# Patient Record
Sex: Female | Born: 1940 | Race: White | Hispanic: No | Smoking: Former smoker
Health system: Southern US, Community
[De-identification: ages and names within clinical notes are randomized; demographics above are authoritative.]

## PROBLEM LIST (undated history)

## (undated) DIAGNOSIS — E781 Pure hyperglyceridemia: Secondary | ICD-10-CM

## (undated) DIAGNOSIS — E119 Type 2 diabetes mellitus without complications: Secondary | ICD-10-CM

## (undated) DIAGNOSIS — I10 Essential (primary) hypertension: Secondary | ICD-10-CM

## (undated) DIAGNOSIS — I2510 Atherosclerotic heart disease of native coronary artery without angina pectoris: Secondary | ICD-10-CM

## (undated) DIAGNOSIS — J449 Chronic obstructive pulmonary disease, unspecified: Secondary | ICD-10-CM

## (undated) DIAGNOSIS — I219 Acute myocardial infarction, unspecified: Secondary | ICD-10-CM

## (undated) DIAGNOSIS — E785 Hyperlipidemia, unspecified: Secondary | ICD-10-CM

## (undated) DIAGNOSIS — I639 Cerebral infarction, unspecified: Secondary | ICD-10-CM

## (undated) HISTORY — PX: CHOLECYSTECTOMY: SHX55

## (undated) HISTORY — DX: Chronic obstructive pulmonary disease, unspecified: J44.9

## (undated) HISTORY — DX: Hyperlipidemia, unspecified: E78.5

## (undated) HISTORY — DX: Cerebral infarction, unspecified: I63.9

## (undated) HISTORY — PX: PARTIAL HYSTERECTOMY: SHX80

## (undated) HISTORY — PX: OTHER SURGICAL HISTORY: SHX169

## (undated) HISTORY — DX: Type 2 diabetes mellitus without complications: E11.9

## (undated) HISTORY — DX: Acute myocardial infarction, unspecified: I21.9

## (undated) HISTORY — DX: Pure hyperglyceridemia: E78.1

## (undated) HISTORY — PX: APPENDECTOMY: SHX54

## (undated) HISTORY — DX: Atherosclerotic heart disease of native coronary artery without angina pectoris: I25.10

## (undated) HISTORY — DX: Essential (primary) hypertension: I10

---

## 1969-08-30 HISTORY — PX: APPENDECTOMY: SHX54

## 1972-08-30 HISTORY — PX: TUBAL LIGATION: SHX77

## 1974-08-30 HISTORY — PX: PARTIAL HYSTERECTOMY: SHX80

## 1996-08-30 DIAGNOSIS — M109 Gout, unspecified: Secondary | ICD-10-CM | POA: Insufficient documentation

## 1996-08-30 HISTORY — DX: Gout, unspecified: M10.9

## 2005-08-30 HISTORY — PX: OTHER SURGICAL HISTORY: SHX169

## 2008-08-30 DIAGNOSIS — M1990 Unspecified osteoarthritis, unspecified site: Secondary | ICD-10-CM | POA: Insufficient documentation

## 2008-08-30 HISTORY — DX: Unspecified osteoarthritis, unspecified site: M19.90

## 2011-09-07 DIAGNOSIS — I2510 Atherosclerotic heart disease of native coronary artery without angina pectoris: Secondary | ICD-10-CM | POA: Diagnosis not present

## 2011-10-08 DIAGNOSIS — H2510 Age-related nuclear cataract, unspecified eye: Secondary | ICD-10-CM | POA: Diagnosis not present

## 2011-11-09 DIAGNOSIS — M109 Gout, unspecified: Secondary | ICD-10-CM | POA: Diagnosis not present

## 2011-11-09 DIAGNOSIS — IMO0001 Reserved for inherently not codable concepts without codable children: Secondary | ICD-10-CM | POA: Diagnosis not present

## 2011-11-09 DIAGNOSIS — I119 Hypertensive heart disease without heart failure: Secondary | ICD-10-CM | POA: Diagnosis not present

## 2011-11-09 DIAGNOSIS — I2510 Atherosclerotic heart disease of native coronary artery without angina pectoris: Secondary | ICD-10-CM | POA: Diagnosis not present

## 2011-11-09 DIAGNOSIS — E785 Hyperlipidemia, unspecified: Secondary | ICD-10-CM | POA: Diagnosis not present

## 2011-11-09 DIAGNOSIS — E119 Type 2 diabetes mellitus without complications: Secondary | ICD-10-CM | POA: Diagnosis not present

## 2011-11-09 DIAGNOSIS — I6529 Occlusion and stenosis of unspecified carotid artery: Secondary | ICD-10-CM | POA: Diagnosis not present

## 2011-11-09 DIAGNOSIS — I509 Heart failure, unspecified: Secondary | ICD-10-CM | POA: Diagnosis not present

## 2011-11-23 DIAGNOSIS — I119 Hypertensive heart disease without heart failure: Secondary | ICD-10-CM | POA: Diagnosis not present

## 2011-11-23 DIAGNOSIS — I509 Heart failure, unspecified: Secondary | ICD-10-CM | POA: Diagnosis not present

## 2011-11-23 DIAGNOSIS — I6529 Occlusion and stenosis of unspecified carotid artery: Secondary | ICD-10-CM | POA: Diagnosis not present

## 2011-11-23 DIAGNOSIS — I2510 Atherosclerotic heart disease of native coronary artery without angina pectoris: Secondary | ICD-10-CM | POA: Diagnosis not present

## 2011-11-23 DIAGNOSIS — J309 Allergic rhinitis, unspecified: Secondary | ICD-10-CM | POA: Diagnosis not present

## 2011-11-23 DIAGNOSIS — N39498 Other specified urinary incontinence: Secondary | ICD-10-CM | POA: Diagnosis not present

## 2011-12-15 DIAGNOSIS — K7689 Other specified diseases of liver: Secondary | ICD-10-CM | POA: Diagnosis not present

## 2011-12-15 DIAGNOSIS — I700 Atherosclerosis of aorta: Secondary | ICD-10-CM | POA: Diagnosis not present

## 2011-12-15 DIAGNOSIS — R160 Hepatomegaly, not elsewhere classified: Secondary | ICD-10-CM | POA: Diagnosis not present

## 2011-12-15 DIAGNOSIS — Z9089 Acquired absence of other organs: Secondary | ICD-10-CM | POA: Diagnosis not present

## 2012-01-25 DIAGNOSIS — E785 Hyperlipidemia, unspecified: Secondary | ICD-10-CM | POA: Diagnosis not present

## 2012-01-25 DIAGNOSIS — E119 Type 2 diabetes mellitus without complications: Secondary | ICD-10-CM | POA: Diagnosis not present

## 2012-01-25 DIAGNOSIS — E7800 Pure hypercholesterolemia, unspecified: Secondary | ICD-10-CM | POA: Diagnosis not present

## 2012-02-29 DIAGNOSIS — E7800 Pure hypercholesterolemia, unspecified: Secondary | ICD-10-CM | POA: Diagnosis not present

## 2012-02-29 DIAGNOSIS — E785 Hyperlipidemia, unspecified: Secondary | ICD-10-CM | POA: Diagnosis not present

## 2012-02-29 DIAGNOSIS — I119 Hypertensive heart disease without heart failure: Secondary | ICD-10-CM | POA: Diagnosis not present

## 2012-02-29 DIAGNOSIS — E119 Type 2 diabetes mellitus without complications: Secondary | ICD-10-CM | POA: Diagnosis not present

## 2012-02-29 DIAGNOSIS — I2510 Atherosclerotic heart disease of native coronary artery without angina pectoris: Secondary | ICD-10-CM | POA: Diagnosis not present

## 2012-02-29 DIAGNOSIS — R7309 Other abnormal glucose: Secondary | ICD-10-CM | POA: Diagnosis not present

## 2012-02-29 DIAGNOSIS — I509 Heart failure, unspecified: Secondary | ICD-10-CM | POA: Diagnosis not present

## 2012-02-29 DIAGNOSIS — E559 Vitamin D deficiency, unspecified: Secondary | ICD-10-CM | POA: Diagnosis not present

## 2012-02-29 DIAGNOSIS — I6529 Occlusion and stenosis of unspecified carotid artery: Secondary | ICD-10-CM | POA: Diagnosis not present

## 2012-03-14 DIAGNOSIS — I6529 Occlusion and stenosis of unspecified carotid artery: Secondary | ICD-10-CM | POA: Diagnosis not present

## 2012-03-14 DIAGNOSIS — I509 Heart failure, unspecified: Secondary | ICD-10-CM | POA: Diagnosis not present

## 2012-03-14 DIAGNOSIS — J309 Allergic rhinitis, unspecified: Secondary | ICD-10-CM | POA: Diagnosis not present

## 2012-03-14 DIAGNOSIS — N39498 Other specified urinary incontinence: Secondary | ICD-10-CM | POA: Diagnosis not present

## 2012-03-14 DIAGNOSIS — I2510 Atherosclerotic heart disease of native coronary artery without angina pectoris: Secondary | ICD-10-CM | POA: Diagnosis not present

## 2012-03-14 DIAGNOSIS — I119 Hypertensive heart disease without heart failure: Secondary | ICD-10-CM | POA: Diagnosis not present

## 2012-04-04 DIAGNOSIS — E781 Pure hyperglyceridemia: Secondary | ICD-10-CM | POA: Diagnosis not present

## 2012-05-11 DIAGNOSIS — M109 Gout, unspecified: Secondary | ICD-10-CM | POA: Diagnosis not present

## 2012-05-11 DIAGNOSIS — E559 Vitamin D deficiency, unspecified: Secondary | ICD-10-CM | POA: Diagnosis not present

## 2012-05-11 DIAGNOSIS — R7309 Other abnormal glucose: Secondary | ICD-10-CM | POA: Diagnosis not present

## 2012-07-10 DIAGNOSIS — E781 Pure hyperglyceridemia: Secondary | ICD-10-CM | POA: Diagnosis not present

## 2012-09-12 DIAGNOSIS — E785 Hyperlipidemia, unspecified: Secondary | ICD-10-CM | POA: Diagnosis not present

## 2012-09-12 DIAGNOSIS — E119 Type 2 diabetes mellitus without complications: Secondary | ICD-10-CM | POA: Diagnosis not present

## 2012-09-12 DIAGNOSIS — I6529 Occlusion and stenosis of unspecified carotid artery: Secondary | ICD-10-CM | POA: Diagnosis not present

## 2012-09-12 DIAGNOSIS — E559 Vitamin D deficiency, unspecified: Secondary | ICD-10-CM | POA: Diagnosis not present

## 2012-09-12 DIAGNOSIS — I509 Heart failure, unspecified: Secondary | ICD-10-CM | POA: Diagnosis not present

## 2012-09-12 DIAGNOSIS — K5730 Diverticulosis of large intestine without perforation or abscess without bleeding: Secondary | ICD-10-CM | POA: Diagnosis not present

## 2012-09-12 DIAGNOSIS — I2510 Atherosclerotic heart disease of native coronary artery without angina pectoris: Secondary | ICD-10-CM | POA: Diagnosis not present

## 2012-09-12 DIAGNOSIS — E7800 Pure hypercholesterolemia, unspecified: Secondary | ICD-10-CM | POA: Diagnosis not present

## 2012-10-20 DIAGNOSIS — H2510 Age-related nuclear cataract, unspecified eye: Secondary | ICD-10-CM | POA: Diagnosis not present

## 2012-11-07 DIAGNOSIS — I119 Hypertensive heart disease without heart failure: Secondary | ICD-10-CM | POA: Diagnosis not present

## 2012-11-07 DIAGNOSIS — I509 Heart failure, unspecified: Secondary | ICD-10-CM | POA: Diagnosis not present

## 2012-11-07 DIAGNOSIS — J309 Allergic rhinitis, unspecified: Secondary | ICD-10-CM | POA: Diagnosis not present

## 2012-11-07 DIAGNOSIS — N39498 Other specified urinary incontinence: Secondary | ICD-10-CM | POA: Diagnosis not present

## 2012-11-07 DIAGNOSIS — I2510 Atherosclerotic heart disease of native coronary artery without angina pectoris: Secondary | ICD-10-CM | POA: Diagnosis not present

## 2012-11-07 DIAGNOSIS — I6529 Occlusion and stenosis of unspecified carotid artery: Secondary | ICD-10-CM | POA: Diagnosis not present

## 2012-11-15 DIAGNOSIS — E781 Pure hyperglyceridemia: Secondary | ICD-10-CM | POA: Diagnosis not present

## 2013-02-13 DIAGNOSIS — E119 Type 2 diabetes mellitus without complications: Secondary | ICD-10-CM | POA: Diagnosis not present

## 2013-02-13 DIAGNOSIS — E559 Vitamin D deficiency, unspecified: Secondary | ICD-10-CM | POA: Diagnosis not present

## 2013-02-13 DIAGNOSIS — I119 Hypertensive heart disease without heart failure: Secondary | ICD-10-CM | POA: Diagnosis not present

## 2013-02-13 DIAGNOSIS — I509 Heart failure, unspecified: Secondary | ICD-10-CM | POA: Diagnosis not present

## 2013-02-13 DIAGNOSIS — M109 Gout, unspecified: Secondary | ICD-10-CM | POA: Diagnosis not present

## 2013-02-13 DIAGNOSIS — E7800 Pure hypercholesterolemia, unspecified: Secondary | ICD-10-CM | POA: Diagnosis not present

## 2013-02-27 DIAGNOSIS — I119 Hypertensive heart disease without heart failure: Secondary | ICD-10-CM | POA: Diagnosis not present

## 2013-02-27 DIAGNOSIS — I509 Heart failure, unspecified: Secondary | ICD-10-CM | POA: Diagnosis not present

## 2013-02-27 DIAGNOSIS — J309 Allergic rhinitis, unspecified: Secondary | ICD-10-CM | POA: Diagnosis not present

## 2013-02-27 DIAGNOSIS — I6529 Occlusion and stenosis of unspecified carotid artery: Secondary | ICD-10-CM | POA: Diagnosis not present

## 2013-02-27 DIAGNOSIS — M1990 Unspecified osteoarthritis, unspecified site: Secondary | ICD-10-CM | POA: Diagnosis not present

## 2013-02-27 DIAGNOSIS — I2510 Atherosclerotic heart disease of native coronary artery without angina pectoris: Secondary | ICD-10-CM | POA: Diagnosis not present

## 2013-03-16 DIAGNOSIS — E781 Pure hyperglyceridemia: Secondary | ICD-10-CM | POA: Diagnosis not present

## 2013-06-05 DIAGNOSIS — R7309 Other abnormal glucose: Secondary | ICD-10-CM | POA: Diagnosis not present

## 2013-06-05 DIAGNOSIS — E559 Vitamin D deficiency, unspecified: Secondary | ICD-10-CM | POA: Diagnosis not present

## 2013-06-05 DIAGNOSIS — E119 Type 2 diabetes mellitus without complications: Secondary | ICD-10-CM | POA: Diagnosis not present

## 2013-06-05 DIAGNOSIS — M109 Gout, unspecified: Secondary | ICD-10-CM | POA: Diagnosis not present

## 2013-06-05 DIAGNOSIS — I509 Heart failure, unspecified: Secondary | ICD-10-CM | POA: Diagnosis not present

## 2013-06-05 DIAGNOSIS — M899 Disorder of bone, unspecified: Secondary | ICD-10-CM | POA: Diagnosis not present

## 2013-06-05 DIAGNOSIS — I6529 Occlusion and stenosis of unspecified carotid artery: Secondary | ICD-10-CM | POA: Diagnosis not present

## 2013-06-05 DIAGNOSIS — E785 Hyperlipidemia, unspecified: Secondary | ICD-10-CM | POA: Diagnosis not present

## 2013-06-05 DIAGNOSIS — I2510 Atherosclerotic heart disease of native coronary artery without angina pectoris: Secondary | ICD-10-CM | POA: Diagnosis not present

## 2013-06-19 DIAGNOSIS — J309 Allergic rhinitis, unspecified: Secondary | ICD-10-CM | POA: Diagnosis not present

## 2013-06-19 DIAGNOSIS — I119 Hypertensive heart disease without heart failure: Secondary | ICD-10-CM | POA: Diagnosis not present

## 2013-06-19 DIAGNOSIS — Z23 Encounter for immunization: Secondary | ICD-10-CM | POA: Diagnosis not present

## 2013-06-19 DIAGNOSIS — I509 Heart failure, unspecified: Secondary | ICD-10-CM | POA: Diagnosis not present

## 2013-06-19 DIAGNOSIS — I2510 Atherosclerotic heart disease of native coronary artery without angina pectoris: Secondary | ICD-10-CM | POA: Diagnosis not present

## 2013-06-19 DIAGNOSIS — E785 Hyperlipidemia, unspecified: Secondary | ICD-10-CM | POA: Diagnosis not present

## 2013-06-19 DIAGNOSIS — I6529 Occlusion and stenosis of unspecified carotid artery: Secondary | ICD-10-CM | POA: Diagnosis not present

## 2013-06-19 DIAGNOSIS — R32 Unspecified urinary incontinence: Secondary | ICD-10-CM | POA: Diagnosis not present

## 2013-11-27 DIAGNOSIS — H5200 Hypermetropia, unspecified eye: Secondary | ICD-10-CM | POA: Diagnosis not present

## 2013-11-27 DIAGNOSIS — H524 Presbyopia: Secondary | ICD-10-CM | POA: Diagnosis not present

## 2013-11-27 DIAGNOSIS — E119 Type 2 diabetes mellitus without complications: Secondary | ICD-10-CM | POA: Diagnosis not present

## 2013-11-27 DIAGNOSIS — H52229 Regular astigmatism, unspecified eye: Secondary | ICD-10-CM | POA: Diagnosis not present

## 2013-11-28 DIAGNOSIS — I10 Essential (primary) hypertension: Secondary | ICD-10-CM | POA: Diagnosis not present

## 2013-11-28 DIAGNOSIS — F17200 Nicotine dependence, unspecified, uncomplicated: Secondary | ICD-10-CM | POA: Diagnosis not present

## 2013-11-28 DIAGNOSIS — E785 Hyperlipidemia, unspecified: Secondary | ICD-10-CM | POA: Diagnosis not present

## 2013-11-28 DIAGNOSIS — E119 Type 2 diabetes mellitus without complications: Secondary | ICD-10-CM | POA: Diagnosis not present

## 2013-11-28 DIAGNOSIS — I2510 Atherosclerotic heart disease of native coronary artery without angina pectoris: Secondary | ICD-10-CM | POA: Diagnosis not present

## 2014-02-27 DIAGNOSIS — F17200 Nicotine dependence, unspecified, uncomplicated: Secondary | ICD-10-CM | POA: Diagnosis not present

## 2014-02-27 DIAGNOSIS — E119 Type 2 diabetes mellitus without complications: Secondary | ICD-10-CM | POA: Diagnosis not present

## 2014-02-27 DIAGNOSIS — I10 Essential (primary) hypertension: Secondary | ICD-10-CM | POA: Diagnosis not present

## 2014-02-27 DIAGNOSIS — E785 Hyperlipidemia, unspecified: Secondary | ICD-10-CM | POA: Diagnosis not present

## 2014-06-04 DIAGNOSIS — I10 Essential (primary) hypertension: Secondary | ICD-10-CM | POA: Diagnosis not present

## 2014-06-04 DIAGNOSIS — L449 Papulosquamous disorder, unspecified: Secondary | ICD-10-CM | POA: Diagnosis not present

## 2014-06-04 DIAGNOSIS — R21 Rash and other nonspecific skin eruption: Secondary | ICD-10-CM | POA: Diagnosis not present

## 2014-06-04 DIAGNOSIS — L02419 Cutaneous abscess of limb, unspecified: Secondary | ICD-10-CM | POA: Diagnosis not present

## 2014-06-04 DIAGNOSIS — E119 Type 2 diabetes mellitus without complications: Secondary | ICD-10-CM | POA: Diagnosis not present

## 2014-06-04 DIAGNOSIS — Z23 Encounter for immunization: Secondary | ICD-10-CM | POA: Diagnosis not present

## 2014-06-18 DIAGNOSIS — I10 Essential (primary) hypertension: Secondary | ICD-10-CM | POA: Diagnosis not present

## 2014-06-18 DIAGNOSIS — L280 Lichen simplex chronicus: Secondary | ICD-10-CM | POA: Diagnosis not present

## 2014-07-15 DIAGNOSIS — I10 Essential (primary) hypertension: Secondary | ICD-10-CM | POA: Diagnosis not present

## 2014-09-04 DIAGNOSIS — E119 Type 2 diabetes mellitus without complications: Secondary | ICD-10-CM | POA: Diagnosis not present

## 2014-09-04 DIAGNOSIS — Z720 Tobacco use: Secondary | ICD-10-CM | POA: Diagnosis not present

## 2014-09-04 DIAGNOSIS — I10 Essential (primary) hypertension: Secondary | ICD-10-CM | POA: Diagnosis not present

## 2014-09-10 DIAGNOSIS — I10 Essential (primary) hypertension: Secondary | ICD-10-CM | POA: Diagnosis not present

## 2014-09-10 DIAGNOSIS — E785 Hyperlipidemia, unspecified: Secondary | ICD-10-CM | POA: Diagnosis not present

## 2014-12-04 DIAGNOSIS — E119 Type 2 diabetes mellitus without complications: Secondary | ICD-10-CM | POA: Diagnosis not present

## 2014-12-04 DIAGNOSIS — I10 Essential (primary) hypertension: Secondary | ICD-10-CM | POA: Diagnosis not present

## 2014-12-04 DIAGNOSIS — J309 Allergic rhinitis, unspecified: Secondary | ICD-10-CM | POA: Diagnosis not present

## 2014-12-04 DIAGNOSIS — R21 Rash and other nonspecific skin eruption: Secondary | ICD-10-CM | POA: Diagnosis not present

## 2015-03-12 DIAGNOSIS — E780 Pure hypercholesterolemia: Secondary | ICD-10-CM | POA: Diagnosis not present

## 2015-03-12 DIAGNOSIS — I10 Essential (primary) hypertension: Secondary | ICD-10-CM | POA: Diagnosis not present

## 2015-03-12 DIAGNOSIS — J449 Chronic obstructive pulmonary disease, unspecified: Secondary | ICD-10-CM | POA: Diagnosis not present

## 2015-03-12 DIAGNOSIS — E785 Hyperlipidemia, unspecified: Secondary | ICD-10-CM | POA: Diagnosis not present

## 2015-03-12 DIAGNOSIS — E119 Type 2 diabetes mellitus without complications: Secondary | ICD-10-CM | POA: Diagnosis not present

## 2015-03-17 DIAGNOSIS — E119 Type 2 diabetes mellitus without complications: Secondary | ICD-10-CM | POA: Diagnosis not present

## 2015-06-11 DIAGNOSIS — Z23 Encounter for immunization: Secondary | ICD-10-CM | POA: Diagnosis not present

## 2015-06-11 DIAGNOSIS — J309 Allergic rhinitis, unspecified: Secondary | ICD-10-CM | POA: Diagnosis not present

## 2015-06-11 DIAGNOSIS — E119 Type 2 diabetes mellitus without complications: Secondary | ICD-10-CM | POA: Diagnosis not present

## 2015-06-11 DIAGNOSIS — E785 Hyperlipidemia, unspecified: Secondary | ICD-10-CM | POA: Diagnosis not present

## 2015-06-11 DIAGNOSIS — I10 Essential (primary) hypertension: Secondary | ICD-10-CM | POA: Diagnosis not present

## 2015-06-11 DIAGNOSIS — Z720 Tobacco use: Secondary | ICD-10-CM | POA: Diagnosis not present

## 2015-06-17 DIAGNOSIS — J309 Allergic rhinitis, unspecified: Secondary | ICD-10-CM | POA: Diagnosis not present

## 2015-07-15 DIAGNOSIS — E785 Hyperlipidemia, unspecified: Secondary | ICD-10-CM | POA: Diagnosis not present

## 2015-07-15 DIAGNOSIS — I10 Essential (primary) hypertension: Secondary | ICD-10-CM | POA: Diagnosis not present

## 2015-07-15 DIAGNOSIS — Z87891 Personal history of nicotine dependence: Secondary | ICD-10-CM | POA: Diagnosis not present

## 2015-07-15 DIAGNOSIS — Z720 Tobacco use: Secondary | ICD-10-CM | POA: Diagnosis not present

## 2015-07-15 DIAGNOSIS — E119 Type 2 diabetes mellitus without complications: Secondary | ICD-10-CM | POA: Diagnosis not present

## 2015-07-15 DIAGNOSIS — J309 Allergic rhinitis, unspecified: Secondary | ICD-10-CM | POA: Diagnosis not present

## 2015-09-23 DIAGNOSIS — E119 Type 2 diabetes mellitus without complications: Secondary | ICD-10-CM | POA: Diagnosis not present

## 2015-09-23 DIAGNOSIS — E785 Hyperlipidemia, unspecified: Secondary | ICD-10-CM | POA: Diagnosis not present

## 2015-09-23 DIAGNOSIS — R160 Hepatomegaly, not elsewhere classified: Secondary | ICD-10-CM | POA: Diagnosis not present

## 2015-09-23 DIAGNOSIS — I10 Essential (primary) hypertension: Secondary | ICD-10-CM | POA: Diagnosis not present

## 2015-09-30 DIAGNOSIS — I700 Atherosclerosis of aorta: Secondary | ICD-10-CM | POA: Diagnosis not present

## 2015-09-30 DIAGNOSIS — K5730 Diverticulosis of large intestine without perforation or abscess without bleeding: Secondary | ICD-10-CM | POA: Diagnosis not present

## 2015-09-30 DIAGNOSIS — R935 Abnormal findings on diagnostic imaging of other abdominal regions, including retroperitoneum: Secondary | ICD-10-CM | POA: Diagnosis not present

## 2015-09-30 DIAGNOSIS — R160 Hepatomegaly, not elsewhere classified: Secondary | ICD-10-CM | POA: Diagnosis not present

## 2015-12-22 DIAGNOSIS — I2510 Atherosclerotic heart disease of native coronary artery without angina pectoris: Secondary | ICD-10-CM | POA: Diagnosis not present

## 2015-12-22 DIAGNOSIS — E119 Type 2 diabetes mellitus without complications: Secondary | ICD-10-CM | POA: Diagnosis not present

## 2015-12-22 DIAGNOSIS — E785 Hyperlipidemia, unspecified: Secondary | ICD-10-CM | POA: Diagnosis not present

## 2015-12-22 DIAGNOSIS — I10 Essential (primary) hypertension: Secondary | ICD-10-CM | POA: Diagnosis not present

## 2015-12-29 DIAGNOSIS — Z9114 Patient's other noncompliance with medication regimen: Secondary | ICD-10-CM | POA: Diagnosis not present

## 2016-03-17 DIAGNOSIS — E119 Type 2 diabetes mellitus without complications: Secondary | ICD-10-CM | POA: Diagnosis not present

## 2016-03-17 DIAGNOSIS — H524 Presbyopia: Secondary | ICD-10-CM | POA: Diagnosis not present

## 2016-03-17 DIAGNOSIS — H25813 Combined forms of age-related cataract, bilateral: Secondary | ICD-10-CM | POA: Diagnosis not present

## 2016-03-22 DIAGNOSIS — E119 Type 2 diabetes mellitus without complications: Secondary | ICD-10-CM | POA: Diagnosis not present

## 2016-03-22 DIAGNOSIS — M542 Cervicalgia: Secondary | ICD-10-CM | POA: Diagnosis not present

## 2016-03-22 DIAGNOSIS — M25512 Pain in left shoulder: Secondary | ICD-10-CM | POA: Diagnosis not present

## 2016-03-22 DIAGNOSIS — I10 Essential (primary) hypertension: Secondary | ICD-10-CM | POA: Diagnosis not present

## 2016-03-22 DIAGNOSIS — E785 Hyperlipidemia, unspecified: Secondary | ICD-10-CM | POA: Diagnosis not present

## 2016-03-23 DIAGNOSIS — M542 Cervicalgia: Secondary | ICD-10-CM | POA: Diagnosis not present

## 2016-03-23 DIAGNOSIS — M47812 Spondylosis without myelopathy or radiculopathy, cervical region: Secondary | ICD-10-CM | POA: Diagnosis not present

## 2016-04-23 ENCOUNTER — Other Ambulatory Visit: Payer: Self-pay

## 2016-06-22 DIAGNOSIS — Z794 Long term (current) use of insulin: Secondary | ICD-10-CM | POA: Diagnosis not present

## 2016-06-22 DIAGNOSIS — Z1389 Encounter for screening for other disorder: Secondary | ICD-10-CM | POA: Diagnosis not present

## 2016-06-22 DIAGNOSIS — I10 Essential (primary) hypertension: Secondary | ICD-10-CM | POA: Diagnosis not present

## 2016-06-22 DIAGNOSIS — E785 Hyperlipidemia, unspecified: Secondary | ICD-10-CM | POA: Diagnosis not present

## 2016-06-22 DIAGNOSIS — Z6830 Body mass index (BMI) 30.0-30.9, adult: Secondary | ICD-10-CM | POA: Diagnosis not present

## 2016-06-22 DIAGNOSIS — E790 Hyperuricemia without signs of inflammatory arthritis and tophaceous disease: Secondary | ICD-10-CM | POA: Diagnosis not present

## 2016-06-22 DIAGNOSIS — E119 Type 2 diabetes mellitus without complications: Secondary | ICD-10-CM | POA: Diagnosis not present

## 2016-06-22 DIAGNOSIS — Z23 Encounter for immunization: Secondary | ICD-10-CM | POA: Diagnosis not present

## 2016-06-22 DIAGNOSIS — M542 Cervicalgia: Secondary | ICD-10-CM | POA: Diagnosis not present

## 2016-09-22 DIAGNOSIS — E790 Hyperuricemia without signs of inflammatory arthritis and tophaceous disease: Secondary | ICD-10-CM | POA: Diagnosis not present

## 2016-09-22 DIAGNOSIS — I10 Essential (primary) hypertension: Secondary | ICD-10-CM | POA: Diagnosis not present

## 2016-09-22 DIAGNOSIS — E119 Type 2 diabetes mellitus without complications: Secondary | ICD-10-CM | POA: Diagnosis not present

## 2016-09-22 DIAGNOSIS — M542 Cervicalgia: Secondary | ICD-10-CM | POA: Diagnosis not present

## 2016-12-22 DIAGNOSIS — R600 Localized edema: Secondary | ICD-10-CM | POA: Diagnosis not present

## 2016-12-22 DIAGNOSIS — E790 Hyperuricemia without signs of inflammatory arthritis and tophaceous disease: Secondary | ICD-10-CM | POA: Diagnosis not present

## 2016-12-22 DIAGNOSIS — E785 Hyperlipidemia, unspecified: Secondary | ICD-10-CM | POA: Diagnosis not present

## 2016-12-22 DIAGNOSIS — E119 Type 2 diabetes mellitus without complications: Secondary | ICD-10-CM | POA: Diagnosis not present

## 2016-12-22 DIAGNOSIS — I10 Essential (primary) hypertension: Secondary | ICD-10-CM | POA: Diagnosis not present

## 2016-12-22 DIAGNOSIS — Z794 Long term (current) use of insulin: Secondary | ICD-10-CM | POA: Diagnosis not present

## 2016-12-29 DIAGNOSIS — R748 Abnormal levels of other serum enzymes: Secondary | ICD-10-CM | POA: Diagnosis not present

## 2017-03-22 DIAGNOSIS — H25813 Combined forms of age-related cataract, bilateral: Secondary | ICD-10-CM | POA: Diagnosis not present

## 2017-03-22 DIAGNOSIS — E119 Type 2 diabetes mellitus without complications: Secondary | ICD-10-CM | POA: Diagnosis not present

## 2017-03-28 DIAGNOSIS — I10 Essential (primary) hypertension: Secondary | ICD-10-CM | POA: Diagnosis not present

## 2017-03-28 DIAGNOSIS — E785 Hyperlipidemia, unspecified: Secondary | ICD-10-CM | POA: Diagnosis not present

## 2017-03-28 DIAGNOSIS — E119 Type 2 diabetes mellitus without complications: Secondary | ICD-10-CM | POA: Diagnosis not present

## 2017-03-28 DIAGNOSIS — M109 Gout, unspecified: Secondary | ICD-10-CM | POA: Diagnosis not present

## 2017-06-07 DIAGNOSIS — Z23 Encounter for immunization: Secondary | ICD-10-CM | POA: Diagnosis not present

## 2017-06-29 DIAGNOSIS — I10 Essential (primary) hypertension: Secondary | ICD-10-CM | POA: Diagnosis not present

## 2017-06-29 DIAGNOSIS — E119 Type 2 diabetes mellitus without complications: Secondary | ICD-10-CM | POA: Diagnosis not present

## 2017-06-29 DIAGNOSIS — J449 Chronic obstructive pulmonary disease, unspecified: Secondary | ICD-10-CM | POA: Diagnosis not present

## 2017-06-29 DIAGNOSIS — E785 Hyperlipidemia, unspecified: Secondary | ICD-10-CM | POA: Diagnosis not present

## 2017-06-29 DIAGNOSIS — Z794 Long term (current) use of insulin: Secondary | ICD-10-CM | POA: Diagnosis not present

## 2017-07-12 DIAGNOSIS — E781 Pure hyperglyceridemia: Secondary | ICD-10-CM | POA: Diagnosis not present

## 2017-09-30 DIAGNOSIS — J449 Chronic obstructive pulmonary disease, unspecified: Secondary | ICD-10-CM | POA: Diagnosis not present

## 2017-09-30 DIAGNOSIS — E785 Hyperlipidemia, unspecified: Secondary | ICD-10-CM | POA: Diagnosis not present

## 2017-09-30 DIAGNOSIS — Z720 Tobacco use: Secondary | ICD-10-CM | POA: Diagnosis not present

## 2017-09-30 DIAGNOSIS — E119 Type 2 diabetes mellitus without complications: Secondary | ICD-10-CM | POA: Diagnosis not present

## 2017-09-30 DIAGNOSIS — I10 Essential (primary) hypertension: Secondary | ICD-10-CM | POA: Diagnosis not present

## 2017-10-14 DIAGNOSIS — E119 Type 2 diabetes mellitus without complications: Secondary | ICD-10-CM | POA: Diagnosis not present

## 2017-12-28 DIAGNOSIS — Z720 Tobacco use: Secondary | ICD-10-CM | POA: Diagnosis not present

## 2017-12-28 DIAGNOSIS — Z6829 Body mass index (BMI) 29.0-29.9, adult: Secondary | ICD-10-CM | POA: Diagnosis not present

## 2017-12-28 DIAGNOSIS — M545 Low back pain: Secondary | ICD-10-CM | POA: Diagnosis not present

## 2017-12-28 DIAGNOSIS — I10 Essential (primary) hypertension: Secondary | ICD-10-CM | POA: Diagnosis not present

## 2017-12-28 DIAGNOSIS — J449 Chronic obstructive pulmonary disease, unspecified: Secondary | ICD-10-CM | POA: Diagnosis not present

## 2017-12-28 DIAGNOSIS — E119 Type 2 diabetes mellitus without complications: Secondary | ICD-10-CM | POA: Diagnosis not present

## 2017-12-28 DIAGNOSIS — Z1389 Encounter for screening for other disorder: Secondary | ICD-10-CM | POA: Diagnosis not present

## 2017-12-29 DIAGNOSIS — M5126 Other intervertebral disc displacement, lumbar region: Secondary | ICD-10-CM | POA: Diagnosis not present

## 2017-12-29 DIAGNOSIS — M48061 Spinal stenosis, lumbar region without neurogenic claudication: Secondary | ICD-10-CM | POA: Diagnosis not present

## 2017-12-29 DIAGNOSIS — M545 Low back pain: Secondary | ICD-10-CM | POA: Diagnosis not present

## 2017-12-29 DIAGNOSIS — R102 Pelvic and perineal pain: Secondary | ICD-10-CM | POA: Diagnosis not present

## 2018-01-16 DIAGNOSIS — I10 Essential (primary) hypertension: Secondary | ICD-10-CM | POA: Diagnosis not present

## 2018-01-16 DIAGNOSIS — S336XXD Sprain of sacroiliac joint, subsequent encounter: Secondary | ICD-10-CM | POA: Diagnosis not present

## 2018-02-01 DIAGNOSIS — E119 Type 2 diabetes mellitus without complications: Secondary | ICD-10-CM | POA: Insufficient documentation

## 2018-02-01 DIAGNOSIS — Z794 Long term (current) use of insulin: Secondary | ICD-10-CM | POA: Diagnosis not present

## 2018-02-01 DIAGNOSIS — M2140 Flat foot [pes planus] (acquired), unspecified foot: Secondary | ICD-10-CM

## 2018-02-01 DIAGNOSIS — E118 Type 2 diabetes mellitus with unspecified complications: Secondary | ICD-10-CM

## 2018-02-01 DIAGNOSIS — M2141 Flat foot [pes planus] (acquired), right foot: Secondary | ICD-10-CM | POA: Diagnosis not present

## 2018-02-01 DIAGNOSIS — M2142 Flat foot [pes planus] (acquired), left foot: Secondary | ICD-10-CM | POA: Diagnosis not present

## 2018-02-01 DIAGNOSIS — E1169 Type 2 diabetes mellitus with other specified complication: Secondary | ICD-10-CM

## 2018-02-01 DIAGNOSIS — M19071 Primary osteoarthritis, right ankle and foot: Secondary | ICD-10-CM

## 2018-02-01 DIAGNOSIS — M19072 Primary osteoarthritis, left ankle and foot: Secondary | ICD-10-CM | POA: Diagnosis not present

## 2018-02-01 HISTORY — DX: Type 2 diabetes mellitus with other specified complication: E11.69

## 2018-02-01 HISTORY — DX: Primary osteoarthritis, right ankle and foot: M19.071

## 2018-02-01 HISTORY — DX: Flat foot (pes planus) (acquired), unspecified foot: M21.40

## 2018-02-01 HISTORY — DX: Type 2 diabetes mellitus with unspecified complications: E11.8

## 2018-03-29 ENCOUNTER — Other Ambulatory Visit: Payer: Self-pay

## 2018-03-31 DIAGNOSIS — E785 Hyperlipidemia, unspecified: Secondary | ICD-10-CM | POA: Diagnosis not present

## 2018-03-31 DIAGNOSIS — I10 Essential (primary) hypertension: Secondary | ICD-10-CM | POA: Diagnosis not present

## 2018-03-31 DIAGNOSIS — E119 Type 2 diabetes mellitus without complications: Secondary | ICD-10-CM | POA: Diagnosis not present

## 2018-04-14 DIAGNOSIS — H25813 Combined forms of age-related cataract, bilateral: Secondary | ICD-10-CM | POA: Diagnosis not present

## 2018-04-14 DIAGNOSIS — H353111 Nonexudative age-related macular degeneration, right eye, early dry stage: Secondary | ICD-10-CM | POA: Diagnosis not present

## 2018-05-09 DIAGNOSIS — E785 Hyperlipidemia, unspecified: Secondary | ICD-10-CM | POA: Diagnosis not present

## 2018-05-09 DIAGNOSIS — Z23 Encounter for immunization: Secondary | ICD-10-CM | POA: Diagnosis not present

## 2018-05-09 DIAGNOSIS — Z794 Long term (current) use of insulin: Secondary | ICD-10-CM | POA: Diagnosis not present

## 2018-05-09 DIAGNOSIS — E119 Type 2 diabetes mellitus without complications: Secondary | ICD-10-CM | POA: Diagnosis not present

## 2018-05-09 DIAGNOSIS — I10 Essential (primary) hypertension: Secondary | ICD-10-CM | POA: Diagnosis not present

## 2018-05-16 DIAGNOSIS — R899 Unspecified abnormal finding in specimens from other organs, systems and tissues: Secondary | ICD-10-CM | POA: Diagnosis not present

## 2018-05-30 DIAGNOSIS — R899 Unspecified abnormal finding in specimens from other organs, systems and tissues: Secondary | ICD-10-CM | POA: Diagnosis not present

## 2018-07-03 DIAGNOSIS — E782 Mixed hyperlipidemia: Secondary | ICD-10-CM | POA: Diagnosis not present

## 2018-07-03 DIAGNOSIS — E1169 Type 2 diabetes mellitus with other specified complication: Secondary | ICD-10-CM | POA: Diagnosis not present

## 2018-07-03 DIAGNOSIS — I10 Essential (primary) hypertension: Secondary | ICD-10-CM | POA: Diagnosis not present

## 2018-10-04 DIAGNOSIS — E782 Mixed hyperlipidemia: Secondary | ICD-10-CM | POA: Diagnosis not present

## 2018-10-04 DIAGNOSIS — E1169 Type 2 diabetes mellitus with other specified complication: Secondary | ICD-10-CM | POA: Diagnosis not present

## 2018-10-04 DIAGNOSIS — I10 Essential (primary) hypertension: Secondary | ICD-10-CM | POA: Diagnosis not present

## 2018-10-04 DIAGNOSIS — E790 Hyperuricemia without signs of inflammatory arthritis and tophaceous disease: Secondary | ICD-10-CM | POA: Diagnosis not present

## 2018-10-04 DIAGNOSIS — M542 Cervicalgia: Secondary | ICD-10-CM | POA: Diagnosis not present

## 2019-01-09 DIAGNOSIS — E790 Hyperuricemia without signs of inflammatory arthritis and tophaceous disease: Secondary | ICD-10-CM | POA: Diagnosis not present

## 2019-01-09 DIAGNOSIS — M542 Cervicalgia: Secondary | ICD-10-CM | POA: Diagnosis not present

## 2019-01-09 DIAGNOSIS — E1169 Type 2 diabetes mellitus with other specified complication: Secondary | ICD-10-CM | POA: Diagnosis not present

## 2019-01-09 DIAGNOSIS — I10 Essential (primary) hypertension: Secondary | ICD-10-CM | POA: Diagnosis not present

## 2019-01-09 DIAGNOSIS — E782 Mixed hyperlipidemia: Secondary | ICD-10-CM | POA: Diagnosis not present

## 2019-03-13 DIAGNOSIS — R600 Localized edema: Secondary | ICD-10-CM | POA: Diagnosis not present

## 2019-04-13 DIAGNOSIS — E790 Hyperuricemia without signs of inflammatory arthritis and tophaceous disease: Secondary | ICD-10-CM | POA: Diagnosis not present

## 2019-04-13 DIAGNOSIS — Z1331 Encounter for screening for depression: Secondary | ICD-10-CM | POA: Diagnosis not present

## 2019-04-13 DIAGNOSIS — E1169 Type 2 diabetes mellitus with other specified complication: Secondary | ICD-10-CM | POA: Diagnosis not present

## 2019-04-13 DIAGNOSIS — E782 Mixed hyperlipidemia: Secondary | ICD-10-CM | POA: Diagnosis not present

## 2019-04-13 DIAGNOSIS — I10 Essential (primary) hypertension: Secondary | ICD-10-CM | POA: Diagnosis not present

## 2019-06-22 DIAGNOSIS — Z23 Encounter for immunization: Secondary | ICD-10-CM | POA: Diagnosis not present

## 2019-07-17 DIAGNOSIS — N1832 Chronic kidney disease, stage 3b: Secondary | ICD-10-CM | POA: Diagnosis not present

## 2019-07-17 DIAGNOSIS — E1169 Type 2 diabetes mellitus with other specified complication: Secondary | ICD-10-CM | POA: Diagnosis not present

## 2019-07-17 DIAGNOSIS — E790 Hyperuricemia without signs of inflammatory arthritis and tophaceous disease: Secondary | ICD-10-CM | POA: Diagnosis not present

## 2019-07-17 DIAGNOSIS — E782 Mixed hyperlipidemia: Secondary | ICD-10-CM | POA: Diagnosis not present

## 2019-07-17 DIAGNOSIS — I10 Essential (primary) hypertension: Secondary | ICD-10-CM | POA: Diagnosis not present

## 2019-07-31 DIAGNOSIS — N183 Chronic kidney disease, stage 3 unspecified: Secondary | ICD-10-CM | POA: Diagnosis not present

## 2019-07-31 DIAGNOSIS — N1832 Chronic kidney disease, stage 3b: Secondary | ICD-10-CM | POA: Diagnosis not present

## 2019-09-05 DIAGNOSIS — E1169 Type 2 diabetes mellitus with other specified complication: Secondary | ICD-10-CM | POA: Diagnosis not present

## 2019-09-05 DIAGNOSIS — I10 Essential (primary) hypertension: Secondary | ICD-10-CM | POA: Diagnosis not present

## 2019-09-05 DIAGNOSIS — N1832 Chronic kidney disease, stage 3b: Secondary | ICD-10-CM | POA: Diagnosis not present

## 2019-09-05 DIAGNOSIS — J42 Unspecified chronic bronchitis: Secondary | ICD-10-CM | POA: Diagnosis not present

## 2019-09-05 DIAGNOSIS — E782 Mixed hyperlipidemia: Secondary | ICD-10-CM | POA: Diagnosis not present

## 2019-09-05 DIAGNOSIS — Z6826 Body mass index (BMI) 26.0-26.9, adult: Secondary | ICD-10-CM | POA: Diagnosis not present

## 2019-09-05 DIAGNOSIS — I2510 Atherosclerotic heart disease of native coronary artery without angina pectoris: Secondary | ICD-10-CM | POA: Diagnosis not present

## 2019-09-05 DIAGNOSIS — Z1389 Encounter for screening for other disorder: Secondary | ICD-10-CM | POA: Diagnosis not present

## 2019-09-05 DIAGNOSIS — M461 Sacroiliitis, not elsewhere classified: Secondary | ICD-10-CM | POA: Diagnosis not present

## 2019-12-05 DIAGNOSIS — E790 Hyperuricemia without signs of inflammatory arthritis and tophaceous disease: Secondary | ICD-10-CM | POA: Diagnosis not present

## 2019-12-05 DIAGNOSIS — G8929 Other chronic pain: Secondary | ICD-10-CM | POA: Diagnosis not present

## 2019-12-05 DIAGNOSIS — I10 Essential (primary) hypertension: Secondary | ICD-10-CM | POA: Diagnosis not present

## 2019-12-05 DIAGNOSIS — E782 Mixed hyperlipidemia: Secondary | ICD-10-CM | POA: Diagnosis not present

## 2019-12-05 DIAGNOSIS — E1169 Type 2 diabetes mellitus with other specified complication: Secondary | ICD-10-CM | POA: Diagnosis not present

## 2019-12-05 DIAGNOSIS — N1832 Chronic kidney disease, stage 3b: Secondary | ICD-10-CM | POA: Diagnosis not present

## 2019-12-05 DIAGNOSIS — M25562 Pain in left knee: Secondary | ICD-10-CM | POA: Diagnosis not present

## 2020-06-09 DIAGNOSIS — E1169 Type 2 diabetes mellitus with other specified complication: Secondary | ICD-10-CM | POA: Diagnosis not present

## 2020-06-09 DIAGNOSIS — I10 Essential (primary) hypertension: Secondary | ICD-10-CM | POA: Diagnosis not present

## 2020-06-09 DIAGNOSIS — E782 Mixed hyperlipidemia: Secondary | ICD-10-CM | POA: Diagnosis not present

## 2020-06-09 DIAGNOSIS — J449 Chronic obstructive pulmonary disease, unspecified: Secondary | ICD-10-CM | POA: Diagnosis not present

## 2020-06-09 DIAGNOSIS — Z0000 Encounter for general adult medical examination without abnormal findings: Secondary | ICD-10-CM | POA: Diagnosis not present

## 2020-06-09 DIAGNOSIS — Z6823 Body mass index (BMI) 23.0-23.9, adult: Secondary | ICD-10-CM | POA: Diagnosis not present

## 2020-06-09 DIAGNOSIS — M25562 Pain in left knee: Secondary | ICD-10-CM | POA: Diagnosis not present

## 2020-06-09 DIAGNOSIS — G8929 Other chronic pain: Secondary | ICD-10-CM | POA: Diagnosis not present

## 2020-06-09 DIAGNOSIS — Z23 Encounter for immunization: Secondary | ICD-10-CM | POA: Diagnosis not present

## 2020-07-21 DIAGNOSIS — M25562 Pain in left knee: Secondary | ICD-10-CM | POA: Diagnosis not present

## 2020-07-21 DIAGNOSIS — G8929 Other chronic pain: Secondary | ICD-10-CM | POA: Diagnosis not present

## 2020-09-10 DIAGNOSIS — M25562 Pain in left knee: Secondary | ICD-10-CM | POA: Diagnosis not present

## 2020-09-10 DIAGNOSIS — G8929 Other chronic pain: Secondary | ICD-10-CM | POA: Diagnosis not present

## 2020-09-10 DIAGNOSIS — E1169 Type 2 diabetes mellitus with other specified complication: Secondary | ICD-10-CM | POA: Diagnosis not present

## 2020-09-10 DIAGNOSIS — M25561 Pain in right knee: Secondary | ICD-10-CM | POA: Diagnosis not present

## 2020-09-10 DIAGNOSIS — I10 Essential (primary) hypertension: Secondary | ICD-10-CM | POA: Diagnosis not present

## 2020-09-10 DIAGNOSIS — Z1331 Encounter for screening for depression: Secondary | ICD-10-CM | POA: Diagnosis not present

## 2020-09-10 DIAGNOSIS — E782 Mixed hyperlipidemia: Secondary | ICD-10-CM | POA: Diagnosis not present

## 2020-09-24 DIAGNOSIS — R945 Abnormal results of liver function studies: Secondary | ICD-10-CM | POA: Diagnosis not present

## 2020-12-19 DIAGNOSIS — I10 Essential (primary) hypertension: Secondary | ICD-10-CM | POA: Diagnosis not present

## 2020-12-19 DIAGNOSIS — E782 Mixed hyperlipidemia: Secondary | ICD-10-CM | POA: Diagnosis not present

## 2020-12-19 DIAGNOSIS — M25561 Pain in right knee: Secondary | ICD-10-CM | POA: Diagnosis not present

## 2020-12-19 DIAGNOSIS — M25562 Pain in left knee: Secondary | ICD-10-CM | POA: Diagnosis not present

## 2020-12-19 DIAGNOSIS — G8929 Other chronic pain: Secondary | ICD-10-CM | POA: Diagnosis not present

## 2020-12-19 DIAGNOSIS — E1169 Type 2 diabetes mellitus with other specified complication: Secondary | ICD-10-CM | POA: Diagnosis not present

## 2020-12-19 DIAGNOSIS — N179 Acute kidney failure, unspecified: Secondary | ICD-10-CM | POA: Diagnosis not present

## 2021-02-27 DIAGNOSIS — M1712 Unilateral primary osteoarthritis, left knee: Secondary | ICD-10-CM | POA: Diagnosis not present

## 2021-03-30 DIAGNOSIS — E1169 Type 2 diabetes mellitus with other specified complication: Secondary | ICD-10-CM | POA: Diagnosis not present

## 2021-03-30 DIAGNOSIS — I10 Essential (primary) hypertension: Secondary | ICD-10-CM | POA: Diagnosis not present

## 2021-03-30 DIAGNOSIS — E782 Mixed hyperlipidemia: Secondary | ICD-10-CM | POA: Diagnosis not present

## 2021-03-30 DIAGNOSIS — N1832 Chronic kidney disease, stage 3b: Secondary | ICD-10-CM | POA: Diagnosis not present

## 2021-06-05 DIAGNOSIS — M1712 Unilateral primary osteoarthritis, left knee: Secondary | ICD-10-CM | POA: Diagnosis not present

## 2021-07-01 DIAGNOSIS — E119 Type 2 diabetes mellitus without complications: Secondary | ICD-10-CM | POA: Diagnosis not present

## 2021-07-01 DIAGNOSIS — E1165 Type 2 diabetes mellitus with hyperglycemia: Secondary | ICD-10-CM | POA: Diagnosis not present

## 2021-07-01 DIAGNOSIS — E782 Mixed hyperlipidemia: Secondary | ICD-10-CM | POA: Diagnosis not present

## 2021-07-01 DIAGNOSIS — E1169 Type 2 diabetes mellitus with other specified complication: Secondary | ICD-10-CM | POA: Diagnosis not present

## 2021-07-01 DIAGNOSIS — I10 Essential (primary) hypertension: Secondary | ICD-10-CM | POA: Diagnosis not present

## 2021-07-01 DIAGNOSIS — Z23 Encounter for immunization: Secondary | ICD-10-CM | POA: Diagnosis not present

## 2021-09-17 DIAGNOSIS — H25813 Combined forms of age-related cataract, bilateral: Secondary | ICD-10-CM | POA: Diagnosis not present

## 2021-09-17 DIAGNOSIS — E119 Type 2 diabetes mellitus without complications: Secondary | ICD-10-CM | POA: Diagnosis not present

## 2021-09-18 DIAGNOSIS — M1712 Unilateral primary osteoarthritis, left knee: Secondary | ICD-10-CM | POA: Diagnosis not present

## 2021-10-07 DIAGNOSIS — E1165 Type 2 diabetes mellitus with hyperglycemia: Secondary | ICD-10-CM | POA: Diagnosis not present

## 2021-10-07 DIAGNOSIS — J449 Chronic obstructive pulmonary disease, unspecified: Secondary | ICD-10-CM | POA: Diagnosis not present

## 2021-10-07 DIAGNOSIS — E782 Mixed hyperlipidemia: Secondary | ICD-10-CM | POA: Diagnosis not present

## 2021-11-19 DIAGNOSIS — M1712 Unilateral primary osteoarthritis, left knee: Secondary | ICD-10-CM | POA: Diagnosis not present

## 2021-11-30 DIAGNOSIS — J439 Emphysema, unspecified: Secondary | ICD-10-CM | POA: Diagnosis not present

## 2021-11-30 DIAGNOSIS — Z79899 Other long term (current) drug therapy: Secondary | ICD-10-CM | POA: Diagnosis not present

## 2021-11-30 DIAGNOSIS — Z01818 Encounter for other preprocedural examination: Secondary | ICD-10-CM | POA: Diagnosis not present

## 2021-11-30 DIAGNOSIS — R0600 Dyspnea, unspecified: Secondary | ICD-10-CM | POA: Diagnosis not present

## 2021-11-30 DIAGNOSIS — M79609 Pain in unspecified limb: Secondary | ICD-10-CM | POA: Diagnosis not present

## 2021-11-30 DIAGNOSIS — E559 Vitamin D deficiency, unspecified: Secondary | ICD-10-CM | POA: Diagnosis not present

## 2021-12-01 DIAGNOSIS — R600 Localized edema: Secondary | ICD-10-CM | POA: Diagnosis not present

## 2021-12-01 DIAGNOSIS — I252 Old myocardial infarction: Secondary | ICD-10-CM | POA: Diagnosis not present

## 2021-12-01 DIAGNOSIS — I440 Atrioventricular block, first degree: Secondary | ICD-10-CM | POA: Diagnosis not present

## 2021-12-01 DIAGNOSIS — M25562 Pain in left knee: Secondary | ICD-10-CM | POA: Diagnosis not present

## 2021-12-01 DIAGNOSIS — Z955 Presence of coronary angioplasty implant and graft: Secondary | ICD-10-CM | POA: Diagnosis not present

## 2021-12-04 ENCOUNTER — Telehealth: Payer: Self-pay

## 2021-12-04 NOTE — Telephone Encounter (Signed)
Received a message from Dr. Bing Matter to schedule an appointment for this patient next week. An appointmentr weas scheduled for 12/10/21 at 1:00 pm. Patient is aware. ?

## 2021-12-10 ENCOUNTER — Encounter: Payer: Self-pay | Admitting: Cardiology

## 2021-12-10 ENCOUNTER — Ambulatory Visit (INDEPENDENT_AMBULATORY_CARE_PROVIDER_SITE_OTHER): Payer: Medicare Other | Admitting: Cardiology

## 2021-12-10 VITALS — BP 126/64 | HR 60 | Ht 66.0 in | Wt 178.6 lb

## 2021-12-10 DIAGNOSIS — Z01810 Encounter for preprocedural cardiovascular examination: Secondary | ICD-10-CM | POA: Diagnosis not present

## 2021-12-10 DIAGNOSIS — R0609 Other forms of dyspnea: Secondary | ICD-10-CM

## 2021-12-10 DIAGNOSIS — F17200 Nicotine dependence, unspecified, uncomplicated: Secondary | ICD-10-CM

## 2021-12-10 DIAGNOSIS — I509 Heart failure, unspecified: Secondary | ICD-10-CM | POA: Diagnosis not present

## 2021-12-10 DIAGNOSIS — E785 Hyperlipidemia, unspecified: Secondary | ICD-10-CM

## 2021-12-10 DIAGNOSIS — I2510 Atherosclerotic heart disease of native coronary artery without angina pectoris: Secondary | ICD-10-CM

## 2021-12-10 DIAGNOSIS — E118 Type 2 diabetes mellitus with unspecified complications: Secondary | ICD-10-CM | POA: Diagnosis not present

## 2021-12-10 DIAGNOSIS — IMO0001 Reserved for inherently not codable concepts without codable children: Secondary | ICD-10-CM

## 2021-12-10 HISTORY — DX: Atherosclerotic heart disease of native coronary artery without angina pectoris: I25.10

## 2021-12-10 HISTORY — DX: Nicotine dependence, unspecified, uncomplicated: F17.200

## 2021-12-10 HISTORY — DX: Hyperlipidemia, unspecified: E78.5

## 2021-12-10 HISTORY — DX: Encounter for preprocedural cardiovascular examination: Z01.810

## 2021-12-10 HISTORY — DX: Heart failure, unspecified: I50.9

## 2021-12-10 NOTE — Patient Instructions (Signed)
Medication Instructions:  ?Your physician has recommended you make the following change in your medication: Take an extra dose of Lasix today.  Based on Lab results from today you may change the dose after we talk. ?*If you need a refill on your cardiac medications before your next appointment, please call your pharmacy* ? ? ?Lab Work: ?Your physician recommends that you have a BMP, ProBNP today  ? ?If you have labs (blood work) drawn today and your tests are completely normal, you will receive your results only by: ?MyChart Message (if you have MyChart) OR ?A paper copy in the mail ?If you have any lab test that is abnormal or we need to change your treatment, we will call you to review the results. ? ? ?Testing/Procedures: ?Your physician has requested that you have an echocardiogram.  ?Echocardiography is a painless test that uses sound waves to create images of your heart. It provides your doctor with information about the size and shape of your heart and how well your heart?s chambers and valves are working. This procedure takes approximately one hour. There are no restrictions for this procedure.  ? ? ?Follow-Up: ?At [facility], you and your health needs are our priority.  As part of our continuing mission to provide you with exceptional heart care, we have created designated Provider Care Teams.  These Care Teams include your primary Cardiologist (physician) and Advanced Practice Providers (APPs -  Physician Assistants and Nurse Practitioners) who all work together to provide you with the care you need, when you need it. ? ?We recommend signing up for the patient portal called "MyChart".  Sign up information is provided on this After Visit Summary.  MyChart is used to connect with patients for Virtual Visits (Telemedicine).  Patients are able to view lab/test results, encounter notes, upcoming appointments, etc.  Non-urgent messages can be sent to your provider as well.   ?To learn more about what you can  do with MyChart, go to [email or web address].   ? ?Your next appointment:   ?3 week(s) ? ?The format for your next appointment:   ?In Person ? ?Provider:   ?Gypsy Balsam, MD  ? ? ?Other Instructions ?None ? ?Important Information About Sugar ? ? ? ? ?  ?

## 2021-12-10 NOTE — Progress Notes (Signed)
? ?Cardiology Consultation:   ? ?Date:  12/10/2021  ? ?ID:  Angela Lucero, DOB 11/30/1940, MRN [id number] ? ?PCP:  Eloisa NorthernAmin, Saad, MD  ?Cardiologist:  Gypsy Balsamobert Corrissa Martello, MD  ? ?Referring MD: Harlen LabsMawoneke, Jacqueline, NP  ? ?Chief Complaint  ?Patient presents with  ? Clearance TBD  ?  TKR L knee Dr. Loralie Champagneurrani   ? ? ?History of Present Illness:   ? ?Angela Lucero is a 81 y.o. female who is being seen today for the evaluation of evaluation before knee surgery at the request of Harlen LabsMawoneke, Jacqueline, NP.  Very complex past medical history.  She does have history of coronary artery disease with PTCA and stenting in 1999 as well as 2005 that was done in face of acute myocardial infarction.  [place]  She does not know details about those events she does not sure if this is 1 artery in multiple arteries.  She also tell me he got history of congestive heart failure however she does not know ejection fraction.  Also history of diabetes mellitus, dyslipidemia, COPD, she smokes.  She is scheduled to have left knee replacement surgery she was asked to be evaluated before that surgery.  Overall cardiac wise she is doing fair.  She is very short of breath.  Had effort will bring shortness of breath.  She also got significant swelling of lower extremities.  She used to take 40 mg of Lasix twice daily however recently that medication has been reduced secondary to kidney dysfunction.  Swelling is unbearable.  She wakes up at least once during the night to go and urinate however she does not want to wake up in the middle of the night because of dyspnea on exertion, she does not have paroxysmal nocturnal dyspnea.  Denies have any chest pain tightness squeezing pressure mid chest but ability to exercise is very limited secondary to knee pain as well as shortness of breath.  She actually have her neighbor who is bringing her groceries. ?Still continues to smoke smokes at least 1 pack/day.  Asking about potentially quitting she told me  absolutely not she never got to do that. ?Does not exercise on the regular basis, she is not on any special diet ? ?Past Medical History:  ?Diagnosis Date  ? COPD (chronic obstructive pulmonary disease) (HCC)   ? Diabetes (HCC)   ? Heart attack (HCC)   ? [id number]  ? Hypertension   ? ? ?Past Surgical History:  ?Procedure Laterality Date  ? APPENDECTOMY    ? CHOLECYSTECTOMY    ? Navel hernia repair    ? PARTIAL HYSTERECTOMY    ? Stent Heart    ? Ulna nerve transpost L arm    ? ? ?Current Medications: ?Current Meds  ?Medication Sig  ? acetaminophen (TYLENOL) 500 MG tablet Take 500 mg by mouth every 6 (six) hours as needed for mild pain.  ? albuterol (VENTOLIN HFA) 108 (90 Base) MCG/ACT inhaler Inhale 2 puffs into the lungs every 6 (six) hours as needed for wheezing or shortness of breath.  ? aspirin EC 81 MG tablet Take 81 mg by mouth daily. Swallow whole.  ? atorvastatin (LIPITOR) 80 MG tablet Take 80 mg by mouth daily.  ? Cetirizine-Pseudoephedrine (ALLERGY RELIEF D PO) Take 1 tablet by mouth daily.  ? Cholecalciferol (VITAMIN D3) 250 MCG (10000 UT) TABS Take 1 tablet by mouth in the morning and at bedtime.  ? empagliflozin (JARDIANCE) 25 MG TABS tablet Take 25 mg by mouth daily.  ?  fenofibrate 160 MG tablet Take 160 mg by mouth daily.  ? furosemide (LASIX) 40 MG tablet Take 40 mg by mouth daily.  ? gabapentin (NEURONTIN) 300 MG capsule Take 300 mg by mouth 2 (two) times daily.  ? glimepiride (AMARYL) 2 MG tablet Take 2 mg by mouth daily with breakfast.  ? hydrochlorothiazide (HYDRODIURIL) 25 MG tablet Take 25 mg by mouth daily.  ? meloxicam (MOBIC) 7.5 MG tablet Take 7.5 mg by mouth daily.  ? Multiple Vitamin (MULTIVITAMIN ADULT PO) Take 1 tablet by mouth daily.  ? nebivolol (BYSTOLIC) 10 MG tablet Take 20 mg by mouth daily.  ? Omega 3 1000 MG CAPS Take 2 capsules by mouth in the morning and at bedtime.  ? tiotropium (SPIRIVA) 18 MCG inhalation capsule Place 18 mcg into inhaler and inhale daily.  ? traMADol  (ULTRAM) 50 MG tablet Take 50 mg by mouth every 6 (six) hours as needed for moderate pain.  ?  ? ?Allergies:   Patient has no known allergies.  ? ?Social History  ? ?Socioeconomic History  ? Marital status: Widowed  ?  Spouse name: Not on file  ? Number of children: Not on file  ? Years of education: Not on file  ? Highest education level: Not on file  ?Occupational History  ? Not on file  ?Tobacco Use  ? Smoking status: Never  ? Smokeless tobacco: Never  ?Substance and Sexual Activity  ? Alcohol use: Not Currently  ? Drug use: Never  ? Sexual activity: Not Currently  ?Other Topics Concern  ? Not on file  ?Social History Narrative  ? Not on file  ? ?Social Determinants of Health  ? ?Financial Resource Strain: Not on file  ?Food Insecurity: Not on file  ?Transportation Needs: Not on file  ?Physical Activity: Not on file  ?Stress: Not on file  ?Social Connections: Not on file  ?  ? ?Family History: ?The patient's family history includes Heart disease in her father and mother. ?ROS:   ?Please see the history of present illness.    ?All 14 point review of systems negative except as described per history of present illness. ? ?EKGs/Labs/Other Studies Reviewed:   ? ?The following studies were reviewed today: ? ? ?EKG:  EKG is  ordered today.  The ekg ordered today demonstrates normal sinus rhythm first-degree AV block right bundle branch block left anterior hemiblock cannot rule out septal MI. ? ?Recent Labs: ?No results found for requested labs within last 8760 hours.  ?Recent Lipid Panel ?No results found for: CHOL, TRIG, HDL, CHOLHDL, VLDL, LDLCALC, LDLDIRECT ? ?Physical Exam:   ? ?VS:  BP 126/64 (BP Location: Left Arm, Patient Position: Sitting)   Pulse 60   Ht 5\' 6"  (1.676 m)   Wt 178 lb 9.6 oz (81 kg)   SpO2 96%   BMI 28.83 kg/m?    ? ?Wt Readings from Last 3 Encounters:  ?12/10/21 178 lb 9.6 oz (81 kg)  ?  ? ?GEN:  Well nourished, well developed in no acute distress ?HEENT: Normal ?NECK: No JVD; No carotid  bruits ?LYMPHATICS: No lymphadenopathy ?CARDIAC: RRR, no murmurs, no rubs, no gallops, tones are very distant ?RESPIRATORY: Poor entry bilaterally with multiple rhonchi ?ABDOMEN: Soft, non-tender, non-distended ?MUSCULOSKELETAL: 2+ pitting edema; No deformity  ?SKIN: Warm and dry ?NEUROLOGIC:  Alert and oriented x 3 ?PSYCHIATRIC:  Normal affect  ? ?ASSESSMENT:   ? ?1. Type 2 diabetes mellitus with complication, without long-term current use of insulin (HCC)   ?  2. Coronary artery disease involving native coronary artery of native heart without angina pectoris   ?3. Congestive heart failure, unspecified HF chronicity, unspecified heart failure type (HCC)   ?4. Smoking   ?5. Dyslipidemia   ?6. Preop cardiovascular exam   ? ?PLAN:   ? ?In order of problems listed above: ? ?Coronary artery disease by history with myocardial infarction 1997 2005, EKG showed possibility of anteroseptal wall MI.  I will ask her to have an echocardiogram done to assess left ventricle ejection fraction.  She is already on antiplatelet therapy of her of aspirin which I will continue. ?Dyslipidemia: She is on a statin with Lipitor 80 mg daily however I did review K PN which show me data from general of last year with LDL of 78 HDL 29.  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  Echocardiogram will be done to clarify that.  She clearly looks very swollen.  I will ask her to take extra Lasix today 40 mg so she will be taking 20 twice daily today we will get Chem-7 and based on that we will decide tomorrow what medications to put her on to help improve the situation. ?Diabetes mellitus that being followed by internal medicine team.  I do see hemoglobin A1c from November with she is 8.  She will need to be better  controlled ?Smoking obviously a significant problem she told me straight that she is not interested in quitting obviously if she does not want to do it I do not think there is anything I can do to help with quitting smoking hopefully

## 2021-12-11 LAB — BASIC METABOLIC PANEL
BUN/Creatinine Ratio: 19 (ref 12–28)
BUN: 31 mg/dL — ABNORMAL HIGH (ref 8–27)
CO2: 22 mmol/L (ref 20–29)
Calcium: 9.6 mg/dL (ref 8.7–10.3)
Chloride: 98 mmol/L (ref 96–106)
Creatinine, Ser: 1.64 mg/dL — ABNORMAL HIGH (ref 0.57–1.00)
Glucose: 157 mg/dL — ABNORMAL HIGH (ref 70–99)
Potassium: 4.7 mmol/L (ref 3.5–5.2)
Sodium: 137 mmol/L (ref 134–144)
eGFR: 32 mL/min/{1.73_m2} — ABNORMAL LOW (ref >59)

## 2021-12-11 LAB — PRO B NATRIURETIC PEPTIDE: NT-Pro BNP: 5218 pg/mL — ABNORMAL HIGH (ref 0–738)

## 2021-12-13 DIAGNOSIS — N189 Chronic kidney disease, unspecified: Secondary | ICD-10-CM | POA: Diagnosis not present

## 2021-12-13 DIAGNOSIS — R0689 Other abnormalities of breathing: Secondary | ICD-10-CM | POA: Diagnosis not present

## 2021-12-13 DIAGNOSIS — R079 Chest pain, unspecified: Secondary | ICD-10-CM | POA: Diagnosis not present

## 2021-12-13 DIAGNOSIS — R0789 Other chest pain: Secondary | ICD-10-CM | POA: Diagnosis not present

## 2021-12-13 DIAGNOSIS — M542 Cervicalgia: Secondary | ICD-10-CM | POA: Diagnosis not present

## 2021-12-13 DIAGNOSIS — R0600 Dyspnea, unspecified: Secondary | ICD-10-CM | POA: Diagnosis not present

## 2021-12-13 DIAGNOSIS — I214 Non-ST elevation (NSTEMI) myocardial infarction: Secondary | ICD-10-CM | POA: Diagnosis not present

## 2021-12-13 DIAGNOSIS — N390 Urinary tract infection, site not specified: Secondary | ICD-10-CM | POA: Diagnosis not present

## 2021-12-13 DIAGNOSIS — I340 Nonrheumatic mitral (valve) insufficiency: Secondary | ICD-10-CM | POA: Diagnosis not present

## 2021-12-13 DIAGNOSIS — I130 Hypertensive heart and chronic kidney disease with heart failure and stage 1 through stage 4 chronic kidney disease, or unspecified chronic kidney disease: Secondary | ICD-10-CM | POA: Diagnosis not present

## 2021-12-13 DIAGNOSIS — I129 Hypertensive chronic kidney disease with stage 1 through stage 4 chronic kidney disease, or unspecified chronic kidney disease: Secondary | ICD-10-CM | POA: Diagnosis not present

## 2021-12-13 DIAGNOSIS — I249 Acute ischemic heart disease, unspecified: Secondary | ICD-10-CM | POA: Diagnosis not present

## 2021-12-13 DIAGNOSIS — I700 Atherosclerosis of aorta: Secondary | ICD-10-CM | POA: Diagnosis not present

## 2021-12-13 DIAGNOSIS — I517 Cardiomegaly: Secondary | ICD-10-CM | POA: Diagnosis not present

## 2021-12-13 DIAGNOSIS — I440 Atrioventricular block, first degree: Secondary | ICD-10-CM | POA: Diagnosis not present

## 2021-12-14 ENCOUNTER — Inpatient Hospital Stay (HOSPITAL_COMMUNITY)
Admission: EM | Admit: 2021-12-14 | Discharge: 2021-12-18 | DRG: 281 | Disposition: A | Discharge status: Home / Self Care | Payer: Medicare Other | Source: Other Acute Inpatient Hospital | Attending: Internal Medicine | Admitting: Internal Medicine

## 2021-12-14 ENCOUNTER — Encounter (HOSPITAL_COMMUNITY)
Admission: EM | Disposition: A | Discharge status: Home / Self Care | Payer: Self-pay | Source: Other Acute Inpatient Hospital | Attending: Internal Medicine

## 2021-12-14 DIAGNOSIS — M1990 Unspecified osteoarthritis, unspecified site: Secondary | ICD-10-CM | POA: Diagnosis present

## 2021-12-14 DIAGNOSIS — E1122 Type 2 diabetes mellitus with diabetic chronic kidney disease: Secondary | ICD-10-CM | POA: Diagnosis present

## 2021-12-14 DIAGNOSIS — N289 Disorder of kidney and ureter, unspecified: Secondary | ICD-10-CM | POA: Diagnosis not present

## 2021-12-14 DIAGNOSIS — I214 Non-ST elevation (NSTEMI) myocardial infarction: Secondary | ICD-10-CM | POA: Diagnosis not present

## 2021-12-14 DIAGNOSIS — I5022 Chronic systolic (congestive) heart failure: Secondary | ICD-10-CM | POA: Diagnosis present

## 2021-12-14 DIAGNOSIS — E1169 Type 2 diabetes mellitus with other specified complication: Secondary | ICD-10-CM | POA: Diagnosis present

## 2021-12-14 DIAGNOSIS — I700 Atherosclerosis of aorta: Secondary | ICD-10-CM | POA: Diagnosis not present

## 2021-12-14 DIAGNOSIS — Z7982 Long term (current) use of aspirin: Secondary | ICD-10-CM

## 2021-12-14 DIAGNOSIS — I252 Old myocardial infarction: Secondary | ICD-10-CM | POA: Diagnosis not present

## 2021-12-14 DIAGNOSIS — F419 Anxiety disorder, unspecified: Secondary | ICD-10-CM | POA: Diagnosis not present

## 2021-12-14 DIAGNOSIS — E118 Type 2 diabetes mellitus with unspecified complications: Secondary | ICD-10-CM | POA: Diagnosis not present

## 2021-12-14 DIAGNOSIS — Z86711 Personal history of pulmonary embolism: Secondary | ICD-10-CM | POA: Diagnosis not present

## 2021-12-14 DIAGNOSIS — I5021 Acute systolic (congestive) heart failure: Secondary | ICD-10-CM | POA: Diagnosis not present

## 2021-12-14 DIAGNOSIS — I5189 Other ill-defined heart diseases: Secondary | ICD-10-CM | POA: Diagnosis not present

## 2021-12-14 DIAGNOSIS — I452 Bifascicular block: Secondary | ICD-10-CM | POA: Diagnosis present

## 2021-12-14 DIAGNOSIS — Z8249 Family history of ischemic heart disease and other diseases of the circulatory system: Secondary | ICD-10-CM

## 2021-12-14 DIAGNOSIS — Z20822 Contact with and (suspected) exposure to covid-19: Secondary | ICD-10-CM | POA: Diagnosis not present

## 2021-12-14 DIAGNOSIS — I110 Hypertensive heart disease with heart failure: Secondary | ICD-10-CM | POA: Diagnosis present

## 2021-12-14 DIAGNOSIS — R008 Other abnormalities of heart beat: Secondary | ICD-10-CM | POA: Diagnosis not present

## 2021-12-14 DIAGNOSIS — R079 Chest pain, unspecified: Secondary | ICD-10-CM | POA: Diagnosis not present

## 2021-12-14 DIAGNOSIS — Z955 Presence of coronary angioplasty implant and graft: Secondary | ICD-10-CM

## 2021-12-14 DIAGNOSIS — I440 Atrioventricular block, first degree: Secondary | ICD-10-CM | POA: Diagnosis not present

## 2021-12-14 DIAGNOSIS — I249 Acute ischemic heart disease, unspecified: Secondary | ICD-10-CM | POA: Diagnosis not present

## 2021-12-14 DIAGNOSIS — R609 Edema, unspecified: Secondary | ICD-10-CM

## 2021-12-14 DIAGNOSIS — IMO0001 Reserved for inherently not codable concepts without codable children: Secondary | ICD-10-CM | POA: Diagnosis present

## 2021-12-14 DIAGNOSIS — J449 Chronic obstructive pulmonary disease, unspecified: Secondary | ICD-10-CM | POA: Diagnosis present

## 2021-12-14 DIAGNOSIS — I129 Hypertensive chronic kidney disease with stage 1 through stage 4 chronic kidney disease, or unspecified chronic kidney disease: Secondary | ICD-10-CM | POA: Diagnosis not present

## 2021-12-14 DIAGNOSIS — N189 Chronic kidney disease, unspecified: Secondary | ICD-10-CM | POA: Diagnosis not present

## 2021-12-14 DIAGNOSIS — F17200 Nicotine dependence, unspecified, uncomplicated: Secondary | ICD-10-CM | POA: Diagnosis present

## 2021-12-14 DIAGNOSIS — Z79899 Other long term (current) drug therapy: Secondary | ICD-10-CM | POA: Diagnosis not present

## 2021-12-14 DIAGNOSIS — R600 Localized edema: Secondary | ICD-10-CM | POA: Diagnosis not present

## 2021-12-14 DIAGNOSIS — E785 Hyperlipidemia, unspecified: Secondary | ICD-10-CM | POA: Diagnosis not present

## 2021-12-14 DIAGNOSIS — F17210 Nicotine dependence, cigarettes, uncomplicated: Secondary | ICD-10-CM | POA: Diagnosis not present

## 2021-12-14 DIAGNOSIS — N390 Urinary tract infection, site not specified: Secondary | ICD-10-CM | POA: Diagnosis not present

## 2021-12-14 DIAGNOSIS — I130 Hypertensive heart and chronic kidney disease with heart failure and stage 1 through stage 4 chronic kidney disease, or unspecified chronic kidney disease: Secondary | ICD-10-CM | POA: Diagnosis not present

## 2021-12-14 DIAGNOSIS — E119 Type 2 diabetes mellitus without complications: Secondary | ICD-10-CM | POA: Diagnosis not present

## 2021-12-14 DIAGNOSIS — I2510 Atherosclerotic heart disease of native coronary artery without angina pectoris: Secondary | ICD-10-CM | POA: Diagnosis present

## 2021-12-14 DIAGNOSIS — I509 Heart failure, unspecified: Secondary | ICD-10-CM | POA: Diagnosis present

## 2021-12-14 DIAGNOSIS — Z7984 Long term (current) use of oral hypoglycemic drugs: Secondary | ICD-10-CM

## 2021-12-14 DIAGNOSIS — I517 Cardiomegaly: Secondary | ICD-10-CM | POA: Diagnosis not present

## 2021-12-14 HISTORY — DX: Non-ST elevation (NSTEMI) myocardial infarction: I21.4

## 2021-12-14 HISTORY — DX: Acute systolic (congestive) heart failure: I50.21

## 2021-12-14 HISTORY — PX: LEFT HEART CATH AND CORONARY ANGIOGRAPHY: CATH118249

## 2021-12-14 SURGERY — LEFT HEART CATH AND CORONARY ANGIOGRAPHY
Anesthesia: LOCAL

## 2021-12-14 MED ORDER — HEPARIN (PORCINE) IN NACL 1000-0.9 UT/500ML-% IV SOLN
INTRAVENOUS | Status: DC | PRN
  Administered 2021-12-14 (×2): 500 mL

## 2021-12-14 MED ORDER — SODIUM CHLORIDE 0.9% FLUSH
3.0000 mL | Freq: Two times a day (BID) | INTRAVENOUS | Status: DC
  Administered 2021-12-17 (×2): 3 mL via INTRAVENOUS

## 2021-12-14 MED ORDER — SODIUM CHLORIDE 0.9% FLUSH: 3.0000 mL | INTRAVENOUS | Status: DC | PRN

## 2021-12-14 MED ORDER — SODIUM CHLORIDE 0.9 % IV SOLN: 250.0000 mL | INTRAVENOUS | Status: DC | PRN

## 2021-12-14 MED ORDER — LIDOCAINE HCL (PF) 1 % IJ SOLN
INTRAMUSCULAR | Status: AC
  Filled 2021-12-14: qty 30

## 2021-12-14 MED ORDER — ATORVASTATIN CALCIUM 80 MG PO TABS
80.0000 mg | ORAL_TABLET | Freq: Every day | ORAL | Status: DC
  Administered 2021-12-15 – 2021-12-18 (×4): 80 mg via ORAL
  Filled 2021-12-14 (×4): qty 1

## 2021-12-14 MED ORDER — HEPARIN (PORCINE) 25000 UT/250ML-% IV SOLN
1600.0000 [IU]/h | INTRAVENOUS | Status: DC
  Administered 2021-12-14: 950 [IU]/h via INTRAVENOUS
  Administered 2021-12-15: 1300 [IU]/h via INTRAVENOUS
  Administered 2021-12-16: 1600 [IU]/h via INTRAVENOUS
  Filled 2021-12-14 (×2): qty 250

## 2021-12-14 MED ORDER — TRAMADOL HCL 50 MG PO TABS
50.0000 mg | ORAL_TABLET | Freq: Four times a day (QID) | ORAL | Status: DC | PRN
  Administered 2021-12-16 – 2021-12-18 (×5): 50 mg via ORAL
  Filled 2021-12-14 (×5): qty 1

## 2021-12-14 MED ORDER — FENTANYL CITRATE (PF) 100 MCG/2ML IJ SOLN
INTRAMUSCULAR | Status: AC
  Filled 2021-12-14: qty 2

## 2021-12-14 MED ORDER — ALBUTEROL SULFATE (2.5 MG/3ML) 0.083% IN NEBU: 3.0000 mL | INHALATION_SOLUTION | Freq: Four times a day (QID) | RESPIRATORY_TRACT | Status: DC | PRN

## 2021-12-14 MED ORDER — FENTANYL CITRATE (PF) 100 MCG/2ML IJ SOLN
INTRAMUSCULAR | Status: DC | PRN
  Administered 2021-12-14: 12.5 ug via INTRAVENOUS

## 2021-12-14 MED ORDER — HEPARIN SODIUM (PORCINE) 1000 UNIT/ML IJ SOLN
INTRAMUSCULAR | Status: AC
  Filled 2021-12-14: qty 10

## 2021-12-14 MED ORDER — IOHEXOL 350 MG/ML SOLN
INTRAVENOUS | Status: DC | PRN
  Administered 2021-12-14: 30 mL

## 2021-12-14 MED ORDER — VERAPAMIL HCL 2.5 MG/ML IV SOLN
INTRAVENOUS | Status: AC
  Filled 2021-12-14: qty 2

## 2021-12-14 MED ORDER — UMECLIDINIUM BROMIDE 62.5 MCG/ACT IN AEPB
1.0000 | INHALATION_SPRAY | Freq: Every day | RESPIRATORY_TRACT | Status: DC
  Administered 2021-12-17 – 2021-12-18 (×2): 1 via RESPIRATORY_TRACT
  Filled 2021-12-14: qty 7

## 2021-12-14 MED ORDER — HEPARIN (PORCINE) IN NACL 1000-0.9 UT/500ML-% IV SOLN
INTRAVENOUS | Status: AC
  Filled 2021-12-14: qty 500

## 2021-12-14 MED ORDER — METHOCARBAMOL 500 MG PO TABS
750.0000 mg | ORAL_TABLET | Freq: Three times a day (TID) | ORAL | Status: DC | PRN
  Filled 2021-12-14: qty 2

## 2021-12-14 MED ORDER — OMEGA-3-ACID ETHYL ESTERS 1 G PO CAPS
2.0000 | ORAL_CAPSULE | Freq: Two times a day (BID) | ORAL | Status: DC
  Administered 2021-12-15 – 2021-12-18 (×7): 2 g via ORAL
  Filled 2021-12-14 (×7): qty 2

## 2021-12-14 MED ORDER — GABAPENTIN 300 MG PO CAPS
300.0000 mg | ORAL_CAPSULE | Freq: Two times a day (BID) | ORAL | Status: DC
Start: 2021-12-14 — End: 2021-12-18
  Administered 2021-12-14 – 2021-12-18 (×8): 300 mg via ORAL
  Filled 2021-12-14 (×8): qty 1

## 2021-12-14 MED ORDER — SODIUM CHLORIDE 0.9% FLUSH
3.0000 mL | Freq: Two times a day (BID) | INTRAVENOUS | Status: DC
  Administered 2021-12-15 – 2021-12-17 (×5): 3 mL via INTRAVENOUS

## 2021-12-14 MED ORDER — LIDOCAINE HCL (PF) 1 % IJ SOLN
INTRAMUSCULAR | Status: DC | PRN
  Administered 2021-12-14: 5 mL

## 2021-12-14 MED ORDER — HYDRALAZINE HCL 20 MG/ML IJ SOLN: 10.0000 mg | INTRAMUSCULAR | Status: AC | PRN

## 2021-12-14 MED ORDER — MIDAZOLAM HCL 2 MG/2ML IJ SOLN
INTRAMUSCULAR | Status: DC | PRN
  Administered 2021-12-14: .5 mg via INTRAVENOUS

## 2021-12-14 MED ORDER — FUROSEMIDE 10 MG/ML IJ SOLN
40.0000 mg | Freq: Once | INTRAMUSCULAR | Status: AC
Start: 2021-12-14 — End: 2021-12-14
  Administered 2021-12-14: 40 mg via INTRAVENOUS
  Filled 2021-12-14: qty 4

## 2021-12-14 MED ORDER — ACETAMINOPHEN 325 MG PO TABS
650.0000 mg | ORAL_TABLET | ORAL | Status: DC | PRN
  Administered 2021-12-15 – 2021-12-17 (×6): 650 mg via ORAL
  Filled 2021-12-14 (×6): qty 2

## 2021-12-14 MED ORDER — HEPARIN SODIUM (PORCINE) 1000 UNIT/ML IJ SOLN
INTRAMUSCULAR | Status: DC | PRN
  Administered 2021-12-14: 4000 [IU] via INTRAVENOUS

## 2021-12-14 MED ORDER — FENOFIBRATE 160 MG PO TABS
160.0000 mg | ORAL_TABLET | Freq: Every day | ORAL | Status: DC
  Administered 2021-12-15 – 2021-12-18 (×4): 160 mg via ORAL
  Filled 2021-12-14 (×4): qty 1

## 2021-12-14 MED ORDER — SODIUM CHLORIDE 0.9 % IV SOLN: INTRAVENOUS | Status: DC

## 2021-12-14 MED ORDER — HEPARIN (PORCINE) IN NACL 2-0.9 UNITS/ML
INTRAMUSCULAR | Status: DC | PRN
  Administered 2021-12-14: 10 mL via INTRA_ARTERIAL

## 2021-12-14 MED ORDER — ONDANSETRON HCL 4 MG/2ML IJ SOLN: 4.0000 mg | Freq: Four times a day (QID) | INTRAMUSCULAR | Status: DC | PRN

## 2021-12-14 MED ORDER — MIDAZOLAM HCL 2 MG/2ML IJ SOLN
INTRAMUSCULAR | Status: AC
  Filled 2021-12-14: qty 2

## 2021-12-14 MED ORDER — ASPIRIN EC 81 MG PO TBEC
81.0000 mg | DELAYED_RELEASE_TABLET | Freq: Every day | ORAL | Status: DC
  Administered 2021-12-15 – 2021-12-18 (×4): 81 mg via ORAL
  Filled 2021-12-14 (×4): qty 1

## 2021-12-14 MED ORDER — EMPAGLIFLOZIN 25 MG PO TABS
25.0000 mg | ORAL_TABLET | Freq: Every day | ORAL | Status: DC
  Administered 2021-12-15 – 2021-12-18 (×4): 25 mg via ORAL
  Filled 2021-12-14 (×4): qty 1

## 2021-12-14 SURGICAL SUPPLY — 10 items
CATH 5FR JL3.5 JR4 ANG PIG MP (CATHETERS) ×1 IMPLANT
DEVICE RAD COMP TR BAND LRG (VASCULAR PRODUCTS) ×1 IMPLANT
GLIDESHEATH SLEND SS 6F .021 (SHEATH) ×1 IMPLANT
GUIDEWIRE INQWIRE 1.5J.035X260 (WIRE) IMPLANT
INQWIRE 1.5J .035X260CM (WIRE) ×2
KIT HEART LEFT (KITS) ×2 IMPLANT
PACK CARDIAC CATHETERIZATION (CUSTOM PROCEDURE TRAY) ×2 IMPLANT
SYR MEDRAD MARK 7 150ML (SYRINGE) ×2 IMPLANT
TRANSDUCER W/STOPCOCK (MISCELLANEOUS) ×2 IMPLANT
TUBING CIL FLEX 10 FLL-RA (TUBING) ×2 IMPLANT

## 2021-12-14 NOTE — Interval H&P Note (Signed)
History and Physical Interval Note: ? ?12/14/2021 ?2:51 PM ? ?Angela Lucero  has presented today for surgery, with the diagnosis of NSTEMI.  The various methods of treatment have been discussed with the patient and family. After consideration of risks, benefits and other options for treatment, the patient has consented to  Procedure(s): ?LEFT HEART CATH AND CORONARY ANGIOGRAPHY (N/A) as a surgical intervention.  The patient's history has been reviewed, patient examined, no change in status, stable for surgery.  I have reviewed the patient's chart and labs.  Questions were answered to the patient's satisfaction.   ? ?Cath Lab Visit (complete for each Cath Lab visit) ? ?Clinical Evaluation Leading to the Procedure:  ? ?ACS: Yes.   ? ?Non-ACS:  N/A ? ?[name] ? ? ?

## 2021-12-14 NOTE — H&P (Addendum)
Cardiology Admission History and Physical:   Patient ID: Angela Lucero MRN: [id number]; DOB: [birth year]   Admission date: 12/14/2021  PCP:  [name], MD   [facility] Providers Cardiologist:  Bing Matter     Chief Complaint:  Chest pain and leg swelling  Patient Profile:   Angela Lucero is a 81 y.o. female with history of coronary artery disease s/p PTCA in 1997 and PCI in 2005, hypertension, hyperlipidemia, and type 2 diabetes mellitus who is being seen 12/14/2021 for the evaluation of chest pain.  History of Present Illness:   Angela Lucero had sudden onset of chest pain at rest yesterday morning.  She describes it as a stabbing, burning, pressure-like sensation that was quite severe and lasted until this morning.  She has also been having progressive shortness of breath and leg swelling for the last 3 days.  [facility] to [facility].  She was noted to have mildly elevated and rising troponin and moderately reduced LVEF by echo (40-45%).  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  Currently, Angela Lucero is without chest pain or dyspnea, though she still feels like her legs are swollen.   Past Medical History:  Diagnosis Date   COPD (chronic obstructive pulmonary disease) (HCC)    Diabetes (HCC)    Heart attack (HCC)    [id number]   Hypertension     Past Surgical History:  Procedure Laterality Date   APPENDECTOMY     CHOLECYSTECTOMY     Navel hernia repair     PARTIAL HYSTERECTOMY     Stent Heart     Ulna nerve transpost L arm       Medications Prior to Admission: Prior to Admission medications   Medication Sig Start Date Sachi Boulay Date Taking? Authorizing Provider  acetaminophen (TYLENOL) 500 MG tablet Take 500 mg by mouth every 6 (six) hours as needed for mild pain.    [provider]  albuterol (VENTOLIN HFA) 108 (90 Base) MCG/ACT inhaler Inhale 2 puffs into the lungs every 6 (six) hours as needed for wheezing or shortness of  breath.    [provider]  aspirin EC 81 MG tablet Take 81 mg by mouth daily. Swallow whole.    [provider]  atorvastatin (LIPITOR) 80 MG tablet Take 80 mg by mouth daily.    [provider]  Cetirizine-Pseudoephedrine (ALLERGY RELIEF D PO) Take 1 tablet by mouth daily.    [provider]  Cholecalciferol (VITAMIN D3) 250 MCG (10000 UT) TABS Take 1 tablet by mouth in the morning and at bedtime.    [provider]  empagliflozin (JARDIANCE) 25 MG TABS tablet Take 25 mg by mouth daily.    [provider]  fenofibrate 160 MG tablet Take 160 mg by mouth daily.    [provider]  furosemide (LASIX) 40 MG tablet Take 40 mg by mouth daily.    [provider]  gabapentin (NEURONTIN) 300 MG capsule Take 300 mg by mouth 2 (two) times daily.    [provider]  glimepiride (AMARYL) 2 MG tablet Take 2 mg by mouth daily with breakfast.    [provider]  hydrochlorothiazide (HYDRODIURIL) 25 MG tablet Take 25 mg by mouth daily.    [provider]  levofloxacin (LEVAQUIN) 250 MG tablet Take 500 mg by mouth daily. 12/03/21   [provider]  meloxicam (MOBIC) 7.5 MG tablet Take 7.5 mg by mouth daily.    [provider]  methocarbamol (ROBAXIN) 750 MG tablet Take 750 mg by mouth 3 (three) times daily as needed for spasms. 11/30/21   [provider]  Multiple Vitamin (MULTIVITAMIN ADULT PO) Take 1 tablet by mouth daily.    [provider]  nebivolol (BYSTOLIC) 10 MG tablet Take 20 mg by mouth daily.    [provider]  Omega 3 1000 MG CAPS Take 2 capsules by mouth in the morning and at bedtime.    [provider]  tiotropium (SPIRIVA) 18 MCG inhalation capsule Place 18 mcg into inhaler and inhale daily.    [provider]  traMADol (ULTRAM) 50 MG tablet Take 50 mg by mouth every 6 (six) hours as needed for moderate pain.    [provider]      Allergies:   No Known Allergies  Social History:   Social History   Tobacco Use   Smoking status: Never   Smokeless tobacco: Never  Substance Use Topics   Alcohol use: Not Currently   Drug use: Never     Family History:   The patient's family history includes Heart disease in her father and mother.    ROS:  Please see the history of present illness.  All other ROS reviewed and negative.     Physical Exam/Data:  There were no vitals filed for this visit. No intake or output data in the 24 hours ending 12/14/21 1434    12/10/2021    1:38 PM  Last 3 Weights  Weight (lbs) 178 lb 9.6 oz  Weight (kg) 81.012 kg     There is no height or weight on file to calculate BMI.  General:  Well nourished, well developed, in no acute distress HEENT: normal Neck: no JVD Vascular: No carotid bruits; Distal pulses 2+ bilaterally   Cardiac:  normal S1, S2; RRR; 1/6 systolic murmur noted.  No rubs or gallops. Lungs: Coarse breath sounds bilaterally with scattered wheezes. Abd: soft, nontender, no hepatomegaly  Ext: 1+ nonpitting edema in both calves. Musculoskeletal:  No deformities, BUE and BLE strength normal and equal Skin: warm and dry  Neuro:  CNs 2-12 intact, no focal abnormalities noted Psych:  Normal affect    EKG:  The ECG that was done yesterday at [facility] reportedly showed normal sinus rhythm with first-degree AV block, left axis deviation, right bundle branch block, LVH, and anteroseptal infarct (tracing not available for personal review).  EKG performed with Dr. Bing Matter 4 days ago was personally reviewed and shows similar findings.  Relevant CV Studies: TTE (12/13/2021, [facility]): Technically difficult study.  LVEF 40-45% with mild dilation of the left ventricle.  Grade 1 diastolic dysfunction noted.  RV is normal in size and function.  There is mild left atrial enlargement.  Moderate mitral regurgitation noted.  1.6 x 1.7 cm echodensity noted in the right  atrium that appears to be attached to the lateral wall of the right atrium with a stalk-like structure.  Laboratory Data:  Outside labs: CBC (12/14/2021): WBC 12.1, Hgb 14.2, HCT 44.2, PLT 247.  D-dimer (12/13/2021): 710  PT/INR (12/13/2021): 10.3/1.0.  BMP (12/14/2021): Sodium 133, potassium 4.5, chloride 98, CO2 26, BUN 40, creatinine 1.4, glucose 162, calcium 9.5  Troponin I (12/13/2021): 0.02->0.17->0.97  Lipid panel (12/14/2021): Total cholesterol 134, LDL 37, HDL 31, triglycerides 330  Radiology/Studies:  No results found.   Assessment and Plan:   NSTEMI: Patient presents with acute onset of chest pain yesterday and was noted to have mild troponin elevation in the  setting of moderately reduced LVEF of uncertain chronicity.  She is currently chest pain-free but still appears to be somewhat volume overloaded.  Given her history of CAD, worsening ischemic heart disease is a concern.  However, in the setting of right atrial mass, progressive leg swelling, and elevated D-dimer, pulmonary embolism is also a consideration.  I have personally spoken with Dr. Tomie China, who reviewed the patient's echocardiogram in [place].  He reports that the morphology of the right atrial mass is not consistent with a thrombus in transit.  We have agreed to proceed with cardiac catheterization and possible PCI.  If an obvious cause for the patient's chest pain and elevated troponin is not found on catheterization, CTA chest versus VQ scan should be considered.  Continue aggressive secondary prevention of coronary disease.  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  Unfortunately, images are not available for review here.  I think it would be reasonable to repeat a limited echocardiogram to better characterize this here in [place].  Further evaluation with TEE and/or cardiac MRI may need to be considered.  Renal insufficiency: Chronicity of renal insufficiency uncertain, though creatinine at  [facility] is actually a little better compared with last week in Dr. Vanetta Shawl office.  We will try to minimize contrast exposure.  I do not think that hydration is prudent at this time, as the patient appears to be in acute heart failure.  Acute systolic heart failure: Echo yesterday at [facility] showed an EF of 40-45%.  Patient reports 2 to 3 days of progressive edema and shortness of breath.  We will assess her left ventricular filling pressure and likely continue with gentle diuresis as renal function allows.  Home medications include empagliflozin and nebivolol.  We will try to transition her to a more evidence-based heart failure regimen during this admission.  I would recommend avoidance of NSAIDs in the setting of heart failure and renal insufficiency.  Hyperlipidemia: Continue atorvastatin, fenofibrate, and omega-3.  Type 2 diabetes mellitus: Hold glimepiride this admission.  Continue empagliflozin.  Sliding scale insulin while here.  Shared Decision Making/Informed Consent The risks [stroke (1 in 1000), death (1 in 1000), kidney failure [usually temporary] (1 in 500), bleeding (1 in 200), allergic reaction [possibly serious] (1 in 200)], benefits (diagnostic support and management of coronary artery disease) and alternatives of a cardiac catheterization were discussed in detail with Ms. Zeitz and she is willing to proceed.  Severity of Illness: The appropriate patient status for this patient is INPATIENT. Inpatient status is judged to be reasonable and necessary in order to provide the required intensity of service to ensure the patient's safety. The patient's presenting symptoms, physical exam findings, and initial radiographic and laboratory data in the context of their chronic comorbidities is felt to place them at high risk for further clinical deterioration. Furthermore, it is not anticipated that the patient will be medically stable for discharge from the hospital  within 2 midnights of admission.   * I certify that at the point of admission it is my clinical judgment that the patient will require inpatient hospital care spanning beyond 2 midnights from the point of admission due to high intensity of service, high risk for further deterioration and high frequency of surveillance required.*   For questions or updates, please contact [facility] Please consult [email or web address] for contact info under     Signed, Yvonne Kendall, MD  12/14/2021 2:34 PM

## 2021-12-14 NOTE — Progress Notes (Signed)
Received patient from [facility] via [facility] alert and oriented X4 skin warm and dry, resp even and unlabored.  Pt denies any CP at this time. Dr End in to talk to patient about procedure, consent signed by patient..  Pt connected to bedside monitor and call bell in reach.  IVF of NS started in the left forearm with Heparin piggyback. No problem at site.  ?

## 2021-12-14 NOTE — Progress Notes (Signed)
ANTICOAGULATION CONSULT NOTE - Follow Up Consult ? ?Pharmacy Consult for IV heparin ?Indication: chest pain/ACS ? ?No Known Allergies ? ?Patient Measurements: ?Height: 5' 5.98" (167.6 cm) ?Weight: 81 kg (178 lb 9.2 oz) ?IBW/kg (Calculated) : 59.26 ?Heparin Dosing Weight: 76.2 kg ? ?Vital Signs: ?BP: 152/67 (04/17 1655) ?Pulse Rate: 73 (04/17 1655) ? ?Labs: ?No results for input(s): HGB, HCT, PLT, APTT, LABPROT, INR, HEPARINUNFRC, HEPRLOWMOCWT, CREATININE, CKTOTAL, CKMB, TROPONINIHS in the last 72 hours. ? ?Estimated Creatinine Clearance: 29.9 mL/min (A) (by C-G formula based on SCr of 1.64 mg/dL (H)). ? ? ?Medications:  ?Infusions:  ? ?Assessment: ?81 yo F presenting with chest pain and NSTEMI s/p LHC. Also of note has an incidental finding of a right atrial mass. Pharmacy consulted for heparin drip.  ? ?Radial band removed at 1730 ? ?Goal of Therapy:  ?Heparin level 0.3-0.7 units/ml ?Monitor platelets by anticoagulation protocol: Yes ?  ?Plan:  ?Heparin drip at 950 units/hr ?Heparin level with am labs. ?Daily heparin level and CBC ordered ?Monitor for signs/symptoms of bleed ? ?Thank you for allowing pharmacy to be a part of this patient?s care. ? ?Tomie China, PharmD ?Clinical Pharmacist ? ?Please check AMION for all [facility] Pharm[facility] numbers ?After 10:00 PM, call Main Pharmacy [phone] ? ? ? ?

## 2021-12-14 NOTE — Progress Notes (Signed)
TR BAND REMOVAL ? ?LOCATION:    Right radial ? ?DEFLATED PER PROTOCOL:    Yes.   ? ?TIME BAND OFF / DRESSING APPLIED:    1745pm A clean dry dressing applied  ? ?SITE UPON ARRIVAL:    Level 0 ? ?SITE AFTER BAND REMOVAL:    Level 0 ? ?CIRCULATION SENSATION AND MOVEMENT:    Within Normal Limits   Yes.   ? ?COMMENTS:   Care instruction given to patient. ?

## 2021-12-15 ENCOUNTER — Encounter (HOSPITAL_COMMUNITY): Payer: Self-pay | Admitting: Internal Medicine

## 2021-12-15 ENCOUNTER — Inpatient Hospital Stay (HOSPITAL_COMMUNITY): Payer: Medicare Other

## 2021-12-15 DIAGNOSIS — I5189 Other ill-defined heart diseases: Secondary | ICD-10-CM

## 2021-12-15 DIAGNOSIS — R600 Localized edema: Secondary | ICD-10-CM | POA: Diagnosis not present

## 2021-12-15 DIAGNOSIS — R008 Other abnormalities of heart beat: Secondary | ICD-10-CM | POA: Diagnosis not present

## 2021-12-15 DIAGNOSIS — I214 Non-ST elevation (NSTEMI) myocardial infarction: Secondary | ICD-10-CM | POA: Diagnosis not present

## 2021-12-15 DIAGNOSIS — I5021 Acute systolic (congestive) heart failure: Secondary | ICD-10-CM

## 2021-12-15 LAB — ECHOCARDIOGRAM LIMITED
Height: 65.984 in
S' Lateral: 4.8 cm
Weight: 2857.16 oz

## 2021-12-15 LAB — CBC
HCT: 40 % (ref 36.0–46.0)
Hemoglobin: 13.1 g/dL (ref 12.0–15.0)
MCH: 31 pg (ref 26.0–34.0)
MCHC: 32.8 g/dL (ref 30.0–36.0)
MCV: 94.6 fL (ref 80.0–100.0)
Platelets: 213 10*3/uL (ref 150–400)
RBC: 4.23 MIL/uL (ref 3.87–5.11)
RDW: 14.5 % (ref 11.5–15.5)
WBC: 9.9 10*3/uL (ref 4.0–10.5)
nRBC: 0 % (ref 0.0–0.2)

## 2021-12-15 LAB — BASIC METABOLIC PANEL
Anion gap: 11 (ref 5–15)
BUN: 31 mg/dL — ABNORMAL HIGH (ref 8–23)
CO2: 26 mmol/L (ref 22–32)
Calcium: 8.9 mg/dL (ref 8.9–10.3)
Chloride: 97 mmol/L — ABNORMAL LOW (ref 98–111)
Creatinine, Ser: 1.57 mg/dL — ABNORMAL HIGH (ref 0.44–1.00)
GFR, Estimated: 33 mL/min — ABNORMAL LOW (ref >60)
Glucose, Bld: 139 mg/dL — ABNORMAL HIGH (ref 70–99)
Potassium: 3.9 mmol/L (ref 3.5–5.1)
Sodium: 134 mmol/L — ABNORMAL LOW (ref 135–145)

## 2021-12-15 LAB — HEPARIN LEVEL (UNFRACTIONATED)
Heparin Unfractionated: <0.1 IU/mL — ABNORMAL LOW (ref 0.30–0.70)
Heparin Unfractionated: <0.1 IU/mL — ABNORMAL LOW (ref 0.30–0.70)

## 2021-12-15 LAB — GLUCOSE, CAPILLARY
Glucose-Capillary: 208 mg/dL — ABNORMAL HIGH (ref 70–99)
Glucose-Capillary: 109 mg/dL — ABNORMAL HIGH (ref 70–99)
Glucose-Capillary: 131 mg/dL — ABNORMAL HIGH (ref 70–99)

## 2021-12-15 MED ORDER — POTASSIUM CHLORIDE CRYS ER 20 MEQ PO TBCR
40.0000 meq | EXTENDED_RELEASE_TABLET | Freq: Once | ORAL | Status: AC
  Administered 2021-12-15: 40 meq via ORAL
  Filled 2021-12-15: qty 2

## 2021-12-15 MED ORDER — PERFLUTREN LIPID MICROSPHERE
1.0000 mL | INTRAVENOUS | Status: AC | PRN
  Administered 2021-12-15: 2 mL via INTRAVENOUS
  Filled 2021-12-15: qty 10

## 2021-12-15 MED ORDER — FUROSEMIDE 10 MG/ML IJ SOLN
40.0000 mg | Freq: Once | INTRAMUSCULAR | Status: AC
  Administered 2021-12-15: 40 mg via INTRAVENOUS
  Filled 2021-12-15: qty 4

## 2021-12-15 NOTE — Progress Notes (Signed)
? ?Progress Note ? ?Patient Name: Jeaneen Cala ?Date of Encounter: 12/15/2021 ? ?[facility] Cardiologist: None Dr. Bing Matter ? ?Subjective  ? ?Feels much better.  Diuresed well.  LE swelling decreased.  ? ?Inpatient Medications  ?  ?Scheduled Meds: ? aspirin EC  81 mg Oral Daily  ? atorvastatin  80 mg Oral Daily  ? empagliflozin  25 mg Oral Daily  ? fenofibrate  160 mg Oral Daily  ? gabapentin  300 mg Oral BID  ? omega-3 acid ethyl esters  2 capsule Oral BID WC  ? sodium chloride flush  3 mL Intravenous Q12H  ? sodium chloride flush  3 mL Intravenous Q12H  ? umeclidinium bromide  1 puff Inhalation Daily  ? ?Continuous Infusions: ? sodium chloride    ? sodium chloride 50 mL/hr at 12/14/21 2008  ? sodium chloride    ? heparin 1,300 Units/hr (12/15/21 0500)  ? ?PRN Meds: ?sodium chloride, sodium chloride, acetaminophen, albuterol, methocarbamol, ondansetron (ZOFRAN) IV, perflutren lipid microspheres (DEFINITY) IV suspension, sodium chloride flush, sodium chloride flush, traMADol  ? ?Vital Signs  ?  ?Vitals:  ? 12/15/21 0018 12/15/21 0349 12/15/21 0831 12/15/21 1052  ?BP: (!) 143/72 (!) 141/68 136/60 132/62  ?Pulse: 70 70 66 73  ?Resp: 18 20 15  (!) 21  ?Temp: 98 ?F (36.7 ?C) 97.9 ?F (36.6 ?C) 97.6 ?F (36.4 ?C) 98.3 ?F (36.8 ?C)  ?TempSrc: Oral Oral Oral Oral  ?SpO2: 98% 95%    ?Weight:      ?Height:      ? ? ?Intake/Output Summary (Last 24 hours) at 12/15/2021 1120 ?Last data filed at 12/15/2021 1027 ?Gross per 24 hour  ?Intake --  ?Output 1100 ml  ?Net -1100 ml  ? ? ?  12/14/2021  ?  4:55 PM 12/10/2021  ?  1:38 PM  ?Last 3 Weights  ?Weight (lbs) 178 lb 9.2 oz 178 lb 9.6 oz  ?Weight (kg) 81 kg 81.012 kg  ?   ? ?Telemetry  ?  ?nsr - Personally Reviewed ? ?ECG  ?  ? ? ?Physical Exam  ? ?GEN: No acute distress.   ?Neck: No JVD ?Cardiac: RRR, no murmurs, rubs, or gallops.  ?Respiratory: Clear to auscultation bilaterally. ?GI: Soft, nontender, non-distended  ?MS: Bilateral leg edema, 2+; No deformity. ?Neuro:  Nonfocal  ?Psych:  Normal affect  ? ?Labs  ?  ?High Sensitivity Troponin:  No results for input(s): TROPONINIHS in the last 720 hours.   ?Chemistry ?Recent Labs  ?Lab 12/10/21 ?1421 12/15/21 ?12/17/21  ?NA 137 134*  ?K 4.7 3.9  ?CL 98 97*  ?CO2 22 26  ?GLUCOSE 157* 139*  ?BUN 31* 31*  ?CREATININE 1.64* 1.57*  ?CALCIUM 9.6 8.9  ?GFRNONAA  --  10*  ?ANIONGAP  --  11  ?  ?Lipids No results for input(s): CHOL, TRIG, HDL, LABVLDL, LDLCALC, CHOLHDL in the last 168 hours.  ?Hematology ?Recent Labs  ?Lab 12/15/21 ?12/17/21  ?WBC 9.9  ?RBC 4.23  ?HGB 13.1  ?HCT 40.0  ?MCV 94.6  ?MCH 31.0  ?MCHC 32.8  ?RDW 14.5  ?PLT 213  ? ?Thyroid No results for input(s): TSH, FREET4 in the last 168 hours.  ?BNP ?Recent Labs  ?Lab 12/10/21 ?1421  ?PROBNP 5,218*  ?  ?DDimer No results for input(s): DDIMER in the last 168 hours.  ? ?Radiology  ?  ?CARDIAC CATHETERIZATION ? ?Result Date: 12/14/2021 ?Conclusions: Severe single-vessel coronary artery disease with chronic total occlusion of mid LAD with right-to-left and left-to-left collaterals.  There is  moderate LCx and RCA disease. Patent distal RCA stent with mild in-stent restenosis (~10%). Mildly elevated left ventricular filling pressure (LVEDP ~20-25 mmHg). Recommendations: Medical therapy of CTO of mid LAD.  I do not believe this alone explains the patient's severe chest pain and mild troponin elevation over the last 1-2 days.  Consider CTA chest to exclude PE tomorrow (if renal function allows) versus V/Q scan in the setting of recent leg edema, elevated D-dimer, and right atrial mass. Restart heparin 2 hours after TR band removal pending exclusion of PE. Repeat echocardiogram to reassess reported right atrial mass. Gentle diuresis. Optimize goal-directed medical therapy for acute systolic heart failure, as tolerated. Yvonne Kendall, MD [facility]  ? ?Cardiac Studies  ? ?Decreased LVEF by echo today ? ?Patient Profile  ?   ?81 y.o. female with CAD, volume overload, question of right atrial mass; low  EF ? ?Assessment & Plan  ?  ?Chronic systolic heart failure: Continue diuresis. Medical therapy for LV dysfunction. On Jardiance.  Will try to start Entresto if renal function allows.  Breathing has improved significantly with diuresis.  Leg edema also much better.  Continue diuresis as her creatinine allows. ? ?CAD: Continue medical therapy.  Occluded LAD stent.  Medical therapy for now.  No plans for intervention until potential atrial mass is sorted out. ? ?Right atrial mass: Will see if MRI would be helpful.  Echo images reviewed personally.  I do not think this looks like mobile thrombus.  We will have one of the noninvasive physicians review as well to see if potentially cardiac MRI would be helpful. ? ? ?For questions or updates, please contact [facility] ?Please consult [email or web address] for contact info under  ? ?  ?   ?Signed, ?Lance Muss, MD  ?12/15/2021, 11:20 AM    ?

## 2021-12-15 NOTE — Progress Notes (Incomplete)
?  Echocardiogram ?2D Echocardiogram has been performed. ? ?Roseanna Rainbow R ?12/15/2021, 10:08 AM ?

## 2021-12-15 NOTE — Progress Notes (Signed)
ANTICOAGULATION CONSULT NOTE - Follow Up Consult ? ?Pharmacy Consult for heparin ?Indication:  medical management of CTO of mid LAD and r/o VTE ? ?Labs: ?Recent Labs  ?  12/15/21 ?3235  ?HGB 13.1  ?HCT 40.0  ?PLT 213  ?HEPARINUNFRC <0.10*  ?CREATININE 1.57*  ? ? ?Assessment: ?81yo female subtherapeutic on heparin with initial dosing post-cath; no infusion issues or signs of bleeding per RN. ? ?Goal of Therapy:  ?Heparin level 0.3-0.7 units/ml ?  ?Plan:  ?Will increase heparin infusion by 4 units/kg/hr to 1300 units/hr and check level in 8 hours.   ? ?Vernard Gambles, PharmD, BCPS  ?12/15/2021,4:50 AM ? ? ?

## 2021-12-15 NOTE — Progress Notes (Signed)
ANTICOAGULATION CONSULT NOTE - Follow Up Consult ? ?Pharmacy Consult for IV heparin ?Indication: chest pain/ACS ? ?No Known Allergies ? ?Patient Measurements: ?Height: 5' 5.98" (167.6 cm) ?Weight: 81 kg (178 lb 9.2 oz) ?IBW/kg (Calculated) : 59.26 ?Heparin Dosing Weight: 76.2 kg ? ?Vital Signs: ?Temp: 98.3 ?F (36.8 ?C) (04/18 1052) ?Temp Source: Oral (04/18 1052) ?BP: 132/62 (04/18 1052) ?Pulse Rate: 73 (04/18 1052) ? ?Labs: ?Recent Labs  ?  12/15/21 ?[id number] 12/15/21 ?1403  ?HGB 13.1  --   ?HCT 40.0  --   ?PLT 213  --   ?HEPARINUNFRC <0.10* <0.10*  ?CREATININE 1.57*  --   ? ? ?Estimated Creatinine Clearance: 31.2 mL/min (A) (by C-G formula based on SCr of 1.57 mg/dL (H)). ? ? ?Medications:  ?Infusions:  ? sodium chloride    ? sodium chloride 50 mL/hr at 12/15/21 1443  ? sodium chloride    ? heparin 1,300 Units/hr (12/15/21 1437)  ? ? ?Assessment: ?81 yo F presenting with chest pain and NSTEMI s/p LHC. Also of note has an incidental finding of a right atrial mass. Pharmacy consulted for heparin drip while ruling out thrombus. ? ?Heparin level remains undetectable, no infusion issues per RN. ? ?Goal of Therapy:  ?Heparin level 0.3-0.7 units/ml ?Monitor platelets by anticoagulation protocol: Yes ?  ?Plan:  ?Increase heparin to 1500 units/h ?Recheck heparin level in 8h ? ?Arrie Senate, PharmD, BCPS, BCCP ?Clinical Pharmacist ?[phone] ?Please check AMION for all [place] numbers ?12/15/2021 ? ? ? ?

## 2021-12-15 NOTE — Plan of Care (Signed)

## 2021-12-16 ENCOUNTER — Other Ambulatory Visit (HOSPITAL_COMMUNITY): Payer: Self-pay

## 2021-12-16 ENCOUNTER — Inpatient Hospital Stay (HOSPITAL_COMMUNITY): Payer: Medicare Other

## 2021-12-16 DIAGNOSIS — I214 Non-ST elevation (NSTEMI) myocardial infarction: Secondary | ICD-10-CM | POA: Diagnosis not present

## 2021-12-16 DIAGNOSIS — I5021 Acute systolic (congestive) heart failure: Secondary | ICD-10-CM

## 2021-12-16 DIAGNOSIS — R600 Localized edema: Secondary | ICD-10-CM | POA: Diagnosis not present

## 2021-12-16 DIAGNOSIS — I5189 Other ill-defined heart diseases: Secondary | ICD-10-CM | POA: Diagnosis not present

## 2021-12-16 LAB — GLUCOSE, CAPILLARY: Glucose-Capillary: 184 mg/dL — ABNORMAL HIGH (ref 70–99)

## 2021-12-16 LAB — CBC
HCT: 36.9 % (ref 36.0–46.0)
Hemoglobin: 12.2 g/dL (ref 12.0–15.0)
MCH: 31.4 pg (ref 26.0–34.0)
MCHC: 33.1 g/dL (ref 30.0–36.0)
MCV: 94.9 fL (ref 80.0–100.0)
Platelets: 181 10*3/uL (ref 150–400)
RBC: 3.89 MIL/uL (ref 3.87–5.11)
RDW: 14.7 % (ref 11.5–15.5)
WBC: 7.8 10*3/uL (ref 4.0–10.5)
nRBC: 0 % (ref 0.0–0.2)

## 2021-12-16 LAB — BASIC METABOLIC PANEL
Anion gap: 6 (ref 5–15)
BUN: 36 mg/dL — ABNORMAL HIGH (ref 8–23)
CO2: 24 mmol/L (ref 22–32)
Calcium: 8.5 mg/dL — ABNORMAL LOW (ref 8.9–10.3)
Chloride: 105 mmol/L (ref 98–111)
Creatinine, Ser: 1.33 mg/dL — ABNORMAL HIGH (ref 0.44–1.00)
GFR, Estimated: 41 mL/min — ABNORMAL LOW (ref >60)
Glucose, Bld: 125 mg/dL — ABNORMAL HIGH (ref 70–99)
Potassium: 4.1 mmol/L (ref 3.5–5.1)
Sodium: 135 mmol/L (ref 135–145)

## 2021-12-16 LAB — HEPARIN LEVEL (UNFRACTIONATED)
Heparin Unfractionated: 0.27 IU/mL — ABNORMAL LOW (ref 0.30–0.70)
Heparin Unfractionated: 0.36 IU/mL (ref 0.30–0.70)

## 2021-12-16 MED ORDER — FUROSEMIDE 10 MG/ML IJ SOLN
40.0000 mg | Freq: Once | INTRAMUSCULAR | Status: DC
Start: 2021-12-16 — End: 2021-12-17
  Filled 2021-12-16: qty 4

## 2021-12-16 MED ORDER — GADOBUTROL 1 MMOL/ML IV SOLN
10.0000 mL | Freq: Once | INTRAVENOUS | Status: AC | PRN
  Administered 2021-12-16: 10 mL via INTRAVENOUS

## 2021-12-16 MED ORDER — SACUBITRIL-VALSARTAN 24-26 MG PO TABS
1.0000 | ORAL_TABLET | Freq: Two times a day (BID) | ORAL | Status: DC
  Administered 2021-12-16 – 2021-12-18 (×4): 1 via ORAL
  Filled 2021-12-16 (×4): qty 1

## 2021-12-16 MED ORDER — ISOSORBIDE MONONITRATE ER 30 MG PO TB24
30.0000 mg | ORAL_TABLET | Freq: Every day | ORAL | Status: DC
  Administered 2021-12-16 – 2021-12-18 (×3): 30 mg via ORAL
  Filled 2021-12-16 (×3): qty 1

## 2021-12-16 NOTE — TOC Benefit Eligibility Note (Signed)
Patient Advocate Encounter ? ?Insurance verification completed.   ? ?The patient is currently admitted and upon discharge could be taking [place] or Entresto. ? ?The current 30 day co-pay for [place] is: $150.13.  Sherryll Burger is $84.70. ? ?[place] Pink, CPhT ?Pharmacy Patient Advocate Specialist ?[facility] Patient Advocate Team ?Direct Number: [phone]  Fax: [phone]  ?

## 2021-12-16 NOTE — Progress Notes (Signed)
~  0715 patient called out for sudden onset of SOB and chest pain rating 6/10 described a "crushing" pressure to middle of chest, O2 sats noted to be 88-89 on room air, placed on [place] and improvement to low 90s. Chest pain episode lasting approximately 10 minutes before residing. EKG obtained during episode and placed on paper chart. MD paged-awaiting return phone call.  ? ?~0735 patient is chest pain free, lying in bed states breathing feels better BP 128/65, HR 81 O2 93% on #3 [place].  ? ?Will attempt to page MD again.  ?

## 2021-12-16 NOTE — Progress Notes (Signed)
ANTICOAGULATION CONSULT NOTE - Follow Up Consult ? ?Pharmacy Consult for heparin ?Indication:  medical management of CTO of mid LAD ? ?Labs: ?Recent Labs  ?  12/15/21 ?2979 12/15/21 ?1403 12/16/21 ?8921  ?HGB 13.1  --  12.2  ?HCT 40.0  --  36.9  ?PLT 213  --  181  ?HEPARINUNFRC <0.10* <0.10* 0.27*  ?CREATININE 1.57*  --  1.33*  ? ? ? ?Assessment: ?81yo female remains slightly subtherapeutic on heparin after rate changes; no infusion issues or signs of bleeding per RN. ? ?Goal of Therapy:  ?Heparin level 0.3-0.7 units/ml ?  ?Plan:  ?Will increase heparin infusion by 1-2 units/kg/hr to 1600 units/hr and check level in 8 hours.   ? ?Vernard Gambles, PharmD, BCPS  ?12/16/2021,2:25 AM ? ? ?

## 2021-12-16 NOTE — Progress Notes (Signed)
? ?Progress Note ? ?Patient Name: Angela Lucero ?Date of Encounter: 12/16/2021 ? ?[facility] Cardiologist: Dr. Agustin Cree ? ?Subjective  ? ?Had some chest discomfort this morning.  It resolved spontaneously.  Still feels that she is diuresing well.  She did report that she has chronic ankle swelling.  She feels that her ankles currently look to be at baseline.  The swelling above her ankles is new. ? ?Inpatient Medications  ?  ?Scheduled Meds: ? aspirin EC  81 mg Oral Daily  ? atorvastatin  80 mg Oral Daily  ? empagliflozin  25 mg Oral Daily  ? fenofibrate  160 mg Oral Daily  ? gabapentin  300 mg Oral BID  ? isosorbide mononitrate  30 mg Oral Daily  ? omega-3 acid ethyl esters  2 capsule Oral BID WC  ? sodium chloride flush  3 mL Intravenous Q12H  ? sodium chloride flush  3 mL Intravenous Q12H  ? umeclidinium bromide  1 puff Inhalation Daily  ? ?Continuous Infusions: ? sodium chloride    ? sodium chloride 50 mL/hr at 12/15/21 1443  ? sodium chloride    ? heparin 1,600 Units/hr (12/16/21 [id number])  ? ?PRN Meds: ?sodium chloride, sodium chloride, acetaminophen, albuterol, methocarbamol, ondansetron (ZOFRAN) IV, sodium chloride flush, sodium chloride flush, traMADol  ? ?Vital Signs  ?  ?Vitals:  ? 12/16/21 0300 12/16/21 0416 12/16/21 0600 12/16/21 0737  ?BP:  134/69  128/65  ?Pulse: 61 68 71   ?Resp:  20 18   ?Temp:  97.7 ?F (36.5 ?C)  (!) 97.3 ?F (36.3 ?C)  ?TempSrc:  Oral  Axillary  ?SpO2: 97% 93% 92%   ?Weight:      ?Height:      ? ? ?Intake/Output Summary (Last 24 hours) at 12/16/2021 0911 ?Last data filed at 12/16/2021 0418 ?Gross per 24 hour  ?Intake 2386.18 ml  ?Output 500 ml  ?Net 1886.18 ml  ? ? ?  12/14/2021  ?  4:55 PM 12/10/2021  ?  1:38 PM  ?Last 3 Weights  ?Weight (lbs) 178 lb 9.2 oz 178 lb 9.6 oz  ?Weight (kg) 81 kg 81.012 kg  ?   ? ?Telemetry  ?  ?Normal sinus rhythm- Personally Reviewed ? ?ECG  ?  ? ? ?Physical Exam  ? ?GEN: No acute distress.   ?Neck: No JVD ?Cardiac: RRR, no murmurs, rubs, or gallops.   ?Respiratory: Clear to auscultation bilaterally. ?GI: Soft, nontender, non-distended  ?MS: Bilateral ankle and lower extremity edema; No deformity. ?Neuro:  Nonfocal  ?Psych: Normal affect  ? ?Labs  ?  ?High Sensitivity Troponin:  No results for input(s): TROPONINIHS in the last 720 hours.   ?Chemistry ?Recent Labs  ?Lab 12/10/21 ?1421 12/15/21 ?[id number] 12/16/21 ?[id number]  ?NA 137 134* 135  ?K 4.7 3.9 4.1  ?CL 98 97* 105  ?CO2 22 26 24   ?GLUCOSE 157* 139* 125*  ?BUN 31* 31* 36*  ?CREATININE 1.64* 1.57* 1.33*  ?CALCIUM 9.6 8.9 8.5*  ?GFRNONAA  --  33* 41*  ?ANIONGAP  --  11 6  ?  ?Lipids No results for input(s): CHOL, TRIG, HDL, LABVLDL, LDLCALC, CHOLHDL in the last 168 hours.  ?Hematology ?Recent Labs  ?Lab 12/15/21 ?[id number] 12/16/21 ?[id number]  ?WBC 9.9 7.8  ?RBC 4.23 3.89  ?HGB 13.1 12.2  ?HCT 40.0 36.9  ?MCV 94.6 94.9  ?MCH 31.0 31.4  ?MCHC 32.8 33.1  ?RDW 14.5 14.7  ?PLT 213 181  ? ?Thyroid No results for input(s): TSH, FREET4 in the last 168 hours.  ?  BNP ?Recent Labs  ?Lab 12/10/21 ?1421  ?PROBNP 5,218*  ?  ?DDimer No results for input(s): DDIMER in the last 168 hours.  ? ?Radiology  ?  ?CARDIAC CATHETERIZATION ? ?Result Date: 12/14/2021 ?Conclusions: Severe single-vessel coronary artery disease with chronic total occlusion of mid LAD with right-to-left and left-to-left collaterals.  There is moderate LCx and RCA disease. Patent distal RCA stent with mild in-stent restenosis (~10%). Mildly elevated left ventricular filling pressure (LVEDP ~20-25 mmHg). Recommendations: Medical therapy of CTO of mid LAD.  I do not believe this alone explains the patient's severe chest pain and mild troponin elevation over the last 1-2 days.  Consider CTA chest to exclude PE tomorrow (if renal function allows) versus V/Q scan in the setting of recent leg edema, elevated D-dimer, and right atrial mass. Restart heparin 2 hours after TR band removal pending exclusion of PE. Repeat echocardiogram to reassess reported right atrial mass. Gentle  diuresis. Optimize goal-directed medical therapy for acute systolic heart failure, as tolerated. Nelva Bush, MD [facility] ? ?ECHOCARDIOGRAM LIMITED ? ?Result Date: 12/15/2021 ?   ECHOCARDIOGRAM LIMITED REPORT   Patient Name:   Angela Lucero  Date of Exam: 12/15/2021 Medical Rec #:  [id number]  Height:       66.0 in Accession #:    [id number] Weight:       178.6 lb Date of Birth:  Sep 16, 1940  BSA:          1.906 m? Patient Age:    81 years   BP:           136/66 mmHg Patient Gender: F          HR:           79 bpm. Exam Location:  Inpatient Procedure: Limited Echo, Cardiac Doppler, Color Doppler and Intracardiac            Opacification Agent Indications:    Right atrial mass. ; R00.8 Other abnormalities of heart  History:        Patient has no prior history of Echocardiogram examinations.                 CHF, Previous Myocardial Infarction; Risk Factors:Diabetes,                 Current Smoker and Dyslipidemia.  Sonographer:    Roseanna Rainbow RDCS Referring Phys: [place]  Sonographer Comments: Technically difficult study due to poor echo windows. IMPRESSIONS  1. There is a fibrous structure that moves with tricuspid valve annulus. This could represent a mass just the fibrous connection between the RA/RV. Consider TEE for better characterization. Does not appear to be thrombus. Cannot review images from yesterday.  2. Left ventricular ejection fraction, by estimation, is 30 to 35%. The left ventricle has moderately decreased function. The left ventricle demonstrates regional wall motion abnormalities (see scoring diagram/findings for description). The left ventricular internal cavity size was mildly dilated.  3. Right ventricular systolic function is normal. The right ventricular size is normal.  4. The mitral valve is grossly normal. Mild to moderate mitral valve regurgitation. No evidence of mitral stenosis.  5. The inferior vena cava is normal in size with greater than 50% respiratory variability,  suggesting right atrial pressure of 3 mmHg. Conclusion(s)/Recommendation(s): Findings consistent with ischemic cardiomyopathy. No left ventricular mural or apical thrombus/thrombi. FINDINGS  Left Ventricle: Left ventricular ejection fraction, by estimation, is 30 to 35%. The left ventricle has moderately decreased function. The left ventricle demonstrates regional  wall motion abnormalities. Definity contrast agent was given IV to delineate the left ventricular endocardial borders. The left ventricular internal cavity size was mildly dilated. There is no left ventricular hypertrophy.  LV Wall Scoring: The mid and distal anterior septum, inferior septum, entire inferior wall, apical anterior segment, and apex are hypokinetic. Right Ventricle: The right ventricular size is normal. No increase in right ventricular wall thickness. Right ventricular systolic function is normal. Left Atrium: Left atrial size was normal in size. Right Atrium: There is a fibrous structure that moves with tricuspid valve annulus. This could represent a mass just the fibrous connection between the RA/RV. Consider TEE for better characterization. Does not appear to be thrombus. Cannot review images from yesterday. Right atrial size was normal in size. Mitral Valve: The mitral valve is grossly normal. Mild to moderate mitral valve regurgitation. No evidence of mitral valve stenosis. Tricuspid Valve: The tricuspid valve is grossly normal. Tricuspid valve regurgitation is trivial. No evidence of tricuspid stenosis. Aorta: The aortic root and ascending aorta are structurally normal, with no evidence of dilitation. Venous: The inferior vena cava is normal in size with greater than 50% respiratory variability, suggesting right atrial pressure of 3 mmHg. IAS/Shunts: The atrial septum is grossly normal. LEFT VENTRICLE PLAX 2D LVIDd:         5.70 cm LVIDs:         4.80 cm LV PW:         0.90 cm LV IVS:        0.90 cm   AORTA Ao Root diam: 3.40 cm Ao Asc  diam:  3.30 cm Eleonore Chiquito MD Electronically signed by Eleonore Chiquito MD Signature Date/Time: 12/15/2021/12:55:18 PM    Final    ? ?Cardiac Studies  ? ?Echo reviewed showing right atrial mass.  ? ?ECG this morning

## 2021-12-16 NOTE — Progress Notes (Signed)
? ? ?  Cardiac MRI: 12/16/21 ? ?IMPRESSION: ?1.  No right atrial mass seen ?  ?2. Subendocardial late gadolinium enhancement consistent with prior ?infarct in LAD territory (basal anteroseptum, mid ?anterior/anteroseptal, apical anterior/septal, and apex). LGE is ?greater than 50% transmural suggesting nonviability in this ?territory ?  ?3.  Severe LV dilatation with severe systolic dysfunction (EF 24%) ?  ?4.  Normal RV size and systolic function (EF 52%) ?  ?5.  Moderate mitral regurgitation (regurgitant fraction 26%) ?  ?6.  Opacity at left lung base, recommend dedicated lung imaging ?  ?  ?Electronically Signed ?  By: Epifanio Lesches M.D. ?  On: 12/16/2021 15:53 ?  ? ?-stop IV heparin ?-start Entresto 24-26 BID ? ?Signed, ?Laverda Page, NP-C ?12/16/2021, 4:05 PM ?Pager: [phone] ? ?

## 2021-12-16 NOTE — Progress Notes (Signed)
ANTICOAGULATION CONSULT NOTE - Follow Up Consult ? ?Pharmacy Consult for IV heparin ?Indication: chest pain/ACS ? ?No Known Allergies ? ?Patient Measurements: ?Height: 5' 5.98" (167.6 cm) ?Weight: 81 kg (178 lb 9.2 oz) ?IBW/kg (Calculated) : 59.26 ?Heparin Dosing Weight: 76.2 kg ? ?Vital Signs: ?Temp: 97.3 ?F (36.3 ?C) (04/19 0177) ?Temp Source: Axillary (04/19 0737) ?BP: 128/65 (04/19 0737) ?Pulse Rate: 71 (04/19 0600) ? ?Labs: ?Recent Labs  ?  12/15/21 ?9390 12/15/21 ?1403 12/16/21 ?3009 12/16/21 ?0505  ?HGB 13.1  --  12.2  --   ?HCT 40.0  --  36.9  --   ?PLT 213  --  181  --   ?HEPARINUNFRC <0.10* <0.10* 0.27* 0.36  ?CREATININE 1.57*  --  1.33*  --   ? ? ? ?Estimated Creatinine Clearance: 36.8 mL/min (A) (by C-G formula based on SCr of 1.33 mg/dL (H)). ? ? ?Medications:  ?Infusions:  ? sodium chloride    ? sodium chloride 50 mL/hr at 12/15/21 1443  ? sodium chloride    ? heparin 1,600 Units/hr (12/16/21 2330)  ? ? ?Assessment: ?81 yo F presenting with chest pain and NSTEMI s/p LHC. Also of note has an incidental finding of a right atrial mass. Pharmacy consulted for heparin drip while ruling out thrombus. ? ?Heparin level therapeutic at 0.36 after rate adjustment. cMRI ordered to assess RA mass and r/o thrombus. ? ?Goal of Therapy:  ?Heparin level 0.3-0.7 units/ml ?Monitor platelets by anticoagulation protocol: Yes ?  ?Plan:  ?Continue heparin 1600 units/h ?Daily heparin level, CBC ? ? ?[place] High[place], PharmD, BCPS, BCCP ?Clinical Pharmacist ?[phone] ?Please check AMION for all [facility] numbers ?12/16/2021 ? ? ? ?

## 2021-12-17 ENCOUNTER — Other Ambulatory Visit: Payer: Medicare Other

## 2021-12-17 LAB — BASIC METABOLIC PANEL
Anion gap: 8 (ref 5–15)
BUN: 31 mg/dL — ABNORMAL HIGH (ref 8–23)
CO2: 25 mmol/L (ref 22–32)
Calcium: 9.3 mg/dL (ref 8.9–10.3)
Chloride: 104 mmol/L (ref 98–111)
Creatinine, Ser: 1.32 mg/dL — ABNORMAL HIGH (ref 0.44–1.00)
GFR, Estimated: 41 mL/min — ABNORMAL LOW (ref >60)
Glucose, Bld: 119 mg/dL — ABNORMAL HIGH (ref 70–99)
Potassium: 4 mmol/L (ref 3.5–5.1)
Sodium: 137 mmol/L (ref 135–145)

## 2021-12-17 LAB — HEMOGLOBIN A1C
Hgb A1c MFr Bld: 6.8 % — ABNORMAL HIGH (ref 4.8–5.6)
Mean Plasma Glucose: 148.46 mg/dL

## 2021-12-17 LAB — GLUCOSE, CAPILLARY
Glucose-Capillary: 204 mg/dL — ABNORMAL HIGH (ref 70–99)
Glucose-Capillary: 109 mg/dL — ABNORMAL HIGH (ref 70–99)
Glucose-Capillary: 186 mg/dL — ABNORMAL HIGH (ref 70–99)

## 2021-12-17 LAB — CBC
HCT: 41.3 % (ref 36.0–46.0)
Hemoglobin: 13.1 g/dL (ref 12.0–15.0)
MCH: 30.5 pg (ref 26.0–34.0)
MCHC: 31.7 g/dL (ref 30.0–36.0)
MCV: 96 fL (ref 80.0–100.0)
Platelets: 237 10*3/uL (ref 150–400)
RBC: 4.3 MIL/uL (ref 3.87–5.11)
RDW: 14.6 % (ref 11.5–15.5)
WBC: 8.1 10*3/uL (ref 4.0–10.5)
nRBC: 0 % (ref 0.0–0.2)

## 2021-12-17 MED ORDER — FUROSEMIDE 10 MG/ML IJ SOLN
40.0000 mg | Freq: Once | INTRAMUSCULAR | Status: AC
  Administered 2021-12-17: 40 mg via INTRAVENOUS
  Filled 2021-12-17: qty 4

## 2021-12-17 MED ORDER — ENOXAPARIN SODIUM 40 MG/0.4ML IJ SOSY
40.0000 mg | PREFILLED_SYRINGE | INTRAMUSCULAR | Status: DC
  Administered 2021-12-17: 40 mg via SUBCUTANEOUS
  Filled 2021-12-17 (×2): qty 0.4

## 2021-12-17 MED ORDER — INSULIN ASPART 100 UNIT/ML IJ SOLN
0.0000 [IU] | Freq: Three times a day (TID) | INTRAMUSCULAR | Status: DC
  Administered 2021-12-17: 5 [IU] via SUBCUTANEOUS
  Administered 2021-12-18: 3 [IU] via SUBCUTANEOUS

## 2021-12-17 MED ORDER — METOPROLOL TARTRATE 12.5 MG HALF TABLET
12.5000 mg | ORAL_TABLET | Freq: Two times a day (BID) | ORAL | Status: DC
  Administered 2021-12-17 (×2): 12.5 mg via ORAL
  Filled 2021-12-17 (×2): qty 1

## 2021-12-17 NOTE — TOC Progression Note (Signed)
Transition of Care (TOC) - Progression Note  ? ? ?Patient Details  ?Name: Angela Lucero ?MRN: [id number] ?Date of Birth: November 11, 1940 ? ?Transition of Care (TOC) CM/SW Contact  ?Beckie Busing, RN ?Phone Number:[phone] ? ?12/17/2021, 3:15 PM ? ?Clinical Narrative:    ? ?Transition of Care (TOC) Screening Note ? ? ?Patient Details  ?Name: Angela Lucero ?Date of Birth: 1941-03-04 ? ? ?Transition of Care (TOC) CM/SW Contact:    ?Beckie Busing, RN ?Phone Number: ?12/17/2021, 3:16 PM ? ? ? ?Transition of Care Department [facility]) has reviewed patient and no TOC needs have been identified at this time. We will continue to monitor patient advancement through interdisciplinary progression rounds. If new patient transition needs arise, please place a TOC consult. ? ? ? ? ?  ?  ? ?Expected Discharge Plan and Services ?  ?  ?  ?  ?  ?                ?  ?  ?  ?  ?  ?  ?  ?  ?  ?  ? ? ?Social Determinants of Health (SDOH) Interventions ?  ? ?Readmission Risk Interventions ?   ? View : No data to display.  ?  ?  ?  ? ? ?

## 2021-12-17 NOTE — Progress Notes (Signed)
Mobility Specialist Progress Note  ? ? 12/17/21 1359  ?Mobility  ?Activity Ambulated independently in hallway  ?Level of Assistance Modified independent, requires aide device or extra time  ?Assistive Device Front wheel walker  ?Distance Ambulated (ft) 370 ft  ?Activity Response Tolerated well  ?$Mobility charge 1 Mobility  ? ?Pre-Mobility: 74 HR, 133/62 BP ? ?Pt received sitting EOB and agreeable.C/o back and knee pain. Returned to sitting EOB with call bell in reach.   ? ?[place] ?Mobility Specialist  ?Primary: 5N M.S. Phone: [phone] ?Secondary: 6N M.S. Phone: [phone] ?  ?

## 2021-12-17 NOTE — Care Management Important Message (Signed)
Important Message ? ?Patient Details  ?Name: Angela Lucero ?MRN: [id number] ?Date of Birth: 09-18-1940 ? ? ?Medicare Important Message Given:  Yes ? ? ? ? ?[name] ?12/17/2021, 1:52 PM ?

## 2021-12-17 NOTE — Plan of Care (Signed)
?  Problem: Activity: ?Goal: Ability to return to baseline activity level will improve ?Outcome: Progressing ?  ?Problem: Education: ?Goal: Knowledge of General Education information will improve ?Description: Including pain rating scale, medication(s)/side effects and non-pharmacologic comfort measures ?Outcome: Progressing ?  ?Problem: Health Behavior/Discharge Planning: ?Goal: Ability to manage health-related needs will improve ?Outcome: Progressing ?  ?Problem: Clinical Measurements: ?Goal: Ability to maintain clinical measurements within normal limits will improve ?Outcome: Progressing ?Goal: Will remain free from infection ?Outcome: Progressing ?Goal: Diagnostic test results will improve ?Outcome: Progressing ?Goal: Respiratory complications will improve ?Outcome: Progressing ?Goal: Cardiovascular complication will be avoided ?Outcome: Progressing ?  ?Problem: Activity: ?Goal: Risk for activity intolerance will decrease ?Outcome: Progressing ?  ?Problem: Nutrition: ?Goal: Adequate nutrition will be maintained ?Outcome: Progressing ?  ?Problem: Coping: ?Goal: Level of anxiety will decrease ?Outcome: Progressing ?  ?Problem: Elimination: ?Goal: Will not experience complications related to bowel motility ?Outcome: Progressing ?Goal: Will not experience complications related to urinary retention ?Outcome: Progressing ?  ?Problem: Pain Managment: ?Goal: General experience of comfort will improve ?Outcome: Progressing ?  ?Problem: Safety: ?Goal: Ability to remain free from injury will improve ?Outcome: Progressing ?  ?Problem: Skin Integrity: ?Goal: Risk for impaired skin integrity will decrease ?Outcome: Progressing ?  ?

## 2021-12-17 NOTE — Progress Notes (Addendum)
? ?Progress Note ? ?Patient Name: Angela Lucero ?Date of Encounter: 12/17/2021 ? ?[facility] Cardiologist: Dr. Bing Matter ? ?Subjective  ? ?Patient is feeling much better this AM. Denies any chest pain, palpitations, SOB. Is on room air. Has not walked in the halls, but has walked to the bathroom herself and did not have any trouble breathing. Continues to have swelling in her ankles that is worse than her baseline.  ? ?Inpatient Medications  ?  ?Scheduled Meds: ? aspirin EC  81 mg Oral Daily  ? atorvastatin  80 mg Oral Daily  ? empagliflozin  25 mg Oral Daily  ? enoxaparin (LOVENOX) injection  40 mg Subcutaneous Q24H  ? fenofibrate  160 mg Oral Daily  ? gabapentin  300 mg Oral BID  ? isosorbide mononitrate  30 mg Oral Daily  ? omega-3 acid ethyl esters  2 capsule Oral BID WC  ? sacubitril-valsartan  1 tablet Oral BID  ? sodium chloride flush  3 mL Intravenous Q12H  ? sodium chloride flush  3 mL Intravenous Q12H  ? umeclidinium bromide  1 puff Inhalation Daily  ? ?Continuous Infusions: ? sodium chloride    ? sodium chloride 50 mL/hr at 12/15/21 1443  ? sodium chloride    ? ?PRN Meds: ?sodium chloride, sodium chloride, acetaminophen, albuterol, methocarbamol, ondansetron (ZOFRAN) IV, sodium chloride flush, sodium chloride flush, traMADol  ? ?Vital Signs  ?  ?Vitals:  ? 12/16/21 2300 12/16/21 2321 12/17/21 0300 12/17/21 0509  ?BP:  (!) 127/58  118/77  ?Pulse:  66  64  ?Resp: 18 19 18 17   ?Temp:  97.6 ?F (36.4 ?C)  97.6 ?F (36.4 ?C)  ?TempSrc:  Oral  Oral  ?SpO2:  93%  95%  ?Weight:      ?Height:      ? ? ?Intake/Output Summary (Last 24 hours) at 12/17/2021 0938 ?Last data filed at 12/17/2021 0607 ?Gross per 24 hour  ?Intake 480 ml  ?Output 800 ml  ?Net -320 ml  ? ? ?  12/14/2021  ?  4:55 PM 12/10/2021  ?  1:38 PM  ?Last 3 Weights  ?Weight (lbs) 178 lb 9.2 oz 178 lb 9.6 oz  ?Weight (kg) 81 kg 81.012 kg  ?   ? ?Telemetry  ?  ?Sinus rhythm - Personally Reviewed ? ?ECG  ?  ?Sinus rhythm, 1st degree AV block, left axis  deviation, RBBB, LAFB - Personally Reviewed ? ?Physical Exam  ? ?GEN: No acute distress. Sitting comfortably on the side of the bed eating breakfast ?Neck: No JVD ?Cardiac: RRR, no murmurs, rubs, or gallops.  ?Respiratory: Faint expiratory wheezes, coarse breath sounds that cleared with cough   ?GI: Soft, nontender, non-distended  ?MS: 2+ bilateral lower extremity edema to mid shins ?Neuro:  Nonfocal  ?Psych: Normal affect  ? ?Labs  ?  ?High Sensitivity Troponin:  No results for input(s): TROPONINIHS in the last 720 hours.   ?Chemistry ?Recent Labs  ?Lab 12/15/21 ?12/17/21 12/16/21 ?12/18/21 12/17/21 ?0140  ?NA 134* 135 137  ?K 3.9 4.1 4.0  ?CL 97* 105 104  ?CO2 26 24 25   ?GLUCOSE 139* 125* 119*  ?BUN 31* 36* 31*  ?CREATININE 1.57* 1.33* 1.32*  ?CALCIUM 8.9 8.5* 9.3  ?GFRNONAA 33* 41* 41*  ?ANIONGAP 11 6 8   ?  ?Lipids No results for input(s): CHOL, TRIG, HDL, LABVLDL, LDLCALC, CHOLHDL in the last 168 hours.  ?Hematology ?Recent Labs  ?Lab 12/15/21 ? 12/16/21 ?12/17/21 12/17/21 ?0140  ?WBC 9.9 7.8 8.1  ?RBC 4.23 3.89  4.30  ?HGB 13.1 12.2 13.1  ?HCT 40.0 36.9 41.3  ?MCV 94.6 94.9 96.0  ?MCH 31.0 31.4 30.5  ?MCHC 32.8 33.1 31.7  ?RDW 14.5 14.7 14.6  ?PLT 213 181 237  ? ?Thyroid No results for input(s): TSH, FREET4 in the last 168 hours.  ?BNP ?Recent Labs  ?Lab 12/10/21 ?1421  ?PROBNP 5,218*  ?  ?DDimer No results for input(s): DDIMER in the last 168 hours.  ? ?Radiology  ?  ?MR CARDIAC MORPHOLOGY W WO CONTRAST ? ?Result Date: 12/16/2021 ?CLINICAL DATA:  48F with CAD, T2DM p/w chest pain. Echo showed EF 30-35%,?RA mass. Cath showed CTO mid LAD, patent RCA stent. EXAM: CARDIAC MRI TECHNIQUE: The patient was scanned on a 1.5 Tesla Siemens magnet. A dedicated cardiac coil was used. Functional imaging was done using Fiesta sequences. 2,3, and 4 chamber views were done to assess for RWMA's. Modified Simpson's rule using a short axis stack was used to calculate an ejection fraction on a dedicated work Research officer, trade unionstation using Circle software.  The patient received 10 cc of Gadavist. After 10 minutes inversion recovery sequences were used to assess for infiltration and scar tissue. CONTRAST:  10 cc  of Gadavist FINDINGS: Left ventricle: -Severe dilatation -Severe systolic dysfunction -Subendocardial LGE consistent with prior infarct in basal anteroseptum, mid anterior/anteroseptum, apical anterior/septum, and apex. LGE >50% transmural suggesting nonviability in this territory LV EF:  24% (Normal 56-78%) Absolute volumes: LV EDV: 335mL (Normal 52-141 mL) LV ESV: 254mL (Normal 13-51 mL) LV SV: 80mL (Normal 33-97 mL) CO: 6.3L/min (Normal 2.7-6.0 L/min) Indexed volumes: LV EDV: 14272mL/sq-m (Normal 41-81 mL/sq-m) LV ESV: 12131mL/sq-m (Normal 12-21 mL/sq-m) LV SV: 5141mL/sq-m (Normal 26-56 mL/sq-m) CI: 3.3L/min/sq-m (Normal 1.8-3.8 L/min/sq-m) Right ventricle: Normal size and systolic function RV EF: 52% (Normal 47-80%) Absolute volumes: RV EDV: 153mL (Normal 58-154 mL) RV ESV: 74mL (Normal 12-68 mL) RV SV: 79mL (Normal 35-98 mL) CO: 6.2L/min (Normal 2.7-6 L/min) Indexed volumes: RV EDV: 4979mL/sq-m (Normal 48-87 mL/sq-m) RV ESV: 3538mL/sq-m (Normal 11-28 mL/sq-m) RV SV: 5441mL/sq-m (Normal 27-57 mL/sq-m) CI: 3.2L/min/sq-m (Normal 1.8-3.8 L/min/sq-m) Left atrium: Moderate enlargement Right atrium: Mild enlargement.  No mass seen Mitral valve: Moderate regurgitation (regurgitant fraction 26%) Aortic valve: Tricuspid.  No regurgitation Tricuspid valve: Mild regurgitation Pulmonic valve: No regurgitation Aorta: Normal proximal ascending aorta Coronary arteries: Normal origins Pericardium: Normal Extracardiac structures: Opacity left base, recommend dedicated lung imaging IMPRESSION: 1.  No right atrial mass seen 2. Subendocardial late gadolinium enhancement consistent with prior infarct in LAD territory (basal anteroseptum, mid anterior/anteroseptal, apical anterior/septal, and apex). LGE is greater than 50% transmural suggesting nonviability in this territory 3.  Severe LV  dilatation with severe systolic dysfunction (EF 24%) 4.  Normal RV size and systolic function (EF 52%) 5.  Moderate mitral regurgitation (regurgitant fraction 26%) 6.  Opacity at left lung base, recommend dedicated lung imaging Electronically Signed   By: Epifanio Lescheshristopher  Schumann M.D.   On: 12/16/2021 15:53  ? ?ECHOCARDIOGRAM LIMITED ? ?Result Date: 12/15/2021 ?   ECHOCARDIOGRAM LIMITED REPORT   Patient Name:   Angela Lucero  Date of Exam: 12/15/2021 Medical Rec #:  [id number]  Height:       66.0 in Accession #:    [id number] Weight:       178.6 lb Date of Birth:  11/11/1940  BSA:          1.906 m? Patient Age:    80 years   BP:           136/66 mmHg Patient Gender:  F          HR:           79 bpm. Exam Location:  Inpatient Procedure: Limited Echo, Cardiac Doppler, Color Doppler and Intracardiac            Opacification Agent Indications:    Right atrial mass. ; R00.8 Other abnormalities of heart  History:        Patient has no prior history of Echocardiogram examinations.                 CHF, Previous Myocardial Infarction; Risk Factors:Diabetes,                 Current Smoker and Dyslipidemia.  Sonographer:    Sheralyn Boatman RDCS Referring Phys: [id number] Corky Crafts  Sonographer Comments: Technically difficult study due to poor echo windows. IMPRESSIONS  1. There is a fibrous structure that moves with tricuspid valve annulus. This could represent a mass just the fibrous connection between the RA/RV. Consider TEE for better characterization. Does not appear to be thrombus. Cannot review images from yesterday.  2. Left ventricular ejection fraction, by estimation, is 30 to 35%. The left ventricle has moderately decreased function. The left ventricle demonstrates regional wall motion abnormalities (see scoring diagram/findings for description). The left ventricular internal cavity size was mildly dilated.  3. Right ventricular systolic function is normal. The right ventricular size is normal.  4. The mitral valve is grossly  normal. Mild to moderate mitral valve regurgitation. No evidence of mitral stenosis.  5. The inferior vena cava is normal in size with greater than 50% respiratory variability, suggesting right atrial pressure of 3

## 2021-12-18 ENCOUNTER — Other Ambulatory Visit (HOSPITAL_COMMUNITY): Payer: Self-pay

## 2021-12-18 ENCOUNTER — Telehealth: Payer: Self-pay

## 2021-12-18 ENCOUNTER — Other Ambulatory Visit: Payer: Self-pay | Admitting: Cardiology

## 2021-12-18 DIAGNOSIS — F17200 Nicotine dependence, unspecified, uncomplicated: Secondary | ICD-10-CM

## 2021-12-18 DIAGNOSIS — I509 Heart failure, unspecified: Secondary | ICD-10-CM

## 2021-12-18 DIAGNOSIS — R609 Edema, unspecified: Secondary | ICD-10-CM

## 2021-12-18 DIAGNOSIS — E118 Type 2 diabetes mellitus with unspecified complications: Secondary | ICD-10-CM

## 2021-12-18 DIAGNOSIS — E785 Hyperlipidemia, unspecified: Secondary | ICD-10-CM

## 2021-12-18 DIAGNOSIS — I5022 Chronic systolic (congestive) heart failure: Secondary | ICD-10-CM

## 2021-12-18 HISTORY — DX: Edema, unspecified: R60.9

## 2021-12-18 LAB — CBC
HCT: 37.8 % (ref 36.0–46.0)
Hemoglobin: 12.3 g/dL (ref 12.0–15.0)
MCH: 31 pg (ref 26.0–34.0)
MCHC: 32.5 g/dL (ref 30.0–36.0)
MCV: 95.2 fL (ref 80.0–100.0)
Platelets: 188 10*3/uL (ref 150–400)
RBC: 3.97 MIL/uL (ref 3.87–5.11)
RDW: 14.4 % (ref 11.5–15.5)
WBC: 7.1 10*3/uL (ref 4.0–10.5)
nRBC: 0 % (ref 0.0–0.2)

## 2021-12-18 LAB — BASIC METABOLIC PANEL
Anion gap: 7 (ref 5–15)
BUN: 30 mg/dL — ABNORMAL HIGH (ref 8–23)
CO2: 23 mmol/L (ref 22–32)
Calcium: 8.6 mg/dL — ABNORMAL LOW (ref 8.9–10.3)
Chloride: 104 mmol/L (ref 98–111)
Creatinine, Ser: 1.32 mg/dL — ABNORMAL HIGH (ref 0.44–1.00)
GFR, Estimated: 41 mL/min — ABNORMAL LOW (ref >60)
Glucose, Bld: 153 mg/dL — ABNORMAL HIGH (ref 70–99)
Potassium: 4 mmol/L (ref 3.5–5.1)
Sodium: 134 mmol/L — ABNORMAL LOW (ref 135–145)

## 2021-12-18 LAB — MAGNESIUM: Magnesium: 2 mg/dL (ref 1.7–2.4)

## 2021-12-18 LAB — GLUCOSE, CAPILLARY: Glucose-Capillary: 162 mg/dL — ABNORMAL HIGH (ref 70–99)

## 2021-12-18 MED ORDER — SACUBITRIL-VALSARTAN 24-26 MG PO TABS
1.0000 | ORAL_TABLET | Freq: Two times a day (BID) | ORAL | 6 refills | Status: DC
Start: 2021-12-18 — End: 2022-01-05
  Filled 2021-12-18: qty 60, 30d supply, fill #0

## 2021-12-18 MED ORDER — METOPROLOL SUCCINATE ER 25 MG PO TB24
25.0000 mg | ORAL_TABLET | Freq: Every day | ORAL | 3 refills | Status: DC
  Filled 2021-12-18: qty 90, 90d supply, fill #0

## 2021-12-18 MED ORDER — FUROSEMIDE 10 MG/ML IJ SOLN
40.0000 mg | Freq: Once | INTRAMUSCULAR | Status: AC
  Administered 2021-12-18: 40 mg via INTRAVENOUS
  Filled 2021-12-18: qty 4

## 2021-12-18 MED ORDER — ISOSORBIDE MONONITRATE ER 30 MG PO TB24
30.0000 mg | ORAL_TABLET | Freq: Every day | ORAL | 3 refills | Status: DC
  Filled 2021-12-18: qty 90, 90d supply, fill #0

## 2021-12-18 MED ORDER — METOPROLOL SUCCINATE ER 25 MG PO TB24
25.0000 mg | ORAL_TABLET | Freq: Every day | ORAL | Status: DC
  Administered 2021-12-18: 25 mg via ORAL
  Filled 2021-12-18: qty 1

## 2021-12-18 NOTE — Telephone Encounter (Signed)
-----   Message from Margie Billet, PA-C sent at 12/18/2021  7:10 AM EDT ----- ?Regarding: lab work ?I ordered a BMP on this patient to be drawn next week. Can you please contact her to explain the process and provide the [facility] address? ? ?Thanks,  ?KJ ? ?

## 2021-12-18 NOTE — Telephone Encounter (Signed)
Called pt to come in for BMP as per Jonita Albee, PA-C note. Lab req sent to lab ?

## 2021-12-18 NOTE — Progress Notes (Signed)
Patient discharge instructions reviewed and written copy given to patient. Son Angela Lucero to get car, discharge medications received from [facility] pharmacy. Patient via wheelchair to son's waiting car in stable condition ?

## 2021-12-18 NOTE — Plan of Care (Signed)
?  Problem: Education: Goal: Understanding of CV disease, CV risk reduction, and recovery process will improve Outcome: Completed/Met Goal: Individualized Educational Video(s) Outcome: Completed/Met   Problem: Activity: Goal: Ability to return to baseline activity level will improve Outcome: Completed/Met   Problem: Cardiovascular: Goal: Ability to achieve and maintain adequate cardiovascular perfusion will improve Outcome: Completed/Met Goal: Vascular access site(s) Level 0-1 will be maintained Outcome: Completed/Met   Problem: Health Behavior/Discharge Planning: Goal: Ability to safely manage health-related needs after discharge will improve Outcome: Completed/Met   Problem: Education: Goal: Knowledge of General Education information will improve Description: Including pain rating scale, medication(s)/side effects and non-pharmacologic comfort measures Outcome: Completed/Met   Problem: Health Behavior/Discharge Planning: Goal: Ability to manage health-related needs will improve Outcome: Completed/Met   Problem: Clinical Measurements: Goal: Ability to maintain clinical measurements within normal limits will improve Outcome: Completed/Met Goal: Will remain free from infection Outcome: Completed/Met Goal: Diagnostic test results will improve Outcome: Completed/Met Goal: Respiratory complications will improve Outcome: Completed/Met Goal: Cardiovascular complication will be avoided Outcome: Completed/Met   Problem: Activity: Goal: Risk for activity intolerance will decrease Outcome: Completed/Met   Problem: Nutrition: Goal: Adequate nutrition will be maintained Outcome: Completed/Met   Problem: Coping: Goal: Level of anxiety will decrease Outcome: Completed/Met   Problem: Elimination: Goal: Will not experience complications related to bowel motility Outcome: Completed/Met Goal: Will not experience complications related to urinary retention Outcome: Completed/Met   Problem: Pain Managment: Goal: General experience of comfort will improve Outcome: Completed/Met   Problem: Safety: Goal: Ability to remain free from injury will improve Outcome: Completed/Met   Problem: Skin Integrity: Goal: Risk for impaired skin integrity will decrease Outcome: Completed/Met   

## 2021-12-18 NOTE — TOC Transition Note (Signed)
Transition of Care (TOC) - CM/SW Discharge Note ? ? ?Patient Details  ?Name: Angela Lucero ?MRN: [id number] ?Date of Birth: September 07, 1940 ? ?Transition of Care (TOC) CM/SW Contact:  ?Beckie Busing, RN ?Phone [phone] ? ?12/18/2021, 10:17 AM ? ? ?Clinical Narrative:    ?Patient with discharge orders. No TOC needs noted ? ? ?Final next level of care: Home/Self Care ?Barriers to Discharge: No Barriers Identified ? ? ?Patient Goals and CMS Choice ?  ?  ?Choice offered to / list presented to : NA ? ?Discharge Placement ?  ?           ?  ?  ?  ?  ? ?Discharge Plan and Services ?  ?  ?           ?DME Arranged: N/A ?DME Agency: NA ?  ?  ?  ?HH Arranged: NA ?HH Agency: NA ?  ?  ?  ? ?Social Determinants of Health (SDOH) Interventions ?  ? ? ?Readmission Risk Interventions ?   ? View : No data to display.  ?  ?  ?  ? ? ? ? ? ?

## 2021-12-18 NOTE — Discharge Summary (Addendum)
?Discharge Summary  ?  ?Patient ID: Angela Lucero ?MRN: [id number]; DOB: [birth year] ? ?Admit date: 12/14/2021 ?Discharge date: 12/18/2021 ? ?PCP:  Georgeanna Lea, MD ?  ?[facility] Providers ?Cardiologist:  Gypsy Balsam, MD   ? ? ?Discharge Diagnoses  ?  ?Principal Problem: ?  Non-ST elevation (NSTEMI) myocardial infarction [facility]) ?Active Problems: ?  Type 2 diabetes mellitus with complication, without long-term current use of insulin (HCC) ?  Smoking ?  Dyslipidemia ?  Acute systolic heart failure (HCC) ?  Chronic edema ? ? ? ?Diagnostic Studies/Procedures  ?  ?Left Heart Cath 12/14/21 ?Conclusions: ?Severe single-vessel coronary artery disease with chronic total occlusion of mid LAD with right-to-left and left-to-left collaterals.  There is moderate LCx and RCA disease. ?Patent distal RCA stent with mild in-stent restenosis (~10%). ?Mildly elevated left ventricular filling pressure (LVEDP ~20-25 mmHg). ?  ?Recommendations: ?Medical therapy of CTO of mid LAD.  I do not believe this alone explains the patient's severe chest pain and mild troponin elevation over the last 1-2 days.  Consider CTA chest to exclude PE tomorrow (if renal function allows) versus V/Q scan in the setting of recent leg edema, elevated D-dimer, and right atrial mass. ?Restart heparin 2 hours after TR band removal pending exclusion of PE. ?Repeat echocardiogram to reassess reported right atrial mass. ?Gentle diuresis. ?Optimize goal-directed medical therapy for acute systolic heart failure, as tolerated. ?Diagnostic ?Dominance: Right ? ?  ?Echocardiogram 12/15/21 ? 1. There is a fibrous structure that moves with tricuspid valve annulus.  ?This could represent a mass just the fibrous connection between the RA/RV.  ?Consider TEE for better characterization. Does not appear to be thrombus.  ?Cannot review images from  ?yesterday.  ? 2. Left ventricular ejection fraction, by estimation, is 30 to 35%. The  ?left ventricle has moderately  decreased function. The left ventricle  ?demonstrates regional wall motion abnormalities (see scoring  ?diagram/findings for description). The left  ?ventricular internal cavity size was mildly dilated.  ? 3. Right ventricular systolic function is normal. The right ventricular  ?size is normal.  ? 4. The mitral valve is grossly normal. Mild to moderate mitral valve  ?regurgitation. No evidence of mitral stenosis.  ? 5. The inferior vena cava is normal in size with greater than 50%  ?respiratory variability, suggesting right atrial pressure of 3 mmHg.  ? 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 No left ventricular mural or apical thrombus/thrombi.  ?  ?Cardiac MRI 12/16/21  ?IMPRESSION: ?1.  No right atrial mass seen ?  ?2. Subendocardial late gadolinium enhancement consistent with prior ?infarct in LAD territory (basal anteroseptum, mid ?anterior/anteroseptal, apical anterior/septal, and apex). LGE is ?greater than 50% transmural suggesting nonviability in this ?territory ?  ?3.  Severe LV dilatation with severe systolic dysfunction (EF 24%) ?  ?4.  Normal RV size and systolic function (EF 52%) ?  ?5.  Moderate mitral regurgitation (regurgitant fraction 26%) ?  ?6.  Opacity at left lung base, recommend dedicated lung imaging ?_____________ ?  ?History of Present Illness   ?  ?Angela Lucero is a 81 y.o. female with history of coronary artery disease s/p PTCA in 1997 and PCI in 2005, hypertension, hyperlipidemia, and type 2 diabetes mellitus who was seen on 12/14/2021 for the evaluation of chest pain.  ? ?Angela Lucero presented to the ED on 4/16 complaining of an episode of sudden onset of chest pain at rest the day prior. She described the pain as a stabbing, burning, pressure-like sensation that was  quite severe and lasted until the following morning.  She had also been having progressive shortness of breath and leg swelling for the last 3 days.  [facility].  She was noted to have mildly elevated and rising troponin and moderately reduced LVEF by echo (40-45%).  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 ? ?At the time of initial cardiac evaluation, Ms. Fosdick is without chest pain or dyspnea, though she still feels like her legs are swollen. Patient was transported to [facility] and underwent LHC on 12/14/21 ? ?Hospital Course  ?   ?Consultants: None   ? ?CAD  ?HLD ?- Cardiac catheterization this admission showed severe single-vessel CAD with chronic total occlusion of mid LAD with right-to-left and left-to-left collaterals. Also showed moderate Lcx and RCA disease  ?- Eventually, may consider trying to open CTO of her LAD. However, Cardiac MRI this admission showed a prior infarct in LAD territory (basal anteroseptum, mid ?anterior/anteroseptal, apical anterior/septal, and apex). LGE greater tahn 50% transmural suggesting nonviability in this territory  ?- Continue daily ASA ?- Continue lipitor 80 mg daily, fenofibrate 160 mg daily   ?- Started Imdur 30 mg daily this admission, can titrate as needed for chest discomfort  ?- Started metoprolol 25 mg daily this admission  ?  ?Chronic systolic heart failure  ?Chronic BLE edema  ?- Echo this admission showed LVEF 30-35% ?- Creatinine mproved with diuresis  ?- Patient given IV lasix 40 mg IV daily while admitted. Output 3.325 L urine the day prior to admission with improvement in lower extremity edema, patient reports edema is back to baseline  ?- Patient started on entresto 24-26 on 4/19. BP and renal function stable  ?- Continue Jardiance  ?- Continue metoprolol as above  ?- Continue home dose of PO lasix, 40 mg daily. Consider increasing to BID at follow up appointment if renal function allows   ?- Patient reports that she has been taking digoxin 0.125 since 1997, unsure if she has been monitored. Has not been on digoxin this admission and is doing well. Will stop digoxin for now at  discharge, can reassess needs at follow up appointment  ?- Ordered outpatient BMP for the week following discharge to continue to monitor renal function ?- Encouraged patient to take her blood pressure at home and keep a log until her follow up appointment   ?  ?Chronic Renal Insufficiency  ?- Creatinine elevated to 1.64 on admission, now down to 1.32 with diuresis  ?- Creatinine stable after addition of entresto  ?- Appears patient was taking HCTZ prior to admission, will discontinue at discharge due to addition of entresto  ?  ?Type 2 Diabetes Mellitus  ?- Continue home regiment  ? ?Did the patient have an acute coronary syndrome (MI, NSTEMI, STEMI, etc) this admission?:  Yes                              ? ?AHA/ACC Clinical Performance & Quality Measures: ?Aspirin prescribed? - Yes ?ADP Receptor Inhibitor (Plavix/Clopidogrel, Brilinta/Ticagrelor or Effient/Prasugrel) prescribed (includes medically managed patients)? - No - No stents placed ?Beta Blocker prescribed? - Yes ?High Intensity Statin (Lipitor 40-80mg  or Crestor 20-40mg ) prescribed? - Yes ?EF assessed during THIS hospitalization? - Yes ?For EF <40%, was ACEI/ARB prescribed? - Yes ?For EF <40%, Aldosterone Antagonist (Spironolactone or Eplerenone) prescribed? - No - Reason:  Reduced renal function  ?Cardiac Rehab  Phase II ordered (including medically managed patients)? - Yes  ? ?   ?Patient has a follow up appointment on 5/5  ? ?Physical Exam ?Constitutional:   ?   General: She is not in acute distress. ?   Appearance: She is not toxic-appearing.  ?HENT:  ?   Head: Normocephalic and atraumatic.  ?   Nose: Nose normal.  ?Eyes:  ?   Extraocular Movements: Extraocular movements intact.  ?   Conjunctiva/sclera: Conjunctivae normal.  ?Cardiovascular:  ?   Rate and Rhythm: Normal rate and regular rhythm.  ?Pulmonary:  ?   Effort: Pulmonary effort is normal.  ?   Breath sounds: Normal breath sounds.  ?Abdominal:  ?   General: Abdomen is flat.  ?   Palpations:  Abdomen is soft.  ?Musculoskeletal:     ?   General: Swelling (2+ pitting in BLE) present. No tenderness.  ?   Cervical back: Neck supple.  ?Skin: ?   General: Skin is warm and dry.  ?Neurological:  ?   General:

## 2022-01-01 ENCOUNTER — Ambulatory Visit (INDEPENDENT_AMBULATORY_CARE_PROVIDER_SITE_OTHER): Payer: Medicare Other | Admitting: Cardiology

## 2022-01-01 VITALS — BP 130/64 | HR 95 | Ht 66.0 in | Wt 176.0 lb

## 2022-01-01 DIAGNOSIS — R609 Edema, unspecified: Secondary | ICD-10-CM | POA: Diagnosis not present

## 2022-01-01 DIAGNOSIS — I2510 Atherosclerotic heart disease of native coronary artery without angina pectoris: Secondary | ICD-10-CM

## 2022-01-01 DIAGNOSIS — I5042 Chronic combined systolic (congestive) and diastolic (congestive) heart failure: Secondary | ICD-10-CM

## 2022-01-01 DIAGNOSIS — I429 Cardiomyopathy, unspecified: Secondary | ICD-10-CM

## 2022-01-01 DIAGNOSIS — E785 Hyperlipidemia, unspecified: Secondary | ICD-10-CM | POA: Diagnosis not present

## 2022-01-01 DIAGNOSIS — E118 Type 2 diabetes mellitus with unspecified complications: Secondary | ICD-10-CM

## 2022-01-01 DIAGNOSIS — I255 Ischemic cardiomyopathy: Secondary | ICD-10-CM | POA: Diagnosis not present

## 2022-01-01 HISTORY — DX: Cardiomyopathy, unspecified: I42.9

## 2022-01-01 NOTE — Patient Instructions (Signed)
Medication Instructions:  ?Your physician recommends that you continue on your current medications as directed. Please refer to the Current Medication list given to you today.  ?*If you need a refill on your cardiac medications before your next appointment, please call your pharmacy* ? ? ?Lab Work: ?BMP Today ?If you have labs (blood work) drawn today and your tests are completely normal, you will receive your results only by: ?MyChart Message (if you have MyChart) OR ?A paper copy in the mail ?If you have any lab test that is abnormal or we need to change your treatment, we will call you to review the results. ? ? ?Testing/Procedures: ?None Ordered ? ? ?Follow-Up: ?At [facility], you and your health needs are our priority.  As part of our continuing mission to provide you with exceptional heart care, we have created designated Provider Care Teams.  These Care Teams include your primary Cardiologist (physician) and Advanced Practice Providers (APPs -  Physician Assistants and Nurse Practitioners) who all work together to provide you with the care you need, when you need it. ? ?We recommend signing up for the patient portal called "MyChart".  Sign up information is provided on this After Visit Summary.  MyChart is used to connect with patients for Virtual Visits (Telemedicine).  Patients are able to view lab/test results, encounter notes, upcoming appointments, etc.  Non-urgent messages can be sent to your provider as well.   ?To learn more about what you can do with MyChart, go to [email or web address].   ? ?Your next appointment:   ?4 week(s) ? ?The format for your next appointment:   ?In Person ? ?Provider:   ?Gypsy Balsam, MD  ? ? ?Other Instructions ?NA  ?

## 2022-01-01 NOTE — Progress Notes (Signed)
?Cardiology Office Note:   ? ?Date:  01/01/2022  ? ?ID:  Jarrett Chicoine, DOB 06/14/1941, MRN [id number] ? ?PCP:  [name], MD  ?Cardiologist:  Gypsy Balsam, MD   ? ?Referring MD: [name], MD  ? ?Chief Complaint  ?Patient presents with  ? Follow-up  ? ? ?History of Present Illness:   ? ?Angela Lucero is a 81 y.o. female with past medical history significant for coronary artery disease, she had PTCA and stenting done in 1999 as well as 2005, chronic smoker, COPD, congestive heart failure with ischemic cardiomyopathy ejection fraction 30 to 35%, diabetes, dyslipidemia.  I did see her.  Multiple month ago when she came to me to be evaluated before elective surgery for her knee she did have multiple risk factors for coronary artery disease lack of typical symptomatology and the idea was to do stress test however 2 days after her visit in my office she end up going to [facility] because of chest pain she ruled in for myocardial infarction with blood test troponin, eventually end up being transferred to [facility] for cardiac catheterization, that cardiac catheterization revealed completely occluded mid LAD.  After that she had MRI done and MRI showed scar in the LAD territory.  Initial impression was we may try to attempt to open LAD however after the MRI done that the idea was abandoned.  She was diuresed put on appropriate medication she is being discharged home. ?Overall she is doing better she said shortness of breath is better.  Swelling of lower extremities still there she still got about 2+ pitting edema both legs.  Denies have any chest pain tightness squeezing pressure burning chest.  She is upset about the fact that most likely she will not be able to have her surgery. ? ?Past Medical History:  ?Diagnosis Date  ? Arthritis 2010  ? Lower back and neck  ? CAD (coronary artery disease)   ? COPD (chronic obstructive pulmonary disease) (HCC)   ? CVA (cerebral vascular accident) [facility])   ? 1997, 2005  stents  ? Diabetes (HCC)   ? Diabetes mellitus, type II (HCC)   ? Gout 1998  ? Heart attack (HCC)   ? 7564,3329  ? Hyperlipidemia   ? Hypertension   ? Hypertriglyceridemia   ? ? ?Past Surgical History:  ?Procedure Laterality Date  ? APPENDECTOMY  1971  ? CHOLECYSTECTOMY    ? LEFT HEART CATH AND CORONARY ANGIOGRAPHY N/A 12/14/2021  ? Procedure: LEFT HEART CATH AND CORONARY ANGIOGRAPHY;  Surgeon: Yvonne Kendall, MD;  [facility] IN[facility];  Service: Cardiovascular;  Laterality: N/A;  ? Navel hernia repair  2007  ? PARTIAL HYSTERECTOMY  1976  ? Stent Heart    ? TUBAL LIGATION  1974  ? Ulna nerve transpost L arm    ? ? ?Current Medications: ?Current Meds  ?Medication Sig  ? acetaminophen (TYLENOL) 500 MG tablet Take 500 mg by mouth every 6 (six) hours as needed for mild pain.  ? albuterol (VENTOLIN HFA) 108 (90 Base) MCG/ACT inhaler Inhale 2 puffs into the lungs every 6 (six) hours as needed for wheezing or shortness of breath.  ? amLODipine (NORVASC) 10 MG tablet Take 10 mg by mouth daily.  ? aspirin EC 81 MG tablet Take 81 mg by mouth daily. Swallow whole.  ? atorvastatin (LIPITOR) 80 MG tablet Take 80 mg by mouth daily.  ? Cetirizine-Pseudoephedrine (ALLERGY RELIEF D PO) Take 1 tablet by mouth daily.  ? Cholecalciferol (VITAMIN D3)  250 MCG (10000 UT) TABS Take 10,000 Units by mouth in the morning and at bedtime.  ? empagliflozin (JARDIANCE) 25 MG TABS tablet Take 25 mg by mouth daily.  ? fenofibrate 160 MG tablet Take 160 mg by mouth daily.  ? furosemide (LASIX) 40 MG tablet Take 40 mg by mouth daily.  ? gabapentin (NEURONTIN) 300 MG capsule Take 300 mg by mouth 2 (two) times daily.  ? glimepiride (AMARYL) 2 MG tablet Take 2 mg by mouth daily with breakfast.  ? isosorbide mononitrate (IMDUR) 30 MG 24 hr tablet Take 1 tablet (30 mg total) by mouth daily.  ? meloxicam (MOBIC) 7.5 MG tablet Take 7.5 mg by mouth daily.  ? methocarbamol (ROBAXIN) 750 MG tablet Take 750 mg by mouth 3 (three) times daily as needed  for spasms.  ? metoprolol succinate (TOPROL-XL) 25 MG 24 hr tablet Take 1 tablet (25 mg total) by mouth daily.  ? Multiple Vitamin (MULTIVITAMIN ADULT PO) Take 1 tablet by mouth daily.  ? Multiple Vitamins-Minerals (PRESERVISION AREDS 2) CAPS Take 1 tablet by mouth daily.  ? Omega 3 1000 MG CAPS Take 2 capsules by mouth in the morning and at bedtime.  ? sacubitril-valsartan (ENTRESTO) 24-26 MG Take 1 tablet by mouth 2 (two) times daily.  ? tiotropium (SPIRIVA) 18 MCG inhalation capsule Place 18 mcg into inhaler and inhale daily.  ? traMADol (ULTRAM) 50 MG tablet Take 50 mg by mouth every 6 (six) hours as needed for moderate pain.  ?  ? ?Allergies:   Patient has no known allergies.  ? ?Social History  ? ?Socioeconomic History  ? Marital status: Widowed  ?  Spouse name: Not on file  ? Number of children: Not on file  ? Years of education: Not on file  ? Highest education level: Not on file  ?Occupational History  ? Not on file  ?Tobacco Use  ? Smoking status: Never  ? Smokeless tobacco: Never  ?Substance and Sexual Activity  ? Alcohol use: Not Currently  ? Drug use: Never  ? Sexual activity: Not Currently  ?Other Topics Concern  ? Not on file  ?Social History Narrative  ? Not on file  ? ?Social Determinants of Health  ? ?Financial Resource Strain: Not on file  ?Food Insecurity: Not on file  ?Transportation Needs: Not on file  ?Physical Activity: Not on file  ?Stress: Not on file  ?Social Connections: Not on file  ?  ? ?Family History: ?The patient's family history includes Cancer in her brother; Heart disease in her father and mother; Hypertension in her father and mother. ?ROS:   ?Please see the history of present illness.    ?All 14 point review of systems negative except as described per history of present illness ? ?EKGs/Labs/Other Studies Reviewed:   ? ? ? ?Recent Labs: ?12/10/2021: NT-Pro BNP 5,218 ?12/18/2021: BUN 30; Creatinine, Ser 1.32; Hemoglobin 12.3; Magnesium 2.0; Platelets 188; Potassium 4.0; Sodium 134   ?Recent Lipid Panel ?No results found for: CHOL, TRIG, HDL, CHOLHDL, VLDL, LDLCALC, LDLDIRECT ? ?Physical Exam:   ? ?VS:  BP 130/64 (BP Location: Left Arm, Patient Position: Sitting)   Pulse 95   Ht 5\' 6"  (1.676 m)   Wt 176 lb (79.8 kg)   SpO2 99%   BMI 28.41 kg/m?    ? ?Wt Readings from Last 3 Encounters:  ?01/01/22 176 lb (79.8 kg)  ?12/14/21 178 lb 9.2 oz (81 kg)  ?12/10/21 178 lb 9.6 oz (81 kg)  ?  ? ?GEN:  Well nourished, well developed in no acute distress ?HEENT: Normal ?NECK: No JVD; No carotid bruits ?LYMPHATICS: No lymphadenopathy ?CARDIAC: RRR, no murmurs, no rubs, no gallops ?RESPIRATORY:  Clear to auscultation without rales, wheezing or rhonchi  ?ABDOMEN: Soft, non-tender, non-distended ?MUSCULOSKELETAL:  No edema; No deformity  ?SKIN: Warm and dry ?LOWER EXTREMITIES: no swelling ?NEUROLOGIC:  Alert and oriented x 3 ?PSYCHIATRIC:  Normal affect  ? ?ASSESSMENT:   ? ?1. Coronary artery disease involving native coronary artery of native heart without angina pectoris   ?2. Ischemic cardiomyopathy   ?3. Chronic combined systolic and diastolic congestive heart failure (HCC)   ?4. Type 2 diabetes mellitus with complication, without long-term current use of insulin (HCC)   ?5. Dyslipidemia   ?6. Chronic edema   ? ?PLAN:   ? ?In order of problems listed above: ? ?Coronary disease status post PTCA and stenting done many years ago, last cardiac catheterization done in December 14, 2021 showed completely occluded mid LAD.  However elevation of troponin was only very mild therefore we doubt very much that this occlusion of the artery is responsible for her symptomatology last time.  She is being managed medically.  She is on antiplatelet therapy which I will continue, she is also on statin which I will continue. ?Cardiomyopathy ejection fraction 30 to 35%.  She is still in the process of being diuresed.  She is only on 40 mg furosemide, she is also on Imdur 30 Entresto 24/26 twice daily as well as Toprol-XL to  only 25.  I will ask her to have Chem-7 done today based on Chem-7 will decide if he can increase dose of diuretics in the future hopefully be able to increase dose of Entresto. ?COPD, sadly she still continues to

## 2022-01-02 LAB — BASIC METABOLIC PANEL
BUN/Creatinine Ratio: 25 (ref 12–28)
BUN: 36 mg/dL — ABNORMAL HIGH (ref 8–27)
CO2: 23 mmol/L (ref 20–29)
Calcium: 9.9 mg/dL (ref 8.7–10.3)
Chloride: 100 mmol/L (ref 96–106)
Creatinine, Ser: 1.43 mg/dL — ABNORMAL HIGH (ref 0.57–1.00)
Glucose: 182 mg/dL — ABNORMAL HIGH (ref 70–99)
Potassium: 5.2 mmol/L (ref 3.5–5.2)
Sodium: 137 mmol/L (ref 134–144)
eGFR: 37 mL/min/{1.73_m2} — ABNORMAL LOW (ref >59)

## 2022-01-05 ENCOUNTER — Other Ambulatory Visit: Payer: Self-pay

## 2022-01-05 ENCOUNTER — Telehealth: Payer: Self-pay | Admitting: Cardiology

## 2022-01-05 ENCOUNTER — Ambulatory Visit: Payer: Medicare Other | Admitting: Cardiology

## 2022-01-05 MED ORDER — SACUBITRIL-VALSARTAN 24-26 MG PO TABS: 1.0000 | ORAL_TABLET | Freq: Two times a day (BID) | ORAL | 3 refills | Status: DC

## 2022-01-05 NOTE — Telephone Encounter (Signed)
?*  STAT* If patient is at the pharmacy, call can be transferred to refill team. ? ? ?1. Which medications need to be refilled? (please list name of each medication and dose if known) sacubitril-valsartan (ENTRESTO) 24-26 MG ? ?2. Which pharmacy/location (including street and city if local pharmacy) is medication to be sent to? [facility] - [place], [place] - [address] ? ?3. Do they need a 30 day or 90 day supply? 90 day  ?

## 2022-01-05 NOTE — Telephone Encounter (Signed)
Entresto 24-26 #180 3 ref sent to [facility] pharmacy. ?

## 2022-01-06 ENCOUNTER — Telehealth: Payer: Self-pay

## 2022-01-06 DIAGNOSIS — R609 Edema, unspecified: Secondary | ICD-10-CM

## 2022-01-06 MED ORDER — FUROSEMIDE 40 MG PO TABS: 60.0000 mg | ORAL_TABLET | Freq: Every day | ORAL | 0 refills | Status: DC

## 2022-01-06 NOTE — Telephone Encounter (Signed)
Patient notified of results and recommendations and agreed with plan. Rx sent.  

## 2022-01-06 NOTE — Telephone Encounter (Signed)
-----   Message from Georgeanna Lea, MD sent at 01/05/2022  4:48 PM EDT ----- ?Kidney function slightly worse but still fine please increase furosemide to 1-1/2 tablets of 40 mg which will make 60 mg daily, Chem-7 need to be done in about 2-week ?

## 2022-01-08 ENCOUNTER — Telehealth: Payer: Self-pay | Admitting: Cardiology

## 2022-01-08 DIAGNOSIS — I5042 Chronic combined systolic (congestive) and diastolic (congestive) heart failure: Secondary | ICD-10-CM | POA: Diagnosis not present

## 2022-01-08 DIAGNOSIS — Z1331 Encounter for screening for depression: Secondary | ICD-10-CM | POA: Diagnosis not present

## 2022-01-08 DIAGNOSIS — Z720 Tobacco use: Secondary | ICD-10-CM | POA: Diagnosis not present

## 2022-01-08 DIAGNOSIS — I2510 Atherosclerotic heart disease of native coronary artery without angina pectoris: Secondary | ICD-10-CM | POA: Diagnosis not present

## 2022-01-08 NOTE — Telephone Encounter (Signed)
? ?  Pre-operative Risk Assessment  ?  ?Patient Name: Angela Lucero  ?DOB: 11/25/1940 ?MRN: [id number]  ? ?  ? ?Request for Surgical Clearance   ? ?Procedure:   Left total Knee  ? ?Date of Surgery:  Clearance TBD                              ?   ?Surgeon:  Dr. Donivan Scull ?Surgeon's Group or Practice Name:   [facility] ?Phone number:  [phone] ?Fax number:  [phone] ?  ?Type of Clearance Requested:   ?- Medical  ?  ?Type of Anesthesia:  General  ?  ?Additional requests/questions:     ? ?Signed, ?Milbert Coulter   ?01/08/2022, 10:45 AM  ? ?

## 2022-01-08 NOTE — Telephone Encounter (Signed)
Pre op clearance needed has been added to appt notes. I will update the requesting the office the pt will need to see Dr. Bing Matter 01/29/22.  ?

## 2022-01-08 NOTE — Telephone Encounter (Signed)
Preoperative team, patient was fluid volume overloaded and in the process of diuresis during cardiology appointment last week.  She has follow-up appointment next month with Dr. Bing Matter.  I will defer preoperative cardiac evaluation and recommendations to him at that time.  Please add preoperative cardiac evaluation to her appointment note.  Thank you. ? ?Thomasene Ripple. Kennadi Albany NP-C ? ?  ?01/08/2022, 1:17 PM ?[facility] ?[address] ?Office [phone] Fax [phone] ? ?

## 2022-01-29 ENCOUNTER — Ambulatory Visit: Payer: Medicare Other | Admitting: Cardiology

## 2022-02-09 ENCOUNTER — Telehealth: Payer: Self-pay

## 2022-02-09 DIAGNOSIS — I509 Heart failure, unspecified: Secondary | ICD-10-CM

## 2022-02-09 DIAGNOSIS — R609 Edema, unspecified: Secondary | ICD-10-CM | POA: Diagnosis not present

## 2022-02-09 DIAGNOSIS — R0609 Other forms of dyspnea: Secondary | ICD-10-CM | POA: Diagnosis not present

## 2022-02-09 LAB — BASIC METABOLIC PANEL
BUN/Creatinine Ratio: 28 (ref 12–28)
BUN: 39 mg/dL — ABNORMAL HIGH (ref 8–27)
CO2: 23 mmol/L (ref 20–29)
Calcium: 9.6 mg/dL (ref 8.7–10.3)
Chloride: 99 mmol/L (ref 96–106)
Creatinine, Ser: 1.39 mg/dL — ABNORMAL HIGH (ref 0.57–1.00)
Glucose: 88 mg/dL (ref 70–99)
Potassium: 4.9 mmol/L (ref 3.5–5.2)
Sodium: 138 mmol/L (ref 134–144)
eGFR: 39 mL/min/{1.73_m2} — ABNORMAL LOW (ref >59)

## 2022-02-09 LAB — PRO B NATRIURETIC PEPTIDE: NT-Pro BNP: 3370 pg/mL — ABNORMAL HIGH (ref 0–738)

## 2022-02-09 NOTE — Telephone Encounter (Signed)
Pt advised of labs and discussed with Dr. Tomie China. Pt will take an extra 20 mg Lasix and continue to watch her sodium in her diet. Pt verbalized understanding and had no additional questions.

## 2022-02-09 NOTE — Telephone Encounter (Signed)
Pt came in for blood work. Her ankles were extremely swollen x 3 days, weight gain 3lbs, some shortness of breath- she reports no worse than usual. Her blood pressures are running 130/60 range. Onnie Boer, RN also assessed pt. Blood work drawn. BMP, BNP results will be sent to Dr. Kelly Splinter. Pt has previously been on Lasix 80mg , reduced by PCP to 40mg  then increased back to 60mg  by RJK.

## 2022-02-24 ENCOUNTER — Encounter: Payer: Self-pay | Admitting: Cardiology

## 2022-02-24 ENCOUNTER — Ambulatory Visit (INDEPENDENT_AMBULATORY_CARE_PROVIDER_SITE_OTHER): Payer: Medicare Other | Admitting: Cardiology

## 2022-02-24 VITALS — BP 136/68 | HR 104 | Ht 66.0 in | Wt 182.2 lb

## 2022-02-24 DIAGNOSIS — I5042 Chronic combined systolic (congestive) and diastolic (congestive) heart failure: Secondary | ICD-10-CM | POA: Diagnosis not present

## 2022-02-24 DIAGNOSIS — F17200 Nicotine dependence, unspecified, uncomplicated: Secondary | ICD-10-CM | POA: Diagnosis not present

## 2022-02-24 DIAGNOSIS — I255 Ischemic cardiomyopathy: Secondary | ICD-10-CM

## 2022-02-24 DIAGNOSIS — E118 Type 2 diabetes mellitus with unspecified complications: Secondary | ICD-10-CM | POA: Diagnosis not present

## 2022-02-24 DIAGNOSIS — E785 Hyperlipidemia, unspecified: Secondary | ICD-10-CM | POA: Diagnosis not present

## 2022-02-24 DIAGNOSIS — I2510 Atherosclerotic heart disease of native coronary artery without angina pectoris: Secondary | ICD-10-CM | POA: Diagnosis not present

## 2022-02-24 MED ORDER — AMLODIPINE BESYLATE 5 MG PO TABS: 5.0000 mg | ORAL_TABLET | Freq: Every day | ORAL | 3 refills | Status: DC

## 2022-02-24 MED ORDER — ISOSORBIDE MONONITRATE ER 60 MG PO TB24: 60.0000 mg | ORAL_TABLET | Freq: Every day | ORAL | 3 refills | Status: DC

## 2022-02-24 NOTE — Progress Notes (Unsigned)
Cardiology Office Note:    Date:  02/24/2022   ID:  Angela Lucero, DOB 1940/09/04, MRN [id number]  PCP:  [name], MD  Cardiologist:  Gypsy Balsam, MD    Referring MD: [name], MD   Chief Complaint  Patient presents with   Follow-up   Medication Refill    Imdur and Metoprolol    History of Present Illness:    Angela Lucero is a 81 y.o. female   with past medical history significant for coronary artery disease, she had PTCA and stenting done in 1999 as well as 2005, chronic smoker, COPD, congestive heart failure with ischemic cardiomyopathy ejection fraction 30 to 35%, diabetes, dyslipidemia.  I did see her.  Multiple month ago when she came to me to be evaluated before elective surgery for her knee she did have multiple risk factors for coronary artery disease lack of typical symptomatology and the idea was to do stress test however 2 days after her visit in my office she end up going to [facility] because of chest pain she ruled in for myocardial infarction with blood test troponin, eventually end up being transferred to [facility] for cardiac catheterization, that cardiac catheterization revealed completely occluded mid LAD.  After that she had MRI done and MRI showed scar in the LAD territory.  Initial impression was we may try to attempt to open LAD however after the MRI done that the idea was abandoned.  She was diuresed put on appropriate medication she is being discharged home. Comes today to my office for follow-up overall she doing fine swelling of lower extremities still there is still bothering her some.  Denies of any chest pain tightness squeezing pressure burning chest no shortness of breath at least she still continues to smoke  Past Medical History:  Diagnosis Date   Arthritis 2010   Lower back and neck   CAD (coronary artery disease)    COPD (chronic obstructive pulmonary disease) (HCC)    CVA (cerebral vascular accident) (HCC)    1997, 2005 stents    Diabetes (HCC)    Diabetes mellitus, type II (HCC)    Gout 1998   Heart attack (HCC)    3474,2595   Hyperlipidemia    Hypertension    Hypertriglyceridemia     Past Surgical History:  Procedure Laterality Date   APPENDECTOMY  1971   CHOLECYSTECTOMY     LEFT HEART CATH AND CORONARY ANGIOGRAPHY N/A 12/14/2021   Procedure: LEFT HEART CATH AND CORONARY ANGIOGRAPHY;  Surgeon: Yvonne Kendall, MD;  Location: [facility] INVASIVE CV LAB;  Service: Cardiovascular;  Laterality: N/A;   Navel hernia repair  2007   PARTIAL HYSTERECTOMY  1976   Stent Heart     TUBAL LIGATION  1974   Ulna nerve transpost L arm      Current Medications: Current Meds  Medication Sig   acetaminophen (TYLENOL) 500 MG tablet Take 500 mg by mouth every 6 (six) hours as needed for mild pain.   albuterol (VENTOLIN HFA) 108 (90 Base) MCG/ACT inhaler Inhale 2 puffs into the lungs every 6 (six) hours as needed for wheezing or shortness of breath.   amLODipine (NORVASC) 10 MG tablet Take 10 mg by mouth daily.   aspirin EC 81 MG tablet Take 81 mg by mouth daily. Swallow whole.   atorvastatin (LIPITOR) 80 MG tablet Take 80 mg by mouth daily.   Cetirizine-Pseudoephedrine (ALLERGY RELIEF D PO) Take 1 tablet by mouth daily.   Cholecalciferol (VITAMIN D3) 250 MCG (  10000 UT) TABS Take 10,000 Units by mouth in the morning and at bedtime.   empagliflozin (JARDIANCE) 25 MG TABS tablet Take 25 mg by mouth daily.   fenofibrate 160 MG tablet Take 160 mg by mouth daily.   furosemide (LASIX) 40 MG tablet Take 1.5 tablets (60 mg total) by mouth daily.   gabapentin (NEURONTIN) 300 MG capsule Take 300 mg by mouth 2 (two) times daily.   glimepiride (AMARYL) 2 MG tablet Take 2 mg by mouth daily with breakfast.   isosorbide mononitrate (IMDUR) 30 MG 24 hr tablet Take 1 tablet (30 mg total) by mouth daily.   meloxicam (MOBIC) 7.5 MG tablet Take 7.5 mg by mouth daily.   methocarbamol (ROBAXIN) 750 MG tablet Take 750 mg by mouth 3 (three) times daily  as needed for spasms.   metoprolol succinate (TOPROL-XL) 25 MG 24 hr tablet Take 1 tablet (25 mg total) by mouth daily.   Multiple Vitamin (MULTIVITAMIN ADULT PO) Take 1 tablet by mouth daily.   Multiple Vitamins-Minerals (PRESERVISION AREDS 2) CAPS Take 1 tablet by mouth daily.   Omega 3 1000 MG CAPS Take 2 capsules by mouth in the morning and at bedtime.   sacubitril-valsartan (ENTRESTO) 24-26 MG Take 1 tablet by mouth 2 (two) times daily.   tiotropium (SPIRIVA) 18 MCG inhalation capsule Place 18 mcg into inhaler and inhale daily.   traMADol (ULTRAM) 50 MG tablet Take 50 mg by mouth every 6 (six) hours as needed for moderate pain.     Allergies:   Patient has no known allergies.   Social History   Socioeconomic History   Marital status: Widowed    Spouse name: Not on file   Number of children: Not on file   Years of education: Not on file   Highest education level: Not on file  Occupational History   Not on file  Tobacco Use   Smoking status: Never   Smokeless tobacco: Never  Substance and Sexual Activity   Alcohol use: Not Currently   Drug use: Never   Sexual activity: Not Currently  Other Topics Concern   Not on file  Social History Narrative   Not on file   Social Determinants of Health   Financial Resource Strain: Not on file  Food Insecurity: Not on file  Transportation Needs: Not on file  Physical Activity: Not on file  Stress: Not on file  Social Connections: Not on file     Family History: The patient's family history includes Cancer in her brother; Heart disease in her father and mother; Hypertension in her father and mother. ROS:   Please see the history of present illness.    All 14 point review of systems negative except as described per history of present illness  EKGs/Labs/Other Studies Reviewed:      Recent Labs: 12/18/2021: Hemoglobin 12.3; Magnesium 2.0; Platelets 188 02/09/2022: BUN 39; Creatinine, Ser 1.39; NT-Pro BNP 3,370; Potassium 4.9;  Sodium 138  Recent Lipid Panel No results found for: "CHOL", "TRIG", "HDL", "CHOLHDL", "VLDL", "LDLCALC", "LDLDIRECT"  Physical Exam:    VS:  BP 136/68 (BP Location: Left Arm, Patient Position: Sitting)   Pulse (!) 104   Ht 5\' 6"  (1.676 m)   Wt 182 lb 3.2 oz (82.6 kg)   SpO2 95%   BMI 29.41 kg/m     Wt Readings from Last 3 Encounters:  02/24/22 182 lb 3.2 oz (82.6 kg)  01/01/22 176 lb (79.8 kg)  12/14/21 178 lb 9.2 oz (81 kg)  GEN:  Well nourished, well developed in no acute distress HEENT: Normal NECK: No JVD; No carotid bruits LYMPHATICS: No lymphadenopathy CARDIAC: RRR, no murmurs, no rubs, no gallops RESPIRATORY:  Clear to auscultation without rales, wheezing or rhonchi  ABDOMEN: Soft, non-tender, non-distended MUSCULOSKELETAL:  No edema; No deformity  SKIN: Warm and dry LOWER EXTREMITIES: 2+ swelling NEUROLOGIC:  Alert and oriented x 3 PSYCHIATRIC:  Normal affect   ASSESSMENT:    1. Coronary artery disease involving native coronary artery of native heart without angina pectoris   2. Ischemic cardiomyopathy   3. Chronic combined systolic and diastolic congestive heart failure ([place])   4. Type 2 diabetes mellitus with complication, without long-term current use of insulin (HCC)   5. Smoking   6. Dyslipidemia    PLAN:    In order of problems listed above:  Coronary artery disease stable from that point review.  Continue present medication which include antiplatelets therapy as well as Lipitor, Cardiomyopathy which is ischemic, having difficulty putting her on appropriate medication because of kidney dysfunction even though she is able to tolerate small dose of Entresto.  I will do kidney function checked today.  She does have some swelling of lower extremities I hope we will be able to increase dose of Lasix look like she tells me she is taking 80 mg daily I will check her Chem-7 today and decide about potentially augmenting the therapy in the meantime we will  continue with Entresto, metoprolol XL 25 daily, she is on Jardiance already no room for Aldactone.  Will cut down her amlodipine today by half to only 5 mg because of significant swelling I will double the dose of isosorbide mononitrate Chronic combined systolic diastolic congestive heart rate, swelling of lower extremities still present plan as described above Smoking obviously huge problem we did discuss this I strongly recommend to quit. Dyslipidemia I do have her K PN which show me LDL of 78 HDL 29.  She is on Lipitor 80 and fenofibrate I will ask her to have direct LDL done today   Medication Adjustments/Labs and Tests Ordered: Current medicines are reviewed at length with the patient today.  Concerns regarding medicines are outlined above.  No orders of the defined types were placed in this encounter.  Medication changes: No orders of the defined types were placed in this encounter.   Signed, Park Liter, MD, [facility] 02/24/2022 10:59 AM    [place]

## 2022-02-24 NOTE — Patient Instructions (Signed)
Medication Instructions:  Your physician has recommended you make the following change in your medication:   START: Amlodipine 5 mg daily START: Imdur 60 mg daily  *If you need a refill on your cardiac medications before your next appointment, please call your pharmacy*   Lab Work: Your physician recommends that you return for lab work in:   Labs today: BMP, Direct LDL  If you have labs (blood work) drawn today and your tests are completely normal, you will receive your results only by: MyChart Message (if you have MyChart) OR A paper copy in the mail If you have any lab test that is abnormal or we need to change your treatment, we will call you to review the results.   Testing/Procedures: None   Follow-Up: At [facility], you and your health needs are our priority.  As part of our continuing mission to provide you with exceptional heart care, we have created designated Provider Care Teams.  These Care Teams include your primary Cardiologist (physician) and Advanced Practice Providers (APPs -  Physician Assistants and Nurse Practitioners) who all work together to provide you with the care you need, when you need it.  We recommend signing up for the patient portal called "MyChart".  Sign up information is provided on this After Visit Summary.  MyChart is used to connect with patients for Virtual Visits (Telemedicine).  Patients are able to view lab/test results, encounter notes, upcoming appointments, etc.  Non-urgent messages can be sent to your provider as well.   To learn more about what you can do with MyChart, go to [email or web address].    Your next appointment:   2 month(s)  The format for your next appointment:   In Person  Provider:   Gypsy Balsam, MD    Other Instructions None  Important Information About Sugar

## 2022-02-25 ENCOUNTER — Telehealth: Payer: Self-pay

## 2022-02-25 DIAGNOSIS — R944 Abnormal results of kidney function studies: Secondary | ICD-10-CM

## 2022-02-25 LAB — BASIC METABOLIC PANEL
BUN/Creatinine Ratio: 29 — ABNORMAL HIGH (ref 12–28)
BUN: 47 mg/dL — ABNORMAL HIGH (ref 8–27)
CO2: 23 mmol/L (ref 20–29)
Calcium: 9.8 mg/dL (ref 8.7–10.3)
Chloride: 97 mmol/L (ref 96–106)
Creatinine, Ser: 1.6 mg/dL — ABNORMAL HIGH (ref 0.57–1.00)
Glucose: 112 mg/dL — ABNORMAL HIGH (ref 70–99)
Potassium: 4.7 mmol/L (ref 3.5–5.2)
Sodium: 137 mmol/L (ref 134–144)
eGFR: 33 mL/min/{1.73_m2} — ABNORMAL LOW (ref >59)

## 2022-02-25 LAB — LDL CHOLESTEROL, DIRECT: LDL Direct: 67 mg/dL (ref 0–99)

## 2022-02-25 NOTE — Telephone Encounter (Signed)
Patient notified of results and recommendations and agreed with plan. Patient  will come by the week of 07/10th. Orders on file

## 2022-02-25 NOTE — Telephone Encounter (Signed)
-----   Message from Georgeanna Lea, MD sent at 02/25/2022  3:13 PM EDT ----- Colestid acceptable, creatinine slightly higher however still within acceptable range.  She is to have another Chem-7 done in about 2 weeks

## 2022-03-31 ENCOUNTER — Other Ambulatory Visit: Payer: Self-pay | Admitting: Cardiology

## 2022-04-28 DIAGNOSIS — E782 Mixed hyperlipidemia: Secondary | ICD-10-CM | POA: Diagnosis not present

## 2022-04-28 DIAGNOSIS — E1169 Type 2 diabetes mellitus with other specified complication: Secondary | ICD-10-CM | POA: Diagnosis not present

## 2022-04-28 DIAGNOSIS — E1122 Type 2 diabetes mellitus with diabetic chronic kidney disease: Secondary | ICD-10-CM | POA: Diagnosis not present

## 2022-04-28 DIAGNOSIS — I5042 Chronic combined systolic (congestive) and diastolic (congestive) heart failure: Secondary | ICD-10-CM | POA: Diagnosis not present

## 2022-04-28 DIAGNOSIS — N1832 Chronic kidney disease, stage 3b: Secondary | ICD-10-CM | POA: Diagnosis not present

## 2022-04-30 ENCOUNTER — Ambulatory Visit: Payer: Medicare Other | Admitting: Cardiology

## 2022-06-07 IMAGING — MR MR CARD MORPHOLOGY WO/W CM
45 of 48 series · 45 of 48 positions shown · IV contrast (Contrast agent)
Comparison: none

CLINICAL DATA: 80F with CAD, T2DM p/w chest pain. Echo showed EF
30-35%,?RA mass. Cath showed CTO mid LAD, patent RCA stent.

EXAM:
CARDIAC MRI
TECHNIQUE: The patient was scanned on a 1.5 Tesla Siemens magnet. A dedicated
cardiac coil was used. Functional imaging was done using [employer]
sequences. [DATE], and 4 chamber views were done to assess for RWMA's.
Modified Lex rule using a short axis stack was used to
calculate an ejection fraction on a dedicated work station using
[employer] software. The patient received 10 cc of Gadavist. After 10
minutes inversion recovery sequences were used to assess for
infiltration and scar tissue.
CONTRAST:  10 cc  of Gadavist

[Series 4: [id number] · axial · 8.0mm · 1.41mm/px · 1 of 16 slices shown]
[im 1/16]
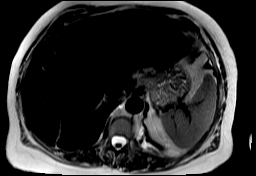

[Series 6: cine_trufi_cs_rt_short axis · oblique · 8.0mm · 1.83mm/px · 1 of 50 slices shown (1 of 22)]
[im 1/50]
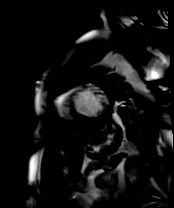

[Series 6: cine_trufi_cs_rt_short axis · oblique · 8.0mm · 1.83mm/px · 1 of 50 slices shown (2 of 22)]
[im 1/50]
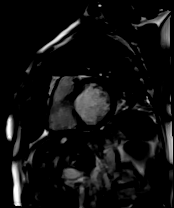

[Series 6: cine_trufi_cs_rt_short axis · oblique · 8.0mm · 1.83mm/px · 1 of 50 slices shown (3 of 22)]
[im 1/50]
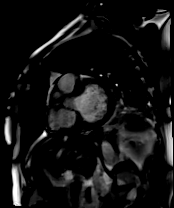

[Series 6: cine_trufi_cs_rt_short axis · oblique · 8.0mm · 1.83mm/px · 1 of 50 slices shown (4 of 22)]
[im 1/50]
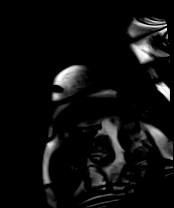

[Series 6: cine_trufi_cs_rt_short axis · oblique · 8.0mm · 1.83mm/px · 1 of 50 slices shown (5 of 22)]
[im 1/50]
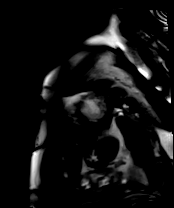

[Series 6: cine_trufi_cs_rt_short axis · oblique · 8.0mm · 1.83mm/px · 1 of 50 slices shown (6 of 22)]
[im 1/50]
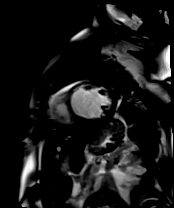

[Series 6: cine_trufi_cs_rt_short axis · oblique · 8.0mm · 1.83mm/px · 1 of 50 slices shown (7 of 22)]
[im 1/50]
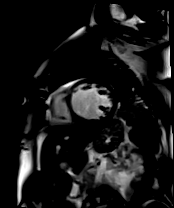

[Series 6: cine_trufi_cs_rt_short axis · oblique · 8.0mm · 1.83mm/px · 1 of 50 slices shown (8 of 22)]
[im 1/50]
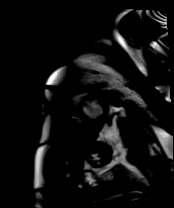

[Series 6: cine_trufi_cs_rt_short axis · oblique · 8.0mm · 1.83mm/px · 1 of 50 slices shown (9 of 22)]
[im 1/50]
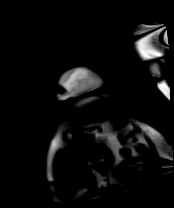

[Series 6: cine_trufi_cs_rt_short axis · oblique · 8.0mm · 1.83mm/px · 1 of 50 slices shown (10 of 22)]
[im 1/50]
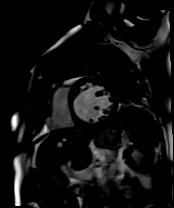

[Series 6: cine_trufi_cs_rt_short axis · oblique · 8.0mm · 1.83mm/px · 1 of 50 slices shown (11 of 22)]
[im 1/50]
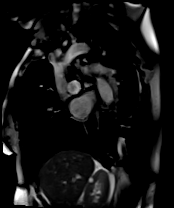

[Series 6: cine_trufi_cs_rt_short axis · oblique · 8.0mm · 1.83mm/px · 1 of 50 slices shown (12 of 22)]
[im 1/50]
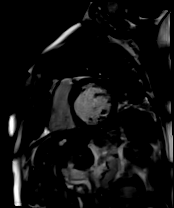

[Series 6: cine_trufi_cs_rt_short axis · oblique · 8.0mm · 1.83mm/px · 1 of 50 slices shown (13 of 22)]
[im 1/50]
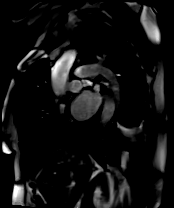

[Series 6: cine_trufi_cs_rt_short axis · oblique · 8.0mm · 1.83mm/px · 1 of 50 slices shown (14 of 22)]
[im 1/50]
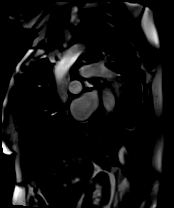

[Series 6: cine_trufi_cs_rt_short axis · oblique · 8.0mm · 1.83mm/px · 1 of 50 slices shown (15 of 22)]
[im 1/50]
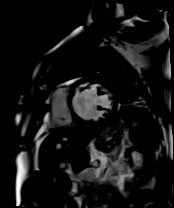

[Series 6: cine_trufi_cs_rt_short axis · oblique · 8.0mm · 1.83mm/px · 1 of 50 slices shown (16 of 22)]
[im 1/50]
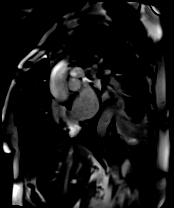

[Series 6: cine_trufi_cs_rt_short axis · oblique · 8.0mm · 1.83mm/px · 1 of 50 slices shown (17 of 22)]
[im 1/50]
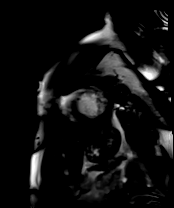

[Series 6: cine_trufi_cs_rt_short axis · oblique · 8.0mm · 1.83mm/px · 1 of 50 slices shown (18 of 22)]
[im 1/50]
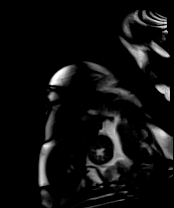

[Series 6: cine_trufi_cs_rt_short axis · oblique · 8.0mm · 1.83mm/px · 1 of 50 slices shown (19 of 22)]
[im 1/50]
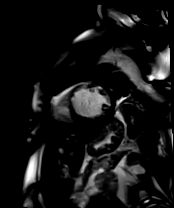

[Series 6: cine_trufi_cs_rt_short axis · oblique · 8.0mm · 1.83mm/px · 1 of 50 slices shown (20 of 22)]
[im 1/50]
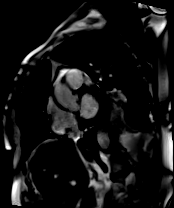

[Series 6: cine_trufi_cs_rt_short axis · oblique · 8.0mm · 1.83mm/px · 1 of 50 slices shown (21 of 22)]
[im 1/50]
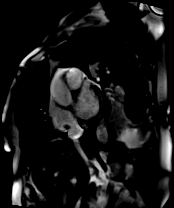

[Series 6: cine_trufi_cs_rt_short axis · oblique · 8.0mm · 1.83mm/px · 1 of 50 slices shown (22 of 22)]
[im 1/50]
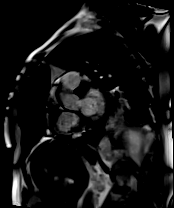

[Series 7: cine_trufi_cs_rt_long axis · axial · 8.0mm · 1.83mm/px · 1 of 14 slices shown (1 of 15)]
[im 1/14]
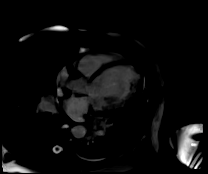

[Series 7: cine_trufi_cs_rt_long axis · axial · 8.0mm · 1.83mm/px · 1 of 14 slices shown (2 of 15)]
[im 1/14]
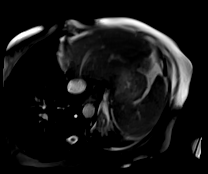

[Series 7: cine_trufi_cs_rt_long axis · axial · 8.0mm · 1.83mm/px · 1 of 14 slices shown (3 of 15)]
[im 1/14]
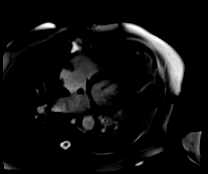

[Series 7: cine_trufi_cs_rt_long axis · axial · 8.0mm · 1.83mm/px · 1 of 14 slices shown (4 of 15)]
[im 1/14]
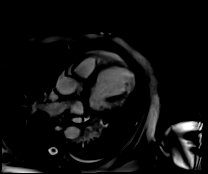

[Series 7: cine_trufi_cs_rt_long axis · axial · 8.0mm · 1.83mm/px · 1 of 14 slices shown (5 of 15)]
[im 1/14]
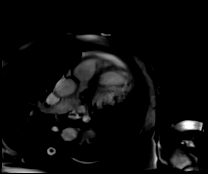

[Series 7: cine_trufi_cs_rt_long axis · axial · 8.0mm · 1.83mm/px · 1 of 14 slices shown (6 of 15)]
[im 1/14]
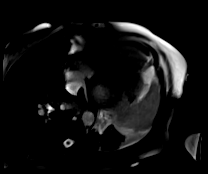

[Series 7: cine_trufi_cs_rt_long axis · axial · 8.0mm · 1.83mm/px · 1 of 14 slices shown (7 of 15)]
[im 1/14]
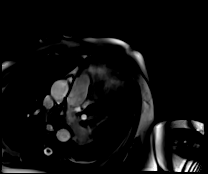

[Series 7: cine_trufi_cs_rt_long axis · axial · 8.0mm · 1.83mm/px · 1 of 14 slices shown (8 of 15)]
[im 1/14]
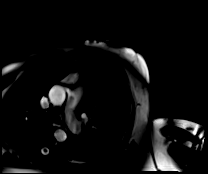

[Series 7: cine_trufi_cs_rt_long axis · axial · 8.0mm · 1.83mm/px · 1 of 14 slices shown (9 of 15)]
[im 1/14]
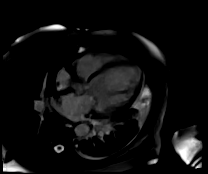

[Series 7: cine_trufi_cs_rt_long axis · axial · 8.0mm · 1.83mm/px · 1 of 14 slices shown (10 of 15)]
[im 1/14]
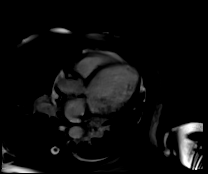

[Series 7: cine_trufi_cs_rt_long axis · axial · 8.0mm · 1.83mm/px · 1 of 14 slices shown (11 of 15)]
[im 1/14]
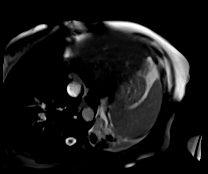

[Series 7: cine_trufi_cs_rt_long axis · axial · 8.0mm · 1.83mm/px · 1 of 14 slices shown (12 of 15)]
[im 1/14]
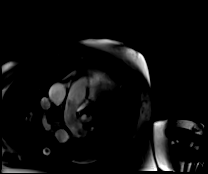

[Series 7: cine_trufi_cs_rt_long axis · axial · 8.0mm · 1.83mm/px · 1 of 14 slices shown (13 of 15)]
[im 1/14]
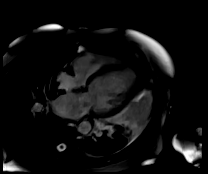

[Series 7: cine_trufi_cs_rt_long axis · axial · 8.0mm · 1.83mm/px · 1 of 14 slices shown (14 of 15)]
[im 1/14]
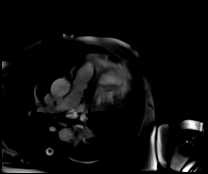

[Series 7: cine_trufi_cs_rt_long axis · axial · 8.0mm · 1.83mm/px · 1 of 14 slices shown (15 of 15)]
[im 1/14]
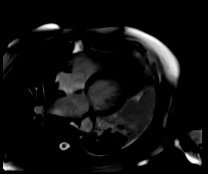

[Series 10: bSSFP · oblique · 8.0mm · 1.61mm/px · 1 of 25 slices shown (1 of 7)]
[im 1/25]
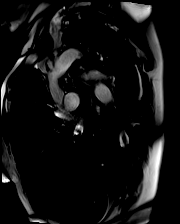

[Series 11: bSSFP · oblique · 8.0mm · 1.61mm/px · 1 of 25 slices shown (2 of 7)]
[im 1/25]
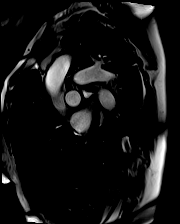

[Series 12: bSSFP · oblique · 8.0mm · 1.61mm/px · 1 of 25 slices shown (3 of 7)]
[im 1/25]
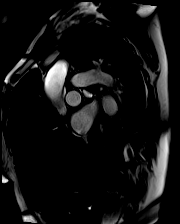

[Series 13: bSSFP · oblique · 8.0mm · 1.61mm/px · 1 of 25 slices shown (4 of 7)]
[im 1/25]
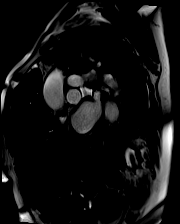

[Series 14: bSSFP · oblique · 8.0mm · 1.61mm/px · 1 of 25 slices shown (5 of 7)]
[im 1/25]
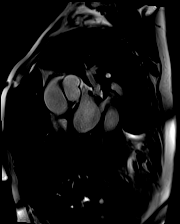

[Series 15: bSSFP · oblique · 8.0mm · 1.61mm/px · 1 of 25 slices shown (6 of 7)]
[im 1/25]
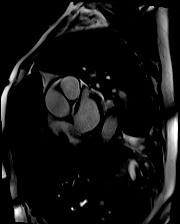

[Series 16: bSSFP · oblique · 8.0mm · 1.61mm/px · 1 of 25 slices shown (7 of 7)]
[im 1/25]
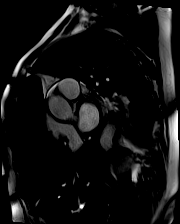

[45 of 48 positions shown; findings below may reference images not displayed]

FINDINGS: Left ventricle:

-Severe dilatation

-Severe systolic dysfunction

-Subendocardial LGE consistent with prior infarct in basal
anteroseptum, mid anterior/anteroseptum, apical anterior/septum, and
apex. LGE >50% transmural suggesting nonviability in this territory

LV EF:  24% (Normal 56-78%)

Absolute volumes:

LV EDV: 335mL (Normal 52-141 mL)

LV ESV: 254mL (Normal 13-51 mL)

LV SV: 80mL (Normal 33-97 mL)

CO: 6.3L/min (Normal 2.7-6.0 L/min)

Indexed volumes:

LV EDV: 172mL/sq-m (Normal 41-81 mL/sq-m)

LV ESV: 131mL/sq-m (Normal 12-21 mL/sq-m)

LV SV: 41mL/sq-m (Normal 26-56 mL/sq-m)

CI: 3.3L/min/sq-m (Normal 1.8-3.8 L/min/sq-m)

Right ventricle: Normal size and systolic function

RV EF: 52% (Normal 47-80%)

Absolute volumes:

RV EDV: 153mL (Normal 58-154 mL)

RV ESV: 74mL (Normal 12-68 mL)

RV SV: 79mL (Normal 35-98 mL)

CO: 6.2L/min (Normal 2.7-6 L/min)

Indexed volumes:

RV EDV: 79mL/sq-m (Normal 48-87 mL/sq-m)

RV ESV: 38mL/sq-m (Normal 11-28 mL/sq-m)

RV SV: 41mL/sq-m (Normal 27-57 mL/sq-m)

CI: 3.2L/min/sq-m (Normal 1.8-3.8 L/min/sq-m)

Left atrium: Moderate enlargement

Right atrium: Mild enlargement.  No mass seen

Mitral valve: Moderate regurgitation (regurgitant fraction 26%)

Aortic valve: Tricuspid.  No regurgitation

Tricuspid valve: Mild regurgitation

Pulmonic valve: No regurgitation

Aorta: Normal proximal ascending aorta

Coronary arteries: Normal origins

Pericardium: Normal

Extracardiac structures: Opacity left base, recommend dedicated lung
imaging
IMPRESSION: 1.  No right atrial mass seen

2. Subendocardial late gadolinium enhancement consistent with prior
infarct in LAD territory (basal anteroseptum, mid
anterior/anteroseptal, apical anterior/septal, and apex). LGE is
greater than 50% transmural suggesting nonviability in this
territory

3.  Severe LV dilatation with severe systolic dysfunction (EF 24%)

4.  Normal RV size and systolic function (EF 52%)

5.  Moderate mitral regurgitation (regurgitant fraction 26%)

6.  Opacity at left lung base, recommend dedicated lung imaging

## 2022-07-13 ENCOUNTER — Ambulatory Visit: Payer: Medicare Other | Admitting: Cardiology

## 2022-07-30 ENCOUNTER — Ambulatory Visit: Payer: Medicare Other | Admitting: Internal Medicine

## 2022-08-11 ENCOUNTER — Other Ambulatory Visit: Payer: Self-pay | Admitting: Internal Medicine

## 2022-08-11 ENCOUNTER — Ambulatory Visit: Payer: Medicare Other | Admitting: Internal Medicine

## 2022-08-11 MED ORDER — TRAMADOL HCL 50 MG PO TABS
50.0000 mg | ORAL_TABLET | Freq: Four times a day (QID) | ORAL | 1 refills | Status: DC | PRN
Start: 2022-08-11 — End: 2022-11-23

## 2022-09-01 ENCOUNTER — Ambulatory Visit: Payer: Medicare Other | Admitting: Internal Medicine

## 2022-09-20 ENCOUNTER — Ambulatory Visit: Payer: Medicare Other | Admitting: Internal Medicine

## 2022-09-29 ENCOUNTER — Ambulatory Visit: Payer: Medicare Other | Admitting: Internal Medicine

## 2022-10-01 ENCOUNTER — Ambulatory Visit: Payer: Medicare Other | Admitting: Cardiology

## 2022-10-04 ENCOUNTER — Other Ambulatory Visit: Payer: Self-pay | Admitting: Internal Medicine

## 2022-10-11 ENCOUNTER — Ambulatory Visit: Payer: Medicare Other | Admitting: Internal Medicine

## 2022-10-20 ENCOUNTER — Ambulatory Visit: Payer: Medicare Other | Admitting: Internal Medicine

## 2022-11-01 ENCOUNTER — Ambulatory Visit: Payer: Medicare Other | Admitting: Internal Medicine

## 2022-11-12 ENCOUNTER — Ambulatory Visit: Payer: Medicare Other | Admitting: Internal Medicine

## 2022-11-16 ENCOUNTER — Other Ambulatory Visit: Payer: Self-pay | Admitting: Internal Medicine

## 2022-11-19 ENCOUNTER — Ambulatory Visit: Payer: Medicare Other | Admitting: Internal Medicine

## 2022-11-24 ENCOUNTER — Ambulatory Visit: Payer: Medicare Other | Admitting: Internal Medicine

## 2022-11-26 ENCOUNTER — Ambulatory Visit: Payer: Medicare Other | Admitting: Cardiology

## 2022-11-29 ENCOUNTER — Ambulatory Visit: Payer: Medicare Other | Admitting: Internal Medicine

## 2022-12-06 ENCOUNTER — Ambulatory Visit: Payer: Medicare Other | Admitting: Internal Medicine

## 2022-12-10 ENCOUNTER — Ambulatory Visit: Payer: Medicare Other | Admitting: Internal Medicine

## 2022-12-13 ENCOUNTER — Ambulatory Visit: Payer: Medicare Other | Admitting: Internal Medicine

## 2022-12-15 ENCOUNTER — Ambulatory Visit: Payer: Medicare Other | Admitting: Internal Medicine

## 2022-12-22 ENCOUNTER — Ambulatory Visit: Payer: Medicare Other | Admitting: Internal Medicine

## 2022-12-29 ENCOUNTER — Ambulatory Visit: Payer: Medicare Other | Admitting: Internal Medicine

## 2022-12-31 ENCOUNTER — Other Ambulatory Visit: Payer: Self-pay | Admitting: Cardiology

## 2022-12-31 ENCOUNTER — Other Ambulatory Visit: Payer: Self-pay | Admitting: Internal Medicine

## 2023-01-03 ENCOUNTER — Ambulatory Visit: Payer: Medicare Other | Admitting: Internal Medicine

## 2023-01-07 ENCOUNTER — Ambulatory Visit: Payer: Medicare Other | Admitting: Internal Medicine

## 2023-01-10 ENCOUNTER — Ambulatory Visit: Payer: Medicare Other | Admitting: Internal Medicine

## 2023-01-12 ENCOUNTER — Telehealth: Payer: Self-pay | Admitting: Cardiology

## 2023-01-12 NOTE — Telephone Encounter (Signed)
New Message:     Patient wants t o know if she can have a phone visit omorrow instead of an in person visit please?

## 2023-01-13 ENCOUNTER — Ambulatory Visit: Payer: Medicare Other | Attending: Cardiology | Admitting: Cardiology

## 2023-01-27 ENCOUNTER — Other Ambulatory Visit: Payer: Self-pay | Admitting: Cardiology

## 2023-01-31 ENCOUNTER — Other Ambulatory Visit: Payer: Self-pay | Admitting: Internal Medicine

## 2023-02-01 ENCOUNTER — Other Ambulatory Visit: Payer: Self-pay | Admitting: Internal Medicine

## 2023-02-04 ENCOUNTER — Other Ambulatory Visit: Payer: Self-pay

## 2023-02-04 ENCOUNTER — Encounter (HOSPITAL_COMMUNITY): Payer: Self-pay

## 2023-02-04 ENCOUNTER — Inpatient Hospital Stay (HOSPITAL_COMMUNITY)
Admission: AD | Admit: 2023-02-04 | Discharge: 2023-02-10 | DRG: 291 | Disposition: A | Discharge status: Rehabilitation Facility | Payer: Medicare Other | Attending: Internal Medicine | Admitting: Internal Medicine

## 2023-02-04 ENCOUNTER — Emergency Department (HOSPITAL_COMMUNITY): Payer: Medicare Other

## 2023-02-04 DIAGNOSIS — Z7982 Long term (current) use of aspirin: Secondary | ICD-10-CM

## 2023-02-04 DIAGNOSIS — E118 Type 2 diabetes mellitus with unspecified complications: Secondary | ICD-10-CM | POA: Diagnosis present

## 2023-02-04 DIAGNOSIS — M1990 Unspecified osteoarthritis, unspecified site: Secondary | ICD-10-CM | POA: Diagnosis present

## 2023-02-04 DIAGNOSIS — I4510 Unspecified right bundle-branch block: Secondary | ICD-10-CM | POA: Diagnosis present

## 2023-02-04 DIAGNOSIS — I255 Ischemic cardiomyopathy: Secondary | ICD-10-CM | POA: Diagnosis present

## 2023-02-04 DIAGNOSIS — E11649 Type 2 diabetes mellitus with hypoglycemia without coma: Secondary | ICD-10-CM | POA: Diagnosis present

## 2023-02-04 DIAGNOSIS — Z6831 Body mass index (BMI) 31.0-31.9, adult: Secondary | ICD-10-CM

## 2023-02-04 DIAGNOSIS — E119 Type 2 diabetes mellitus without complications: Secondary | ICD-10-CM | POA: Diagnosis not present

## 2023-02-04 DIAGNOSIS — I25119 Atherosclerotic heart disease of native coronary artery with unspecified angina pectoris: Secondary | ICD-10-CM | POA: Diagnosis present

## 2023-02-04 DIAGNOSIS — I2489 Other forms of acute ischemic heart disease: Secondary | ICD-10-CM | POA: Diagnosis present

## 2023-02-04 DIAGNOSIS — I5043 Acute on chronic combined systolic (congestive) and diastolic (congestive) heart failure: Secondary | ICD-10-CM | POA: Diagnosis not present

## 2023-02-04 DIAGNOSIS — Z7984 Long term (current) use of oral hypoglycemic drugs: Secondary | ICD-10-CM

## 2023-02-04 DIAGNOSIS — N1832 Chronic kidney disease, stage 3b: Secondary | ICD-10-CM | POA: Diagnosis present

## 2023-02-04 DIAGNOSIS — Z955 Presence of coronary angioplasty implant and graft: Secondary | ICD-10-CM | POA: Diagnosis not present

## 2023-02-04 DIAGNOSIS — R059 Cough, unspecified: Secondary | ICD-10-CM | POA: Diagnosis not present

## 2023-02-04 DIAGNOSIS — F17210 Nicotine dependence, cigarettes, uncomplicated: Secondary | ICD-10-CM | POA: Diagnosis present

## 2023-02-04 DIAGNOSIS — J9621 Acute and chronic respiratory failure with hypoxia: Secondary | ICD-10-CM | POA: Diagnosis present

## 2023-02-04 DIAGNOSIS — E781 Pure hyperglyceridemia: Secondary | ICD-10-CM | POA: Diagnosis present

## 2023-02-04 DIAGNOSIS — R079 Chest pain, unspecified: Secondary | ICD-10-CM | POA: Diagnosis not present

## 2023-02-04 DIAGNOSIS — Z1152 Encounter for screening for COVID-19: Secondary | ICD-10-CM | POA: Diagnosis not present

## 2023-02-04 DIAGNOSIS — K921 Melena: Secondary | ICD-10-CM | POA: Diagnosis not present

## 2023-02-04 DIAGNOSIS — E162 Hypoglycemia, unspecified: Secondary | ICD-10-CM | POA: Diagnosis not present

## 2023-02-04 DIAGNOSIS — Z66 Do not resuscitate: Secondary | ICD-10-CM | POA: Diagnosis present

## 2023-02-04 DIAGNOSIS — D649 Anemia, unspecified: Secondary | ICD-10-CM | POA: Diagnosis not present

## 2023-02-04 DIAGNOSIS — E1169 Type 2 diabetes mellitus with other specified complication: Secondary | ICD-10-CM | POA: Diagnosis present

## 2023-02-04 DIAGNOSIS — J441 Chronic obstructive pulmonary disease with (acute) exacerbation: Secondary | ICD-10-CM | POA: Diagnosis not present

## 2023-02-04 DIAGNOSIS — E785 Hyperlipidemia, unspecified: Secondary | ICD-10-CM | POA: Diagnosis present

## 2023-02-04 DIAGNOSIS — R7989 Other specified abnormal findings of blood chemistry: Secondary | ICD-10-CM | POA: Diagnosis not present

## 2023-02-04 DIAGNOSIS — I4430 Unspecified atrioventricular block: Secondary | ICD-10-CM | POA: Diagnosis not present

## 2023-02-04 DIAGNOSIS — J449 Chronic obstructive pulmonary disease, unspecified: Secondary | ICD-10-CM | POA: Diagnosis not present

## 2023-02-04 DIAGNOSIS — K5900 Constipation, unspecified: Secondary | ICD-10-CM | POA: Diagnosis present

## 2023-02-04 DIAGNOSIS — J9601 Acute respiratory failure with hypoxia: Secondary | ICD-10-CM

## 2023-02-04 DIAGNOSIS — R5381 Other malaise: Secondary | ICD-10-CM | POA: Diagnosis not present

## 2023-02-04 DIAGNOSIS — N189 Chronic kidney disease, unspecified: Secondary | ICD-10-CM

## 2023-02-04 DIAGNOSIS — I080 Rheumatic disorders of both mitral and aortic valves: Secondary | ICD-10-CM | POA: Diagnosis present

## 2023-02-04 DIAGNOSIS — I5042 Chronic combined systolic (congestive) and diastolic (congestive) heart failure: Secondary | ICD-10-CM | POA: Diagnosis present

## 2023-02-04 DIAGNOSIS — Z515 Encounter for palliative care: Secondary | ICD-10-CM | POA: Diagnosis not present

## 2023-02-04 DIAGNOSIS — I340 Nonrheumatic mitral (valve) insufficiency: Secondary | ICD-10-CM | POA: Diagnosis present

## 2023-02-04 DIAGNOSIS — F17200 Nicotine dependence, unspecified, uncomplicated: Secondary | ICD-10-CM | POA: Diagnosis present

## 2023-02-04 DIAGNOSIS — E1122 Type 2 diabetes mellitus with diabetic chronic kidney disease: Secondary | ICD-10-CM | POA: Diagnosis present

## 2023-02-04 DIAGNOSIS — I5023 Acute on chronic systolic (congestive) heart failure: Secondary | ICD-10-CM | POA: Diagnosis not present

## 2023-02-04 DIAGNOSIS — I252 Old myocardial infarction: Secondary | ICD-10-CM

## 2023-02-04 DIAGNOSIS — R339 Retention of urine, unspecified: Secondary | ICD-10-CM | POA: Diagnosis not present

## 2023-02-04 DIAGNOSIS — I959 Hypotension, unspecified: Secondary | ICD-10-CM | POA: Diagnosis not present

## 2023-02-04 DIAGNOSIS — R0602 Shortness of breath: Secondary | ICD-10-CM | POA: Diagnosis not present

## 2023-02-04 DIAGNOSIS — Z79899 Other long term (current) drug therapy: Secondary | ICD-10-CM

## 2023-02-04 DIAGNOSIS — I5022 Chronic systolic (congestive) heart failure: Secondary | ICD-10-CM

## 2023-02-04 DIAGNOSIS — I5020 Unspecified systolic (congestive) heart failure: Secondary | ICD-10-CM | POA: Diagnosis not present

## 2023-02-04 DIAGNOSIS — F54 Psychological and behavioral factors associated with disorders or diseases classified elsewhere: Secondary | ICD-10-CM | POA: Diagnosis not present

## 2023-02-04 DIAGNOSIS — I452 Bifascicular block: Secondary | ICD-10-CM | POA: Diagnosis present

## 2023-02-04 DIAGNOSIS — I509 Heart failure, unspecified: Secondary | ICD-10-CM

## 2023-02-04 DIAGNOSIS — N179 Acute kidney failure, unspecified: Secondary | ICD-10-CM

## 2023-02-04 DIAGNOSIS — Z952 Presence of prosthetic heart valve: Secondary | ICD-10-CM | POA: Diagnosis not present

## 2023-02-04 DIAGNOSIS — Z8673 Personal history of transient ischemic attack (TIA), and cerebral infarction without residual deficits: Secondary | ICD-10-CM

## 2023-02-04 DIAGNOSIS — I5021 Acute systolic (congestive) heart failure: Secondary | ICD-10-CM | POA: Diagnosis not present

## 2023-02-04 DIAGNOSIS — Z794 Long term (current) use of insulin: Secondary | ICD-10-CM | POA: Diagnosis not present

## 2023-02-04 DIAGNOSIS — Z888 Allergy status to other drugs, medicaments and biological substances status: Secondary | ICD-10-CM

## 2023-02-04 DIAGNOSIS — E871 Hypo-osmolality and hyponatremia: Secondary | ICD-10-CM | POA: Diagnosis not present

## 2023-02-04 DIAGNOSIS — I2582 Chronic total occlusion of coronary artery: Secondary | ICD-10-CM | POA: Diagnosis present

## 2023-02-04 DIAGNOSIS — R069 Unspecified abnormalities of breathing: Secondary | ICD-10-CM | POA: Diagnosis not present

## 2023-02-04 DIAGNOSIS — Z90711 Acquired absence of uterus with remaining cervical stump: Secondary | ICD-10-CM

## 2023-02-04 DIAGNOSIS — Z8249 Family history of ischemic heart disease and other diseases of the circulatory system: Secondary | ICD-10-CM

## 2023-02-04 DIAGNOSIS — I2510 Atherosclerotic heart disease of native coronary artery without angina pectoris: Secondary | ICD-10-CM | POA: Diagnosis not present

## 2023-02-04 DIAGNOSIS — E161 Other hypoglycemia: Secondary | ICD-10-CM | POA: Diagnosis not present

## 2023-02-04 DIAGNOSIS — R0682 Tachypnea, not elsewhere classified: Secondary | ICD-10-CM | POA: Diagnosis not present

## 2023-02-04 DIAGNOSIS — R627 Adult failure to thrive: Secondary | ICD-10-CM | POA: Diagnosis present

## 2023-02-04 DIAGNOSIS — D631 Anemia in chronic kidney disease: Secondary | ICD-10-CM | POA: Diagnosis present

## 2023-02-04 DIAGNOSIS — K5901 Slow transit constipation: Secondary | ICD-10-CM | POA: Diagnosis not present

## 2023-02-04 DIAGNOSIS — I130 Hypertensive heart and chronic kidney disease with heart failure and stage 1 through stage 4 chronic kidney disease, or unspecified chronic kidney disease: Principal | ICD-10-CM | POA: Diagnosis present

## 2023-02-04 DIAGNOSIS — E875 Hyperkalemia: Secondary | ICD-10-CM | POA: Diagnosis not present

## 2023-02-04 DIAGNOSIS — Z7189 Other specified counseling: Secondary | ICD-10-CM | POA: Diagnosis not present

## 2023-02-04 DIAGNOSIS — R110 Nausea: Secondary | ICD-10-CM | POA: Diagnosis not present

## 2023-02-04 DIAGNOSIS — Z9049 Acquired absence of other specified parts of digestive tract: Secondary | ICD-10-CM

## 2023-02-04 HISTORY — DX: Hypo-osmolality and hyponatremia: E87.1

## 2023-02-04 HISTORY — DX: Other specified abnormal findings of blood chemistry: R79.89

## 2023-02-04 HISTORY — DX: Acute respiratory failure with hypoxia: J96.01

## 2023-02-04 LAB — BASIC METABOLIC PANEL
Anion gap: 16 — ABNORMAL HIGH (ref 5–15)
Anion gap: 16 — ABNORMAL HIGH (ref 5–15)
BUN: 48 mg/dL — ABNORMAL HIGH (ref 8–23)
BUN: 50 mg/dL — ABNORMAL HIGH (ref 8–23)
CO2: 21 mmol/L — ABNORMAL LOW (ref 22–32)
CO2: 22 mmol/L (ref 22–32)
Calcium: 9.1 mg/dL (ref 8.9–10.3)
Calcium: 9.4 mg/dL (ref 8.9–10.3)
Chloride: 88 mmol/L — ABNORMAL LOW (ref 98–111)
Chloride: 88 mmol/L — ABNORMAL LOW (ref 98–111)
Creatinine, Ser: 1.67 mg/dL — ABNORMAL HIGH (ref 0.44–1.00)
Creatinine, Ser: 1.84 mg/dL — ABNORMAL HIGH (ref 0.44–1.00)
GFR, Estimated: 31 mL/min — ABNORMAL LOW (ref >60)
GFR, Estimated: 27 mL/min — ABNORMAL LOW (ref >60)
Glucose, Bld: 107 mg/dL — ABNORMAL HIGH (ref 70–99)
Glucose, Bld: 158 mg/dL — ABNORMAL HIGH (ref 70–99)
Potassium: 4.9 mmol/L (ref 3.5–5.1)
Potassium: 5.1 mmol/L (ref 3.5–5.1)
Sodium: 125 mmol/L — ABNORMAL LOW (ref 135–145)
Sodium: 126 mmol/L — ABNORMAL LOW (ref 135–145)

## 2023-02-04 LAB — RESP PANEL BY RT-PCR (RSV, FLU A&B, COVID)  RVPGX2
Influenza A by PCR: NEGATIVE
Influenza B by PCR: NEGATIVE
Resp Syncytial Virus by PCR: NEGATIVE
SARS Coronavirus 2 by RT PCR: NEGATIVE

## 2023-02-04 LAB — CBC WITH DIFFERENTIAL/PLATELET
Abs Immature Granulocytes: 0.03 10*3/uL (ref 0.00–0.07)
Basophils Absolute: 0 10*3/uL (ref 0.0–0.1)
Basophils Relative: 0 %
Eosinophils Absolute: 0 10*3/uL (ref 0.0–0.5)
Eosinophils Relative: 0 %
HCT: 40 % (ref 36.0–46.0)
Hemoglobin: 13.2 g/dL (ref 12.0–15.0)
Immature Granulocytes: 0 %
Lymphocytes Relative: 9 %
Lymphs Abs: 0.8 10*3/uL (ref 0.7–4.0)
MCH: 31 pg (ref 26.0–34.0)
MCHC: 33 g/dL (ref 30.0–36.0)
MCV: 93.9 fL (ref 80.0–100.0)
Monocytes Absolute: 0.7 10*3/uL (ref 0.1–1.0)
Monocytes Relative: 8 %
Neutro Abs: 7.3 10*3/uL (ref 1.7–7.7)
Neutrophils Relative %: 83 %
Platelets: 340 10*3/uL (ref 150–400)
RBC: 4.26 MIL/uL (ref 3.87–5.11)
RDW: 14.6 % (ref 11.5–15.5)
WBC: 8.9 10*3/uL (ref 4.0–10.5)
nRBC: 0 % (ref 0.0–0.2)

## 2023-02-04 LAB — COMPREHENSIVE METABOLIC PANEL
ALT: 35 U/L (ref 0–44)
AST: 56 U/L — ABNORMAL HIGH (ref 15–41)
Albumin: 3.3 g/dL — ABNORMAL LOW (ref 3.5–5.0)
Alkaline Phosphatase: 33 U/L — ABNORMAL LOW (ref 38–126)
Anion gap: 13 (ref 5–15)
BUN: 45 mg/dL — ABNORMAL HIGH (ref 8–23)
CO2: 21 mmol/L — ABNORMAL LOW (ref 22–32)
Calcium: 9.2 mg/dL (ref 8.9–10.3)
Chloride: 88 mmol/L — ABNORMAL LOW (ref 98–111)
Creatinine, Ser: 1.89 mg/dL — ABNORMAL HIGH (ref 0.44–1.00)
GFR, Estimated: 27 mL/min — ABNORMAL LOW (ref >60)
Glucose, Bld: 43 mg/dL — CL (ref 70–99)
Potassium: 5 mmol/L (ref 3.5–5.1)
Sodium: 122 mmol/L — ABNORMAL LOW (ref 135–145)
Total Bilirubin: 0.8 mg/dL (ref 0.3–1.2)
Total Protein: 6.8 g/dL (ref 6.5–8.1)

## 2023-02-04 LAB — TROPONIN I (HIGH SENSITIVITY)
Troponin I (High Sensitivity): 147 ng/L (ref <18)
Troponin I (High Sensitivity): 136 ng/L (ref <18)

## 2023-02-04 LAB — MAGNESIUM: Magnesium: 2.3 mg/dL (ref 1.7–2.4)

## 2023-02-04 LAB — LACTIC ACID, PLASMA
Lactic Acid, Venous: 1.2 mmol/L (ref 0.5–1.9)
Lactic Acid, Venous: 1.5 mmol/L (ref 0.5–1.9)

## 2023-02-04 LAB — CULTURE, BLOOD (ROUTINE X 2)

## 2023-02-04 LAB — CBG MONITORING, ED
Glucose-Capillary: 49 mg/dL — ABNORMAL LOW (ref 70–99)
Glucose-Capillary: 148 mg/dL — ABNORMAL HIGH (ref 70–99)

## 2023-02-04 LAB — BRAIN NATRIURETIC PEPTIDE: B Natriuretic Peptide: 3951.4 pg/mL — ABNORMAL HIGH (ref 0.0–100.0)

## 2023-02-04 MED ORDER — ACETAMINOPHEN 650 MG RE SUPP: 650.0000 mg | Freq: Four times a day (QID) | RECTAL | Status: DC | PRN

## 2023-02-04 MED ORDER — SODIUM CHLORIDE 0.9 % IV SOLN
1.0000 g | Freq: Once | INTRAVENOUS | Status: DC
  Filled 2023-02-04: qty 10

## 2023-02-04 MED ORDER — ALBUTEROL SULFATE HFA 108 (90 BASE) MCG/ACT IN AERS: 2.0000 | INHALATION_SPRAY | Freq: Four times a day (QID) | RESPIRATORY_TRACT | Status: DC | PRN

## 2023-02-04 MED ORDER — SODIUM CHLORIDE 0.9 % IV SOLN
500.0000 mg | Freq: Once | INTRAVENOUS | Status: AC
  Administered 2023-02-04: 500 mg via INTRAVENOUS
  Filled 2023-02-04: qty 5

## 2023-02-04 MED ORDER — ISOSORBIDE MONONITRATE ER 60 MG PO TB24
60.0000 mg | ORAL_TABLET | Freq: Every day | ORAL | Status: DC
  Administered 2023-02-05 – 2023-02-10 (×6): 60 mg via ORAL
  Filled 2023-02-04 (×6): qty 1

## 2023-02-04 MED ORDER — FUROSEMIDE 10 MG/ML IJ SOLN
40.0000 mg | Freq: Two times a day (BID) | INTRAMUSCULAR | Status: DC
  Administered 2023-02-04 – 2023-02-05 (×2): 40 mg via INTRAVENOUS
  Filled 2023-02-04 (×2): qty 4

## 2023-02-04 MED ORDER — POLYETHYLENE GLYCOL 3350 17 G PO PACK
17.0000 g | PACK | Freq: Every day | ORAL | Status: DC | PRN
  Administered 2023-02-07 – 2023-02-09 (×2): 17 g via ORAL
  Filled 2023-02-04 (×2): qty 1

## 2023-02-04 MED ORDER — ENOXAPARIN SODIUM 30 MG/0.3ML IJ SOSY
30.0000 mg | PREFILLED_SYRINGE | INTRAMUSCULAR | Status: DC
  Administered 2023-02-04 – 2023-02-06 (×3): 30 mg via SUBCUTANEOUS
  Filled 2023-02-04 (×3): qty 0.3

## 2023-02-04 MED ORDER — SODIUM CHLORIDE 0.9 % IV SOLN
2.0000 g | Freq: Once | INTRAVENOUS | Status: AC
  Administered 2023-02-04: 2 g via INTRAVENOUS

## 2023-02-04 MED ORDER — MAGNESIUM SULFATE 2 GM/50ML IV SOLN
2.0000 g | INTRAVENOUS | Status: AC
  Administered 2023-02-04: 2 g via INTRAVENOUS
  Filled 2023-02-04: qty 50

## 2023-02-04 MED ORDER — FUROSEMIDE 10 MG/ML IJ SOLN
40.0000 mg | INTRAMUSCULAR | Status: AC
  Administered 2023-02-04: 40 mg via INTRAVENOUS
  Filled 2023-02-04: qty 4

## 2023-02-04 MED ORDER — LACTATED RINGERS IV SOLN: INTRAVENOUS | Status: DC

## 2023-02-04 MED ORDER — METOPROLOL SUCCINATE ER 25 MG PO TB24: 25.0000 mg | ORAL_TABLET | Freq: Every day | ORAL | Status: DC

## 2023-02-04 MED ORDER — ACETAMINOPHEN 325 MG PO TABS: 650.0000 mg | ORAL_TABLET | Freq: Four times a day (QID) | ORAL | Status: DC | PRN

## 2023-02-04 MED ORDER — ATORVASTATIN CALCIUM 80 MG PO TABS
80.0000 mg | ORAL_TABLET | Freq: Every day | ORAL | Status: DC
  Administered 2023-02-05 – 2023-02-10 (×6): 80 mg via ORAL
  Filled 2023-02-04 (×6): qty 1

## 2023-02-04 MED ORDER — ALBUTEROL SULFATE (2.5 MG/3ML) 0.083% IN NEBU
2.5000 mg | INHALATION_SOLUTION | Freq: Four times a day (QID) | RESPIRATORY_TRACT | Status: DC | PRN
  Administered 2023-02-05 – 2023-02-07 (×3): 2.5 mg via RESPIRATORY_TRACT
  Filled 2023-02-04 (×3): qty 3

## 2023-02-04 MED ORDER — ALBUTEROL SULFATE (2.5 MG/3ML) 0.083% IN NEBU
10.0000 mg/h | INHALATION_SOLUTION | RESPIRATORY_TRACT | Status: AC
  Administered 2023-02-04: 10 mg/h via RESPIRATORY_TRACT
  Filled 2023-02-04 (×3): qty 12

## 2023-02-04 MED ORDER — METHYLPREDNISOLONE SODIUM SUCC 125 MG IJ SOLR
125.0000 mg | Freq: Once | INTRAMUSCULAR | Status: AC
  Administered 2023-02-04: 125 mg via INTRAVENOUS
  Filled 2023-02-04: qty 2

## 2023-02-04 MED ORDER — ASPIRIN 81 MG PO TBEC
81.0000 mg | DELAYED_RELEASE_TABLET | Freq: Every day | ORAL | Status: DC
  Administered 2023-02-05 – 2023-02-10 (×6): 81 mg via ORAL
  Filled 2023-02-04 (×6): qty 1

## 2023-02-04 MED ORDER — TIOTROPIUM BROMIDE MONOHYDRATE 18 MCG IN CAPS: 18.0000 ug | ORAL_CAPSULE | Freq: Every day | RESPIRATORY_TRACT | Status: DC

## 2023-02-04 MED ORDER — ALBUTEROL SULFATE (2.5 MG/3ML) 0.083% IN NEBU
10.0000 mg/h | INHALATION_SOLUTION | RESPIRATORY_TRACT | Status: AC
  Administered 2023-02-04: 10 mg/h via RESPIRATORY_TRACT
  Filled 2023-02-04: qty 12

## 2023-02-04 MED ORDER — UMECLIDINIUM BROMIDE 62.5 MCG/ACT IN AEPB
1.0000 | INHALATION_SPRAY | Freq: Every day | RESPIRATORY_TRACT | Status: DC
  Administered 2023-02-05 – 2023-02-09 (×5): 1 via RESPIRATORY_TRACT
  Filled 2023-02-04: qty 7

## 2023-02-04 NOTE — ED Triage Notes (Signed)
Here for shortness of breath since yesterday. 83% on RA per EMS. Not on home O2. Hx of COPD and emphysema. Smokes 1 1/2 packs daily. CBG was 56. Oral glucose given by EMS. Alert and oriented x 4. 84% on RA upon arrival. 2 duoneb tx given en route.

## 2023-02-04 NOTE — Consult Note (Addendum)
Cardiology Consultation   Patient ID: Angela Lucero MRN: [id number]; DOB: 1940/11/21  Admit date: 02/04/2023 Date of Consult: 02/04/2023  PCP:  [name], MD   [facility] Providers Cardiologist:  Gypsy Balsam, MD        Patient Profile:   Angela Lucero is a 82 y.o. female with a hx of CAD (prior PTCA and PCI in 1999, 2005), HFrEF, COPD, HLD, DM type II who is being seen 02/04/2023 for the evaluation of dyspnea, acute congestive heart failure at the request of Dr. Hyacinth Meeker.  History of Present Illness:   [name] is a 82 year old female with history as detailed above who presented to the ER with progressive SOB, orthopnea and chest discomfort. Specifically, she initially noted worsening SOB with activity and when laying flat about 2 days ago. Has chronic LE edema but this is overall unchanged. Last night, she had an episode of severe squeezing chest tightness that abated but then recurred this morning prompting her to come to the ER. This morning, just before activating EMS, severity was 7/10. She denies palpitations, dizziness/lightheadedness. Patient says that prior to the past 2-3 days, she had had no symptoms, was tolerating baseline exertion (walking around her trailer with assistance of walker). She continues to smoke 1 1/2 packs of cigarettes per day. Denies heavy salt diet or other dietary indiscretion. She has been compliant with all cardiovascular medications.  In the ER, trop 147, BNP 3951. Na 122. Cr 1.89. ECG with NSR with RBBB. CXR with bibasilar airspace opacities. She was notably hypoxic on admission and was placed on HFNC with improvement. She was given lasix 40mg  IV. Currently, she is chest pain free and breathing is improved.  Past Medical History:  Diagnosis Date   Arthritis 2010   Lower back and neck   CAD (coronary artery disease)    COPD (chronic obstructive pulmonary disease) (HCC)    CVA (cerebral vascular accident) (HCC)    1997, 2005 stents   Diabetes (HCC)     Diabetes mellitus, type II (HCC)    Gout 1998   Heart attack (HCC)    4098,1191   Hyperlipidemia    Hypertension    Hypertriglyceridemia     Past Surgical History:  Procedure Laterality Date   APPENDECTOMY  1971   CHOLECYSTECTOMY     LEFT HEART CATH AND CORONARY ANGIOGRAPHY N/A 12/14/2021   Procedure: LEFT HEART CATH AND CORONARY ANGIOGRAPHY;  Surgeon: Yvonne Kendall, MD;  Location: [facility] INVASIVE [facility];  Service: Cardiovascular;  Laterality: N/A;   Navel hernia repair  2007   PARTIAL HYSTERECTOMY  1976   Stent Heart     TUBAL LIGATION  1974   Ulna nerve transpost L arm       Home Medications:  Prior to Admission medications   Medication Sig Start Date End Date Taking? Authorizing Provider  acetaminophen (TYLENOL) 500 MG tablet Take 500 mg by mouth every 6 (six) hours as needed for mild pain.   Yes [provider]  albuterol (VENTOLIN HFA) 108 (90 Base) MCG/ACT inhaler Inhale 2 puffs into the lungs every 6 (six) hours as needed for wheezing or shortness of breath.   Yes [provider]  amLODipine (NORVASC) 5 MG tablet Take 1 tablet (5 mg total) by mouth daily. 02/24/22  Yes Georgeanna Lea, MD  aspirin EC 81 MG tablet Take 81 mg by mouth daily. Swallow whole.   Yes [provider]  atorvastatin (LIPITOR) 80 MG tablet Take 80 mg  by mouth daily.   Yes [provider]  Cetirizine-Pseudoephedrine (ALLERGY RELIEF D PO) Take 1 tablet by mouth daily.   Yes [provider]  Cholecalciferol (VITAMIN D3) 250 MCG (10000 UT) TABS Take 10,000 Units by mouth in the morning and at bedtime.   Yes [provider]  empagliflozin (JARDIANCE) 25 MG TABS tablet Take 25 mg by mouth daily.   Yes [provider]  fenofibrate 160 MG tablet Take 160 mg by mouth daily.   Yes [provider]  furosemide (LASIX) 40 MG tablet TAKE 1.5 TABLETS BY MOUTH DAILY. Patient taking differently: Take 60 mg by mouth daily. 01/27/23  Yes  Georgeanna Lea, MD  gabapentin (NEURONTIN) 300 MG capsule Take 300 mg by mouth 2 (two) times daily.   Yes [provider]  glimepiride (AMARYL) 2 MG tablet TAKE 1 TABLET BY MOUTH EVERY DAY 12/31/22  Yes Crist Fat, MD  isosorbide mononitrate (IMDUR) 60 MG 24 hr tablet Take 1 tablet (60 mg total) by mouth daily. 02/24/22  Yes Georgeanna Lea, MD  meloxicam (MOBIC) 7.5 MG tablet TAKE 1 TABLET BY MOUTH EVERY DAY AS NEEDED 01/31/23  Yes [name], MD  methocarbamol (ROBAXIN) 750 MG tablet TAKE 1 TABLET BY MOUTH THREE TIMES A DAY AS NEEDED 10/04/22  Yes [name], MD  metoprolol succinate (TOPROL-XL) 25 MG 24 hr tablet Take 1 tablet (25 mg total) by mouth daily. 12/18/21  Yes Jonita Albee, PA-C  Multiple Vitamin (MULTIVITAMIN ADULT PO) Take 1 tablet by mouth daily.   Yes [provider]  Multiple Vitamins-Minerals (PRESERVISION AREDS 2) CAPS Take 1 tablet by mouth daily.   Yes [provider]  Omega 3 1000 MG CAPS Take 2 capsules by mouth in the morning and at bedtime.   Yes [provider]  sacubitril-valsartan (ENTRESTO) 24-26 MG Take 1 tablet by mouth 2 (two) times daily. 01/05/22  Yes Georgeanna Lea, MD  tiotropium (SPIRIVA) 18 MCG inhalation capsule Place 18 mcg into inhaler and inhale daily.   Yes [provider]  traMADol (ULTRAM) 50 MG tablet TAKE 1 TABLET (50 MG TOTAL) BY MOUTH EVERY 6 (SIX) HOURS AS NEEDED FOR MODERATE PAIN 11/23/22   [name], MD    Inpatient Medications: Scheduled Meds:  Continuous Infusions:  lactated ringers Stopped (02/04/23 1450)   magnesium sulfate bolus IVPB 2 g (02/04/23 1448)   PRN Meds:   Allergies:   No Known Allergies  Social History:   Social History   Socioeconomic History   Marital status: Widowed    Spouse name: Not on file   Number of children: Not on file   Years of education: Not on file   Highest education level: Not on file  Occupational History   Not on file  Tobacco Use    Smoking status: Every Day    Packs/day: 1.5    Types: Cigarettes   Smokeless tobacco: Never  Vaping Use   Vaping Use: Never used  Substance and Sexual Activity   Alcohol use: Not Currently   Drug use: Never   Sexual activity: Not Currently  Other Topics Concern   Not on file  Social History Narrative   Not on file   Social Determinants of Health   Financial Resource Strain: Not on file  Food Insecurity: Not on file  Transportation Needs: Not on file  Physical Activity: Not on file  Stress: Not on file  Social Connections: Not on file  Intimate Partner Violence: Not  on file    Family History:    Family History  Problem Relation Age of Onset   Heart disease Mother    Hypertension Mother    Heart disease Father    Hypertension Father    Cancer Brother      ROS:  Please see the history of present illness.   All other ROS reviewed and negative.     Physical Exam/Data:   Vitals:   02/04/23 1400 02/04/23 1418 02/04/23 1445 02/04/23 1449  BP: 110/89  114/81   Pulse: 95 97 94   Resp: (!) 34 (!) 22 (!) 24   Temp:    98 F (36.7 C)  TempSrc:    Oral  SpO2: (!) 83% 91% 95%   Weight:      Height:        Intake/Output Summary (Last 24 hours) at 02/04/2023 1521 Last data filed at 02/04/2023 1439 Gross per 24 hour  Intake 350 ml  Output --  Net 350 ml      02/04/2023   11:50 AM 02/24/2022   10:46 AM 01/01/2022    1:01 PM  Last 3 Weights  Weight (lbs) 182 lb 1.6 oz 182 lb 3.2 oz 176 lb  Weight (kg) 82.6 kg 82.645 kg 79.833 kg     Body mass index is 29.39 kg/m.  General:  Well developed, no acute distress HEENT: normal Neck: JVP midway to mandible Vascular: No carotid bruits; Distal pulses 2+ bilaterally Cardiac:  normal S1, S2; RRR; no murmur  Lungs: Generally diminished. Bibasilar crackles. Middle and upper lobe end-expiratory wheezing.  Abd: soft, nontender, no hepatomegaly  Ext: Bilateral lower extremity edema, 2+ to mid shin.  Musculoskeletal:  No  deformities, BUE and BLE strength normal and equal Skin: warm and dry  Neuro:  CNs 2-12 intact, no focal abnormalities noted Psych:  Normal affect   EKG:  The EKG was personally reviewed and demonstrates:  sinus rhythm with right bundle branch block Telemetry:  Telemetry was personally reviewed and demonstrates:  sinus rhythm with right bundle branch block.  Relevant CV Studies:  12/16/21 MR Cardiac  CLINICAL DATA:  18F with CAD, T2DM p/w chest pain. Echo showed EF 30-35%,?RA mass. Cath showed CTO mid LAD, patent RCA stent.   EXAM: CARDIAC MRI   TECHNIQUE: The patient was scanned on a 1.5 Tesla Siemens magnet. A dedicated cardiac coil was used. Functional imaging was done using Fiesta sequences. 2,3, and 4 chamber views were done to assess for RWMA's. Modified Simpson's rule using a short axis stack was used to calculate an ejection fraction on a dedicated work [employer]. The patient received 10 cc of Gadavist. After 10 minutes inversion recovery sequences were used to assess for infiltration and scar tissue.   CONTRAST:  10 cc  of Gadavist   FINDINGS: Left ventricle:   -Severe dilatation   -Severe systolic dysfunction   -Subendocardial LGE consistent with prior infarct in basal anteroseptum, mid anterior/anteroseptum, apical anterior/septum, and apex. LGE >50% transmural suggesting nonviability in this territory   LV EF:  24% (Normal 56-78%)   Absolute volumes:   LV EDV: (Normal 52-141 mL)   LV ESV: (Normal 13-51 mL)   LV SV: 80mL (Normal 33-97 mL)   CO: 6.3L/min (Normal 2.7-6.0 L/min)   Indexed volumes:   LV EDV: 159mL/sq-m (Normal 41-81 mL/sq-m)   LV ESV: 164mL/sq-m (Normal 12-21 mL/sq-m)   LV SV: 28mL/sq-m (Normal 26-56 mL/sq-m)   CI: 3.3L/min/sq-m (Normal 1.8-3.8 L/min/sq-m)  Right ventricle: Normal size and systolic function   RV EF: 52% (Normal 47-80%)   Absolute volumes:   RV EDV: (Normal 58-154 mL)    RV ESV: 74mL (Normal 12-68 mL)   RV SV: 79mL (Normal 35-98 mL)   CO: 6.2L/min (Normal 2.7-6 L/min)   Indexed volumes:   RV EDV: 67mL/sq-m (Normal 48-87 mL/sq-m)   RV ESV: 32mL/sq-m (Normal 11-28 mL/sq-m)   RV SV: 43mL/sq-m (Normal 27-57 mL/sq-m)   CI: 3.2L/min/sq-m (Normal 1.8-3.8 L/min/sq-m)   Left atrium: Moderate enlargement   Right atrium: Mild enlargement.  No mass seen   Mitral valve: Moderate regurgitation (regurgitant fraction 26%)   Aortic valve: Tricuspid.  No regurgitation   Tricuspid valve: Mild regurgitation   Pulmonic valve: No regurgitation   Aorta: Normal proximal ascending aorta   Coronary arteries: Normal origins   Pericardium: Normal   Extracardiac structures: Opacity left base, recommend dedicated lung imaging   IMPRESSION: 1.  No right atrial mass seen   2. Subendocardial late gadolinium enhancement consistent with prior infarct in LAD territory (basal anteroseptum, mid anterior/anteroseptal, apical anterior/septal, and apex). LGE is greater than 50% transmural suggesting nonviability in this territory   3.  Severe LV dilatation with severe systolic dysfunction (EF 24%)   4.  Normal RV size and systolic function (EF 52%)   5.  Moderate mitral regurgitation (regurgitant fraction 26%)   6.  Opacity at left lung base, recommend dedicated lung imaging  12/14/21 LHC  Diagnostic Dominance: Right   12/15/2021 TTE  IMPRESSIONS     1. There is a fibrous structure that moves with tricuspid valve annulus.  This could represent a mass just the fibrous connection between the RA/RV.  Consider TEE for better characterization. Does not appear to be thrombus.  Cannot review images from  yesterday.   2. Left ventricular ejection fraction, by estimation, is 30 to 35%. The  left ventricle has moderately decreased function. The left ventricle  demonstrates regional wall motion abnormalities (see scoring  diagram/findings for description). The  left  ventricular internal cavity size was mildly dilated.   3. Right ventricular systolic function is normal. The right ventricular  size is normal.   4. The mitral valve is grossly normal. Mild to moderate mitral valve  regurgitation. No evidence of mitral stenosis.   5. The inferior vena cava is normal in size with greater than 50%  respiratory variability, suggesting right atrial pressure of 3 mmHg.   Conclusion(s)/Recommendation(s): Findings consistent with ischemic  cardiomyopathy. No left ventricular mural or apical thrombus/thrombi.   FINDINGS   Left Ventricle: Left ventricular ejection fraction, by estimation, is 30  to 35%. The left ventricle has moderately decreased function. The left  ventricle demonstrates regional wall motion abnormalities. Definity  contrast agent was given IV to delineate  the left ventricular endocardial borders. The left ventricular internal  cavity size was mildly dilated. There is no left ventricular hypertrophy.     LV Wall Scoring:  The mid and distal anterior septum, inferior septum, entire inferior wall,  apical anterior segment, and apex are hypokinetic.   Right Ventricle: The right ventricular size is normal. No increase in  right ventricular wall thickness. Right ventricular systolic function is  normal.   Left Atrium: Left atrial size was normal in size.   Right Atrium: There is a fibrous structure that moves with tricuspid valve  annulus. This could represent a mass just the fibrous connection between  the RA/RV. Consider TEE for better characterization. Does not appear  to be  thrombus. Cannot review images  from yesterday. Right atrial size was normal in size.   Mitral Valve: The mitral valve is grossly normal. Mild to moderate mitral  valve regurgitation. No evidence of mitral valve stenosis.   Tricuspid Valve: The tricuspid valve is grossly normal. Tricuspid valve  regurgitation is trivial. No evidence of tricuspid stenosis.    Aorta: The aortic root and ascending aorta are structurally normal, with  no evidence of dilitation.   Venous: The inferior vena cava is normal in size with greater than 50%  respiratory variability, suggesting right atrial pressure of 3 mmHg.   IAS/Shunts: The atrial septum is grossly normal.    Laboratory Data:  High Sensitivity Troponin:   Recent Labs  Lab 02/04/23 1152  TROPONINIHS 147*     Chemistry Recent Labs  Lab 02/04/23 1152  NA 122*  K 5.0  CL 88*  CO2 21*  GLUCOSE 43*  BUN 45*  CREATININE 1.89*  CALCIUM 9.2  MG 2.3  GFRNONAA 27*  ANIONGAP 13    Recent Labs  Lab 02/04/23 1152  PROT 6.8  ALBUMIN 3.3*  AST 56*  ALT 35  ALKPHOS 33*  BILITOT 0.8   Lipids No results for input(s): "CHOL", "TRIG", "HDL", "LABVLDL", "LDLCALC", "CHOLHDL" in the last 168 hours.  Hematology Recent Labs  Lab 02/04/23 1152  WBC 8.9  RBC 4.26  HGB 13.2  HCT 40.0  MCV 93.9  MCH 31.0  MCHC 33.0  RDW 14.6  PLT 340   Thyroid No results for input(s): "TSH", "FREET4" in the last 168 hours.  BNP Recent Labs  Lab 02/04/23 1152  BNP 3,951.4*    DDimer No results for input(s): "DDIMER" in the last 168 hours.   Radiology/Studies:  DG Chest Port 1 View  Result Date: 02/04/2023 CLINICAL DATA:  coughm, sob, copd, chf EXAM: PORTABLE CHEST 1 VIEW COMPARISON:  CXR 12/13/21 FINDINGS: The heart size and mediastinal contours are within normal limits. Aortic atherosclerotic calcifications. No pleural effusion. No pneumothorax. Hazy bibasilar airspace opacities could represent atelectasis or infection. The visualized skeletal structures are unremarkable. IMPRESSION: Hazy bibasilar airspace opacities could represent atelectasis or infection. Electronically Signed   By: Lorenza Cambridge M.D.   On: 02/04/2023 12:15     Assessment and Plan:  Ariena Mossholder is a 82 y.o. female with a hx of CAD (prior PTCA and PCI in 1999, 2005), HFrEF, COPD, HLD, DM type II who is being seen 02/04/2023 for the  evaluation of dyspnea, acute congestive heart failure at the request of Dr. Hyacinth Meeker.   Acute hypoxic respiratory failure Acute on chronic HFrEF  Patient with 2 days of worsening exertional tolerance secondary to dyspnea and orthopnea as well as sensation of chest tightness last night and this morning. CXR with bibasilar airspace opacities. BNP 3951.4.    Patient with bibasilar crackles on physical exam. Continue IV diuresis, improved after receiving 40mg  this afternoon. Plan for 40mg  IV BID. Repeat TTE GDMT including Jardiance 25mg , Toprol XL 25mg , Entresto 24-26mg  currently holding due to AKI. Will add as able pending continued improvement in renal function.   CAD w/ prior PCI Hyperlipidemia  Last year prior to elective knee surgery, patient was seen for clearance with plan to complete a stress test. Before this could be done, patient presented to [facility] with chest pain. Troponin was positive and patient was transferred to [facility] for [facility]. This study found CTO mid LAD and MRI that showed scarring in LAD. Given this finding, no  intervention planned.   ECG this admission non-ischemic with baseline RBBB. Troponin 147->136. This suggests type II MI/demand ischemia rather than ACS.  Check TTE. Would not plan on ischemic evaluation unless patient noted to have significant change in LVEF. Continue Toprol XL 25mg , Imdur 60mg , ASA 81, Atorvastatin 80mg .  Per primary team:  COPD DM type II  Risk Assessment/Risk Scores:        New York Heart Association (NYHA) Functional Class NYHA Class III        For questions or updates, please contact [facility] Please consult [email or web address] for contact info under    Signed, Perlie Gold, PA-C  02/04/2023 3:21 PM  Patient seen and examined and agree with Perlie Gold, PA-C as detailed above.  In brief, the patient is a 82 year old female with history of CAD with remote PCI in 1999 and 2005, chronic systolic HF with last  EF 24% by CMR, COPD with continued tobacco abuse, HLD and DMII who presented to the ER with progressive dyspnea, chest pain and orthopnea found to have acute on chronic  systolic HF exacerbation and elevated trop for which Cardiology was consulted.  Patient with known history of systolic HF with last EF 11/2021 24% by CMR with prior infarct in basal anteroseptum, mid anterior/anteroseptum, apical anterior/septum and apex with transmural LGE suggesting non-viability. Last LHC in 11/2021 with CTO of mid-LAD with R>L and L>L collaterals, patent distal RCA stent with mild in stent restenosis. She was managed medically at that time.    She presents on this admission with several day history of progressive SOB and orthopnea. Yesterday evening, she had an episode of severe substernal chest tightness that initially resolved but then recurred prompting her to go to the ER. In the ER, trop 147, BNP 3951. Na 122. Cr 1.89. ECG with NSR, RBBB, LVH. She was initially hypoxic with SpO2 80%. She was placed on HFNC and given lasix 40mg  IV with improvement.   Currently, the patient is more comfortable on exam. Suspect presentation most likely due to acute on chronic heart failure with lower suspicion for ACS. Trop relatively flat 147>136. Will continue diuresis and add back GDMT as tolerated.  Unfortunately, given transmural LGE on CMR, likely no benefit from LAD CTO intervention due to lack of viability.  GEN: Sitting upright in bed, HFNC in place. Neck: JVD to angle of the mandible Cardiac: RRR, no murmurs, rubs, or gallops.  Respiratory: Bibaslilar crackles with overall diminished breath sounds and end expiratory wheezing GI: Soft, nontender, non-distended  MS: 1+ LE edema to mid shin. Warm. Neuro:  Nonfocal  Psych: Normal affect    Plan: -Suspect primary driver of symptoms is acute heart failure with lower suspicion of ACS with trop relatively flat -Continue lasix 40mg  IV BID and monitor response -Holding  SGLT2i, spiro and ARNI for now given AKI on CKD; will tomorrow as able -No BB until volume status improves -Continue imdur 60mg  daily -Continue ASA 81mg  daily, lipitor 80mg  daily -Management of COPD per primary team  Laurance Flatten, MD

## 2023-02-04 NOTE — H&P (Cosign Needed Addendum)
Date: 02/04/2023               Patient Name:  Angela Lucero MRN: [id number]  DOB: Mar 04, 1941 Age / Sex: 82 y.o., female   PCP: [name], MD         Medical Service: Internal Medicine Teaching Service         Attending Physician: Dr. Mercie Eon, MD      First Contact: Dr. Modena Slater, DO Pager [phone]    Second Contact: Dr. Elza Rafter, DO Pager [phone]         After Hours (After 5p/  First Contact Pager: [phone]  weekends / holidays): Second Contact Pager: [phone]   SUBJECTIVE   Chief Complaint: shortness of breath  History of Present Illness: Angela Lucero is a 82 yo female with COPD not on home O2, tobacco use disorder, hypertension, type 2 diabetes, and HFrEF (EF 30-35% in April 2023) who presents with a few days of shortness of breath.  Patient states that over the past few days she has had difficulty breathing.  She states that yesterday she was resting, and developed some chest pain that lasted a few seconds.  She states during that episode she felt a sharp type pain that was crushing in nature.  She became diaphoretic and the pain radiated to both of her arms.  She states the pain worsened with exertion.  She states it has been intermittent since.  She reported that she did not want to call EMS because she was too stubborn yesterday.  Patient was found to be hypoxic in the 80s.  She notes that she has trouble breathing with laying flat, which has been worsened over the past few days.  She does endorse a chronic cough which has not worsened.  She does report having some lower extremity edema, but not worse than usual.  She reports that over the past few days she has not had a good appetite.  She has some diffuse abdominal pain but denies any nausea, vomiting, diarrhea.  She reports that she has been constipated, and she had a minor bowel movement this morning.  She reports she started taking Senokot over the past 2 days.  The only thing she has eaten today was a few crackers.  She denies  any fevers or chills.  She denies any sick contacts.  ED course: Patient initially presented to the emergency department with concerns of shortness of breath.  Patient was found to be hypoxic at the mid 80s.  Patient was on nonrebreather, and transitioned to high flow nasal cannula.  Patient is afebrile with respiratory rate elevated in the high 20s.  Blood pressure was 126/72.  Initial labs significant for BNP 3951.4, troponin 147, sodium 122, chloride 88, glucose 43, creatinine 1.89, lactate 1.2.  Patient had blood cultures drawn.  Chest x-ray showing hazy bibasilar airspace opacities.  With concern for possible heart failure or infection, as well as increasing oxygen requirement, patient was admitted for acute hypoxic respiratory failure.  Patient was given dose of Lasix and IMTS contacted for further evaluation management.  Meds:  Plavix? Albuterol Amlodipine 5 mg Aspirin 81 mg Atorvastatin 80 mg Jardiance 25 mg Fenofibrate 160 mg Lasix 60 mg daily Gabapentin 300 mg BID Glimiperide 2 mg daily Imdur 30 mg daily Meloxicam 7.5 mg daily Robaxin 750 mg TID PRN Metoprolol succinate 25 mg daily Multivitamin  Omega 3 BID preserVision one tablet daily Entresto 24-26 mg BID Tiotropium daily  Tramdol 50 mg q6h PRN Vitamin D3  Current Meds  Medication Sig   acetaminophen (TYLENOL) 500 MG tablet Take 500 mg by mouth every 6 (six) hours as needed for mild pain.   albuterol (VENTOLIN HFA) 108 (90 Base) MCG/ACT inhaler Inhale 2 puffs into the lungs every 6 (six) hours as needed for wheezing or shortness of breath.   amLODipine (NORVASC) 5 MG tablet Take 1 tablet (5 mg total) by mouth daily.   aspirin EC 81 MG tablet Take 81 mg by mouth daily. Swallow whole.   atorvastatin (LIPITOR) 80 MG tablet Take 80 mg by mouth daily.   Cetirizine-Pseudoephedrine (ALLERGY RELIEF D PO) Take 1 tablet by mouth daily.   Cholecalciferol (VITAMIN D3) 250 MCG (10000 UT) TABS Take 10,000 Units by mouth in the  morning and at bedtime.   empagliflozin (JARDIANCE) 25 MG TABS tablet Take 25 mg by mouth daily.   fenofibrate 160 MG tablet Take 160 mg by mouth daily.   furosemide (LASIX) 40 MG tablet TAKE 1.5 TABLETS BY MOUTH DAILY. (Patient taking differently: Take 60 mg by mouth daily.)   gabapentin (NEURONTIN) 300 MG capsule Take 300 mg by mouth 2 (two) times daily.   glimepiride (AMARYL) 2 MG tablet TAKE 1 TABLET BY MOUTH EVERY DAY   isosorbide mononitrate (IMDUR) 60 MG 24 hr tablet Take 1 tablet (60 mg total) by mouth daily.   meloxicam (MOBIC) 7.5 MG tablet TAKE 1 TABLET BY MOUTH EVERY DAY AS NEEDED   methocarbamol (ROBAXIN) 750 MG tablet TAKE 1 TABLET BY MOUTH THREE TIMES A DAY AS NEEDED   metoprolol succinate (TOPROL-XL) 25 MG 24 hr tablet Take 1 tablet (25 mg total) by mouth daily.   Multiple Vitamin (MULTIVITAMIN ADULT PO) Take 1 tablet by mouth daily.   Multiple Vitamins-Minerals (PRESERVISION AREDS 2) CAPS Take 1 tablet by mouth daily.   Omega 3 1000 MG CAPS Take 2 capsules by mouth in the morning and at bedtime.   sacubitril-valsartan (ENTRESTO) 24-26 MG Take 1 tablet by mouth 2 (two) times daily.   tiotropium (SPIRIVA) 18 MCG inhalation capsule Place 18 mcg into inhaler and inhale daily.    Past Medical History STEMI in 2005  Past Surgical History:  Procedure Laterality Date   APPENDECTOMY  1971   CHOLECYSTECTOMY     LEFT HEART CATH AND CORONARY ANGIOGRAPHY N/A 12/14/2021   Procedure: LEFT HEART CATH AND CORONARY ANGIOGRAPHY;  Surgeon: Yvonne Kendall, MD;  Location: [facility];  Service: Cardiovascular;  Laterality: N/A;   Navel hernia repair  2007   PARTIAL HYSTERECTOMY  1976   Stent Heart     TUBAL LIGATION  1974   Ulna nerve transpost L arm      Social:  Lives alone Support: neighbor, son Level of Function: independent at baseline (not this last week) uses a walker now; independent in ADL, iADLs but has not driven in 1 year PCP: Dr. [name] () Substances:  Tobacco use (1.5 packs/day x 60 years); no alcohol or other substance use   Family History:  Myocardial infarction in both parents  Allergies: Allergies as of 02/04/2023   (No Known Allergies)    Review of Systems: A complete ROS was negative except as per HPI.   OBJECTIVE:   Physical Exam: Blood pressure 100/68, pulse 97, temperature 98.2 F (36.8 C), temperature source Oral, resp. rate (!) 22, height 5\' 6"  (1.676 m), weight 82.6 kg, SpO2 91 %.  Constitutional: Resting in bed, with high flow oxygen flowing HENT: normocephalic atraumatic Eyes: conjunctiva non-erythematous Cardiovascular: Regular  rate and rhythm, no murmurs, rubs, gallops appreciated Pulmonary/Chest: increased work of breathing on 15 L  Abdominal: Mid epigastric hernia appreciated, no tenderness, normoactive bowel sounds MSK: 1+ lower extremity edema Skin: warm and dry  Labs: CBC    Component Value Date/Time   WBC 8.9 02/04/2023 1152   RBC 4.26 02/04/2023 1152   HGB 13.2 02/04/2023 1152   HCT 40.0 02/04/2023 1152   PLT 340 02/04/2023 1152   MCV 93.9 02/04/2023 1152   MCH 31.0 02/04/2023 1152   MCHC 33.0 02/04/2023 1152   RDW 14.6 02/04/2023 1152   LYMPHSABS 0.8 02/04/2023 1152   MONOABS 0.7 02/04/2023 1152   EOSABS 0.0 02/04/2023 1152   BASOSABS 0.0 02/04/2023 1152     CMP     Component Value Date/Time   NA 122 (L) 02/04/2023 1152   NA 137 02/24/2022 1113   K 5.0 02/04/2023 1152   CL 88 (L) 02/04/2023 1152   CO2 21 (L) 02/04/2023 1152   GLUCOSE 43 (LL) 02/04/2023 1152   BUN 45 (H) 02/04/2023 1152   BUN 47 (H) 02/24/2022 1113   CREATININE 1.89 (H) 02/04/2023 1152   CALCIUM 9.2 02/04/2023 1152   PROT 6.8 02/04/2023 1152   ALBUMIN 3.3 (L) 02/04/2023 1152   AST 56 (H) 02/04/2023 1152   ALT 35 02/04/2023 1152   ALKPHOS 33 (L) 02/04/2023 1152   BILITOT 0.8 02/04/2023 1152   GFRNONAA 27 (L) 02/04/2023 1152    Imaging: CXR: Bibasilar airspace opacities concerning for infection vs  atelectasis  EKG: personally reviewed my interpretation is sinus rhythm with a rate of 93. Left axis deviation. Prolonged PR interval.  No acute ST segment changes appreciated. When compared to previous ECG no acute changes.  ASSESSMENT & PLAN:   Assessment & Plan by Problem: Principal Problem:   Acute hypoxic respiratory failure (HCC) Active Problems:   Type 2 diabetes mellitus with complication, without long-term current use of insulin (HCC)   Congestive heart failure (CHF) (HCC)   Smoking   Dyslipidemia   Hyponatremia   Elevated troponin  Angela Lucero is a 82 y.o. female with a past medical history of HFrEF, hypertension, type 2 diabetes who presents to the emergency department with concerns of shortness of breath.  Patient admitted for further evaluation and management of acute hypoxic respiratory failure.  #Acute hypoxic respiratory failure #Acute on chronic HFrEF (Ischemic cardiomyopathy) #CAD status post stenting Patient has a past medical history of ischemic cardiomyopathy with most recent echocardiogram on December 15, 2021 showing ejection fraction of 30 to 35%.  Patient has had stenting back in 1999 in 2005.  Most recent cath on 12/14/2021 showing moderate left circumflex and RCA disease.  With chronic total occlusion of mid LAD with right to left and left to left collaterals.  Patient presents with 2-day history of worsening shortness of breath.  This all started after episode of chest pain.  Patient presented initially with oxygen levels in the mid 80s.  Patient has been compliant with all of her medications.  Initial labs showed elevated troponins.  Patient also had bilateral basilar opacities.  Patient has not had any fevers concerning for infection.  On exam, patient did have crackles bilaterally.  Troponins were elevated, but did peak likely related to demand ischemia.  Will diurese patient and wean oxygen. Unclear etiology on what caused this event.  -Cardiology following,  appreciate recs -Start Lasix 40 mg IV twice daily -TTE pending -Hold home Jardiance 25 mg daily -Hold home metoprolol succinate  25 mg daily -Hold home Entresto 24-26 mg twice daily -Restart home aspirin 81 mg daily -Strict I's and O's -Daily weights -Restart home Imdur 60 mg daily -Restart home atorvastatin 80 mg daily  #Pre-Renal AKI #Hyponatremia Prerenal AKI in setting of heart failure.  Baseline creatinine around 1.3-1.4.  Creatinine elevated today at 1.89.  Patient initial sodium 122.  This is likely hypotonic hyponatremia in the setting of volume overload.  Will continue diurese patient and see how this helps.  Will hold fluids at this moment. -Trend BMPs every 6 hours -Continue to monitor volume status  #History of COPD I do not think patient is currently in a COPD exacerbation.  She denies any increased cough, sputum, or change in sputum.  Will continue with home inhalers -Continue home inhaler Spiriva not on formulary, will use Incruse  #Type 2 diabetes mellitus Patient has a past medical history of type 2 diabetes mellitus.  Home medication includes Jardiance 25 mg daily.  Patient's most recent A1c on 12/18/2022 was 6.8.  Will plan to repeat A1c. Patient was initially hypoglycemic when she presented. Will hold sliding scale insulin at this time.  -Will hold home Jardiance 25 mg daily -A1c pending  Diet: Heart Healthy VTE: Enoxaparin IVF: None,None Code: DNR/DNI  Prior to Admission Living Arrangement: Home, living alone Anticipated Discharge Location: Home Barriers to Discharge: Clinical Improvement   Dispo: Admit patient to Observation with expected length of stay less than 2 midnights.  Signed: Modena Slater, DO Internal Medicine Resident PGY-1  02/04/2023, 5:22 PM   Dr. Modena Slater, DO Pager [phone]

## 2023-02-04 NOTE — ED Provider Notes (Addendum)
[place] EMERGENCY DEPARTMENT AT [facility] Provider Note   CSN: [id number] Arrival date & time: 02/04/23  1143     History  Chief Complaint  Patient presents with   Shortness of Breath    Angela Lucero is a 82 y.o. female.   Shortness of Breath  This patient is an 82 year old female with a known history of COPD and hypertension, she also has a history of heart disease on long-acting nitrates, she is on daily inhalers and follows with the family doctor in [place].  She states that she has no pulmonologist, she does get some seasonal allergies and feels that over the last couple of days she has had significant difficulty breathing which is gradually worsening.  She was found to be 80% on room air when the paramedics found her, they placed her on oxygen and gave her nebulizer treatments times multiple on the way to the hospital.  The patient states that this has helped a little but she still feels very short of breath and very weak.  She denies chest pain, fevers, no increase in the swelling of her legs.  The medical record reports that the patient last saw her cardiologist in June 2023.  She had a history of coronary angioplasty and stenting that was done in 1999 and then 2005.  Ejection fraction of 30 to 35%, history of diabetes and dyslipidemia as well as chronic tobacco use and COPD.  The patient is not oxygen dependent at home    Home Medications Prior to Admission medications   Medication Sig Start Date End Date Taking? Authorizing Provider  acetaminophen (TYLENOL) 500 MG tablet Take 500 mg by mouth every 6 (six) hours as needed for mild pain.    [provider]  albuterol (VENTOLIN HFA) 108 (90 Base) MCG/ACT inhaler Inhale 2 puffs into the lungs every 6 (six) hours as needed for wheezing or shortness of breath.    [provider]  amLODipine (NORVASC) 5 MG tablet Take 1 tablet (5 mg total) by mouth daily. 02/24/22   Georgeanna Lea, MD  aspirin EC 81  MG tablet Take 81 mg by mouth daily. Swallow whole.    [provider]  atorvastatin (LIPITOR) 80 MG tablet Take 80 mg by mouth daily.    [provider]  Cetirizine-Pseudoephedrine (ALLERGY RELIEF D PO) Take 1 tablet by mouth daily.    [provider]  Cholecalciferol (VITAMIN D3) 250 MCG (10000 UT) TABS Take 10,000 Units by mouth in the morning and at bedtime.    [provider]  empagliflozin (JARDIANCE) 25 MG TABS tablet Take 25 mg by mouth daily.    [provider]  fenofibrate 160 MG tablet Take 160 mg by mouth daily.    [provider]  furosemide (LASIX) 40 MG tablet TAKE 1.5 TABLETS BY MOUTH DAILY. 01/27/23   Georgeanna Lea, MD  gabapentin (NEURONTIN) 300 MG capsule Take 300 mg by mouth 2 (two) times daily.    [provider]  glimepiride (AMARYL) 2 MG tablet TAKE 1 TABLET BY MOUTH EVERY DAY 12/31/22   Crist Fat, MD  isosorbide mononitrate (IMDUR) 60 MG 24 hr tablet Take 1 tablet (60 mg total) by mouth daily. 02/24/22   Georgeanna Lea, MD  meloxicam (MOBIC) 7.5 MG tablet TAKE 1 TABLET BY MOUTH EVERY DAY AS NEEDED 01/31/23   [name], MD  methocarbamol (ROBAXIN) 750 MG tablet TAKE 1 TABLET BY MOUTH THREE TIMES A DAY AS NEEDED 10/04/22  [name], MD  metoprolol succinate (TOPROL-XL) 25 MG 24 hr tablet Take 1 tablet (25 mg total) by mouth daily. 12/18/21   Jonita Albee, PA-C  Multiple Vitamin (MULTIVITAMIN ADULT PO) Take 1 tablet by mouth daily.    [provider]  Multiple Vitamins-Minerals (PRESERVISION AREDS 2) CAPS Take 1 tablet by mouth daily.    [provider]  Omega 3 1000 MG CAPS Take 2 capsules by mouth in the morning and at bedtime.    [provider]  sacubitril-valsartan (ENTRESTO) 24-26 MG Take 1 tablet by mouth 2 (two) times daily. 01/05/22   Georgeanna Lea, MD  tiotropium (SPIRIVA) 18 MCG inhalation capsule Place 18 mcg into inhaler and inhale daily.    [provider]  traMADol (ULTRAM) 50 MG tablet TAKE 1 TABLET (50 MG TOTAL) BY MOUTH EVERY 6 (SIX) HOURS AS NEEDED FOR MODERATE PAIN 11/23/22   [name], MD      Allergies    Patient has no known allergies.    Review of Systems   Review of Systems  Respiratory:  Positive for shortness of breath.   All other systems reviewed and are negative.   Physical Exam Updated Vital Signs BP 100/68   Pulse 100   Temp 98.2 F (36.8 C) (Oral)   Resp (!) 26   Ht 1.676 m (5\' 6" )   Wt 82.6 kg   SpO2 (!) 88%   BMI 29.39 kg/m  Physical Exam Vitals and nursing note reviewed.  Constitutional:      General: She is not in acute distress.    Appearance: She is well-developed.  HENT:     Head: Normocephalic and atraumatic.     Nose: No congestion or rhinorrhea.     Mouth/Throat:     Mouth: Mucous membranes are moist.     Pharynx: No oropharyngeal exudate.  Eyes:     General: No scleral icterus.       Right eye: No discharge.        Left eye: No discharge.     Conjunctiva/sclera: Conjunctivae normal.     Pupils: Pupils are equal, round, and reactive to light.  Neck:     Thyroid: No thyromegaly.     Vascular: No JVD.  Cardiovascular:     Rate and Rhythm: Regular rhythm. Tachycardia present.     Heart sounds: Normal heart sounds. No murmur heard.    No friction rub. No gallop.  Pulmonary:     Effort: Respiratory distress present.     Breath sounds: Wheezing and rhonchi present. No rales.  Abdominal:     General: Bowel sounds are normal. There is no distension.     Palpations: Abdomen is soft. There is no mass.     Tenderness: There is no abdominal tenderness.  Musculoskeletal:        General: No tenderness. Normal range of motion.     Cervical back: Normal range of motion and neck supple.     Right lower leg: Edema present.     Left lower leg: Edema present.  Lymphadenopathy:     Cervical: No cervical adenopathy.  Skin:    General: Skin is warm and dry.     Findings: No erythema  or rash.  Neurological:     Mental Status: She is alert.     Coordination: Coordination normal.  Psychiatric:        Behavior: Behavior normal.     ED Results / Procedures / Treatments   Labs (  all labs ordered are listed, but only abnormal results are displayed) Labs Reviewed  COMPREHENSIVE METABOLIC PANEL - Abnormal; Notable for the following components:      Result Value   Sodium 122 (*)    Chloride 88 (*)    CO2 21 (*)    Glucose, Bld 43 (*)    BUN 45 (*)    Creatinine, Ser 1.89 (*)    Albumin 3.3 (*)    AST 56 (*)    Alkaline Phosphatase 33 (*)    GFR, Estimated 27 (*)    All other components within normal limits  CBG MONITORING, ED - Abnormal; Notable for the following components:   Glucose-Capillary 49 (*)    All other components within normal limits  TROPONIN I (HIGH SENSITIVITY) - Abnormal; Notable for the following components:   Troponin I (High Sensitivity) 147 (*)    All other components within normal limits  RESP PANEL BY RT-PCR (RSV, FLU A&B, COVID)  RVPGX2  CULTURE, BLOOD (ROUTINE X 2)  CULTURE, BLOOD (ROUTINE X 2)  CBC WITH DIFFERENTIAL/PLATELET  MAGNESIUM  LACTIC ACID, PLASMA  BRAIN NATRIURETIC PEPTIDE  LACTIC ACID, PLASMA  CBG MONITORING, ED  TROPONIN I (HIGH SENSITIVITY)    EKG EKG Interpretation  Date/Time:  Friday February 04 2023 11:52:39 EDT Ventricular Rate:  93 PR Interval:  213 QRS Duration: 180 QT Interval:  392 QTC Calculation: 488 R Axis:   -75 Text Interpretation: Sinus rhythm Borderline prolonged PR interval Probable left atrial enlargement Right bundle branch block LVH with secondary repolarization abnormality Probable anterior infarct, age indeterminate Since last tracing rate faster Confirmed by Eber Hong ([id number]) on 02/04/2023 12:22:50 PM  Radiology DG Chest Port 1 View  Result Date: 02/04/2023 CLINICAL DATA:  coughm, sob, copd, chf EXAM: PORTABLE CHEST 1 VIEW COMPARISON:  CXR 12/13/21 FINDINGS: The heart size and mediastinal  contours are within normal limits. Aortic atherosclerotic calcifications. No pleural effusion. No pneumothorax. Hazy bibasilar airspace opacities could represent atelectasis or infection. The visualized skeletal structures are unremarkable. IMPRESSION: Hazy bibasilar airspace opacities could represent atelectasis or infection. Electronically Signed   By: Lorenza Cambridge M.D.   On: 02/04/2023 12:15    Procedures .Critical Care  Performed by: Eber Hong, MD Authorized by: Eber Hong, MD   Critical care provider statement:    Critical care time (minutes):  45   Critical care time was exclusive of:  Separately billable procedures and treating other patients   Critical care was necessary to treat or prevent imminent or life-threatening deterioration of the following conditions:  Respiratory failure and cardiac failure   Critical care was time spent personally by me on the following activities:  Development of treatment plan with patient or surrogate, discussions with consultants, evaluation of patient's response to treatment, examination of patient, obtaining history from patient or surrogate, review of old charts, re-evaluation of patient's condition, pulse oximetry, ordering and review of radiographic studies, ordering and review of laboratory studies and ordering and performing treatments and interventions   I assumed direction of critical care for this patient from another provider in my specialty: no     Care discussed with: admitting provider   Comments:           Medications Ordered in ED Medications  albuterol (PROVENTIL) (2.5 MG/3ML) 0.083% nebulizer solution (0 mg/hr Nebulization Stopped 02/04/23 1318)  lactated ringers infusion (has no administration in time range)  magnesium sulfate IVPB 2 g 50 mL (has no administration in time range)  albuterol (PROVENTIL,VENTOLIN) solution  continuous neb (has no administration in time range)  furosemide (LASIX) injection 40 mg (has no  administration in time range)  methylPREDNISolone sodium succinate (SOLU-MEDROL) 125 mg/2 mL injection 125 mg (125 mg Intravenous Given 02/04/23 1214)  azithromycin (ZITHROMAX) 500 mg in sodium chloride 0.9 % 250 mL IVPB (500 mg Intravenous New Bag/Given 02/04/23 1248)  cefTRIAXone (ROCEPHIN) 2 g in sodium chloride 0.9 % 100 mL IVPB (0 g Intravenous Stopped 02/04/23 1318)    ED Course/ Medical Decision Making/ A&P                             Medical Decision Making Amount and/or Complexity of Data Reviewed Labs: ordered. Radiology: ordered.  Risk Prescription drug management. Decision regarding hospitalization.    This patient presents to the ED for concern of shortness of breath, this involves an extensive number of treatment options, and is a complaint that carries with it a high risk of complications and morbidity.  The differential diagnosis includes OPD, congestive heart failure, pneumonia, pneumothorax   Co morbidities that complicate the patient evaluation  Coronary disease, COPD, ongoing heavy smoking at 1 and half packs per day   Additional history obtained:  Additional history obtained from patient and the paramedics as well as the medical record External records from outside source obtained and reviewed including cardiac MRI echocardiogram and heart catheterization performed about 14 months ago Heart catheterization showed severe single-vessel coronary disease with total occlusion of the mid LAD with some collaterals present.  Moderate left circumflex and right coronary disease.  Patent stent in the RCA Echocardiogram revealed that the patient had an ejection fraction of 30 to 35%, Cardiac MRI showed no right atrial mass, prior infarct, severe left ventricular dilation with an ejection fraction of 24%, normal RV size, moderate mitral regurgitation   Lab Tests:  I Ordered, and personally interpreted labs.  The pertinent results include: Slightly worsening renal  insufficiency, creatinine is close to 1.9.  Severe hyponatremia at 122, troponin elevated at 149   Imaging Studies ordered:  I ordered imaging studies including chest x-ray I independently visualized and interpreted imaging which showed bibasilar infiltrates I agree with the radiologist interpretation   Cardiac Monitoring: / EKG:  The patient was maintained on a cardiac monitor.  I personally viewed and interpreted the cardiac monitored which showed an underlying rhythm of: Sinus tachycardia, right bundle branch block   Consultations Obtained:  I requested consultation with the IM resident / hospitalist,  and discussed lab and imaging findings as well as pertinent plan - they recommend: Admission   Problem List / ED Course / Critical / Medication management  The patient is critically ill with multiple abnormal findings including fairly significant hyponatremia, minor acute kidney injury, acute on chronic renal failure.  Troponin is elevated at just over 140, the patient is not having chest pain, she is short of breath but lungs are very tight and wheezy, getting better with albuterol treatments.  Multiple continuous nebulizer treatments given for hypoxic respiratory failure I have reviewed the patients home medicines and have made adjustments as needed   Social Determinants of Health:  Continues to smoke heavily about a pack and half per day   Test / Admission - Considered:  Admit to higher level of care         Final Clinical Impression(s) / ED Diagnoses Final diagnoses:  Acute hypoxic respiratory failure (HCC)  COPD exacerbation (HCC)  Elevated troponin  Hyponatremia  AKI (acute kidney injury) (HCC)  Hypoglycemia     Eber Hong, MD 02/04/23 1408    Eber Hong, MD 02/04/23 [phone]

## 2023-02-04 NOTE — ED Notes (Signed)
Pt O2 was removed upon transfer from EMS stretcher to ED stretcher. Pt desat to 84% on RA. Placed on 6LPM [place] with O2 staying in the 80s. Non rebreather initiated at this time. O2 now is at 93% on the monitor. Will continue to monitor.

## 2023-02-04 NOTE — Sepsis Progress Note (Signed)
eLink is following this Code Sepsis. °

## 2023-02-04 NOTE — ED Notes (Signed)
ED TO INPATIENT HANDOFF REPORT  ED Nurse Name and Phone #: Sheilah Mins [phone]  S Name/Age/Gender Angela Lucero 82 y.o. female Room/Bed: TRAAC/TRAAC  Code Status   Code Status: DNR  Home/SNF/Other Home Patient oriented to: self, place, time, and situation Is this baseline? Yes   Triage Complete: Triage complete  Chief Complaint Acute hypoxic respiratory failure (HCC) [J96.01]  Triage Note Here for shortness of breath since yesterday. 83% on RA per EMS. Not on home O2. Hx of COPD and emphysema. Smokes 1 1/2 packs daily. CBG was 56. Oral glucose given by EMS. Alert and oriented x 4. 84% on RA upon arrival. 2 duoneb tx given en route.    Allergies No Known Allergies  Level of Care/Admitting Diagnosis ED Disposition     ED Disposition  Admit   Condition  --   Comment  Hospital Area: [facility] [[id number]]  Level of Care: Telemetry Medical [104]  May place patient in observation at [facility] or [facility] if equivalent level of care is available:: No  Covid Evaluation: Asymptomatic - no recent exposure (last 10 days) testing not required  Diagnosis: Acute hypoxic respiratory failure [facility]) [[id number]]  Admitting Physician: Mercie Eon [[id number]]  Attending Physician: Mercie Eon [[id number]]          B Medical/Surgery History Past Medical History:  Diagnosis Date   Arthritis 2010   Lower back and neck   CAD (coronary artery disease)    COPD (chronic obstructive pulmonary disease) (HCC)    CVA (cerebral vascular accident) (HCC)    1997, 2005 stents   Diabetes (HCC)    Diabetes mellitus, type II (HCC)    Gout 1998   Heart attack (HCC)    [id number]   Hyperlipidemia    Hypertension    Hypertriglyceridemia    Past Surgical History:  Procedure Laterality Date   APPENDECTOMY  1971   CHOLECYSTECTOMY     LEFT HEART CATH AND CORONARY ANGIOGRAPHY N/A 12/14/2021   Procedure: LEFT HEART CATH AND CORONARY ANGIOGRAPHY;  Surgeon: Yvonne Kendall, MD;   Location: [facility] INVASIVE CV LAB;  Service: Cardiovascular;  Laterality: N/A;   Navel hernia repair  2007   PARTIAL HYSTERECTOMY  1976   Stent Heart     TUBAL LIGATION  1974   Ulna nerve transpost L arm       A IV Location/Drains/Wounds Patient Lines/Drains/Airways Status     Active Line/Drains/Airways     Name Placement date Placement time Site Days   Peripheral IV 02/04/23 18 G Left Antecubital 02/04/23  1214  Antecubital  less than 1   Peripheral IV 02/04/23 20 G Right Antecubital 02/04/23  1249  Antecubital  less than 1            Intake/Output Last 24 hours  Intake/Output Summary (Last 24 hours) at 02/04/2023 1546 Last data filed at 02/04/2023 1439 Gross per 24 hour  Intake 350 ml  Output --  Net 350 ml    Labs/Imaging Results for orders placed or performed during the hospital encounter of 02/04/23 (from the past 48 hour(s))  Brain natriuretic peptide     Status: Abnormal   Collection Time: 02/04/23 11:52 AM  Result Value Ref Range   B Natriuretic Peptide 3,951.4 (H) 0.0 - 100.0 pg/mL    Comment: Performed at [facility], [address], [place], [place] [address]  Troponin I (High Sensitivity)     Status: Abnormal   Collection Time: 02/04/23 11:52 AM  Result Value Ref  Range   Troponin I (High Sensitivity) 147 (HH) <18 ng/L    Comment: ATTEMPTED TO CALL @1315  CRITICAL RESULT CALLED TO, READ BACK BY AND VERIFIED WITH KATRINA YONG RN@1338  06.07.2024 E.AHMED (NOTE) Elevated high sensitivity troponin I (hsTnI) values and significant  changes across serial measurements may suggest ACS but many other  chronic and acute conditions are known to elevate hsTnI results.  Refer to the Links section for chest pain algorithms and additional  guidance. Performed at [facility], [address] [place], [place] [address]   CBC with Differential     Status: None   Collection Time: 02/04/23 11:52 AM  Result Value Ref Range   WBC 8.9 4.0 - 10.5 K/uL   RBC 4.26 3.87  - 5.11 MIL/uL   Hemoglobin 13.2 12.0 - 15.0 g/dL   HCT 13.0 86.5 - 78.4 %   MCV 93.9 80.0 - 100.0 fL   MCH 31.0 26.0 - 34.0 pg   MCHC 33.0 30.0 - 36.0 g/dL   RDW 69.6 29.5 - 28.4 %   Platelets 340 150 - 400 K/uL   nRBC 0.0 0.0 - 0.2 %   Neutrophils Relative % 83 %   Neutro Abs 7.3 1.7 - 7.7 K/uL   Lymphocytes Relative 9 %   Lymphs Abs 0.8 0.7 - 4.0 K/uL   Monocytes Relative 8 %   Monocytes Absolute 0.7 0.1 - 1.0 K/uL   Eosinophils Relative 0 %   Eosinophils Absolute 0.0 0.0 - 0.5 K/uL   Basophils Relative 0 %   Basophils Absolute 0.0 0.0 - 0.1 K/uL   Immature Granulocytes 0 %   Abs Immature Granulocytes 0.03 0.00 - 0.07 K/uL    Comment: Performed at [facility], [address] [place], [place] [address]  Comprehensive metabolic panel     Status: Abnormal   Collection Time: 02/04/23 11:52 AM  Result Value Ref Range   Sodium 122 (L) 135 - 145 mmol/L   Potassium 5.0 3.5 - 5.1 mmol/L   Chloride 88 (L) 98 - 111 mmol/L   CO2 21 (L) 22 - 32 mmol/L   Glucose, Bld 43 (LL) 70 - 99 mg/dL    Comment: ATTEMPTED TO CALL @1315  CRITICAL RESULT CALLED TO, READ BACK BY AND VERIFIED WITH KATRINA YONG RN@1338  06.07.2024 E.AHMED Glucose reference range applies only to samples taken after fasting for at least 8 hours.    BUN 45 (H) 8 - 23 mg/dL   Creatinine, Ser 0.10 (H) 0.44 - 1.00 mg/dL   Calcium 9.2 8.9 - 27.2 mg/dL   Total Protein 6.8 6.5 - 8.1 g/dL   Albumin 3.3 (L) 3.5 - 5.0 g/dL   AST 56 (H) 15 - 41 U/L   ALT 35 0 - 44 U/L   Alkaline Phosphatase 33 (L) 38 - 126 U/L   Total Bilirubin 0.8 0.3 - 1.2 mg/dL   GFR, Estimated 27 (L) >60 mL/min    Comment: (NOTE) Calculated using the CKD-EPI Creatinine Equation (2021)    Anion gap 13 5 - 15    Comment: Performed at [facility], [address] [place], [place] [address]  Magnesium     Status: None   Collection Time: 02/04/23 11:52 AM  Result Value Ref Range   Magnesium 2.3 1.7 - 2.4 mg/dL    Comment: Performed at [facility], [address], [place], [place] [address]  CBG monitoring, ED     Status: Abnormal   Collection Time: 02/04/23  12:00 PM  Result Value Ref Range   Glucose-Capillary 49 (L) 70 - 99 mg/dL    Comment: Glucose reference range applies only to samples taken after fasting for at least 8 hours.  Resp panel by RT-PCR (RSV, Flu A&B, Covid)     Status: None   Collection Time: 02/04/23 12:25 PM   Specimen: Nasal Swab  Result Value Ref Range   SARS Coronavirus 2 by RT PCR NEGATIVE NEGATIVE   Influenza A by PCR NEGATIVE NEGATIVE   Influenza B by PCR NEGATIVE NEGATIVE    Comment: (NOTE) The Xpert Xpress SARS-CoV-2/FLU/RSV plus assay is intended as an aid in the diagnosis of influenza from Nasopharyngeal swab specimens and should not be used as a sole basis for treatment. Nasal washings and aspirates are unacceptable for Xpert Xpress SARS-CoV-2/FLU/RSV testing.  Fact Sheet for Patients: BloggerCourse.com  Fact Sheet for Healthcare Providers: SeriousBroker.it  This test is not yet approved or cleared by the [place] FDA and has been authorized for detection and/or diagnosis of SARS-CoV-2 by FDA under an Emergency Use Authorization (EUA). This EUA will remain in effect (meaning this test can be used) for the duration of the COVID-19 declaration under Section 564(b)(1) of the Act, 21 U.S.C. section 360bbb-3(b)(1), unless the authorization is terminated or revoked.     Resp Syncytial Virus by PCR NEGATIVE NEGATIVE    Comment: (NOTE) Fact Sheet for Patients: BloggerCourse.com  Fact Sheet for Healthcare Providers: SeriousBroker.it  This test is not yet approved or cleared by the [place] FDA and has been authorized for detection and/or diagnosis of SARS-CoV-2 by FDA under an Emergency Use Authorization (EUA). This EUA will remain in effect (meaning this test can be used)  for the duration of the COVID-19 declaration under Section 564(b)(1) of the Act, 21 U.S.C. section 360bbb-3(b)(1), unless the authorization is terminated or revoked.  Performed at [facility], [address], [place], [place] [address]   Blood Culture (routine x 2)     Status: None (Preliminary result)   Collection Time: 02/04/23 12:25 PM   Specimen: BLOOD  Result Value Ref Range   Specimen Description BLOOD LEFT ANTECUBITAL    Special Requests      BOTTLES DRAWN AEROBIC AND ANAEROBIC Blood Culture adequate volume   Culture      NO GROWTH <12 HOURS Performed at [facility], [address], [place], [place] [address]    Report Status PENDING   Lactic acid, plasma     Status: None   Collection Time: 02/04/23 12:29 PM  Result Value Ref Range   Lactic Acid, Venous 1.2 0.5 - 1.9 mmol/L    Comment: Performed at [facility], [address], [place], [place] [address]  Blood Culture (routine x 2)     Status: None (Preliminary result)   Collection Time: 02/04/23 12:30 PM   Specimen: BLOOD  Result Value Ref Range   Specimen Description BLOOD RIGHT ANTECUBITAL    Special Requests      BOTTLES DRAWN AEROBIC AND ANAEROBIC Blood Culture adequate volume   Culture      NO GROWTH <12 HOURS Performed at [facility], [address] [place], [place] [address]    Report Status PENDING   CBG monitoring, ED     Status: Abnormal   Collection Time: 02/04/23  2:37 PM  Result Value Ref Range   Glucose-Capillary 148 (H) 70 - 99 mg/dL    Comment: Glucose reference range applies only to  samples taken after fasting for at least 8 hours.   DG Chest Port 1 View  Result Date: 02/04/2023 CLINICAL DATA:  coughm, sob, copd, chf EXAM: PORTABLE CHEST 1 VIEW COMPARISON:  CXR 12/13/21 FINDINGS: The heart size and mediastinal contours are within normal limits. Aortic atherosclerotic calcifications. No pleural effusion. No pneumothorax. Hazy bibasilar airspace opacities could represent  atelectasis or infection. The visualized skeletal structures are unremarkable. IMPRESSION: Hazy bibasilar airspace opacities could represent atelectasis or infection. Electronically Signed   By: Lorenza Cambridge M.D.   On: 02/04/2023 12:15    Pending Labs Unresulted Labs (From admission, onward)     Start     Ordered   02/05/23 0500  Basic metabolic panel  Tomorrow morning,   R        02/04/23 1540   02/05/23 0500  CBC  Tomorrow morning,   R        02/04/23 1540   02/04/23 1600  Basic metabolic panel  Now then every 6 hours,   R (with TIMED occurrences)      02/04/23 1541   02/04/23 1225  Lactic acid, plasma  (Septic presentation on arrival (screening labs, nursing and treatment orders for obvious sepsis))  Now then every 2 hours,   R (with STAT occurrences)      02/04/23 1224            Vitals/Pain Today's Vitals   02/04/23 1418 02/04/23 1445 02/04/23 1449 02/04/23 1530  BP:  114/81  (!) 120/98  Pulse: 97 94  94  Resp: (!) 22 (!) 24  15  Temp:   98 F (36.7 C)   TempSrc:   Oral   SpO2: 91% 95%  95%  Weight:      Height:      PainSc:        Isolation Precautions No active isolations  Medications Medications  albuterol (PROVENTIL) (2.5 MG/3ML) 0.083% nebulizer solution (0 mg/hr Nebulization Stopped 02/04/23 1318)  magnesium sulfate IVPB 2 g 50 mL (2 g Intravenous New Bag/Given 02/04/23 1448)  albuterol (PROVENTIL) (2.5 MG/3ML) 0.083% nebulizer solution (0 mg/hr Nebulization Stopped 02/04/23 1532)  enoxaparin (LOVENOX) injection 30 mg (has no administration in time range)  acetaminophen (TYLENOL) tablet 650 mg (has no administration in time range)    Or  acetaminophen (TYLENOL) suppository 650 mg (has no administration in time range)  polyethylene glycol (MIRALAX / GLYCOLAX) packet 17 g (has no administration in time range)  methylPREDNISolone sodium succinate (SOLU-MEDROL) 125 mg/2 mL injection 125 mg (125 mg Intravenous Given 02/04/23 1214)  azithromycin (ZITHROMAX) 500 mg in  sodium chloride 0.9 % 250 mL IVPB (0 mg Intravenous Stopped 02/04/23 1439)  cefTRIAXone (ROCEPHIN) 2 g in sodium chloride 0.9 % 100 mL IVPB (0 g Intravenous Stopped 02/04/23 1318)  furosemide (LASIX) injection 40 mg (40 mg Intravenous Given 02/04/23 1448)    Mobility walks     Focused Assessments Pulmonary Assessment Handoff:  Lung sounds: Bilateral Breath Sounds: Diminished, Expiratory wheezes L Breath Sounds: Diminished, Expiratory wheezes R Breath Sounds: Diminished, Expiratory wheezes O2 Device: High Flow Nasal Cannula O2 Flow Rate (L/min): 40 L/min    R Recommendations: See Admitting Provider Note  Report given to:   Additional Notes:

## 2023-02-05 ENCOUNTER — Inpatient Hospital Stay (HOSPITAL_COMMUNITY): Payer: Medicare Other

## 2023-02-05 DIAGNOSIS — I080 Rheumatic disorders of both mitral and aortic valves: Secondary | ICD-10-CM | POA: Diagnosis present

## 2023-02-05 DIAGNOSIS — E11649 Type 2 diabetes mellitus with hypoglycemia without coma: Secondary | ICD-10-CM | POA: Diagnosis present

## 2023-02-05 DIAGNOSIS — Z6831 Body mass index (BMI) 31.0-31.9, adult: Secondary | ICD-10-CM | POA: Diagnosis not present

## 2023-02-05 DIAGNOSIS — J9621 Acute and chronic respiratory failure with hypoxia: Secondary | ICD-10-CM | POA: Diagnosis present

## 2023-02-05 DIAGNOSIS — E1122 Type 2 diabetes mellitus with diabetic chronic kidney disease: Secondary | ICD-10-CM | POA: Diagnosis present

## 2023-02-05 DIAGNOSIS — J441 Chronic obstructive pulmonary disease with (acute) exacerbation: Secondary | ICD-10-CM | POA: Diagnosis present

## 2023-02-05 DIAGNOSIS — I5022 Chronic systolic (congestive) heart failure: Secondary | ICD-10-CM

## 2023-02-05 DIAGNOSIS — Z955 Presence of coronary angioplasty implant and graft: Secondary | ICD-10-CM | POA: Diagnosis not present

## 2023-02-05 DIAGNOSIS — F17210 Nicotine dependence, cigarettes, uncomplicated: Secondary | ICD-10-CM

## 2023-02-05 DIAGNOSIS — I959 Hypotension, unspecified: Secondary | ICD-10-CM | POA: Diagnosis not present

## 2023-02-05 DIAGNOSIS — I5021 Acute systolic (congestive) heart failure: Secondary | ICD-10-CM | POA: Diagnosis not present

## 2023-02-05 DIAGNOSIS — R110 Nausea: Secondary | ICD-10-CM | POA: Diagnosis not present

## 2023-02-05 DIAGNOSIS — I255 Ischemic cardiomyopathy: Secondary | ICD-10-CM | POA: Diagnosis present

## 2023-02-05 DIAGNOSIS — E875 Hyperkalemia: Secondary | ICD-10-CM | POA: Diagnosis present

## 2023-02-05 DIAGNOSIS — I25119 Atherosclerotic heart disease of native coronary artery with unspecified angina pectoris: Secondary | ICD-10-CM | POA: Diagnosis present

## 2023-02-05 DIAGNOSIS — Z794 Long term (current) use of insulin: Secondary | ICD-10-CM

## 2023-02-05 DIAGNOSIS — N179 Acute kidney failure, unspecified: Secondary | ICD-10-CM

## 2023-02-05 DIAGNOSIS — J449 Chronic obstructive pulmonary disease, unspecified: Secondary | ICD-10-CM

## 2023-02-05 DIAGNOSIS — N1832 Chronic kidney disease, stage 3b: Secondary | ICD-10-CM | POA: Diagnosis present

## 2023-02-05 DIAGNOSIS — R079 Chest pain, unspecified: Secondary | ICD-10-CM | POA: Diagnosis not present

## 2023-02-05 DIAGNOSIS — I2489 Other forms of acute ischemic heart disease: Secondary | ICD-10-CM | POA: Diagnosis present

## 2023-02-05 DIAGNOSIS — F54 Psychological and behavioral factors associated with disorders or diseases classified elsewhere: Secondary | ICD-10-CM | POA: Diagnosis not present

## 2023-02-05 DIAGNOSIS — I2582 Chronic total occlusion of coronary artery: Secondary | ICD-10-CM | POA: Diagnosis present

## 2023-02-05 DIAGNOSIS — Z7189 Other specified counseling: Secondary | ICD-10-CM | POA: Diagnosis not present

## 2023-02-05 DIAGNOSIS — Z515 Encounter for palliative care: Secondary | ICD-10-CM | POA: Diagnosis not present

## 2023-02-05 DIAGNOSIS — E119 Type 2 diabetes mellitus without complications: Secondary | ICD-10-CM | POA: Diagnosis not present

## 2023-02-05 DIAGNOSIS — K5901 Slow transit constipation: Secondary | ICD-10-CM | POA: Diagnosis not present

## 2023-02-05 DIAGNOSIS — I5023 Acute on chronic systolic (congestive) heart failure: Secondary | ICD-10-CM

## 2023-02-05 DIAGNOSIS — R7989 Other specified abnormal findings of blood chemistry: Secondary | ICD-10-CM | POA: Diagnosis not present

## 2023-02-05 DIAGNOSIS — Z66 Do not resuscitate: Secondary | ICD-10-CM | POA: Diagnosis present

## 2023-02-05 DIAGNOSIS — K921 Melena: Secondary | ICD-10-CM | POA: Diagnosis not present

## 2023-02-05 DIAGNOSIS — E781 Pure hyperglyceridemia: Secondary | ICD-10-CM | POA: Diagnosis present

## 2023-02-05 DIAGNOSIS — R5381 Other malaise: Secondary | ICD-10-CM | POA: Diagnosis present

## 2023-02-05 DIAGNOSIS — M1990 Unspecified osteoarthritis, unspecified site: Secondary | ICD-10-CM | POA: Diagnosis present

## 2023-02-05 DIAGNOSIS — R0682 Tachypnea, not elsewhere classified: Secondary | ICD-10-CM | POA: Diagnosis not present

## 2023-02-05 DIAGNOSIS — K5900 Constipation, unspecified: Secondary | ICD-10-CM | POA: Diagnosis present

## 2023-02-05 DIAGNOSIS — D649 Anemia, unspecified: Secondary | ICD-10-CM | POA: Diagnosis not present

## 2023-02-05 DIAGNOSIS — I130 Hypertensive heart and chronic kidney disease with heart failure and stage 1 through stage 4 chronic kidney disease, or unspecified chronic kidney disease: Secondary | ICD-10-CM | POA: Diagnosis present

## 2023-02-05 DIAGNOSIS — I2510 Atherosclerotic heart disease of native coronary artery without angina pectoris: Secondary | ICD-10-CM | POA: Diagnosis present

## 2023-02-05 DIAGNOSIS — D631 Anemia in chronic kidney disease: Secondary | ICD-10-CM | POA: Diagnosis not present

## 2023-02-05 DIAGNOSIS — Z7984 Long term (current) use of oral hypoglycemic drugs: Secondary | ICD-10-CM | POA: Diagnosis not present

## 2023-02-05 DIAGNOSIS — Z1152 Encounter for screening for COVID-19: Secondary | ICD-10-CM | POA: Diagnosis not present

## 2023-02-05 DIAGNOSIS — R627 Adult failure to thrive: Secondary | ICD-10-CM | POA: Diagnosis present

## 2023-02-05 DIAGNOSIS — I4510 Unspecified right bundle-branch block: Secondary | ICD-10-CM | POA: Diagnosis present

## 2023-02-05 DIAGNOSIS — R339 Retention of urine, unspecified: Secondary | ICD-10-CM | POA: Diagnosis not present

## 2023-02-05 DIAGNOSIS — I5020 Unspecified systolic (congestive) heart failure: Secondary | ICD-10-CM | POA: Diagnosis not present

## 2023-02-05 DIAGNOSIS — I340 Nonrheumatic mitral (valve) insufficiency: Secondary | ICD-10-CM | POA: Diagnosis present

## 2023-02-05 DIAGNOSIS — I452 Bifascicular block: Secondary | ICD-10-CM | POA: Diagnosis present

## 2023-02-05 DIAGNOSIS — E871 Hypo-osmolality and hyponatremia: Secondary | ICD-10-CM | POA: Diagnosis present

## 2023-02-05 DIAGNOSIS — J9601 Acute respiratory failure with hypoxia: Secondary | ICD-10-CM | POA: Diagnosis present

## 2023-02-05 DIAGNOSIS — I5042 Chronic combined systolic (congestive) and diastolic (congestive) heart failure: Secondary | ICD-10-CM | POA: Diagnosis present

## 2023-02-05 DIAGNOSIS — Z952 Presence of prosthetic heart valve: Secondary | ICD-10-CM | POA: Diagnosis not present

## 2023-02-05 HISTORY — DX: Acute and chronic respiratory failure with hypoxia: J96.21

## 2023-02-05 HISTORY — DX: Chronic obstructive pulmonary disease with (acute) exacerbation: J44.1

## 2023-02-05 HISTORY — DX: Acute on chronic systolic (congestive) heart failure: I50.23

## 2023-02-05 LAB — BASIC METABOLIC PANEL
Anion gap: 15 (ref 5–15)
Anion gap: 15 (ref 5–15)
Anion gap: 16 — ABNORMAL HIGH (ref 5–15)
BUN: 53 mg/dL — ABNORMAL HIGH (ref 8–23)
BUN: 53 mg/dL — ABNORMAL HIGH (ref 8–23)
BUN: 56 mg/dL — ABNORMAL HIGH (ref 8–23)
CO2: 21 mmol/L — ABNORMAL LOW (ref 22–32)
CO2: 25 mmol/L (ref 22–32)
CO2: 22 mmol/L (ref 22–32)
Calcium: 8.7 mg/dL — ABNORMAL LOW (ref 8.9–10.3)
Calcium: 8.8 mg/dL — ABNORMAL LOW (ref 8.9–10.3)
Calcium: 8.7 mg/dL — ABNORMAL LOW (ref 8.9–10.3)
Chloride: 88 mmol/L — ABNORMAL LOW (ref 98–111)
Chloride: 88 mmol/L — ABNORMAL LOW (ref 98–111)
Chloride: 89 mmol/L — ABNORMAL LOW (ref 98–111)
Creatinine, Ser: 1.81 mg/dL — ABNORMAL HIGH (ref 0.44–1.00)
Creatinine, Ser: 1.76 mg/dL — ABNORMAL HIGH (ref 0.44–1.00)
Creatinine, Ser: 1.68 mg/dL — ABNORMAL HIGH (ref 0.44–1.00)
GFR, Estimated: 28 mL/min — ABNORMAL LOW (ref >60)
GFR, Estimated: 29 mL/min — ABNORMAL LOW (ref >60)
GFR, Estimated: 31 mL/min — ABNORMAL LOW (ref >60)
Glucose, Bld: 82 mg/dL (ref 70–99)
Glucose, Bld: 100 mg/dL — ABNORMAL HIGH (ref 70–99)
Glucose, Bld: 150 mg/dL — ABNORMAL HIGH (ref 70–99)
Potassium: 5.7 mmol/L — ABNORMAL HIGH (ref 3.5–5.1)
Potassium: 4.5 mmol/L (ref 3.5–5.1)
Potassium: 5.8 mmol/L — ABNORMAL HIGH (ref 3.5–5.1)
Sodium: 124 mmol/L — ABNORMAL LOW (ref 135–145)
Sodium: 128 mmol/L — ABNORMAL LOW (ref 135–145)
Sodium: 127 mmol/L — ABNORMAL LOW (ref 135–145)

## 2023-02-05 LAB — CBC
HCT: 37.7 % (ref 36.0–46.0)
Hemoglobin: 12.8 g/dL (ref 12.0–15.0)
MCH: 30.3 pg (ref 26.0–34.0)
MCHC: 34 g/dL (ref 30.0–36.0)
MCV: 89.3 fL (ref 80.0–100.0)
Platelets: 312 10*3/uL (ref 150–400)
RBC: 4.22 MIL/uL (ref 3.87–5.11)
RDW: 14.6 % (ref 11.5–15.5)
WBC: 11 10*3/uL — ABNORMAL HIGH (ref 4.0–10.5)
nRBC: 0 % (ref 0.0–0.2)

## 2023-02-05 LAB — ECHOCARDIOGRAM COMPLETE
AR max vel: 2.96 cm2
AV Area VTI: 3.09 cm2
AV Area mean vel: 2.61 cm2
AV Mean grad: 2 mmHg
AV Peak grad: 3.2 mmHg
Ao pk vel: 0.89 m/s
Area-P 1/2: 4.46 cm2
Calc EF: 29.5 %
Height: 66 in
MV M vel: 4.86 m/s
MV Peak grad: 94.5 mmHg
MV VTI: 1.53 cm2
Radius: 0.9 cm
S' Lateral: 5.1 cm
Single Plane A2C EF: 27.7 %
Single Plane A4C EF: 31.2 %
Weight: 2945.35 oz

## 2023-02-05 LAB — PROTIME-INR
INR: 1 (ref 0.8–1.2)
Prothrombin Time: 13.7 seconds (ref 11.4–15.2)

## 2023-02-05 LAB — POTASSIUM: Potassium: 4.5 mmol/L (ref 3.5–5.1)

## 2023-02-05 LAB — HEMOGLOBIN A1C
Hgb A1c MFr Bld: 6 % — ABNORMAL HIGH (ref 4.8–5.6)
Mean Plasma Glucose: 125.5 mg/dL

## 2023-02-05 LAB — CULTURE, BLOOD (ROUTINE X 2)

## 2023-02-05 MED ORDER — METOPROLOL SUCCINATE ER 25 MG PO TB24
25.0000 mg | ORAL_TABLET | Freq: Every day | ORAL | Status: DC
  Administered 2023-02-05 – 2023-02-10 (×6): 25 mg via ORAL
  Filled 2023-02-05 (×6): qty 1

## 2023-02-05 MED ORDER — ORAL CARE MOUTH RINSE: 15.0000 mL | OROMUCOSAL | Status: DC | PRN

## 2023-02-05 MED ORDER — FUROSEMIDE 10 MG/ML IJ SOLN
80.0000 mg | Freq: Two times a day (BID) | INTRAMUSCULAR | Status: DC
  Administered 2023-02-05 – 2023-02-06 (×3): 80 mg via INTRAVENOUS
  Filled 2023-02-05 (×3): qty 8

## 2023-02-05 MED ORDER — IPRATROPIUM-ALBUTEROL 0.5-2.5 (3) MG/3ML IN SOLN
3.0000 mL | Freq: Three times a day (TID) | RESPIRATORY_TRACT | Status: DC
  Administered 2023-02-05 – 2023-02-10 (×15): 3 mL via RESPIRATORY_TRACT
  Filled 2023-02-05 (×16): qty 3

## 2023-02-05 MED ORDER — BENZONATATE 100 MG PO CAPS
200.0000 mg | ORAL_CAPSULE | Freq: Once | ORAL | Status: AC
  Administered 2023-02-05: 200 mg via ORAL
  Filled 2023-02-05: qty 2

## 2023-02-05 MED ORDER — IPRATROPIUM-ALBUTEROL 0.5-2.5 (3) MG/3ML IN SOLN
3.0000 mL | Freq: Once | RESPIRATORY_TRACT | Status: AC
  Administered 2023-02-05: 3 mL via RESPIRATORY_TRACT
  Filled 2023-02-05: qty 3

## 2023-02-05 MED ORDER — PERFLUTREN LIPID MICROSPHERE
1.0000 mL | INTRAVENOUS | Status: AC | PRN
  Administered 2023-02-05: 4 mL via INTRAVENOUS

## 2023-02-05 MED ORDER — ACETAMINOPHEN 500 MG PO TABS
1000.0000 mg | ORAL_TABLET | Freq: Four times a day (QID) | ORAL | Status: DC | PRN
  Administered 2023-02-05 – 2023-02-08 (×9): 1000 mg via ORAL
  Filled 2023-02-05 (×9): qty 2

## 2023-02-05 MED ORDER — HYDRALAZINE HCL 10 MG PO TABS
10.0000 mg | ORAL_TABLET | Freq: Three times a day (TID) | ORAL | Status: DC
  Administered 2023-02-05 – 2023-02-10 (×15): 10 mg via ORAL
  Filled 2023-02-05 (×15): qty 1

## 2023-02-05 MED ORDER — PREDNISONE 20 MG PO TABS
40.0000 mg | ORAL_TABLET | Freq: Every day | ORAL | Status: AC
  Administered 2023-02-05 – 2023-02-08 (×4): 40 mg via ORAL
  Filled 2023-02-05 (×4): qty 2

## 2023-02-05 MED ORDER — SODIUM ZIRCONIUM CYCLOSILICATE 10 G PO PACK
10.0000 g | PACK | Freq: Two times a day (BID) | ORAL | Status: DC
  Administered 2023-02-05: 10 g via ORAL
  Filled 2023-02-05: qty 1

## 2023-02-05 NOTE — Progress Notes (Signed)
*  PRELIMINARY RESULTS* Echocardiogram 2D Echocardiogram has been performed with Definity.  Stacey Drain 02/05/2023, 2:48 PM

## 2023-02-05 NOTE — Progress Notes (Signed)
Called to bedside as patient noted worsening chest discomfort to nursing team.   Patient states that over the past 3d she has had chest pressure with ambulation that resolves with rest. This current episode in the hospital was not provoked by ambulation or activity. She states that the pain does not radiate and she has no associated nausea and vomiting. She also states that she has had two ACS events in the past and her current symptoms are not like the symptoms she had with those events.   On exam Patient was on heat high flow with O2 sats of 100% Vitals:   02/05/23 0014 02/05/23 0159  BP:  127/77  Pulse:  91  Resp:    Temp:  97.9 F (36.6 C)  SpO2: 93%    Card: RRR, no murmurs, elevated JVP ~10cm, pedal edema Pulm: diffuse wheezing, on heated high flow    Plan:  In addition to HF exacerbation, possible component of COPD. Will give patient duoneb treatment to see if this helps with her symptoms. She had troponins obtained earlier today and they trended flat, low suspicion for ACS. Will follow up 12 lead ECG as well.  Will give tessalon perles for cough.

## 2023-02-05 NOTE — Hospital Course (Addendum)
6/8: Didn't sleep well last night Breathing is okay - feels a little better  Cough is getting better but was getting worse at home for 2-3 days with more yellow sputum This is the first time she has gotten up She slept upright  Legs are a little more swollen than  baseline - right is more swollen now but usually left is   On 10 ish L [place]

## 2023-02-05 NOTE — Progress Notes (Addendum)
HD#0 Subjective:   Summary: Angela Lucero is a 82 y.o. female with a past medical history of HFrEF, hypertension, type 2 diabetes who presents to the emergency department with concerns of shortness of breath.  Patient admitted for further evaluation and management of acute hypoxic respiratory failure.   Overnight Events: Overnight, patient did have some chest discomfort.  Patient was given DuoNebs.  Patient had EKG which did not reveal any concern for ischemia.  At bedside today, patient states she is having some chest pain here and there.  She states her shortness of breath is improved.  During my exam, I visualized the patient moving from bed to recliner.  Patient did get short of breath during this episode.  Patient states that she has had increased cough for the last few days, increased sputum, as well as change in color of sputum.  She states she has been wheezing as well.  Objective:  Vital signs in last 24 hours: Vitals:   02/05/23 0155 02/05/23 0159 02/05/23 0531 02/05/23 1115  BP:  127/77 117/70 116/70  Pulse:  91 89 88  Resp:   19 20  Temp:  97.9 F (36.6 C) 98 F (36.7 C)   TempSrc:  Oral Oral   SpO2: 96%  97% 97%  Weight:   83.5 kg   Height:       Supplemental O2: HFNC SpO2: 97 % O2 Flow Rate (L/min): (S) 30 L/min FiO2 (%): 60 %   Physical Exam:  Constitutional: Resting in bed, no acute distress HENT: normocephalic atraumatic Cardiovascular: regular rate and rhythm, no m/r/g Pulmonary/Chest: Normal work of breathing on heated high flow.  Expiratory wheezing appreciated.  Bibasilar crackles appreciated. Extremities: 1+ pitting edema bilaterally  Filed Weights   02/04/23 1150 02/05/23 0531  Weight: 82.6 kg 83.5 kg     Intake/Output Summary (Last 24 hours) at 02/05/2023 1309 Last data filed at 02/05/2023 0201 Gross per 24 hour  Intake 400 ml  Output 850 ml  Net -450 ml   Net IO Since Admission: -450 mL [02/05/23 1309]  Pertinent Labs:    Latest Ref Rng &  Units 02/05/2023    4:29 AM 02/04/2023   11:52 AM 12/18/2021   12:55 AM  CBC  WBC 4.0 - 10.5 K/uL 11.0  8.9  7.1   Hemoglobin 12.0 - 15.0 g/dL 16.1  09.6  04.5   Hematocrit 36.0 - 46.0 % 37.7  40.0  37.8   Platelets 150 - 400 K/uL 312  340  188        Latest Ref Rng & Units 02/05/2023    9:51 AM 02/05/2023    4:29 AM 02/04/2023    9:43 PM  CMP  Glucose 70 - 99 mg/dL 409  82  811   BUN 8 - 23 mg/dL 53  53  50   Creatinine 0.44 - 1.00 mg/dL 9.14  7.82  9.56   Sodium 135 - 145 mmol/L 128  124  126   Potassium 3.5 - 5.1 mmol/L 4.5  5.7  5.1   Chloride 98 - 111 mmol/L 88  88  88   CO2 22 - 32 mmol/L 25  21  22    Calcium 8.9 - 10.3 mg/dL 8.8  8.7  9.4     Imaging: No results found.  Assessment/Plan:   Principal Problem:   Acute hypoxic respiratory failure (HCC) Active Problems:   Type 2 diabetes mellitus with complication, without long-term current use of insulin (HCC)   Congestive  heart failure (CHF) (HCC)   Smoking   Dyslipidemia   Hyponatremia   Elevated troponin   Acute on chronic hypoxic respiratory failure (HCC)   COPD exacerbation (HCC)   Acute on chronic HFrEF (heart failure with reduced ejection fraction) (HCC)   Patient Summary: Angela Lucero is a 82 y.o. female with a past medical history of HFrEF, hypertension, type 2 diabetes who presents to the emergency department with concerns of shortness of breath.  Patient admitted for further evaluation and management of acute hypoxic respiratory failure.   #Acute hypoxic respiratory failure #Acute on chronic HFrEF (Ischemic cardiomyopathy) #CAD status post stenting Patient states her breathing is improving.  Patient is now on heated high flow oxygen.  She states that she feels she is improving.  Patient has had good urine output overnight.  On exam, patient does seem fluid overloaded.  Cardiology following.  Will continue with diuresing.  Cardiology is not pursuing ischemic eval at this time.  They do state that she does have known  occluded LAD, but are managing with medical management given unable to intervene on. -Cardiology following -Cardiology recommending increasing Lasix to 80 mg twice daily -Cardiology recommending adding hydralazine 10 mg 3 times daily for afterload reduction -Continue home Imdur 60 mg daily, atorvastatin 80 mg daily, aspirin 81 mg daily -TTE pending -Hold home Jardiance 25 mg daily -Resume home metoprolol succinate 25 mg daily -Hold home Entresto 24-26 mg twice daily -Strict I's and O's -Daily weights  #COPD exacerbation Patient COPD exacerbation likely secondary to above.  Unclear if heart failure exacerbation led to COPD exacerbation, or COPD exacerbation led to heart failure exacerbation.  Patient noticed increased cough, change in sputum, increased sputum.  Will continue home inhalers.  Will add prednisone. -Start prednisone 40 mg daily -Continue home inhalers with Incruse Ellipta -Scheduled DuoNebs 3 times daily   #Pre-Renal AKI #Hyponatremia #Hyperkalemia Likely AKI secondary to heart failure.  Patient's baseline creatinine around 1.3-1.4.  Creatinine is hovering around 1.81 today.  Patient sodium increased to 12 5, but decreased back to 124.  Will obtain urine studies today.  Patient did also have elevated potassium at 5.7 today.  Unclear etiology given diureses should have decreased potassium.  Could be component of adrenal insufficiency or RTA 4. Could also just be because of kidney dysfunction. Will continue to monitor.  Will give Lokelma. -P.M. BMP pending -Monitor urine output -Lokelma x 1 -Urine studies pending   #Type 2 diabetes mellitus A1c 6.0.  Will continue to hold Jardiance in the setting of AKI at this time. -Continue to monitor BMP   Diet: Heart Healthy IVF: None,None VTE: Enoxaparin Code: DNR/DNI PT/OT recs: Pending, none.  Dispo: Anticipated discharge to Home in 2 days pending medical improvement.   Modena Slater DO Internal Medicine Resident  PGY-1 [phone] Please contact the on call pager after 5 pm and on weekends at [phone].

## 2023-02-05 NOTE — Progress Notes (Signed)
Cardiology Progress Note  Patient ID: Angela Lucero MRN: [id number] DOB: 21-Apr-1941 Date of Encounter: 02/05/2023  Primary Cardiologist: Gypsy Balsam, MD  Subjective   Chief Complaint: SOB  HPI: Remains volume overloaded.  Urine output suboptimal.  Lasix increased this morning.  ROS:  All other ROS reviewed and negative. Pertinent positives noted in the HPI.     Inpatient Medications  Scheduled Meds:  aspirin EC  81 mg Oral Daily   atorvastatin  80 mg Oral Daily   enoxaparin (LOVENOX) injection  30 mg Subcutaneous Q24H   furosemide  80 mg Intravenous BID   ipratropium-albuterol  3 mL Nebulization TID   isosorbide mononitrate  60 mg Oral Daily   predniSONE  40 mg Oral Q breakfast   sodium zirconium cyclosilicate  10 g Oral BID   umeclidinium bromide  1 puff Inhalation Daily   Continuous Infusions:  PRN Meds: acetaminophen, albuterol, polyethylene glycol   Vital Signs   Vitals:   02/05/23 0144 02/05/23 0155 02/05/23 0159 02/05/23 0531  BP:   127/77 117/70  Pulse:   91 89  Resp:    19  Temp:   97.9 F (36.6 C) 98 F (36.7 C)  TempSrc:   Oral Oral  SpO2: 90% 96%  97%  Weight:    83.5 kg  Height:        Intake/Output Summary (Last 24 hours) at 02/05/2023 0955 Last data filed at 02/05/2023 0201 Gross per 24 hour  Intake 400 ml  Output 850 ml  Net -450 ml      02/05/2023    5:31 AM 02/04/2023   11:50 AM 02/24/2022   10:46 AM  Last 3 Weights  Weight (lbs) 184 lb 1.4 oz 182 lb 1.6 oz 182 lb 3.2 oz  Weight (kg) 83.5 kg 82.6 kg 82.645 kg      Telemetry  Overnight telemetry shows sinus rhythm 80s, which I personally reviewed.   ECG  The most recent ECG shows sinus rhythm heart rate 93, right bundle branch block with left anterior fascicular block, which I personally reviewed.   Physical Exam   Vitals:   02/05/23 0144 02/05/23 0155 02/05/23 0159 02/05/23 0531  BP:   127/77 117/70  Pulse:   91 89  Resp:    19  Temp:   97.9 F (36.6 C) 98 F (36.7 C)  TempSrc:    Oral Oral  SpO2: 90% 96%  97%  Weight:    83.5 kg  Height:        Intake/Output Summary (Last 24 hours) at 02/05/2023 0955 Last data filed at 02/05/2023 0201 Gross per 24 hour  Intake 400 ml  Output 850 ml  Net -450 ml       02/05/2023    5:31 AM 02/04/2023   11:50 AM 02/24/2022   10:46 AM  Last 3 Weights  Weight (lbs) 184 lb 1.4 oz 182 lb 1.6 oz 182 lb 3.2 oz  Weight (kg) 83.5 kg 82.6 kg 82.645 kg    Body mass index is 29.71 kg/m.  General: Ill-appearing Head: Atraumatic, normal size  Eyes: PEERLA, EOMI  Neck: Supple, JVD 12 to 15 cm of water Endocrine: No thryomegaly Cardiac: Normal S1, S2; RRR; no murmurs, rubs, or gallops Lungs: Rales bilaterally Abd: Soft, nontender, no hepatomegaly  Ext: 1+ pitting edema Musculoskeletal: No deformities, BUE and BLE strength normal and equal Skin: Warm and dry, no rashes   Neuro: Alert and oriented to person, place, time, and situation, CNII-XII grossly intact, no focal  deficits  Psych: Normal mood and affect   Labs  High Sensitivity Troponin:   Recent Labs  Lab 02/04/23 1152 02/04/23 1514  TROPONINIHS 147* 136*     Cardiac EnzymesNo results for input(s): "TROPONINI" in the last 168 hours. No results for input(s): "TROPIPOC" in the last 168 hours.  Chemistry Recent Labs  Lab 02/04/23 1152 02/04/23 1746 02/04/23 2143 02/05/23 0429  NA 122* 125* 126* 124*  K 5.0 4.9 5.1 5.7*  CL 88* 88* 88* 88*  CO2 21* 21* 22 21*  GLUCOSE 43* 107* 158* 82  BUN 45* 48* 50* 53*  CREATININE 1.89* 1.67* 1.84* 1.81*  CALCIUM 9.2 9.1 9.4 8.7*  PROT 6.8  --   --   --   ALBUMIN 3.3*  --   --   --   AST 56*  --   --   --   ALT 35  --   --   --   ALKPHOS 33*  --   --   --   BILITOT 0.8  --   --   --   GFRNONAA 27* 31* 27* 28*  ANIONGAP 13 16* 16* 15    Hematology Recent Labs  Lab 02/04/23 1152 02/05/23 0429  WBC 8.9 11.0*  RBC 4.26 4.22  HGB 13.2 12.8  HCT 40.0 37.7  MCV 93.9 89.3  MCH 31.0 30.3  MCHC 33.0 34.0  RDW 14.6 14.6  PLT  340 312   BNP Recent Labs  Lab 02/04/23 1152  BNP 3,951.4*    DDimer No results for input(s): "DDIMER" in the last 168 hours.   Radiology  DG Chest Port 1 View  Result Date: 02/04/2023 CLINICAL DATA:  coughm, sob, copd, chf EXAM: PORTABLE CHEST 1 VIEW COMPARISON:  CXR 12/13/21 FINDINGS: The heart size and mediastinal contours are within normal limits. Aortic atherosclerotic calcifications. No pleural effusion. No pneumothorax. Hazy bibasilar airspace opacities could represent atelectasis or infection. The visualized skeletal structures are unremarkable. IMPRESSION: Hazy bibasilar airspace opacities could represent atelectasis or infection. Electronically Signed   By: Lorenza Cambridge M.D.   On: 02/04/2023 12:15    Cardiac Studies  LHC 12/14/2021 Severe single-vessel coronary artery disease with chronic total occlusion of mid LAD with right-to-left and left-to-left collaterals.  There is moderate LCx and RCA disease. Patent distal RCA stent with mild in-stent restenosis (~10%). Mildly elevated left ventricular filling pressure (LVEDP ~20-25 mmHg)  CMR 12/16/2021 IMPRESSION: 1.  No right atrial mass seen   2. Subendocardial late gadolinium enhancement consistent with prior infarct in LAD territory (basal anteroseptum, mid anterior/anteroseptal, apical anterior/septal, and apex). LGE is greater than 50% transmural suggesting nonviability in this territory   3.  Severe LV dilatation with severe systolic dysfunction (EF 24%)   4.  Normal RV size and systolic function (EF 52%)   5.  Moderate mitral regurgitation (regurgitant fraction 26%)   6.  Opacity at left lung base, recommend dedicated lung imaging  Patient Profile  Angela Lucero is a 82 y.o. female with systolic heart failure (EF 24%), CAD, CTO of mid LAD with collaterals, diabetes, CKD IIIb admitted on 02/04/2023 for acute on chronic systolic heart failure.  Assessment & Plan   # Acute on chronic systolic heart failure, EF 24% #  Ischemic cardiomyopathy # AKI -Volume overloaded.  Increase Lasix to 80 mg IV twice daily. -Entresto/spironolactone/SGLT2 inhibitor held due to AKI.  Continue metoprolol succinate 25 mg daily.  Continue Imdur 60 mg daily.  Add hydralazine 10 mg 3 times daily  for better afterload reduction. -Repeat echo pending.  # Elevated troponin, demand ischemia # CAD CTO mid LAD (nonviable) -She reports chest discomfort.  She has a known occluded mid LAD.  It is not surprising for her to have chest discomfort and minimally elevated troponins in setting of decompensated heart failure.  Recent cath last year with no targets for revascularization.  Plan for medical management. -Continue aspirin 81 mg daily, Lipitor 80 mg daily, Imdur.  # Hypervolemic hyponatremia -Continue with diuresis.  # AKI # CKD IIIb # Hyperkalemia -Secondary to AKI.  Close monitoring.  For questions or updates, please contact [facility] Please consult [email or web address] for contact info under        Signed, Gerri Spore T. Flora Lipps, MD, [facility]  [facility]  02/05/2023 9:55 AM

## 2023-02-06 DIAGNOSIS — I5023 Acute on chronic systolic (congestive) heart failure: Secondary | ICD-10-CM

## 2023-02-06 DIAGNOSIS — E871 Hypo-osmolality and hyponatremia: Secondary | ICD-10-CM | POA: Diagnosis not present

## 2023-02-06 DIAGNOSIS — N179 Acute kidney failure, unspecified: Secondary | ICD-10-CM

## 2023-02-06 DIAGNOSIS — R7989 Other specified abnormal findings of blood chemistry: Secondary | ICD-10-CM | POA: Diagnosis not present

## 2023-02-06 DIAGNOSIS — J9601 Acute respiratory failure with hypoxia: Secondary | ICD-10-CM | POA: Diagnosis not present

## 2023-02-06 DIAGNOSIS — J441 Chronic obstructive pulmonary disease with (acute) exacerbation: Secondary | ICD-10-CM | POA: Diagnosis not present

## 2023-02-06 DIAGNOSIS — I2510 Atherosclerotic heart disease of native coronary artery without angina pectoris: Secondary | ICD-10-CM | POA: Diagnosis not present

## 2023-02-06 DIAGNOSIS — N189 Chronic kidney disease, unspecified: Secondary | ICD-10-CM

## 2023-02-06 HISTORY — DX: Acute kidney failure, unspecified: N17.9

## 2023-02-06 LAB — CORTISOL: Cortisol, Plasma: 26.4 ug/dL

## 2023-02-06 LAB — CBC
HCT: 36.7 % (ref 36.0–46.0)
Hemoglobin: 12.3 g/dL (ref 12.0–15.0)
MCH: 30.2 pg (ref 26.0–34.0)
MCHC: 33.5 g/dL (ref 30.0–36.0)
MCV: 90.2 fL (ref 80.0–100.0)
Platelets: 316 10*3/uL (ref 150–400)
RBC: 4.07 MIL/uL (ref 3.87–5.11)
RDW: 14.6 % (ref 11.5–15.5)
WBC: 10.7 10*3/uL — ABNORMAL HIGH (ref 4.0–10.5)
nRBC: 0 % (ref 0.0–0.2)

## 2023-02-06 LAB — BASIC METABOLIC PANEL
Anion gap: 12 (ref 5–15)
BUN: 58 mg/dL — ABNORMAL HIGH (ref 8–23)
CO2: 24 mmol/L (ref 22–32)
Calcium: 8.4 mg/dL — ABNORMAL LOW (ref 8.9–10.3)
Chloride: 90 mmol/L — ABNORMAL LOW (ref 98–111)
Creatinine, Ser: 1.55 mg/dL — ABNORMAL HIGH (ref 0.44–1.00)
GFR, Estimated: 34 mL/min — ABNORMAL LOW (ref >60)
Glucose, Bld: 151 mg/dL — ABNORMAL HIGH (ref 70–99)
Potassium: 4.1 mmol/L (ref 3.5–5.1)
Sodium: 126 mmol/L — ABNORMAL LOW (ref 135–145)

## 2023-02-06 LAB — CULTURE, BLOOD (ROUTINE X 2): Culture: NO GROWTH

## 2023-02-06 MED ORDER — FUROSEMIDE 10 MG/ML IJ SOLN
120.0000 mg | Freq: Two times a day (BID) | INTRAVENOUS | Status: DC
  Administered 2023-02-06 – 2023-02-08 (×5): 120 mg via INTRAVENOUS
  Filled 2023-02-06: qty 2
  Filled 2023-02-06: qty 12
  Filled 2023-02-06 (×4): qty 10
  Filled 2023-02-06: qty 12

## 2023-02-06 MED ORDER — FUROSEMIDE 10 MG/ML IJ SOLN
40.0000 mg | Freq: Once | INTRAMUSCULAR | Status: AC
  Administered 2023-02-06: 40 mg via INTRAVENOUS
  Filled 2023-02-06: qty 4

## 2023-02-06 MED ORDER — SIMETHICONE 80 MG PO CHEW
80.0000 mg | CHEWABLE_TABLET | Freq: Four times a day (QID) | ORAL | Status: DC | PRN
  Administered 2023-02-06 – 2023-02-08 (×3): 80 mg via ORAL
  Filled 2023-02-06 (×3): qty 1

## 2023-02-06 NOTE — Progress Notes (Signed)
Mobility Specialist Progress Note:   02/06/23 0935  Mobility  Activity Ambulated with assistance in room  Level of Assistance Contact guard assist, steadying assist  Assistive Device Front wheel walker  Distance Ambulated (ft) 6 ft  Activity Response Tolerated fair  Mobility Referral Yes  $Mobility charge 1 Mobility  Mobility Specialist Start Time (ACUTE ONLY) 0935  Mobility Specialist Stop Time (ACUTE ONLY) 0946  Mobility Specialist Time Calculation (min) (ACUTE ONLY) 11 min   Pt requesting to return to bed. Required only minG assist throughout transfer. Pt c/o generalized weakness + fatigue. VSS on 6LO2. Pt left in bed with all needs met, bed alarm on.   Addison Lank Mobility Specialist Please contact via SecureChat or  Rehab office at [phone]

## 2023-02-06 NOTE — Progress Notes (Signed)
Received page from nursing team that patient was anxious after speaking with her son. Went to discuss further with patient and she was resting comfortably. Did not disturb patient at this time.

## 2023-02-06 NOTE — Progress Notes (Signed)
IMTS Daily Note  Subjective: Patient feels okay today. Her breathing feels better compared to yesterday, but she's still having trouble breathing with ambulation. She worked with PT this morning, and is very tired now. No chest pain.   I saw her together with Cardiology, who reviewed TTE results.   I discussed Palliative Care with Ms Renwick and her son.  Objective:  Vital signs in last 24 hours: Vitals:   02/05/23 2339 02/06/23 0352 02/06/23 0400 02/06/23 0736  BP: 129/73 124/68  129/72  Pulse: 87 88  86  Resp: 20 18  18   Temp: (!) 97.5 F (36.4 C) 97.6 F (36.4 C)  98.1 F (36.7 C)  TempSrc: Oral Oral  Axillary  SpO2: 96% 91%  94%  Weight:   84.8 kg   Height:       GEN: elderly woman, sitting in chair, in no acute distress CV: rrr, no m/r/g PULM: normal work of breathing on 6Lnc, +bibasilar crackles, no wheezes EXT: warm, well perfused, 1+ pitting edema to shins bilaterally   Assessment/Plan:  Principal Problem:   Acute hypoxic respiratory failure (HCC) Active Problems:   Type 2 diabetes mellitus with complication, without long-term current use of insulin (HCC)   Congestive heart failure (CHF) (HCC)   Smoking   Dyslipidemia   Hyponatremia   Elevated troponin   Acute on chronic hypoxic respiratory failure (HCC)   COPD exacerbation (HCC)   Acute on chronic HFrEF (heart failure with reduced ejection fraction) (HCC)  82yo woman with HFrEF (EF now 20-25%), COPD, CKD3b who presented with shortness of breath 2/2 acute CHF & COPD exacerbation  # Acute hypoxic respiratory failure # Acute on chronic HFrEF 2/2 ischemic cardiomyopathy Unfortunately, her TTE showed that her EF has declined from 30-35% to 20-25% and she now has severe mitral regurgitation. Dr. Flora Lipps and I discussed with the patient how much of her cardiac disease is not reversible. She understands this. She is DNR/DNI and would not want "heroic" measures taken to extend her life, but would want [place] to treat  reversible causes of illness with medicine. I'm hoping some of this fluid overload is reversible and that we can get her feeling better.  - Cardiology increased Lasix to 120mg  IV BID today  - Continue Metoprolol 25mg  daily - Keep holding Entresto & SGLT2 until GFR is stable. It looks like Cr was 1.3-1.6 last year, so she may be at her baseline - Use Hydralazine 10mg  TID acutely for afterload reduction, but hopefully will not need to discharge on this medicine  - Cardiology introduced the concept of Palliative Care today, and patient was interested. Consider consulting on Monday for either inpatient or outpatient consultation.   # COPD exacerbation I think the driving force of her hypoxia is fluid overload, but she did have increased cough, sputum production, and more yellow sputum on admission with wheezing  - Continue Prednisone 40mg  daily x5d - Continue home Incruse Ellipta   # CAD - Aspirin 81mg  daily - Atorvastatin 80mg  daily   # CKD3 - I think she's at her baseline GFR. Will check BMP daily   # Hyponatremia -Na 126. I think she has hypervolemic hyponatremia from her CHF exacerbation. Will continue to treat CHF exacerbation  - Daily BMPs  # T2DM - A1C 6.0. Check glucose with morning labs, does not need finger sticks   Core Measures - VTE ppx: Lovenox 30mg  daily  - Code: DNR/DNI     Dispo: Anticipated discharge in approximately 3 day(s) pending  adequate diuresis  Mercie Eon, MD 02/06/2023, 7:43 AM

## 2023-02-06 NOTE — Evaluation (Signed)
Physical Therapy Evaluation Patient Details Name: Angela Lucero MRN: [id number] DOB: [birth year] Today's Date: 02/06/2023  History of Present Illness  81yo woman who presented 02/04/23 with acute progressive shortness of breath associated with acute sharp chest pain.+acute CHF, COPD exacerbation; required HFNC O2; PMH COPD, tobacco use, HFrEF (EF 30-35% in 11/2021), DM, CAD, CVA, HTN  Clinical Impression   Pt admitted secondary to problem above with deficits below. PTA patient was living alone in a trailer with 3 steps to enter. She has not driven in 1 year due to lack of car. Her neighbor gets her groceries for her. Pt currently requires minguard assist for bed to [facility] to chair transfers and is limited by dyspnea and generalized fatigue/weakness. Pt is highly motivated and saturations >92% on 6L throughout activity. Did not feel strong enough to ambulate. +DOE Feel patient could benefit from intensive rehab prior to return home alone.  Anticipate patient will benefit from PT to address problems listed below.Will continue to follow acutely to maximize functional mobility independence and safety.          Recommendations for follow up therapy are one component of a multi-disciplinary discharge planning process, led by the attending physician.  Recommendations may be updated based on patient status, additional functional criteria and insurance authorization.  Follow Up Recommendations       Assistance Recommended at Discharge Set up Supervision/Assistance  Patient can return home with the following  A little help with walking and/or transfers;A little help with bathing/dressing/bathroom;Assistance with cooking/housework;Assist for transportation;Help with stairs or ramp for entrance (*reports she has no one that can assist 24/7)    Equipment Recommendations Other (comment) (?rollator (may be too big for her trailer))  Recommendations for Other Services  Rehab consult;OT consult    Functional Status  Assessment Patient has had a recent decline in their functional status and demonstrates the ability to make significant improvements in function in a reasonable and predictable amount of time.     Precautions / Restrictions Precautions Precautions: Fall Precaution Comments: denies h/o falls; watch sats Restrictions Weight Bearing Restrictions: No      Mobility  Bed Mobility Overal bed mobility: Needs Assistance Bed Mobility: Supine to Sit     Supine to sit: Min guard, HOB elevated     General bed mobility comments: HOB remained elevated due to dyspnea; +use of rail; near need for assist however ultimately no physical assist given    Transfers Overall transfer level: Needs assistance Equipment used: Rolling walker (2 wheels) Transfers: Sit to/from Stand, Bed to chair/wheelchair/BSC Sit to Stand: Min guard   Step pivot transfers: Min guard       General transfer comment: bed to BSC; BSC to chair; pt with flexed posture and small, short steps; encouraged slow pace with PLB    Ambulation/Gait               General Gait Details: unable more than pivotal steps with RW between surfaces; reports she feels too weak to ambulate; +incr dyspnea with transfers  Stairs            Wheelchair Mobility    Modified Rankin (Stroke Patients Only)       Balance Overall balance assessment: Needs assistance Sitting-balance support: No upper extremity supported, Feet supported Sitting balance-Leahy Scale: Good     Standing balance support: Bilateral upper extremity supported Standing balance-Leahy Scale: Poor  Pertinent Vitals/Pain Pain Assessment Pain Assessment: 0-10 Pain Score: 9  Pain Location: back Pain Descriptors / Indicators: Aching Pain Intervention(s): Limited activity within patient's tolerance, Monitored during session, Repositioned    Home Living Family/patient expects to be discharged to:: Private  residence Living Arrangements: Alone Available Help at Discharge: Neighbor;Available PRN/intermittently Type of Home: Mobile home Home Access: Stairs to enter Entrance Stairs-Rails: Left Entrance Stairs-Number of Steps: 3   Home Layout: One level Home Equipment: Cane - single Librarian, academic (2 wheels)      Prior Function Prior Level of Function : Independent/Modified Independent             Mobility Comments: no device in home; cane in community ADLs Comments: neighbor gets her groceries; has not driven for a year (no car)     Hand Dominance        Extremity/Trunk Assessment   Upper Extremity Assessment Upper Extremity Assessment: Defer to OT evaluation;Generalized weakness    Lower Extremity Assessment Lower Extremity Assessment: Generalized weakness (grossly 4/5)    Cervical / Trunk Assessment Cervical / Trunk Assessment: Other exceptions Cervical / Trunk Exceptions: overweight  Communication   Communication: No difficulties  Cognition Arousal/Alertness: Awake/alert Behavior During Therapy: WFL for tasks assessed/performed Overall Cognitive Status: Within Functional Limits for tasks assessed                                 General Comments: cognition not specifically tested beyond a&ox4        General Comments General comments (skin integrity, edema, etc.): on 6L HFNC with sats 93-95% throughout; HR 90s; denied dizziness    Exercises     Assessment/Plan    PT Assessment Patient needs continued PT services  PT Problem List Decreased strength;Decreased activity tolerance;Decreased balance;Decreased mobility;Decreased knowledge of use of DME;Cardiopulmonary status limiting activity;Obesity;Pain       PT Treatment Interventions DME instruction;Gait training;Stair training;Functional mobility training;Therapeutic activities;Therapeutic exercise;Balance training;Patient/family education    PT Goals (Current goals can be found in the  Care Plan section)  Acute Rehab PT Goals Patient Stated Goal: regain independence and go home PT Goal Formulation: With patient Time For Goal Achievement: 02/20/23 Potential to Achieve Goals: Good    Frequency Min 1X/week     Co-evaluation               AM-PAC PT "6 Clicks" Mobility  Outcome Measure Help needed turning from your back to your side while in a flat bed without using bedrails?: A Little Help needed moving from lying on your back to sitting on the side of a flat bed without using bedrails?: A Little Help needed moving to and from a bed to a chair (including a wheelchair)?: A Little Help needed standing up from a chair using your arms (e.g., wheelchair or bedside chair)?: A Little Help needed to walk in hospital room?: Total Help needed climbing 3-5 steps with a railing? : Total 6 Click Score: 14    End of Session Equipment Utilized During Treatment: Oxygen Activity Tolerance: Patient limited by fatigue Patient left: in chair;with call bell/phone within reach;with chair alarm set;with nursing/sitter in room Nurse Communication: Mobility status PT Visit Diagnosis: Other abnormalities of gait and mobility (R26.89);Muscle weakness (generalized) (M62.81)    Time: 1610-9604 PT Time Calculation (min) (ACUTE ONLY): 33 min   Charges:   PT Evaluation $PT Eval Low Complexity: 1 Low PT Treatments $Gait Training: 8-22 mins  Jerolyn Center, PT Acute Rehabilitation Services  Office [phone]   Zena Amos 02/06/2023, 8:50 AM

## 2023-02-06 NOTE — Progress Notes (Signed)
Cardiology Progress Note  Patient ID: Angela Lucero MRN: [id number] DOB: [birth year] Date of Encounter: 02/06/2023  Primary Cardiologist: Gypsy Balsam, MD  Subjective   Chief Complaint: SOB  HPI: The -1 L.  Increase Lasix.  Kidney function stable.  Echo with worsening ejection fraction.  ROS:  All other ROS reviewed and negative. Pertinent positives noted in the HPI.     Inpatient Medications  Scheduled Meds:  aspirin EC  81 mg Oral Daily   atorvastatin  80 mg Oral Daily   enoxaparin (LOVENOX) injection  30 mg Subcutaneous Q24H   furosemide  40 mg Intravenous Once   hydrALAZINE  10 mg Oral Q8H   ipratropium-albuterol  3 mL Nebulization TID   isosorbide mononitrate  60 mg Oral Daily   metoprolol succinate  25 mg Oral Daily   predniSONE  40 mg Oral Q breakfast   umeclidinium bromide  1 puff Inhalation Daily   Continuous Infusions:  furosemide     PRN Meds: acetaminophen, albuterol, mouth rinse, polyethylene glycol   Vital Signs   Vitals:   02/06/23 0352 02/06/23 0400 02/06/23 0736 02/06/23 0851  BP: 124/68  129/72   Pulse: 88  86 94  Resp: 18  18 20   Temp: 97.6 F (36.4 C)  98.1 F (36.7 C)   TempSrc: Oral  Axillary   SpO2: 91%  94% 94%  Weight:  84.8 kg    Height:        Intake/Output Summary (Last 24 hours) at 02/06/2023 0958 Last data filed at 02/06/2023 0358 Gross per 24 hour  Intake 120 ml  Output 1200 ml  Net -1080 ml      02/06/2023    4:00 AM 02/05/2023    5:31 AM 02/04/2023   11:50 AM  Last 3 Weights  Weight (lbs) 186 lb 15.2 oz 184 lb 1.4 oz 182 lb 1.6 oz  Weight (kg) 84.8 kg 83.5 kg 82.6 kg      Telemetry  Overnight telemetry shows sinus rhythm 90s, PVCs noted, which I personally reviewed.    Physical Exam   Vitals:   02/06/23 0352 02/06/23 0400 02/06/23 0736 02/06/23 0851  BP: 124/68  129/72   Pulse: 88  86 94  Resp: 18  18 20   Temp: 97.6 F (36.4 C)  98.1 F (36.7 C)   TempSrc: Oral  Axillary   SpO2: 91%  94% 94%  Weight:  84.8 kg     Height:        Intake/Output Summary (Last 24 hours) at 02/06/2023 0958 Last data filed at 02/06/2023 0358 Gross per 24 hour  Intake 120 ml  Output 1200 ml  Net -1080 ml       02/06/2023    4:00 AM 02/05/2023    5:31 AM 02/04/2023   11:50 AM  Last 3 Weights  Weight (lbs) 186 lb 15.2 oz 184 lb 1.4 oz 182 lb 1.6 oz  Weight (kg) 84.8 kg 83.5 kg 82.6 kg    Body mass index is 30.17 kg/m.  General: Well nourished, well developed, in no acute distress Head: Atraumatic, normal size  Eyes: PEERLA, EOMI  Neck: Supple, JVD 10-12 cmH2O Endocrine: No thryomegaly Cardiac: Normal S1, S2; RRR; no murmurs, rubs, or gallops Lungs: Crackles at the lung bases Abd: Soft, nontender, no hepatomegaly  Ext: 1+ pitting edema Musculoskeletal: No deformities, BUE and BLE strength normal and equal Skin: Warm and dry, no rashes   Neuro: Alert and oriented to person, place, time, and situation, CNII-XII grossly intact,  no focal deficits  Psych: Normal mood and affect   Labs  High Sensitivity Troponin:   Recent Labs  Lab 02/04/23 1152 02/04/23 1514  TROPONINIHS 147* 136*     Cardiac EnzymesNo results for input(s): "TROPONINI" in the last 168 hours. No results for input(s): "TROPIPOC" in the last 168 hours.  Chemistry Recent Labs  Lab 02/04/23 1152 02/04/23 1746 02/05/23 0951 02/05/23 1719 02/05/23 1951 02/06/23 0113  NA 122*   < > 128* 127*  --  126*  K 5.0   < > 4.5 5.8* 4.5 4.1  CL 88*   < > 88* 89*  --  90*  CO2 21*   < > 25 22  --  24  GLUCOSE 43*   < > 100* 150*  --  151*  BUN 45*   < > 53* 56*  --  58*  CREATININE 1.89*   < > 1.76* 1.68*  --  1.55*  CALCIUM 9.2   < > 8.8* 8.7*  --  8.4*  PROT 6.8  --   --   --   --   --   ALBUMIN 3.3*  --   --   --   --   --   AST 56*  --   --   --   --   --   ALT 35  --   --   --   --   --   ALKPHOS 33*  --   --   --   --   --   BILITOT 0.8  --   --   --   --   --   GFRNONAA 27*   < > 29* 31*  --  34*  ANIONGAP 13   < > 15 16*  --  12   < > = values  in this interval not displayed.    Hematology Recent Labs  Lab 02/04/23 1152 02/05/23 0429 02/06/23 0113  WBC 8.9 11.0* 10.7*  RBC 4.26 4.22 4.07  HGB 13.2 12.8 12.3  HCT 40.0 37.7 36.7  MCV 93.9 89.3 90.2  MCH 31.0 30.3 30.2  MCHC 33.0 34.0 33.5  RDW 14.6 14.6 14.6  PLT 340 312 316   BNP Recent Labs  Lab 02/04/23 1152  BNP 3,951.4*    DDimer No results for input(s): "DDIMER" in the last 168 hours.   Radiology  ECHOCARDIOGRAM COMPLETE  Result Date: 02/05/2023    ECHOCARDIOGRAM REPORT   Patient Name:   Angela Lucero  Date of Exam: 02/05/2023 Medical Rec #:  [id number]  Height:       66.0 in Accession #:    [id number] Weight:       184.1 lb Date of Birth:  November 01, 1940  BSA:          1.931 m Patient Age:    81 years   BP:           127/77 mmHg Patient Gender: F          HR:           93 bpm. [facility] Procedure: 2D Echo, Cardiac Doppler and Color Doppler Indications:    Congestive Heart Failure I50.9  History:        Patient has prior history of Echocardiogram examinations, most                 recent 12/15/2021. CHF and Cardiomyopathy, CAD and Previous  Myocardial Infarction, COPD; Risk Factors:Hypertension, Diabetes                 and Dyslipidemia.  Sonographer:    Celesta Gentile RCS Referring Phys: [id number] JULIE MACHEN IMPRESSIONS  1. Left ventricular ejection fraction, by estimation, is 20 to 25%. The left ventricle has severely decreased function. The left ventricle demonstrates global hypokinesis. The left ventricular internal cavity size was mildly dilated. Indeterminate diastolic filling due to E-A fusion.  2. Right ventricular systolic function is normal. The right ventricular size is mildly enlarged. There is moderately elevated pulmonary artery systolic pressure. The estimated right ventricular systolic pressure is 49.1 mmHg.  3. Left atrial size was severely dilated.  4. Right atrial size was mildly dilated.  5. The mitral valve is abnormal. Severe mitral  valve regurgitation. No evidence of mitral stenosis.  6. The aortic valve is tricuspid. Aortic valve regurgitation is not visualized. Aortic valve sclerosis/calcification is present, without any evidence of aortic stenosis.  7. The inferior vena cava is dilated in size with <50% respiratory variability, suggesting right atrial pressure of 15 mmHg. Comparison(s): Changes from prior study are noted. The left ventricular function is significantly worse. Severe MR is now present. FINDINGS  Left Ventricle: Left ventricular ejection fraction, by estimation, is 20 to 25%. The left ventricle has severely decreased function. The left ventricle demonstrates global hypokinesis. Definity contrast agent was given IV to delineate the left ventricular endocardial borders. The left ventricular internal cavity size was mildly dilated. There is no left ventricular hypertrophy. Indeterminate diastolic filling due to E-A fusion. Right Ventricle: The right ventricular size is mildly enlarged. No increase in right ventricular wall thickness. Right ventricular systolic function is normal. There is moderately elevated pulmonary artery systolic pressure. The tricuspid regurgitant velocity is 2.92 m/s, and with an assumed right atrial pressure of 15 mmHg, the estimated right ventricular systolic pressure is 49.1 mmHg. Left Atrium: Left atrial size was severely dilated. Right Atrium: Right atrial size was mildly dilated. Pericardium: There is no evidence of pericardial effusion. Mitral Valve: The mitral valve is abnormal. Severe mitral valve regurgitation. No evidence of mitral valve stenosis. MV peak gradient, 8.1 mmHg. The mean mitral valve gradient is 3.0 mmHg. Tricuspid Valve: The tricuspid valve is grossly normal. Tricuspid valve regurgitation is mild . No evidence of tricuspid stenosis. Aortic Valve: The aortic valve is tricuspid. Aortic valve regurgitation is not visualized. Aortic valve sclerosis/calcification is present, without any  evidence of aortic stenosis. Aortic valve mean gradient measures 2.0 mmHg. Aortic valve peak gradient measures 3.2 mmHg. Aortic valve area, by VTI measures 3.09 cm. Pulmonic Valve: The pulmonic valve was grossly normal. Pulmonic valve regurgitation is trivial. No evidence of pulmonic stenosis. Aorta: The aortic root is normal in size and structure. Venous: The inferior vena cava is dilated in size with less than 50% respiratory variability, suggesting right atrial pressure of 15 mmHg. IAS/Shunts: The atrial septum is grossly normal.  LEFT VENTRICLE PLAX 2D LVIDd:         6.10 cm LVIDs:         5.10 cm LV PW:         0.90 cm LV IVS:        0.80 cm LVOT diam:     1.90 cm LV SV:         48 LV SV Index:   25 LVOT Area:     2.84 cm  LV Volumes (MOD) LV vol d, MOD A2C: 148.0 ml LV vol d,  MOD A4C: 218.0 ml LV vol s, MOD A2C: 107.0 ml LV vol s, MOD A4C: 150.0 ml LV SV MOD A2C:     41.0 ml LV SV MOD A4C:     218.0 ml LV SV MOD BP:      52.8 ml RIGHT VENTRICLE RV S prime:     10.80 cm/s TAPSE (M-mode): 2.4 cm LEFT ATRIUM            Index        RIGHT ATRIUM           Index LA diam:      4.80 cm  2.49 cm/m   RA Area:     24.10 cm LA Vol (A2C): 113.0 ml 58.53 ml/m  RA Volume:   84.80 ml  43.93 ml/m LA Vol (A4C): 105.0 ml 54.39 ml/m  AORTIC VALVE AV Area (Vmax):    2.96 cm AV Area (Vmean):   2.61 cm AV Area (VTI):     3.09 cm AV Vmax:           89.40 cm/s AV Vmean:          68.800 cm/s AV VTI:            0.157 m AV Peak Grad:      3.2 mmHg AV Mean Grad:      2.0 mmHg LVOT Vmax:         93.40 cm/s LVOT Vmean:        63.350 cm/s LVOT VTI:          0.171 m LVOT/AV VTI ratio: 1.09  AORTA Ao Root diam: 3.40 cm MITRAL VALVE                  TRICUSPID VALVE MV Area (PHT): 4.46 cm       TR Peak grad:   34.1 mmHg MV Area VTI:   1.53 cm       TR Vmax:        292.00 cm/s MV Peak grad:  8.1 mmHg MV Mean grad:  3.0 mmHg       SHUNTS MV Vmax:       1.42 m/s       Systemic VTI:  0.17 m MV Vmean:      74.3 cm/s      Systemic Diam:  1.90 cm MV Decel Time: 170 msec MR Peak grad:    94.5 mmHg MR Mean grad:    45.0 mmHg MR Vmax:         486.00 cm/s MR Vmean:        298.0 cm/s MR PISA:         5.09 cm MR PISA Eff ROA: 24 mm MR PISA Radius:  0.90 cm MV E velocity: 124.00 cm/s Lennie Odor MD Electronically signed by Lennie Odor MD Signature Date/Time: 02/05/2023/3:35:07 PM    Final    DG Chest Port 1 View  Result Date: 02/04/2023 CLINICAL DATA:  coughm, sob, copd, chf EXAM: PORTABLE CHEST 1 VIEW COMPARISON:  CXR 12/13/21 FINDINGS: The heart size and mediastinal contours are within normal limits. Aortic atherosclerotic calcifications. No pleural effusion. No pneumothorax. Hazy bibasilar airspace opacities could represent atelectasis or infection. The visualized skeletal structures are unremarkable. IMPRESSION: Hazy bibasilar airspace opacities could represent atelectasis or infection. Electronically Signed   By: Lorenza Cambridge M.D.   On: 02/04/2023 12:15    Cardiac Studies  TTE 02/05/2023  1. Left ventricular ejection fraction, by estimation, is 20 to 25%. The  left ventricle has severely decreased function. The left ventricle  demonstrates global hypokinesis. The left ventricular internal cavity size  was mildly dilated. Indeterminate  diastolic filling due to E-A fusion.   2. Right ventricular systolic function is normal. The right ventricular  size is mildly enlarged. There is moderately elevated pulmonary artery  systolic pressure. The estimated right ventricular systolic pressure is  49.1 mmHg.   3. Left atrial size was severely dilated.   4. Right atrial size was mildly dilated.   5. The mitral valve is abnormal. Severe mitral valve regurgitation. No  evidence of mitral stenosis.   6. The aortic valve is tricuspid. Aortic valve regurgitation is not  visualized. Aortic valve sclerosis/calcification is present, without any  evidence of aortic stenosis.   7. The inferior vena cava is dilated in size with <50% respiratory   variability, suggesting right atrial pressure of 15 mmHg.   LHC 12/14/2021 Conclusions: Severe single-vessel coronary artery disease with chronic total occlusion of mid LAD with right-to-left and left-to-left collaterals.  There is moderate LCx and RCA disease. Patent distal RCA stent with mild in-stent restenosis (~10%). Mildly elevated left ventricular filling pressure (LVEDP ~20-25 mmHg).  Patient Profile  Angela Lucero is a 82 y.o. female with systolic heart failure (EF 24%), CAD, CTO of mid LAD with collaterals, diabetes, CKD IIIb admitted on 02/04/2023 for acute on chronic systolic heart failure.   Assessment & Plan   # Acute on chronic systolic heart failure, EF 20-25% # Ischemic cardiomyopathy, nonviable LAD territory # Severe mitral valve regurgitation # Acute kidney injury -Net -1 L.  Net -1.5 L since admission.  Still remains volume overloaded.  Increase Lasix to 120 mg IV twice daily.  GFR is improving.  We will consider metolazone if no improvement with higher dose Lasix today. -Continue metoprolol succinate 25 mg daily, Imdur 60 mg daily, hydralazine 10 mg 3 times daily.   -Entresto and SGLT2 inhibitor have been held due to AKI.  Kidney function is improving.  Suspecting get this back on in the next few days. -She also has severe mitral valve regurgitation which is related to her cardiomyopathy.  This is functional mitral valve regurgitation.  May benefit from structural heart evaluation as an outpatient.  For now treatment would be just diuresis. -Also discussed with the patient that she has a severe cardiomyopathy with advanced kidney disease.  She has very limited options for advanced heart failure therapies.  I discussed that palliative care may be a good approach.  She does express a desire to be DNR/DNI.  She is not wanting aggressive medical care but is okay with continued treatments.  Would recommend palliative care consultation to discuss goals of care.  # Elevated troponin,  demand ischemia # CTO of mid LAD (nonviable) -Troponin elevation is secondary to CTO of LAD in the setting of systolic heart failure.  Reports no further chest pain.  Continue aspirin and Lipitor.  Would recommend medical therapy.  # Hypervolemic hyponatremia # AKI # CKD stage IIIb -Sodium is low due to volume overload.  Kidney function is stable.  Lasix as above.      For questions or updates, please contact [facility] Please consult [email or web address] for contact info under        Signed, Gerri Spore T. Flora Lipps, MD, [facility]  [facility]  02/06/2023 9:58 AM

## 2023-02-06 NOTE — Plan of Care (Signed)

## 2023-02-06 NOTE — Progress Notes (Signed)
Inpatient Rehab Admissions Coordinator:  ? ?Per therapy recommendations,  patient was screened for CIR candidacy by Shazia Mitchener, MS, CCC-SLP. At this time, Pt. Appears to be a a potential candidate for CIR. I will place   order for rehab consult per protocol for full assessment. Please contact me any with questions. ? ?Mako Pelfrey, MS, CCC-SLP ?Rehab Admissions Coordinator  ?[phone] (celll) ?[phone] (office) ? ?

## 2023-02-07 DIAGNOSIS — J441 Chronic obstructive pulmonary disease with (acute) exacerbation: Secondary | ICD-10-CM

## 2023-02-07 DIAGNOSIS — N1832 Chronic kidney disease, stage 3b: Secondary | ICD-10-CM

## 2023-02-07 DIAGNOSIS — I340 Nonrheumatic mitral (valve) insufficiency: Secondary | ICD-10-CM

## 2023-02-07 DIAGNOSIS — Z515 Encounter for palliative care: Secondary | ICD-10-CM

## 2023-02-07 DIAGNOSIS — J9621 Acute and chronic respiratory failure with hypoxia: Secondary | ICD-10-CM

## 2023-02-07 DIAGNOSIS — Z7984 Long term (current) use of oral hypoglycemic drugs: Secondary | ICD-10-CM

## 2023-02-07 DIAGNOSIS — Z952 Presence of prosthetic heart valve: Secondary | ICD-10-CM | POA: Diagnosis not present

## 2023-02-07 DIAGNOSIS — I2510 Atherosclerotic heart disease of native coronary artery without angina pectoris: Secondary | ICD-10-CM | POA: Diagnosis not present

## 2023-02-07 DIAGNOSIS — E1122 Type 2 diabetes mellitus with diabetic chronic kidney disease: Secondary | ICD-10-CM

## 2023-02-07 DIAGNOSIS — J9601 Acute respiratory failure with hypoxia: Secondary | ICD-10-CM | POA: Diagnosis not present

## 2023-02-07 DIAGNOSIS — Z7189 Other specified counseling: Secondary | ICD-10-CM

## 2023-02-07 DIAGNOSIS — I5023 Acute on chronic systolic (congestive) heart failure: Secondary | ICD-10-CM | POA: Diagnosis not present

## 2023-02-07 LAB — TROPONIN I (HIGH SENSITIVITY)
Troponin I (High Sensitivity): 135 ng/L (ref <18)
Troponin I (High Sensitivity): 131 ng/L (ref <18)

## 2023-02-07 LAB — BASIC METABOLIC PANEL
Anion gap: 15 (ref 5–15)
BUN: 60 mg/dL — ABNORMAL HIGH (ref 8–23)
CO2: 27 mmol/L (ref 22–32)
Calcium: 8.8 mg/dL — ABNORMAL LOW (ref 8.9–10.3)
Chloride: 88 mmol/L — ABNORMAL LOW (ref 98–111)
Creatinine, Ser: 1.45 mg/dL — ABNORMAL HIGH (ref 0.44–1.00)
GFR, Estimated: 36 mL/min — ABNORMAL LOW (ref >60)
Glucose, Bld: 141 mg/dL — ABNORMAL HIGH (ref 70–99)
Potassium: 3.8 mmol/L (ref 3.5–5.1)
Sodium: 130 mmol/L — ABNORMAL LOW (ref 135–145)

## 2023-02-07 LAB — CULTURE, BLOOD (ROUTINE X 2): Culture: NO GROWTH

## 2023-02-07 LAB — MAGNESIUM: Magnesium: 2.2 mg/dL (ref 1.7–2.4)

## 2023-02-07 MED ORDER — ENOXAPARIN SODIUM 40 MG/0.4ML IJ SOSY
40.0000 mg | PREFILLED_SYRINGE | INTRAMUSCULAR | Status: DC
  Administered 2023-02-07 – 2023-02-09 (×3): 40 mg via SUBCUTANEOUS
  Filled 2023-02-07 (×3): qty 0.4

## 2023-02-07 MED ORDER — NITROGLYCERIN 0.4 MG SL SUBL
0.4000 mg | SUBLINGUAL_TABLET | SUBLINGUAL | Status: DC | PRN
  Administered 2023-02-07: 0.4 mg via SUBLINGUAL

## 2023-02-07 MED ORDER — IPRATROPIUM-ALBUTEROL 0.5-2.5 (3) MG/3ML IN SOLN
3.0000 mL | Freq: Once | RESPIRATORY_TRACT | Status: AC
  Administered 2023-02-07: 3 mL via RESPIRATORY_TRACT
  Filled 2023-02-07: qty 3

## 2023-02-07 MED ORDER — MELATONIN 5 MG PO TABS
5.0000 mg | ORAL_TABLET | Freq: Every day | ORAL | Status: DC
  Administered 2023-02-07 – 2023-02-09 (×4): 5 mg via ORAL
  Filled 2023-02-07 (×4): qty 1

## 2023-02-07 MED ORDER — EMPAGLIFLOZIN 25 MG PO TABS
25.0000 mg | ORAL_TABLET | Freq: Every day | ORAL | Status: DC
  Administered 2023-02-07 – 2023-02-10 (×4): 25 mg via ORAL
  Filled 2023-02-07 (×4): qty 1

## 2023-02-07 MED ORDER — NITROGLYCERIN 0.4 MG SL SUBL
SUBLINGUAL_TABLET | SUBLINGUAL | Status: AC
  Administered 2023-02-07: 0.4 mg
  Filled 2023-02-07: qty 1

## 2023-02-07 MED ORDER — ONDANSETRON HCL 4 MG/2ML IJ SOLN
4.0000 mg | Freq: Four times a day (QID) | INTRAMUSCULAR | Status: DC | PRN
  Administered 2023-02-07: 4 mg via INTRAVENOUS
  Filled 2023-02-07: qty 2

## 2023-02-07 NOTE — Progress Notes (Signed)
Mobility Specialist Progress Note:    02/07/23 1034  Mobility  Activity Transferred from bed to chair  Level of Assistance Minimal assist, patient does 75% or more  Assistive Device Front wheel walker  Activity Response Tolerated well  Mobility Referral Yes  $Mobility charge 1 Mobility  Mobility Specialist Start Time (ACUTE ONLY) 0940  Mobility Specialist Stop Time (ACUTE ONLY) [id number]  Mobility Specialist Time Calculation (min) (ACUTE ONLY) 12 min   Pt received in bed, stated that she was uncomfortable and offered transfer to chair. Pt c/o SOB, otherwise no c/o. Pt mobilized on 3L/min HFNC. O2 remained WFL. Call bell at hand and chair alarm on.    Thompson Grayer Mobility Specialist  Please contact vis Secure Chat or  Rehab Office [phone]

## 2023-02-07 NOTE — Progress Notes (Signed)
Inpatient Rehab Coordinator Note:  I met with patient at bedside to discuss CIR recommendations and goals/expectations of CIR stay.  We reviewed 3 hrs/day of therapy, physician follow up, and average length of stay 2 weeks (dependent upon progress) with goals of supervision to mod I.  Pt lives alone, but could potentially arrange for care between friends/family/hired caregivers.  She agreed for me to speak to her son to answer any questions.  I will try to speak to him this afternoon and f/u later today versus tomorrow.   Estill Dooms, PT, DPT Admissions Coordinator [phone] 02/07/23  12:55 PM

## 2023-02-07 NOTE — Progress Notes (Signed)
Called to bedside at 1600: Patient was having 10/10 chest pain and nausea.  Subjective: Patient reports that 10 mins ago she developed 10/10 chest pain and described to have chest pressure that was not radiating anywhere. She also reported associated shortness of breath. She denied any lightheaded, diaphoresis, or vomiting. She noted that it could be related to anxiety.   Objective: Vitals: BP 133/84, HR 90, O2 sat  91%  General:  Patient looked in distress Cardio: Regular rate and rhythm Pulse: Coarse breath sounds thought with wheezing   Assessment: Biggest concern at this point is ACS. Obtained bedside ECG which did not show any concerns for acute ischemia and when compared to the previous ECG, there was not much change. Patient was given 2 nitroglycerin 5 minutes apart and improved her chest pain. Likely had an anginal event. Anxiety could have played a role in this as well. Would like to obtain troponin to evaluated.  -Follow up troponin  -Nitro PRN -Obtain EKG if happens again.

## 2023-02-07 NOTE — Progress Notes (Signed)
Physical Therapy Treatment Patient Details Name: Angela Lucero MRN: [id number] DOB: 23-Aug-1941 Today's Date: 02/07/2023   History of Present Illness 81yo woman who presented 02/04/23 with acute progressive shortness of breath associated with acute sharp chest pain.+acute CHF, COPD exacerbation; required HFNC O2; PMH COPD, tobacco use, HFrEF (EF 30-35% in 11/2021), DM, CAD, CVA, HTN    PT Comments    Pt making steady progress and able to amb very short distance in room. Continue to feel patient will benefit from intensive inpatient follow up therapy, >3 hours/day    Recommendations for follow up therapy are one component of a multi-disciplinary discharge planning process, led by the attending physician.  Recommendations may be updated based on patient status, additional functional criteria and insurance authorization.  Follow Up Recommendations       Assistance Recommended at Discharge Set up Supervision/Assistance  Patient can return home with the following A little help with walking and/or transfers;A little help with bathing/dressing/bathroom;Assistance with cooking/housework;Assist for transportation;Help with stairs or ramp for entrance   Equipment Recommendations  Other (comment) (To be determined)    Recommendations for Other Services       Precautions / Restrictions Precautions Precautions: Fall Precaution Comments: watch sats, bone on bone L knee Restrictions Weight Bearing Restrictions: No     Mobility  Bed Mobility Overal bed mobility: Needs Assistance Bed Mobility: Supine to Sit     Supine to sit: Min assist     General bed mobility comments: Assist to elevate trunk into sitting    Transfers Overall transfer level: Needs assistance Equipment used: Rolling walker (2 wheels) Transfers: Sit to/from Stand, Bed to chair/wheelchair/BSC Sit to Stand: Min guard   Step pivot transfers: Min guard       General transfer comment: Verbal cues for hand placement.  Assist for safety    Ambulation/Gait Ambulation/Gait assistance: Min assist Gait Distance (Feet): 5 Feet (4' x 1, 5' x 1) Assistive device: Rolling walker (2 wheels) Gait Pattern/deviations: Step-through pattern, Decreased step length - right, Decreased step length - left, Shuffle, Trunk flexed Gait velocity: decr Gait velocity interpretation: <1.31 ft/sec, indicative of household ambulator   General Gait Details: Assist for support and balance. Pt leans over and props forearms on walker to rest. Anxiety playing a role in limiting distances   Stairs             Wheelchair Mobility    Modified Rankin (Stroke Patients Only)       Balance Overall balance assessment: Needs assistance Sitting-balance support: No upper extremity supported Sitting balance-Leahy Scale: Good     Standing balance support: Bilateral upper extremity supported, During functional activity, Reliant on assistive device for balance Standing balance-Leahy Scale: Poor Standing balance comment: walker and min guard for static standing                            Cognition Arousal/Alertness: Awake/alert Behavior During Therapy: WFL for tasks assessed/performed Overall Cognitive Status: Within Functional Limits for tasks assessed                                          Exercises      General Comments General comments (skin integrity, edema, etc.): VSS on 3L O2      Pertinent Vitals/Pain Pain Assessment Pain Assessment: Faces Faces Pain Scale: No hurt    Home Living  Prior Function            PT Goals (current goals can now be found in the care plan section) Progress towards PT goals: Progressing toward goals    Frequency    Min 1X/week      PT Plan Current plan remains appropriate    Co-evaluation              AM-PAC PT "6 Clicks" Mobility   Outcome Measure  Help needed turning from your back to your side  while in a flat bed without using bedrails?: A Little Help needed moving from lying on your back to sitting on the side of a flat bed without using bedrails?: A Little Help needed moving to and from a bed to a chair (including a wheelchair)?: A Little Help needed standing up from a chair using your arms (e.g., wheelchair or bedside chair)?: A Little Help needed to walk in hospital room?: Total Help needed climbing 3-5 steps with a railing? : Total 6 Click Score: 14    End of Session Equipment Utilized During Treatment: Gait belt;Oxygen Activity Tolerance: Patient limited by fatigue Patient left: in bed;with call bell/phone within reach;with nursing/sitter in room (sitting EOB with nurse present) Nurse Communication: Mobility status PT Visit Diagnosis: Other abnormalities of gait and mobility (R26.89);Muscle weakness (generalized) (M62.81)     Time: 1410-1433 PT Time Calculation (min) (ACUTE ONLY): 23 min  Charges:  $Gait Training: 23-37 mins                     [facility] PT Acute Rehabilitation Services Office [phone]    Angelina Ok [facility] 02/07/2023, 4:52 PM

## 2023-02-07 NOTE — Evaluation (Signed)
Occupational Therapy Evaluation Patient Details Name: Angela Lucero MRN: [id number] DOB: April 03, 1941 Today's Date: 02/07/2023   History of Present Illness 82yo woman who presented 02/04/23 with acute progressive shortness of breath associated with acute sharp chest pain.+acute CHF, COPD exacerbation; required HFNC O2; PMH COPD, tobacco use, HFrEF (EF 30-35% in 11/2021), DM, CAD, CVA, HTN   Clinical Impression   Pt lives alone, walks without AD in the house and with a cane outside. Her neighbor helps with getting groceries. Pt is typically independent in ADLs and IADLs. Pt presents with decreased activity tolerance, shortness of breath with minimal exertion, generalized weakness, back pain and poor standing balance. Pt transferred x 2 with RW and min guard assist, cues for hand placement. She needs set up to min assist for ADLs. Pt has potential to return to modified independence with intensive rehab. Patient will benefit from intensive inpatient follow up therapy, >3 hours/day.      Recommendations for follow up therapy are one component of a multi-disciplinary discharge planning process, led by the attending physician.  Recommendations may be updated based on patient status, additional functional criteria and insurance authorization.   Assistance Recommended at Discharge Frequent or constant Supervision/Assistance  Patient can return home with the following A little help with walking and/or transfers;A little help with bathing/dressing/bathroom    Functional Status Assessment  Patient has had a recent decline in their functional status and demonstrates the ability to make significant improvements in function in a reasonable and predictable amount of time.  Equipment Recommendations       Recommendations for Other Services       Precautions / Restrictions Precautions Precautions: Fall Precaution Comments: watch sats, bone on bone L knee Restrictions Weight Bearing Restrictions: No       Mobility Bed Mobility Overal bed mobility: Needs Assistance Bed Mobility: Sit to Supine       Sit to supine: Min assist   General bed mobility comments: min assist for LEs back into bed    Transfers Overall transfer level: Needs assistance Equipment used: Rolling walker (2 wheels) Transfers: Sit to/from Stand, Bed to chair/wheelchair/BSC Sit to Stand: Min guard     Step pivot transfers: Min guard     General transfer comment: x 2, cues for hand placement, pt with tendency to lean forearms on RW      Balance Overall balance assessment: Needs assistance   Sitting balance-Leahy Scale: Good       Standing balance-Leahy Scale: Poor                             ADL either performed or assessed with clinical judgement   ADL Overall ADL's : Needs assistance/impaired Eating/Feeding: Independent;Sitting   Grooming: Set up;Sitting   Upper Body Bathing: Set up;Sitting   Lower Body Bathing: Minimal assistance;Sit to/from stand   Upper Body Dressing : Set up;Sitting   Lower Body Dressing: Minimal assistance;Sit to/from stand   Toilet Transfer: Min guard;Stand-pivot;Rolling walker (2 wheels)   Toileting- Clothing Manipulation and Hygiene: Minimal assistance;Sit to/from stand               Vision Ability to See in Adequate Light: 0 Adequate Patient Visual Report: No change from baseline       Perception     Praxis      Pertinent Vitals/Pain Pain Assessment Pain Assessment: Faces Faces Pain Scale: Hurts even more Pain Location: back Pain Descriptors / Indicators: Aching Pain Intervention(s): Repositioned,  Monitored during session, Limited activity within patient's tolerance     Hand Dominance Right   Extremity/Trunk Assessment Upper Extremity Assessment Upper Extremity Assessment: Overall WFL for tasks assessed   Lower Extremity Assessment Lower Extremity Assessment: Defer to PT evaluation   Cervical / Trunk Assessment Cervical /  Trunk Assessment: Other exceptions Cervical / Trunk Exceptions: overweight, chronic back pain   Communication Communication Communication: No difficulties   Cognition Arousal/Alertness: Awake/alert Behavior During Therapy: WFL for tasks assessed/performed Overall Cognitive Status: Within Functional Limits for tasks assessed                                 General Comments: pt hesitant to seek medical attention as her condition became worse and she was needing to sleep in the couch to elevate her head     General Comments       Exercises     Shoulder Instructions      Home Living Family/patient expects to be discharged to:: Private residence Living Arrangements: Alone Available Help at Discharge: Neighbor;Available PRN/intermittently;Family Type of Home: Mobile home Home Access: Stairs to enter Entrance Stairs-Number of Steps: 3 Entrance Stairs-Rails: Left Home Layout: One level     Bathroom Shower/Tub: Chief Strategy Officer: Standard     Home Equipment: Cane - single Librarian, academic (2 wheels)          Prior Functioning/Environment Prior Level of Function : Independent/Modified Independent             Mobility Comments: no device in home; cane in community ADLs Comments: neighbor gets her groceries; has not driven for a year (no car), sits to shower, fearful of falling in tub        OT Problem List: Decreased strength;Decreased activity tolerance;Impaired balance (sitting and/or standing);Cardiopulmonary status limiting activity;Pain;Obesity      OT Treatment/Interventions: Self-care/ADL training;DME and/or AE instruction;Therapeutic activities;Patient/family education;Balance training;Energy conservation    OT Goals(Current goals can be found in the care plan section) Acute Rehab OT Goals OT Goal Formulation: With patient Time For Goal Achievement: 02/21/23 Potential to Achieve Goals: Good ADL Goals Pt Will Perform  Grooming: with modified independence;standing Pt Will Perform Lower Body Bathing: with modified independence;sit to/from stand Pt Will Perform Lower Body Dressing: with modified independence;sit to/from stand Pt Will Transfer to Toilet: with modified independence;ambulating;regular height toilet Pt Will Perform Toileting - Clothing Manipulation and hygiene: with modified independence;sit to/from stand Additional ADL Goal #1: Pt will utilize energy conservation and pursed lip breathing techniques during ADLs and mobility.  OT Frequency: Min 1X/week    Co-evaluation              AM-PAC OT "6 Clicks" Daily Activity     Outcome Measure Help from another person eating meals?: None Help from another person taking care of personal grooming?: A Little Help from another person toileting, which includes using toliet, bedpan, or urinal?: A Little Help from another person bathing (including washing, rinsing, drying)?: A Little Help from another person to put on and taking off regular upper body clothing?: A Little Help from another person to put on and taking off regular lower body clothing?: A Little 6 Click Score: 19   End of Session Equipment Utilized During Treatment: Rolling walker (2 wheels);Gait belt;Oxygen (3L)  Activity Tolerance: Patient limited by pain;Patient limited by fatigue Patient left: in bed;with call bell/phone within reach;with bed alarm set;with family/visitor present  OT Visit Diagnosis:  Unsteadiness on feet (R26.81);Other abnormalities of gait and mobility (R26.89);Pain;Muscle weakness (generalized) (M62.81) (decreased activity tolerance)                Time: 1610-9604 OT Time Calculation (min): 51 min Charges:  OT General Charges $OT Visit: 1 Visit OT Evaluation $OT Eval Moderate Complexity: 1 Mod OT Treatments $Self Care/Home Management : 23-37 mins  Berna Spare, OTR/L Acute Rehabilitation Services Office: [phone]   Evern Bio 02/07/2023, 11:54  AM

## 2023-02-07 NOTE — Progress Notes (Signed)
HD#2 Subjective:   Summary: Angela Lucero is a 82 y.o. female with a past medical history of HFrEF, hypertension, type 2 diabetes who presents to the emergency department with concerns of shortness of breath.  Patient admitted for further evaluation and management of acute hypoxic respiratory failure.   Overnight Events: No overnight events  Patient evaluated bedside this morning  Patient states her shortness of breath is improving.  She does report some intermittent pressure chest pain.  She denies any other concerns this morning.  Patient states her son was at bedside yesterday, and she enjoyed that.  I introduced the concept of palliative medicine to the patient, which she is very receptive to.  Spoke to son at bedside as well, who state he is very receptive to this as well.  Objective:  Vital signs in last 24 hours: Vitals:   02/07/23 0743 02/07/23 0836 02/07/23 1207 02/07/23 1332  BP: 120/67  131/73   Pulse: 85  91   Resp: 20  20   Temp: 97.7 F (36.5 C)  97.7 F (36.5 C)   TempSrc: Oral  Oral   SpO2: 96% 97% 94% 93%  Weight:      Height:       Supplemental O2: HFNC SpO2: 93 % O2 Flow Rate (L/min): 3 L/min FiO2 (%): 60 %   Physical Exam:  Constitutional: Resting in bed, no acute distress, nasal cannula in place HENT: normocephalic atraumatic Cardiovascular: regular rate and rhythm, no m/r/g Pulmonary/Chest: Coarse breath sounds heard throughout.  Wheezing appreciated.  Bibasilar crackles.  Extremities: 1+ pitting edema bilaterally  Filed Weights   02/05/23 0531 02/06/23 0400 02/07/23 0437  Weight: 83.5 kg 84.8 kg 89.1 kg     Intake/Output Summary (Last 24 hours) at 02/07/2023 1347 Last data filed at 02/07/2023 1332 Gross per 24 hour  Intake 1068.3 ml  Output 2400 ml  Net -1331.7 ml   Net IO Since Admission: -3,181.7 mL [02/07/23 1347]  Pertinent Labs:    Latest Ref Rng & Units 02/06/2023    1:13 AM 02/05/2023    4:29 AM 02/04/2023   11:52 AM  CBC  WBC 4.0 -  10.5 K/uL 10.7  11.0  8.9   Hemoglobin 12.0 - 15.0 g/dL 16.1  09.6  04.5   Hematocrit 36.0 - 46.0 % 36.7  37.7  40.0   Platelets 150 - 400 K/uL 316  312  340        Latest Ref Rng & Units 02/07/2023    1:00 AM 02/06/2023    1:13 AM 02/05/2023    7:51 PM  CMP  Glucose 70 - 99 mg/dL 409  811    BUN 8 - 23 mg/dL 60  58    Creatinine 9.14 - 1.00 mg/dL 7.82  9.56    Sodium 213 - 145 mmol/L 130  126    Potassium 3.5 - 5.1 mmol/L 3.8  4.1  4.5   Chloride 98 - 111 mmol/L 88  90    CO2 22 - 32 mmol/L 27  24    Calcium 8.9 - 10.3 mg/dL 8.8  8.4      Imaging: No results found.  Assessment/Plan:   Principal Problem:   Acute hypoxic respiratory failure (HCC) Active Problems:   Type 2 diabetes mellitus with complication, without long-term current use of insulin (HCC)   Congestive heart failure (CHF) (HCC)   Smoking   Dyslipidemia   Hyponatremia   Elevated troponin   Acute on chronic hypoxic respiratory failure (  HCC)   COPD exacerbation (HCC)   Acute on chronic HFrEF (heart failure with reduced ejection fraction) (HCC)   AKI (acute kidney injury) (HCC)   Patient Summary: Angela Lucero is a 82 y.o. female with a past medical history of HFrEF, hypertension, type 2 diabetes who presents to the emergency department with concerns of shortness of breath.  Patient admitted for further evaluation and management of acute hypoxic respiratory failure.   #Acute hypoxic respiratory failure #Acute on chronic HFrEF (Ischemic cardiomyopathy) #CAD status post stenting #Severe mitral regurgitation Patient evaluated bedside this morning.  Patient is now down to 3 L nasal cannula.  Patient is not on any home O2, so patient will still need to continue to diurese and wean oxygen.  Patient did have talk with palliative care, and does not want any heroic measures, but would like to be treated still.  Patient is a DNR/DNI.  Echo showing global hypokinesis and severely decreased heart function.  EF 20 to 25%.  Plan  will be to continue diuresis.  Had conversation with her as well as her son about the severity of her disease.  They both understand that her heart is not doing well.  They state that they have come to terms with this.  They agree to palliative care consult. -Continue home metoprolol 25 mg daily -Continue Imdur 60 mg daily -Continue hydralazine 10 mg 3 times daily  -Cardiology following, appreciate recs -Will hold Entresto -Can resume Jardiance today -Strict I's and O's, daily weights  -Palliative care following  #COPD exacerbation Today will be day 4 of of prednisone.  Patient will need 1 more day of prednisone tomorrow. 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  Creatinine at 1.45 this morning.  Creatinine back to baseline.  Hyponatremia improving as well with sodium up to 130.  Initial sodium was 122.  Patient is down 3 L during hospitalization.  Patient continues to improve.  Did have evaluation for adrenal insufficiency with a.m. cortisol which did not show any evidence of adrenal insufficiency.   -Monitor BMP -Continue diuresing -Will hold home Entresto at this time  #Type 2 diabetes mellitus A1c 6.0.  Glucose measuring well during hospitalization. -Resume home Jardiance 25 mg daily -Continue to monitor BMP  Diet: Heart Healthy IVF: None,None VTE: Enoxaparin Code: DNR/DNI PT/OT recs: Pending, none.  Dispo: Anticipated discharge to Home in 2 days pending medical improvement.   Modena Slater DO Internal Medicine Resident PGY-1 [phone] Please contact the on call pager after 5 pm and on weekends at [phone].

## 2023-02-07 NOTE — TOC Initial Note (Signed)
Transition of Care [facility]) - Initial/Assessment Note    Patient Details  Name: Angela Lucero MRN: [id number] Date of Birth: 1941/07/29  [facility] CM/SW Contact:    Angela Lucero Phone Number: 02/07/2023, 2:15 PM  Clinical Narrative:                 From home alone, Has PCP, Angela Lucero, has insurance on file, Medicare, [place], he has a walker and a cane at home but does not use them.  He does not have any HH services in place at this time, his support system is his son Angela Lucero.  [facility] [place] [facility] [address] And also thru [place] by mail.  She is interested in what Palliative has to offer , Palliative has been consulted.     Expected Discharge Plan: IP Rehab Facility Barriers to Discharge: Continued Medical Work up   Patient Goals and CMS Choice Patient states their goals for this hospitalization and ongoing recovery are:: return home   Choice offered to / list presented to : NA      Expected Discharge Plan and Services In-house Referral: NA Discharge Planning Services: CM Consult Post Acute Care Choice: NA Living arrangements for the past 2 months: Single Family Home                 DME Arranged: N/A DME Agency: NA       HH Arranged: NA          Prior Living Arrangements/Services Living arrangements for the past 2 months: Single Family Home Lives with:: Self Patient language and need for interpreter reviewed:: Yes Do you feel safe going back to the place where you live?: Yes      Need for Family Participation in Patient Care: Yes (Comment) Care giver support system in place?: Yes (comment)   Criminal Activity/Legal Involvement Pertinent to Current Situation/Hospitalization: No - Comment as needed  Activities of Daily Living Home Assistive Devices/Equipment: Walker (specify type), Cane (specify quad or straight) (last two days using walker, pt uses cane outside on a daily basis) ADL Screening (condition at time  of admission) Patient's cognitive ability adequate to safely complete daily activities?: Yes Is the patient deaf or have difficulty hearing?: No Does the patient have difficulty seeing, even when wearing glasses/contacts?: No Does the patient have difficulty concentrating, remembering, or making decisions?: No Patient able to express need for assistance with ADLs?: Yes Does the patient have difficulty dressing or bathing?: No Independently performs ADLs?: Yes (appropriate for developmental age) Does the patient have difficulty walking or climbing stairs?: Yes Weakness of Legs: Left Weakness of Arms/Hands: None  Permission Sought/Granted                  Emotional Assessment Appearance:: Appears stated age Attitude/Demeanor/Rapport: Engaged Affect (typically observed): Appropriate Orientation: : Oriented to Self, Oriented to Place, Oriented to  Time, Oriented to Situation Alcohol / Substance Use: Not Applicable Psych Involvement: No (comment)  Admission diagnosis:  Hyponatremia [E87.1] Hypoglycemia [E16.2] Elevated troponin [R79.89] COPD exacerbation (HCC) [J44.1] AKI (acute kidney injury) (HCC) [N17.9] Acute hypoxic respiratory failure (HCC) [J96.01] Acute on chronic hypoxic respiratory failure (HCC) [J96.21] Patient Active Problem List   Diagnosis Date Noted   AKI (acute kidney injury) (HCC) 02/06/2023   Acute on chronic hypoxic respiratory failure (HCC) 02/05/2023   COPD exacerbation (HCC) 02/05/2023   Acute on chronic HFrEF (heart failure with reduced ejection fraction) (HCC) 02/05/2023  Acute hypoxic respiratory failure (HCC) 02/04/2023   Hyponatremia 02/04/2023   Elevated troponin 02/04/2023   Cardiomyopathy (HCC) 01/01/2022   Chronic edema 12/18/2021   Non-ST elevation (NSTEMI) myocardial infarction (HCC) 12/14/2021   Acute systolic heart failure (HCC) 12/14/2021   Coronary artery disease PTCA and stenting in 1997 and 2005 in [place] 12/10/2021   Congestive  heart failure (CHF) (HCC) 12/10/2021   Smoking 12/10/2021   Dyslipidemia 12/10/2021   Preop cardiovascular exam 12/10/2021   Flat foot 02/01/2018   Type 2 diabetes mellitus with complication, without long-term current use of insulin (HCC) 02/01/2018   Arthritis of both feet 02/01/2018   PCP:  [name], MD [facility]acy[facility]   [phone] [place] [place] - [address] [place] [address] Phone: [phone] Fax: [phone]  [facility] - [place], [place] - [address] [facility] [address] [place] [address] [place] [address] Phone: [phone] Fax: [phone]  [facility] [address] [place] [address] Phone: [phone] Fax: [phone]     Social Determinants of Health (SDOH) Social History: SDOH Screenings   Food Insecurity: No Food Insecurity (02/04/2023)  Housing: Low Risk  (02/04/2023)  Transportation Needs: No Transportation Needs (02/04/2023)  Utilities: Not At Risk (02/04/2023)  Tobacco Use: High Risk (02/04/2023)   SDOH Interventions:     Readmission Risk Interventions     No data to display

## 2023-02-07 NOTE — Progress Notes (Addendum)
1840 Called phlebotomy to enquire when troponin lab collection can be completed. Unable to make contact. Then called the main lab.  The main lab said that they only have the lab same phlebotomy number and are unable to provide assistance with the request.  Will continue to call until contact with phlebotomy is made or they arrive on the unit.  lab said sample was received at 1845.

## 2023-02-07 NOTE — Progress Notes (Signed)
   Heart Failure Stewardship Pharmacist Progress Note   PCP: [name], MD PCP-Cardiologist: Gypsy Balsam, MD    HPI:  82 yo F with PMH of CHF, CAD, COPD, T2DM, HLD and HTN.   Admitted in 11/2021 with NSTEMI and acute systolic HF. Cath showed severe single vessel CAD with chronic total occlusion of mid LAD with R>L and L>L collaterals. Medical therapy recommended. ECHO showed LVEF 30-35%, RV normal, mild to moderate MR. cMRI done and showed subendocardial late gadolinium enhancement consistent with prior infarct in LAD. LGE >50% suggesting nonviability in this territory. LVEF 24%, RV normal, moderate MR.  Presented to the ED on 6/7 with shortness of breath, orthopnea, and chest discomfort. Has chronic LE edema but this was overall unchanged. BNP elevated, trop 147. CXR with bibasilar opacities representing atelectasis or infection. ECHO 6/8 showed LVEF reduced to 20-25%, global hypokinesis, RV normal, moderately elevated PA pressure, severe MR, RAP 15 mmHg.   Possible CIR at discharge.   Current HF Medications: Diuretic: furosemide 120 mg IV BID Beta Blocker: metoprolol XL 25 mg daily Other: hydralazine 10 mg TID + Imdur 60 mg daily  Prior to admission HF Medications: Diuretic: furosemide 60 mg daily Beta blocker: metoprolol XL 25 mg daily ACE/ARB/ARNI: Entresto 24/26 mg BID SGLT2i: Jardiance 25 mg daily  Pertinent Lab Values: Serum creatinine 1.45, BUN 60, Potassium 3.8, Sodium 130, BNP 3951.4, Magnesium 2.2, A1c 6.0   Vital Signs: Weight: 196 lbs - bed weight (admission weight: 182 lbs) Blood pressure: 120/60s  Heart rate: 80-90s  I/O: -2.1L yesterday; net -2.8L  Medication Assistance / Insurance Benefits Check: Does the patient have prescription insurance?  Yes Type of insurance plan: [employer]  Outpatient Pharmacy:  [facility] the [facility] willing to use [facility] TOC pharmacy at discharge? Yes Is the patient willing to transition their  outpatient pharmacy to utilize a [facility] outpatient pharmacy?   Pending    Assessment: 1. Acute on chronic systolic CHF (LVEF 20-25%), due to ICM. NYHA class III symptoms. - Continue furosemide 120 mg IV BID. Strict I/Os and daily weights. Encourage standing weights for better accuracy. Keep K>4 and Mg>2. - Continue metoprolol XL 25 mg daily - Holding [place] and [place] with AKI. Creatinine improving, almost back to baseline.  - Continue hydralazine 10 mg TID and Imdur 60 mg daily   Plan: 1) Medication changes recommended at this time: - Continue IV diuresis  2) Patient assistance: - Has insurance through the [place]  3)  Education  - To be completed prior to discharge  Sharen Hones, PharmD, BCPS Heart Failure Engineer, building services Phone [phone]

## 2023-02-07 NOTE — Progress Notes (Addendum)
Patient complained of chest pain 10/10.  Dr, Allena Katz and Dr. Ned Card came to bedside. Patient received 2 ntg tablets.  Chest pain has subsided. Labs and neb tx ordered.  EkG performed.

## 2023-02-07 NOTE — Progress Notes (Signed)
Progress Note  Patient Name: Lynnsie Orner Date of Encounter: 02/07/2023  Primary Cardiologist:   Gypsy Balsam, MD   Subjective   Breathing is better but not at baseline  Inpatient Medications    Scheduled Meds:  aspirin EC  81 mg Oral Daily   atorvastatin  80 mg Oral Daily   enoxaparin (LOVENOX) injection  30 mg Subcutaneous Q24H   hydrALAZINE  10 mg Oral Q8H   ipratropium-albuterol  3 mL Nebulization TID   isosorbide mononitrate  60 mg Oral Daily   melatonin  5 mg Oral QHS   metoprolol succinate  25 mg Oral Daily   predniSONE  40 mg Oral Q breakfast   umeclidinium bromide  1 puff Inhalation Daily   Continuous Infusions:  furosemide 120 mg (02/07/23 0913)   PRN Meds: acetaminophen, albuterol, mouth rinse, polyethylene glycol, simethicone   Vital Signs    Vitals:   02/06/23 2349 02/07/23 0437 02/07/23 0743 02/07/23 0836  BP: 121/67 (!) 119/57 120/67   Pulse: 89 83 85   Resp: 19 19 20    Temp: 97.7 F (36.5 C) (!) 97.3 F (36.3 C) 97.7 F (36.5 C)   TempSrc: Oral Axillary Oral   SpO2: 96% 94% 96% 97%  Weight:  89.1 kg    Height:        Intake/Output Summary (Last 24 hours) at 02/07/2023 1159 Last data filed at 02/07/2023 0824 Gross per 24 hour  Intake 948.3 ml  Output 1800 ml  Net -851.7 ml   Filed Weights   02/05/23 0531 02/06/23 0400 02/07/23 0437  Weight: 83.5 kg 84.8 kg 89.1 kg    Telemetry    NSR, PVCs - Personally Reviewed  ECG    NA - Personally Reviewed  Physical Exam   GEN: No acute distress.   Neck: No  JVD Cardiac: RRR, 2/6 apical systolic murmur, no diastolic murmurs, rubs, or gallops.  Respiratory:     Diffuse scattered wheezing.  GI: Soft, nontender, non-distended  MS: No  edema; No deformity. Neuro:  Nonfocal  Psych: Normal affect   Labs    Chemistry Recent Labs  Lab 02/04/23 1152 02/04/23 1746 02/05/23 1719 02/05/23 1951 02/06/23 0113 02/07/23 0100  NA 122*   < > 127*  --  126* 130*  K 5.0   < > 5.8* 4.5 4.1  3.8  CL 88*   < > 89*  --  90* 88*  CO2 21*   < > 22  --  24 27  GLUCOSE 43*   < > 150*  --  151* 141*  BUN 45*   < > 56*  --  58* 60*  CREATININE 1.89*   < > 1.68*  --  1.55* 1.45*  CALCIUM 9.2   < > 8.7*  --  8.4* 8.8*  PROT 6.8  --   --   --   --   --   ALBUMIN 3.3*  --   --   --   --   --   AST 56*  --   --   --   --   --   ALT 35  --   --   --   --   --   ALKPHOS 33*  --   --   --   --   --   BILITOT 0.8  --   --   --   --   --   GFRNONAA 27*   < > 31*  --  34* 36*  ANIONGAP 13   < > 16*  --  12 15   < > = values in this interval not displayed.     Hematology Recent Labs  Lab 02/04/23 1152 02/05/23 0429 02/06/23 0113  WBC 8.9 11.0* 10.7*  RBC 4.26 4.22 4.07  HGB 13.2 12.8 12.3  HCT 40.0 37.7 36.7  MCV 93.9 89.3 90.2  MCH 31.0 30.3 30.2  MCHC 33.0 34.0 33.5  RDW 14.6 14.6 14.6  PLT 340 312 316    Cardiac EnzymesNo results for input(s): "TROPONINI" in the last 168 hours. No results for input(s): "TROPIPOC" in the last 168 hours.   BNP Recent Labs  Lab 02/04/23 1152  BNP 3,951.4*     DDimer No results for input(s): "DDIMER" in the last 168 hours.   Radiology    ECHOCARDIOGRAM COMPLETE  Result Date: 02/05/2023    ECHOCARDIOGRAM REPORT   Patient Name:   MYRIAH BUSHAW  Date of Exam: 02/05/2023 Medical Rec #:  [id number]  Height:       66.0 in Accession #:    [id number] Weight:       184.1 lb Date of Birth:  [birth year]  BSA:          1.931 m Patient Age:    81 years   BP:           127/77 mmHg Patient Gender: F          HR:           93 bpm. [facility] Procedure: 2D Echo, Cardiac Doppler and Color Doppler Indications:    Congestive Heart Failure I50.9  History:        Patient has prior history of Echocardiogram examinations, most                 recent 12/15/2021. CHF and Cardiomyopathy, CAD and Previous                 Myocardial Infarction, COPD; Risk Factors:Hypertension, Diabetes                 and Dyslipidemia.  Sonographer:    Celesta Gentile RCS Referring  Phys: [id number] JULIE MACHEN IMPRESSIONS  1. Left ventricular ejection fraction, by estimation, is 20 to 25%. The left ventricle has severely decreased function. The left ventricle demonstrates global hypokinesis. The left ventricular internal cavity size was mildly dilated. Indeterminate diastolic filling due to E-A fusion.  2. Right ventricular systolic function is normal. The right ventricular size is mildly enlarged. There is moderately elevated pulmonary artery systolic pressure. The estimated right ventricular systolic pressure is 49.1 mmHg.  3. Left atrial size was severely dilated.  4. Right atrial size was mildly dilated.  5. The mitral valve is abnormal. Severe mitral valve regurgitation. No evidence of mitral stenosis.  6. The aortic valve is tricuspid. Aortic valve regurgitation is not visualized. Aortic valve sclerosis/calcification is present, without any evidence of aortic stenosis.  7. The inferior vena cava is dilated in size with <50% respiratory variability, suggesting right atrial pressure of 15 mmHg. Comparison(s): Changes from prior study are noted. The left ventricular function is significantly worse. Severe MR is now present. FINDINGS  Left Ventricle: Left ventricular ejection fraction, by estimation, is 20 to 25%. The left ventricle has severely decreased function. The left ventricle demonstrates global hypokinesis. Definity contrast agent was given IV to delineate the left ventricular endocardial borders. The left ventricular internal cavity size was mildly dilated. There is no  left ventricular hypertrophy. Indeterminate diastolic filling due to E-A fusion. Right Ventricle: The right ventricular size is mildly enlarged. No increase in right ventricular wall thickness. Right ventricular systolic function is normal. There is moderately elevated pulmonary artery systolic pressure. The tricuspid regurgitant velocity is 2.92 m/s, and with an assumed right atrial pressure of 15 mmHg, the estimated  right ventricular systolic pressure is 49.1 mmHg. Left Atrium: Left atrial size was severely dilated. Right Atrium: Right atrial size was mildly dilated. Pericardium: There is no evidence of pericardial effusion. Mitral Valve: The mitral valve is abnormal. Severe mitral valve regurgitation. No evidence of mitral valve stenosis. MV peak gradient, 8.1 mmHg. The mean mitral valve gradient is 3.0 mmHg. Tricuspid Valve: The tricuspid valve is grossly normal. Tricuspid valve regurgitation is mild . No evidence of tricuspid stenosis. Aortic Valve: The aortic valve is tricuspid. Aortic valve regurgitation is not visualized. Aortic valve sclerosis/calcification is present, without any evidence of aortic stenosis. Aortic valve mean gradient measures 2.0 mmHg. Aortic valve peak gradient measures 3.2 mmHg. Aortic valve area, by VTI measures 3.09 cm. Pulmonic Valve: The pulmonic valve was grossly normal. Pulmonic valve regurgitation is trivial. No evidence of pulmonic stenosis. Aorta: The aortic root is normal in size and structure. Venous: The inferior vena cava is dilated in size with less than 50% respiratory variability, suggesting right atrial pressure of 15 mmHg. IAS/Shunts: The atrial septum is grossly normal.  LEFT VENTRICLE PLAX 2D LVIDd:         6.10 cm LVIDs:         5.10 cm LV PW:         0.90 cm LV IVS:        0.80 cm LVOT diam:     1.90 cm LV SV:         48 LV SV Index:   25 LVOT Area:     2.84 cm  LV Volumes (MOD) LV vol d, MOD A2C: 148.0 ml LV vol d, MOD A4C: 218.0 ml LV vol s, MOD A2C: 107.0 ml LV vol s, MOD A4C: 150.0 ml LV SV MOD A2C:     41.0 ml LV SV MOD A4C:     218.0 ml LV SV MOD BP:      52.8 ml RIGHT VENTRICLE RV S prime:     10.80 cm/s TAPSE (M-mode): 2.4 cm LEFT ATRIUM            Index        RIGHT ATRIUM           Index LA diam:      4.80 cm  2.49 cm/m   RA Area:     24.10 cm LA Vol (A2C): 113.0 ml 58.53 ml/m  RA Volume:   84.80 ml  43.93 ml/m LA Vol (A4C): 105.0 ml 54.39 ml/m  AORTIC VALVE AV  Area (Vmax):    2.96 cm AV Area (Vmean):   2.61 cm AV Area (VTI):     3.09 cm AV Vmax:           89.40 cm/s AV Vmean:          68.800 cm/s AV VTI:            0.157 m AV Peak Grad:      3.2 mmHg AV Mean Grad:      2.0 mmHg LVOT Vmax:         93.40 cm/s LVOT Vmean:        63.350 cm/s LVOT VTI:  0.171 m LVOT/AV VTI ratio: 1.09  AORTA Ao Root diam: 3.40 cm MITRAL VALVE                  TRICUSPID VALVE MV Area (PHT): 4.46 cm       TR Peak grad:   34.1 mmHg MV Area VTI:   1.53 cm       TR Vmax:        292.00 cm/s MV Peak grad:  8.1 mmHg MV Mean grad:  3.0 mmHg       SHUNTS MV Vmax:       1.42 m/s       Systemic VTI:  0.17 m MV Vmean:      74.3 cm/s      Systemic Diam: 1.90 cm MV Decel Time: 170 msec MR Peak grad:    94.5 mmHg MR Mean grad:    45.0 mmHg MR Vmax:         486.00 cm/s MR Vmean:        298.0 cm/s MR PISA:         5.09 cm MR PISA Eff ROA: 24 mm MR PISA Radius:  0.90 cm MV E velocity: 124.00 cm/s Lennie Odor MD Electronically signed by Lennie Odor MD Signature Date/Time: 02/05/2023/3:35:07 PM    Final     Cardiac Studies   Echo:  1. Left ventricular ejection fraction, by estimation, is 20 to 25%. The  left ventricle has severely decreased function. The left ventricle  demonstrates global hypokinesis. The left ventricular internal cavity size  was mildly dilated. Indeterminate  diastolic filling due to E-A fusion.   2. Right ventricular systolic function is normal. The right ventricular  size is mildly enlarged. There is moderately elevated pulmonary artery  systolic pressure. The estimated right ventricular systolic pressure is  49.1 mmHg.   3. Left atrial size was severely dilated.   4. Right atrial size was mildly dilated.   5. The mitral valve is abnormal. Severe mitral valve regurgitation. No  evidence of mitral stenosis.   6. The aortic valve is tricuspid. Aortic valve regurgitation is not  visualized. Aortic valve sclerosis/calcification is present, without any   evidence of aortic stenosis.   7. The inferior vena cava is dilated in size with <50% respiratory  variability, suggesting right atrial pressure of 15 mmHg.    Patient Profile     82 y.o. female with systolic heart failure (EF 24%), CAD, CTO of mid LAD with collaterals, diabetes, CKD IIIb admitted on 02/04/2023 for acute on chronic systolic heart failure.   Assessment & Plan     Acute on chronic systolic heart failure, EF 20-25%:   Net negative 3.1 liters.  Good UO yesterday.  I think current high dose diuretic are reasonable.   Breathing is better but not at baseline so continue current therapy again today.   Will continue to hold ARB/ARNI for now.    CAD/CTO of mid LAD (nonviable):    Medical management.  Elevated trop indicative of demand ischemia.     Hypervolemic hyponatremia:   Na low but slowly improving.  Continue to follow .      CKD stage IIIb:    Creat is slightly improved.       For questions or updates, please contact [facility] Please consult [email or web address] for contact info under Cardiology/STEMI.   Signed, Rollene Rotunda, MD  02/07/2023, 11:59 AM

## 2023-02-07 NOTE — Consult Note (Signed)
Palliative Care Consult Note                                  Date: 02/07/2023   Patient Name: Angela Lucero  DOB: 1940/10/23  MRN: [id number]  Age / Sex: 82 y.o., female  PCP: [name], MD Referring Physician: Earl Lagos, MD  Reason for Consultation: Establishing goals of care  HPI/Patient Profile: 82 y.o. female  with past medical history of HFrEF, COPD, CAD s/p stenting, CVA, type 2 diabetes, and HTN who presented to the ED on 02/04/2023 with shortness of breath.  She was admitted for further evaluation and management of acute hypoxic respiratory failure secondary to acute on chronic HFrEF.  Palliative Medicine was consulted for goals of care.  Past Medical History:  Diagnosis Date   Arthritis 2010   Lower back and neck   CAD (coronary artery disease)    COPD (chronic obstructive pulmonary disease) (HCC)    CVA (cerebral vascular accident) (HCC)    1997, 2005 stents   Diabetes (HCC)    Diabetes mellitus, type II (HCC)    Gout 1998   Heart attack (HCC)    [id number]   Hyperlipidemia    Hypertension    Hypertriglyceridemia     Subjective:   I have reviewed medical records including progress notes, labs and imaging.  I went to patient's room and before I even had an opportunity to introduce myself, patient is reporting 10/10 chest pain that is "crushing" in nature. She also reports associated shortness of breath. I notified Dr. Allena Katz, and he soon arrives to bedside.  ________________________________________________________  I spoke with her son/Angela Lucero by phone to discuss diagnosis, prognosis, GOC, EOL wishes, disposition, and options.  I introduced Palliative Medicine as specialized medical care for people living with serious illness. It focuses on providing relief from the symptoms and stress of a serious illness.   We discussed patient's current illness and what it means in the larger context of her ongoing co-morbidities.  Current clinical status was reviewed. Natural disease trajectory of chronic illness was discussed.  Created space and opportunity for son to express thoughts and feelings regarding patient's current medical situation.Values and goals of care were attempted to be elicited.  Life Review: Patient is originally from [place], but also spent time living in [place] and [place].  She is widowed x 2; her second husband died approximately 10 years ago.  Angela Lucero reports he is her only child that "associates with her".  Angela Lucero shares that patient enjoys being at home, looking at pictures of her great-grandchildren, fruits/vegetables, and baseball [facility]).   Functional Status: ***  Patient/Family Understanding of Illness: Angela Lucero shares that he and his mother both understand the severity of her disease.  We specifically discussed that she has advanced heart failure with low EF of 20 to 25%.  Goals: ***  GOC Discussion: Angela Lucero shares that he and his mother have been "talking about end-of-life" for a couple of years now.   Review of Systems  Respiratory:  Positive for shortness of breath.   Cardiovascular:  Positive for chest pain.    Objective:   Primary Diagnoses: Present on Admission:  Acute hypoxic respiratory failure (HCC)  Type 2 diabetes mellitus with complication, without long-term current use of insulin (HCC)  Dyslipidemia  Smoking  Acute on chronic hypoxic respiratory failure [facility])   Physical Exam Vitals reviewed.  Constitutional:      General:  She is in acute distress.     Appearance: She is ill-appearing.  Pulmonary:     Effort: Tachypnea and accessory muscle usage present.  Neurological:     Mental Status: She is alert.     Vital Signs:  BP 133/84   Pulse 92   Temp 97.6 F (36.4 C) (Oral)   Resp (!) 31   Ht 5\' 6"  (1.676 m)   Wt 89.1 kg   SpO2 93%   BMI 31.70 kg/m   Palliative Assessment/Data: PPS 40%     Assessment & Plan:   SUMMARY OF  RECOMMENDATIONS   Continue current interventions - treat the treatable Goal of care - medical stabilization and then rehab to improve functional status with ultimate goal to return home Patient and son are considering outpatient palliative versus hospice I plan to meet with son tomorrow at bedside  Primary Decision Maker: PATIENT with support from her son/Angela Lucero  Code Status/Advance Care Planning: DNR  Symptom Management:  ***  Prognosis:  < 6 months would not be surprising  Discharge Planning:  {Palliative dispostion:23505}   Discussed with: ***    Thank you for allowing [place] to participate in the care of Angela Lucero   Time Total: ***  Greater than 50%  of this time was spent counseling and coordinating care related to the above assessment and plan.  Signed by: Sherlean Foot, NP Palliative Medicine Team  Team Phone # [phone]  For individual providers, please see AMION

## 2023-02-08 DIAGNOSIS — I5023 Acute on chronic systolic (congestive) heart failure: Secondary | ICD-10-CM | POA: Diagnosis not present

## 2023-02-08 DIAGNOSIS — J441 Chronic obstructive pulmonary disease with (acute) exacerbation: Secondary | ICD-10-CM | POA: Diagnosis not present

## 2023-02-08 DIAGNOSIS — Z515 Encounter for palliative care: Secondary | ICD-10-CM | POA: Diagnosis not present

## 2023-02-08 DIAGNOSIS — I2510 Atherosclerotic heart disease of native coronary artery without angina pectoris: Secondary | ICD-10-CM | POA: Diagnosis not present

## 2023-02-08 DIAGNOSIS — Z952 Presence of prosthetic heart valve: Secondary | ICD-10-CM | POA: Diagnosis not present

## 2023-02-08 DIAGNOSIS — J9601 Acute respiratory failure with hypoxia: Secondary | ICD-10-CM | POA: Diagnosis not present

## 2023-02-08 LAB — BASIC METABOLIC PANEL WITH GFR
Anion gap: 12 (ref 5–15)
BUN: 62 mg/dL — ABNORMAL HIGH (ref 8–23)
CO2: 29 mmol/L (ref 22–32)
Calcium: 9 mg/dL (ref 8.9–10.3)
Chloride: 90 mmol/L — ABNORMAL LOW (ref 98–111)
Creatinine, Ser: 1.35 mg/dL — ABNORMAL HIGH (ref 0.44–1.00)
GFR, Estimated: 40 mL/min — ABNORMAL LOW
Glucose, Bld: 159 mg/dL — ABNORMAL HIGH (ref 70–99)
Potassium: 4.2 mmol/L (ref 3.5–5.1)
Sodium: 131 mmol/L — ABNORMAL LOW (ref 135–145)

## 2023-02-08 LAB — BASIC METABOLIC PANEL
Anion gap: 12 (ref 5–15)
BUN: 63 mg/dL — ABNORMAL HIGH (ref 8–23)
CO2: 30 mmol/L (ref 22–32)
Calcium: 9.2 mg/dL (ref 8.9–10.3)
Chloride: 89 mmol/L — ABNORMAL LOW (ref 98–111)
Creatinine, Ser: 1.42 mg/dL — ABNORMAL HIGH (ref 0.44–1.00)
GFR, Estimated: 37 mL/min — ABNORMAL LOW (ref >60)
Glucose, Bld: 162 mg/dL — ABNORMAL HIGH (ref 70–99)
Potassium: 4.1 mmol/L (ref 3.5–5.1)
Sodium: 131 mmol/L — ABNORMAL LOW (ref 135–145)

## 2023-02-08 LAB — CULTURE, BLOOD (ROUTINE X 2): Special Requests: ADEQUATE

## 2023-02-08 MED ORDER — METOLAZONE 2.5 MG PO TABS
2.5000 mg | ORAL_TABLET | Freq: Once | ORAL | Status: AC
  Administered 2023-02-08: 2.5 mg via ORAL
  Filled 2023-02-08: qty 1

## 2023-02-08 MED ORDER — TRAMADOL HCL 50 MG PO TABS
50.0000 mg | ORAL_TABLET | Freq: Four times a day (QID) | ORAL | Status: DC | PRN
  Administered 2023-02-08 – 2023-02-10 (×6): 50 mg via ORAL
  Filled 2023-02-08 (×6): qty 1

## 2023-02-08 MED ORDER — ENSURE ENLIVE PO LIQD
237.0000 mL | Freq: Two times a day (BID) | ORAL | Status: DC
  Administered 2023-02-08: 237 mL via ORAL

## 2023-02-08 NOTE — Progress Notes (Signed)
   Heart Failure Stewardship Pharmacist Progress Note   PCP: [name], MD PCP-Cardiologist: Gypsy Balsam, MD    HPI:  82 yo F with PMH of CHF, CAD, COPD, T2DM, HLD and HTN.   Admitted in 11/2021 with NSTEMI and acute systolic HF. Cath showed severe single vessel CAD with chronic total occlusion of mid LAD with R>L and L>L collaterals. Medical therapy recommended. ECHO showed LVEF 30-35%, RV normal, mild to moderate MR. cMRI done and showed subendocardial late gadolinium enhancement consistent with prior infarct in LAD. LGE >50% suggesting nonviability in this territory. LVEF 24%, RV normal, moderate MR.  Presented to the ED on 6/7 with shortness of breath, orthopnea, and chest discomfort. Has chronic LE edema but this was overall unchanged. BNP elevated, trop 147. CXR with bibasilar opacities representing atelectasis or infection. ECHO 6/8 showed LVEF reduced to 20-25%, global hypokinesis, RV normal, moderately elevated PA pressure, severe MR, RAP 15 mmHg.   Possible CIR at discharge.   Current HF Medications: Diuretic: furosemide 120 mg IV BID + metolazone 2.5 mg x 1 Beta Blocker: metoprolol XL 25 mg daily SGLT2i: Jardiance 25 mg daily Other: hydralazine 10 mg TID + Imdur 60 mg daily  Prior to admission HF Medications: Diuretic: furosemide 60 mg daily Beta blocker: metoprolol XL 25 mg daily ACE/ARB/ARNI: Entresto 24/26 mg BID SGLT2i: Jardiance 25 mg daily  Pertinent Lab Values: Serum creatinine 1.35, BUN 62, Potassium 4.2, Sodium 131, BNP 3951.4, Magnesium 2.2, A1c 6.0   Vital Signs: Weight: 196 lbs - bed weight (admission weight: 182 lbs) Blood pressure: 120/70s  Heart rate: 80s  I/O: -0.5L yesterday; net -4.2L  Medication Assistance / Insurance Benefits Check: Does the patient have prescription insurance?  Yes Type of insurance plan: [employer]  Outpatient Pharmacy:  Prior to admission [facility] Is the patient willing to use [facility] TOC pharmacy at  discharge? Yes Is the patient willing to transition their outpatient pharmacy to utilize a [facility] outpatient pharmacy?   Pending    Assessment: 1. Acute on chronic systolic CHF (LVEF 20-25%), due to ICM. NYHA class III symptoms. - Continue furosemide 120 mg IV BID and metolazone 2.5 mg x 1. Strict I/Os and daily weights. Encourage standing weights for better accuracy. Keep K>4 and Mg>2. - Continue metoprolol XL 25 mg daily - Have been holding Entresto and Jardiance with AKI. Creatinine improving, almost back to baseline. Agree with restarting Jardiance 25 mg daily. - Continue hydralazine 10 mg TID and Imdur 60 mg daily   Plan: 1) Medication changes recommended at this time: - Continue IV diuresis  2) Patient assistance: - Has insurance through the [place]  3)  Education  - To be completed prior to discharge  Sharen Hones, PharmD, BCPS Heart Failure Engineer, building services Phone [phone]

## 2023-02-08 NOTE — Progress Notes (Addendum)
HD#3 Subjective:   Summary: Angela Lucero is a 82 y.o. female with a past medical history of HFrEF, hypertension, type 2 diabetes who presents to the emergency department with concerns of shortness of breath.  Patient admitted for further evaluation and management of acute hypoxic respiratory failure.   Overnight Events: Patient had chest pain around 1600 yesterday. EKG did not show any evidence of acute ischemia. Troponins did not significantly elevate. Patient received 2 nitroglycerin tablets 5 minutes apart and chest pain subsided.   Patient evaluated at bedside this AM. Patient states appetite doing alright. Endorses SOB and chest pain. Still has cough but unable to produce sputum. Did not receive breathing treatment last night. Denies fever, chills. Discussed the plan with patient and answered all questions.   Objective:  Vital signs in last 24 hours: Vitals:   02/07/23 2205 02/08/23 0048 02/08/23 0350 02/08/23 0519  BP: 133/75 132/76 128/77 (!) 128/59  Pulse:  88 85   Resp:  19 19   Temp:  97.6 F (36.4 C) 97.7 F (36.5 C)   TempSrc:  Oral Oral   SpO2:   94%   Weight:      Height:       Supplemental O2: HFNC SpO2: 94 % O2 Flow Rate (L/min): 3 L/min FiO2 (%): 60 %   Physical Exam:  Constitutional: Resting in bed, no acute distress, nasal cannula in place HENT: normocephalic atraumatic Cardiovascular: regular rate and rhythm, no m/r/g Pulmonary/Chest: Expiratory wheezing appreciated throughout.  Decreased breath sounds throughout. Extremities: Trace pitting edema appreciated  Filed Weights   02/05/23 0531 02/06/23 0400 02/07/23 0437  Weight: 83.5 kg 84.8 kg 89.1 kg     Intake/Output Summary (Last 24 hours) at 02/08/2023 0551 Last data filed at 02/08/2023 0521 Gross per 24 hour  Intake 1257.7 ml  Output 2350 ml  Net -1092.3 ml    Net IO Since Admission: -4,214 mL [02/08/23 0551]  Pertinent Labs:    Latest Ref Rng & Units 02/06/2023    1:13 AM 02/05/2023    4:29  AM 02/04/2023   11:52 AM  CBC  WBC 4.0 - 10.5 K/uL 10.7  11.0  8.9   Hemoglobin 12.0 - 15.0 g/dL 16.1  09.6  04.5   Hematocrit 36.0 - 46.0 % 36.7  37.7  40.0   Platelets 150 - 400 K/uL 316  312  340        Latest Ref Rng & Units 02/08/2023   12:56 AM 02/07/2023    1:00 AM 02/06/2023    1:13 AM  CMP  Glucose 70 - 99 mg/dL 409  811  914   BUN 8 - 23 mg/dL 63  60  58   Creatinine 0.44 - 1.00 mg/dL 7.82  9.56  2.13   Sodium 135 - 145 mmol/L 131  130  126   Potassium 3.5 - 5.1 mmol/L 4.1  3.8  4.1   Chloride 98 - 111 mmol/L 89  88  90   CO2 22 - 32 mmol/L 30  27  24    Calcium 8.9 - 10.3 mg/dL 9.2  8.8  8.4     Imaging: No results found.  Assessment/Plan:   Principal Problem:   Acute hypoxic respiratory failure (HCC) Active Problems:   Type 2 diabetes mellitus with complication, without long-term current use of insulin (HCC)   Congestive heart failure (CHF) (HCC)   Smoking   Dyslipidemia   Hyponatremia   Elevated troponin   Acute on chronic hypoxic respiratory  failure (HCC)   COPD exacerbation (HCC)   Acute on chronic HFrEF (heart failure with reduced ejection fraction) (HCC)   AKI (acute kidney injury) (HCC)   Patient Summary: Angela Lucero is a 82 y.o. female with a past medical history of HFrEF, hypertension, type 2 diabetes who presents to the emergency department with concerns of shortness of breath.  Patient admitted for further evaluation and management of acute hypoxic respiratory failure.   #Acute hypoxic respiratory failure #Acute on chronic HFrEF (Ischemic cardiomyopathy) #CAD status post stenting #Severe mitral regurgitation Patient down to 3 L nasal cannula.  Patient is short of breath today.  Patient good urine output yesterday.  A total of 4.2 L during hospitalization.  I think patient is approaching euvolemia.  Will have 1 more day of diuresing, and possibly transition her to oral Lasix.  Palliative care to see patient today -Cardiology following, appreciate recs  who is giving Zaroxolyn  -Continue home metoprolol 25 mg daily -Continue Imdur 60 mg daily -Continue hydralazine 10 mg 3 times daily  -Continue Lasix 120 mg IV twice daily -Cardiology following, appreciate recs -Will hold Entresto Continue Jardiance 25 mg daily -Strict I's and O's, daily weights  -Palliative care following  #COPD exacerbation Today will be last day of prednisone.  Patient is significantly short of breath on my exam.  She has significant shortness of breath as well as expiratory wheezing.  O2 saturations were in the upper 80s.  Patient did not have her breathing treatments, and likely the cause. -Finish prednisone day 5 -Continue Incruse Ellipta -Continue scheduled DuoNebs -Continue to wean oxygen as tolerated -Recommended CIR, and once medically stable, patient will go to CIR    #Pre-Renal AKI on CKD stage IIIb, resolved #Hyponatremia, improving AKI resolved.  Creatinine at baseline.  Sodium improved to 131.  Patient is down about a total of 4.2 L during hospitalization.  Will continue with diuresing.   -Monitor BMP -Hold home Entresto   #Type 2 diabetes mellitus A1c 6.0.  Glucose measuring well during hospitalization.  Resumed home Jardiance yesterday. -Continue to monitor BMP -Continue home Jardiance 25 mg daily  Diet: Heart Healthy IVF: None,None VTE: Enoxaparin Code: DNR/DNI PT/OT recs: Pending, none.  Dispo: Anticipated discharge to Rehab in 2 days pending medical improvement.   Modena Slater DO Internal Medicine Resident PGY-1 [phone] Please contact the on call pager after 5 pm and on weekends at [phone].

## 2023-02-08 NOTE — Progress Notes (Signed)
Inpatient Rehab Admissions Coordinator:   I spoke to pt's son, Trey Paula, over the phone to review rehab recommendations and goals/expectations of CIR stay.  He is in agreement for transition to CIR when medically ready and states he's arranging help for IADLs through pt's neighbor, Chrissy, and home care nurses.  Potential plan to pursue hospice vs palliative care at discharge but pt will return home.  We reviewed medicare benefits.  We reviewed currently on IV lasix 120 mg BID, which is outside of what CIR would manage.  Will follow for transition to lower/oral dose and hopeful for CIR maybe later this week, pending clearance from medical team.    Estill Dooms, PT, DPT Admissions Coordinator [phone] 02/08/23  1:06 PM

## 2023-02-08 NOTE — Care Management Important Message (Signed)
Important Message  Patient Details  Name: Meshell Abdulaziz MRN: [id number] Date of Birth: 1940/10/07   Medicare Important Message Given:  Yes     Sherilyn Banker 02/08/2023, 4:27 PM

## 2023-02-08 NOTE — Progress Notes (Signed)
Progress Note  Patient Name: Angela Lucero Date of Encounter: 02/08/2023  Primary Cardiologist:   Gypsy Balsam, MD   Subjective   She had an episode of acute SOB this morning that responded to nebulizer.    Inpatient Medications    Scheduled Meds:  aspirin EC  81 mg Oral Daily   atorvastatin  80 mg Oral Daily   empagliflozin  25 mg Oral Daily   enoxaparin (LOVENOX) injection  40 mg Subcutaneous Q24H   hydrALAZINE  10 mg Oral Q8H   ipratropium-albuterol  3 mL Nebulization TID   isosorbide mononitrate  60 mg Oral Daily   melatonin  5 mg Oral QHS   metoprolol succinate  25 mg Oral Daily   umeclidinium bromide  1 puff Inhalation Daily   Continuous Infusions:  furosemide 120 mg (02/08/23 0841)   PRN Meds: acetaminophen, albuterol, nitroGLYCERIN, ondansetron (ZOFRAN) IV, mouth rinse, polyethylene glycol, simethicone   Vital Signs    Vitals:   02/08/23 0350 02/08/23 0519 02/08/23 0713 02/08/23 0842  BP: 128/77 (!) 128/59 136/72   Pulse: 85  98 95  Resp: 19  20 (!) 25  Temp: 97.7 F (36.5 C)  97.6 F (36.4 C)   TempSrc: Oral  Oral   SpO2: 94%  90% 92%  Weight:      Height:        Intake/Output Summary (Last 24 hours) at 02/08/2023 0847 Last data filed at 02/08/2023 0521 Gross per 24 hour  Intake 897.7 ml  Output 2350 ml  Net -1452.3 ml   Filed Weights   02/05/23 0531 02/06/23 0400 02/07/23 0437  Weight: 83.5 kg 84.8 kg 89.1 kg    Telemetry    NSR, with PVCs, no sustained arrhythmia. - Personally Reviewed  ECG    NA - Personally Reviewed  Physical Exam   GEN: No  acute distress.   Neck: No  JVD Cardiac: RRR, no murmurs, rubs, or gallops.  Respiratory:     Decreased breath sounds with diffuse wheezing GI: Soft, nontender, non-distended, normal bowel sounds  MS:  No edema; No deformity. Neuro:   Nonfocal  Psych: Oriented and appropriate   Labs    Chemistry Recent Labs  Lab 02/04/23 1152 02/04/23 1746 02/06/23 0113 02/07/23 0100  02/08/23 0056  NA 122*   < > 126* 130* 131*  K 5.0   < > 4.1 3.8 4.1  CL 88*   < > 90* 88* 89*  CO2 21*   < > 24 27 30   GLUCOSE 43*   < > 151* 141* 162*  BUN 45*   < > 58* 60* 63*  CREATININE 1.89*   < > 1.55* 1.45* 1.42*  CALCIUM 9.2   < > 8.4* 8.8* 9.2  PROT 6.8  --   --   --   --   ALBUMIN 3.3*  --   --   --   --   AST 56*  --   --   --   --   ALT 35  --   --   --   --   ALKPHOS 33*  --   --   --   --   BILITOT 0.8  --   --   --   --   GFRNONAA 27*   < > 34* 36* 37*  ANIONGAP 13   < > 12 15 12    < > = values in this interval not displayed.     Hematology Recent Labs  Lab 02/04/23 1152 02/05/23 0429 02/06/23 0113  WBC 8.9 11.0* 10.7*  RBC 4.26 4.22 4.07  HGB 13.2 12.8 12.3  HCT 40.0 37.7 36.7  MCV 93.9 89.3 90.2  MCH 31.0 30.3 30.2  MCHC 33.0 34.0 33.5  RDW 14.6 14.6 14.6  PLT 340 312 316    Cardiac EnzymesNo results for input(s): "TROPONINI" in the last 168 hours. No results for input(s): "TROPIPOC" in the last 168 hours.   BNP Recent Labs  Lab 02/04/23 1152  BNP 3,951.4*     DDimer No results for input(s): "DDIMER" in the last 168 hours.   Radiology    No results found.  Cardiac Studies   Echo:  1. Left ventricular ejection fraction, by estimation, is 20 to 25%. The  left ventricle has severely decreased function. The left ventricle  demonstrates global hypokinesis. The left ventricular internal cavity size  was mildly dilated. Indeterminate  diastolic filling due to E-A fusion.   2. Right ventricular systolic function is normal. The right ventricular  size is mildly enlarged. There is moderately elevated pulmonary artery  systolic pressure. The estimated right ventricular systolic pressure is  49.1 mmHg.   3. Left atrial size was severely dilated.   4. Right atrial size was mildly dilated.   5. The mitral valve is abnormal. Severe mitral valve regurgitation. No  evidence of mitral stenosis.   6. The aortic valve is tricuspid. Aortic valve  regurgitation is not  visualized. Aortic valve sclerosis/calcification is present, without any  evidence of aortic stenosis.   7. The inferior vena cava is dilated in size with <50% respiratory  variability, suggesting right atrial pressure of 15 mmHg.    Patient Profile     82 y.o. female with systolic heart failure (EF 24%), CAD, CTO of mid LAD with collaterals, diabetes, CKD IIIb admitted on 02/04/2023 for acute on chronic systolic heart failure.   Assessment & Plan     Acute on chronic systolic heart failure, EF 20-25%:   Net negative 4.2 liters.  Output not as good yesterday.      Will give one dose of Zaroxolyn.   However, I do suspect that ongoing hypoxemia is more primary pulmonary.    Will likely back off on diuretic tomorrow morning.    CAD/CTO of mid LAD (nonviable):    Medical management.  Elevated trop indicative of demand ischemia.    No further ischemia work up.  I don't see a value in serial troponin draws.    Hypervolemic hyponatremia:   Na low but continues to slowly improving.     CKD stage IIIb:    Creat is again slightly improved.      For questions or updates, please contact [facility] Please consult [email or web address] for contact info under Cardiology/STEMI.   Signed, Rollene Rotunda, MD  02/08/2023, 8:47 AM

## 2023-02-08 NOTE — Progress Notes (Signed)
Physical Therapy Treatment Patient Details Name: Angela Lucero MRN: [id number] DOB: 1940/11/07 Today's Date: 02/08/2023   History of Present Illness 82yo woman who presented 02/04/23 with acute progressive shortness of breath associated with acute sharp chest pain.+acute CHF, COPD exacerbation; required HFNC O2; PMH COPD, tobacco use, HFrEF (EF 30-35% in 11/2021), DM, CAD, CVA, HTN    PT Comments    Pt with slow progress due to low activity tolerance. Pt incontinent of urine and managing of that creates fatigue in patient prior to trying to amb. Amb distance remains short due to fatigue.    Recommendations for follow up therapy are one component of a multi-disciplinary discharge planning process, led by the attending physician.  Recommendations may be updated based on patient status, additional functional criteria and insurance authorization.  Follow Up Recommendations       Assistance Recommended at Discharge Set up Supervision/Assistance  Patient can return home with the following A little help with walking and/or transfers;A little help with bathing/dressing/bathroom;Assistance with cooking/housework;Assist for transportation;Help with stairs or ramp for entrance   Equipment Recommendations  Other (comment) (To be determined)    Recommendations for Other Services       Precautions / Restrictions Precautions Precautions: Fall Precaution Comments: watch sats, bone on bone L knee Restrictions Weight Bearing Restrictions: No     Mobility  Bed Mobility Overal bed mobility: Needs Assistance Bed Mobility: Supine to Sit     Supine to sit: Min assist     General bed mobility comments: Assist to elevate trunk into sitting    Transfers Overall transfer level: Needs assistance Equipment used: Rolling walker (2 wheels) Transfers: Sit to/from Stand, Bed to chair/wheelchair/BSC Sit to Stand: Min guard   Step pivot transfers: Min guard       General transfer comment: Verbal cues  for hand placement. Assist for safety    Ambulation/Gait Ambulation/Gait assistance: Min assist Gait Distance (Feet): 3 Feet Assistive device: Rolling walker (2 wheels) Gait Pattern/deviations: Step-through pattern, Decreased step length - right, Decreased step length - left, Shuffle, Trunk flexed Gait velocity: decr Gait velocity interpretation: <1.31 ft/sec, indicative of household ambulator   General Gait Details: Assist for support and balance. Pt leans over and props forearms on walker to rest. Pt fatigues quickly with activity   Stairs             Wheelchair Mobility    Modified Rankin (Stroke Patients Only)       Balance Overall balance assessment: Needs assistance Sitting-balance support: No upper extremity supported Sitting balance-Leahy Scale: Good     Standing balance support: Bilateral upper extremity supported, During functional activity, Reliant on assistive device for balance Standing balance-Leahy Scale: Poor Standing balance comment: walker and min guard for static standing                            Cognition Arousal/Alertness: Awake/alert Behavior During Therapy: WFL for tasks assessed/performed Overall Cognitive Status: Within Functional Limits for tasks assessed                                          Exercises      General Comments General comments (skin integrity, edema, etc.): VSS on 3L      Pertinent Vitals/Pain Pain Assessment Pain Assessment: Faces Faces Pain Scale: No hurt    Home Living  Prior Function            PT Goals (current goals can now be found in the care plan section) Progress towards PT goals: Not progressing toward goals - comment (Gait not progressing)    Frequency    Min 1X/week      PT Plan Current plan remains appropriate    Co-evaluation              AM-PAC PT "6 Clicks" Mobility   Outcome Measure  Help needed turning  from your back to your side while in a flat bed without using bedrails?: A Little Help needed moving from lying on your back to sitting on the side of a flat bed without using bedrails?: A Little Help needed moving to and from a bed to a chair (including a wheelchair)?: A Little Help needed standing up from a chair using your arms (e.g., wheelchair or bedside chair)?: A Little Help needed to walk in hospital room?: Total Help needed climbing 3-5 steps with a railing? : Total 6 Click Score: 14    End of Session Equipment Utilized During Treatment: Gait belt;Oxygen Activity Tolerance: Patient limited by fatigue Patient left: with call bell/phone within reach;in chair;with chair alarm set (sitting EOB with nurse present) Nurse Communication: Mobility status PT Visit Diagnosis: Other abnormalities of gait and mobility (R26.89);Muscle weakness (generalized) (M62.81)     Time: 6440-3474 PT Time Calculation (min) (ACUTE ONLY): 21 min  Charges:  $Gait Training: 8-22 mins                     [facility] PT Acute Rehabilitation Services Office [phone]    Angelina Ok [facility] 02/08/2023, 2:21 PM

## 2023-02-09 DIAGNOSIS — Z952 Presence of prosthetic heart valve: Secondary | ICD-10-CM | POA: Diagnosis not present

## 2023-02-09 DIAGNOSIS — J9601 Acute respiratory failure with hypoxia: Secondary | ICD-10-CM | POA: Diagnosis not present

## 2023-02-09 DIAGNOSIS — I2510 Atherosclerotic heart disease of native coronary artery without angina pectoris: Secondary | ICD-10-CM | POA: Diagnosis not present

## 2023-02-09 DIAGNOSIS — I5023 Acute on chronic systolic (congestive) heart failure: Secondary | ICD-10-CM | POA: Diagnosis not present

## 2023-02-09 LAB — BASIC METABOLIC PANEL
Anion gap: 16 — ABNORMAL HIGH (ref 5–15)
BUN: 69 mg/dL — ABNORMAL HIGH (ref 8–23)
CO2: 35 mmol/L — ABNORMAL HIGH (ref 22–32)
Calcium: 9.4 mg/dL (ref 8.9–10.3)
Chloride: 83 mmol/L — ABNORMAL LOW (ref 98–111)
Creatinine, Ser: 1.45 mg/dL — ABNORMAL HIGH (ref 0.44–1.00)
GFR, Estimated: 36 mL/min — ABNORMAL LOW (ref >60)
Glucose, Bld: 159 mg/dL — ABNORMAL HIGH (ref 70–99)
Potassium: 3.6 mmol/L (ref 3.5–5.1)
Sodium: 134 mmol/L — ABNORMAL LOW (ref 135–145)

## 2023-02-09 LAB — CULTURE, BLOOD (ROUTINE X 2): Special Requests: ADEQUATE

## 2023-02-09 MED ORDER — FUROSEMIDE 40 MG PO TABS
40.0000 mg | ORAL_TABLET | Freq: Two times a day (BID) | ORAL | Status: DC
  Administered 2023-02-09 – 2023-02-10 (×3): 40 mg via ORAL
  Filled 2023-02-09 (×3): qty 1

## 2023-02-09 MED ORDER — SENNOSIDES-DOCUSATE SODIUM 8.6-50 MG PO TABS
2.0000 | ORAL_TABLET | Freq: Two times a day (BID) | ORAL | Status: DC
  Administered 2023-02-09 – 2023-02-10 (×3): 2 via ORAL
  Filled 2023-02-09 (×3): qty 2

## 2023-02-09 MED ORDER — UMECLIDINIUM-VILANTEROL 62.5-25 MCG/ACT IN AEPB
1.0000 | INHALATION_SPRAY | Freq: Every day | RESPIRATORY_TRACT | Status: DC
  Administered 2023-02-10: 1 via RESPIRATORY_TRACT
  Filled 2023-02-09: qty 14

## 2023-02-09 NOTE — PMR Pre-admission (Signed)
PMR Admission Coordinator Pre-Admission Assessment  Patient: Angela Lucero is an 82 y.o., female MRN: [id number] DOB: 09-16-1940 Height: 5\' 6"  (167.6 cm) Weight: 89.1 kg  Insurance Information HMO:     PPO:      PCP:      IPA:      80/20:      OTHER:  PRIMARY: medicare A/B      Policy#: [id number]       Subscriber: pt CM Name:       Phone#:      Fax#:  Pre-Cert#: verified Health and safety inspector:  Benefits:  Phone #:      Name:  Eff. Date:  04/30/98 A/B     Deduct: $1632      Out of Pocket Max: n/a      Life Max: n/a CIR: 100%      SNF: 20 full days remaining Outpatient: 80%     Co-Pay: 20% Home Health: 100%      Co-Pay:  DME: 80%     Co-Pay: 20% Providers:  [employer]      Policy#: [id number]     Phone#: [phone]  Financial Counselor:       Phone#:   The "Data Collection Information Summary" for patients in Inpatient Rehabilitation Facilities with attached "Privacy Act Statement-Health Care Records" was provided and verbally reviewed with: Patient and Family  Emergency Contact Information Contact Information     Name Relation Home Work Mobile   Pitts,Jeffrey Son   [phone]       Current Medical History  Patient Admitting Diagnosis: cardiac debility   History of Present Illness: Pt is an 82 y/o female with PMH of COPD with ongoing tobacco use, HFrEF, DM, CAD, CVA, and HTN admitted to [facility] on 02/04/23 with progressive shortness of breath, chest pain.  In ED pt endorses orthopnea, increase in chronic cough with yellow sputum production.  She was 80% on room air.  Labs notable for sodium 122, blood glucose 43, BUN 45, Creatinine 1.89, troponin 147.  She was admitted for CHF and COPD exacerbation.  CXR showed bibasilar infiltrates, EKG showed right BBB and sinus tach.  Viral panel negative for covid and flu.  Cardiology was consulted and recommended increasing lasix for diuresis, hold entresto, continue metoprolol, add Hydralazine.  Pt underwent TTE on 6/8 which estimated  EF to be 20-25% and noted severe mitral valve regurgitation.  Hospital course notable for managing diuresis in the setting of AKI, new need for oxygen (up to 3 L), prednisone taper for COPD exac, and palliative consult for GOC discussions.  Therapy ongoing and pt was recommended for CIR due to significant deficits in cardiovascular endurance.     Patient's medical record from [facility] has been reviewed by the rehabilitation admission coordinator and physician.  Past Medical History  Past Medical History:  Diagnosis Date   Arthritis 2010   Lower back and neck   CAD (coronary artery disease)    COPD (chronic obstructive pulmonary disease) (HCC)    CVA (cerebral vascular accident) (HCC)    1997, 2005 stents   Diabetes (HCC)    Diabetes mellitus, type II (HCC)    Gout 1998   Heart attack (HCC)    1027,2536   Hyperlipidemia    Hypertension    Hypertriglyceridemia     Has the patient had major surgery during 100 days prior to admission? No  Family History   family history includes Cancer in her brother; Heart disease  in her father and mother; Hypertension in her father and mother.  Current Medications  Current Facility-Administered Medications:    acetaminophen (TYLENOL) tablet 1,000 mg, 1,000 mg, Oral, Q6H PRN, Marrianne Mood, MD, 1,000 mg at 02/08/23 1350   albuterol (PROVENTIL) (2.5 MG/3ML) 0.083% nebulizer solution 2.5 mg, 2.5 mg, Nebulization, Q6H PRN, Mercie Eon, MD, 2.5 mg at 02/07/23 1610   aspirin EC tablet 81 mg, 81 mg, Oral, Daily, Modena Slater, DO, 81 mg at 02/10/23 0811   atorvastatin (LIPITOR) tablet 80 mg, 80 mg, Oral, Daily, Modena Slater, DO, 80 mg at 02/10/23 0811   empagliflozin (JARDIANCE) tablet 25 mg, 25 mg, Oral, Daily, Patel, Amar, DO, 25 mg at 02/10/23 0812   enoxaparin (LOVENOX) injection 40 mg, 40 mg, Subcutaneous, Q24H, Atway, Rayann N, DO, 40 mg at 02/09/23 1656   feeding supplement (ENSURE ENLIVE / ENSURE PLUS) liquid 237 mL, 237 mL, Oral, BID BM,  Patel, Amar, DO, 237 mL at 02/08/23 1517   furosemide (LASIX) tablet 40 mg, 40 mg, Oral, BID, Rollene Rotunda, MD, 40 mg at 02/10/23 9604   hydrALAZINE (APRESOLINE) tablet 10 mg, 10 mg, Oral, Q8H, O'Neal, Ronnald Ramp, MD, 10 mg at 02/10/23 0548   ipratropium-albuterol (DUONEB) 0.5-2.5 (3) MG/3ML nebulizer solution 3 mL, 3 mL, Nebulization, TID, Mercie Eon, MD, 3 mL at 02/10/23 0909   isosorbide mononitrate (IMDUR) 24 hr tablet 60 mg, 60 mg, Oral, Daily, Atway, Rayann N, DO, 60 mg at 02/10/23 5409   losartan (COZAAR) tablet 12.5 mg, 12.5 mg, Oral, Daily, Hochrein, James, MD, 12.5 mg at 02/10/23 0817   melatonin tablet 5 mg, 5 mg, Oral, QHS, Marrianne Mood, MD, 5 mg at 02/09/23 2324   metoprolol succinate (TOPROL-XL) 24 hr tablet 25 mg, 25 mg, Oral, Daily, O'Neal, Ronnald Ramp, MD, 25 mg at 02/10/23 0811   nitroGLYCERIN (NITROSTAT) SL tablet 0.4 mg, 0.4 mg, Sublingual, Q5 min PRN, Allena Katz, Amar, DO, 0.4 mg at 02/07/23 1615   ondansetron (ZOFRAN) injection 4 mg, 4 mg, Intravenous, Q6H PRN, Sherlean Foot B, NP, 4 mg at 02/07/23 2207   Oral care mouth rinse, 15 mL, Mouth Rinse, PRN, Mercie Eon, MD   polyethylene glycol (MIRALAX / GLYCOLAX) packet 17 g, 17 g, Oral, Daily PRN, Modena Slater, DO, 17 g at 02/09/23 0901   senna-docusate (Senokot-S) tablet 2 tablet, 2 tablet, Oral, BID, Rana Snare, DO, 2 tablet at 02/10/23 0811   simethicone (MYLICON) chewable tablet 80 mg, 80 mg, Oral, QID PRN, Mercie Eon, MD, 80 mg at 02/08/23 2107   traMADol (ULTRAM) tablet 50 mg, 50 mg, Oral, Q6H PRN, Modena Slater, DO, 50 mg at 02/10/23 0547   umeclidinium-vilanterol (ANORO ELLIPTA) 62.5-25 MCG/ACT 1 puff, 1 puff, Inhalation, Daily, Modena Slater, DO, 1 puff at 02/10/23 0920  Patients Current Diet:  Diet Order             Diet Heart Room service appropriate? Yes; Fluid consistency: Thin  Diet effective now                   Precautions / Restrictions Precautions Precautions: Fall Precaution  Comments: watch sats, bone on bone L knee Restrictions Weight Bearing Restrictions: No   Has the patient had 2 or more falls or a fall with injury in the past year? No  Prior Activity Level Household: no device in the home, son reports "homebody", he or neighbor does errands for patient, not driving  Prior Functional Level Self Care: Did the patient need help bathing, dressing, using the  toilet or eating? Independent  Indoor Mobility: Did the patient need assistance with walking from room to room (with or without device)? Independent  Stairs: Did the patient need assistance with internal or external stairs (with or without device)? Independent  Functional Cognition: Did the patient need help planning regular tasks such as shopping or remembering to take medications? Independent  Patient Information Are you of Hispanic, Latino/a,or Spanish origin?: A. No, not of Hispanic, Latino/a, or Spanish origin What is your race?: A. White Do you need or want an interpreter to communicate with a doctor or health care staff?: 0. No  Patient's Response To:  Health Literacy and Transportation Is the patient able to respond to health literacy and transportation needs?: Yes Health Literacy - How often do you need to have someone help you when you read instructions, pamphlets, or other written material from your doctor or pharmacy?: Never In the past 12 months, has lack of transportation kept you from medical appointments or from getting medications?: No In the past 12 months, has lack of transportation kept you from meetings, work, or from getting things needed for daily living?: No  Home Assistive Devices / Equipment Home Assistive Devices/Equipment: Environmental consultant (specify type), Cane (specify quad or straight) (last two days using walker, pt uses cane outside on a daily basis) Home Equipment: Cane - single point, [employer] (2 wheels)  Prior Device Use: Indicate devices/aids used by the patient prior  to current illness, exacerbation or injury? None of the above  Current Functional Level Cognition  Overall Cognitive Status: Within Functional Limits for tasks assessed Orientation Level: Oriented X4 General Comments: pt hesitant to seek medical attention as her condition became worse and she was needing to sleep in the couch to elevate her head    Extremity Assessment (includes Sensation/Coordination)  Upper Extremity Assessment: Overall WFL for tasks assessed  Lower Extremity Assessment: Defer to PT evaluation    ADLs  Overall ADL's : Needs assistance/impaired Eating/Feeding: Independent, Sitting Grooming: Set up, Sitting Upper Body Bathing: Set up, Sitting Lower Body Bathing: Minimal assistance, Sit to/from stand Upper Body Dressing : Set up, Sitting Lower Body Dressing: Minimal assistance, Sit to/from stand Toilet Transfer: Min guard, Stand-pivot, Rolling walker (2 wheels) Toileting- Clothing Manipulation and Hygiene: Minimal assistance, Sit to/from stand    Mobility  Overal bed mobility: Needs Assistance Bed Mobility: Supine to Sit Supine to sit: Min assist Sit to supine: Min assist General bed mobility comments: Assist to elevate trunk into sitting    Transfers  Overall transfer level: Needs assistance Equipment used: Rolling walker (2 wheels) Transfers: Sit to/from Stand, Bed to chair/wheelchair/BSC Sit to Stand: Min guard Bed to/from chair/wheelchair/BSC transfer type:: Step pivot Step pivot transfers: Min guard General transfer comment: Verbal cues for hand placement. Assist for safety    Ambulation / Gait / Stairs / Wheelchair Mobility  Ambulation/Gait Ambulation/Gait assistance: Min Chemical engineer (Feet): 3 Feet Assistive device: Rolling walker (2 wheels) Gait Pattern/deviations: Step-through pattern, Decreased step length - right, Decreased step length - left, Shuffle, Trunk flexed General Gait Details: Assist for support and balance. Pt leans over and  props forearms on walker to rest. Pt fatigues quickly with activity Gait velocity: decr Gait velocity interpretation: <1.31 ft/sec, indicative of household ambulator    Posture / Balance Balance Overall balance assessment: Needs assistance Sitting-balance support: No upper extremity supported Sitting balance-Leahy Scale: Good Standing balance support: Bilateral upper extremity supported, During functional activity, Reliant on assistive device for balance Standing balance-Leahy Scale: Poor  Standing balance comment: walker and min guard for static standing    Special needs/care consideration Oxygen acutely, Diabetic management yes, and Special service needs palliative medicine   Previous Home Environment (from acute therapy documentation) Living Arrangements: Alone Available Help at Discharge: Neighbor, Available PRN/intermittently, Family Type of Home: Mobile home Home Layout: One level Home Access: Stairs to enter Entrance Stairs-Rails: Left Entrance Stairs-Number of Steps: 3 Bathroom Shower/Tub: Associate Professor: No Home Care Services: No  Discharge Living Setting Plans for Discharge Living Setting: Patient's home (care from son, neighbor, and hired caregivers) Type of Home at Discharge: Mobile home Discharge Home Layout: One level Discharge Home Access: Stairs to enter Entrance Stairs-Rails: Right, Left Entrance Stairs-Number of Steps: 3 Discharge Bathroom Shower/Tub: Tub/shower unit Discharge Bathroom Toilet: Standard Discharge Bathroom Accessibility:  (unsure) Does the patient have any problems obtaining your medications?: No  Social/Family/Support Systems Anticipated Caregiver: son, [name] will be primary contact for multiple caregivers Anticipated Caregiver's Contact Information: Trey Paula [phone] Ability/Limitations of Caregiver: none stated Caregiver Availability: 24/7 Discharge Plan Discussed with Primary  Caregiver: Yes Is Caregiver In Agreement with Plan?: Yes Does Caregiver/Family have Issues with Lodging/Transportation while Pt is in Rehab?: No  Goals Patient/Family Goal for Rehab: PT/OT supervision to mod I, SLP n/a Expected length of stay: 10-14 days Additional Information: Discharge plan: home to pt's house with 24/7 care from son, neighbor, and hired caregivers.  Likely transition to hospice at home at discharge given medical status with advanced pulmonary and cardiac disease Pt/Family Agrees to Admission and willing to participate: Yes Program Orientation Provided & Reviewed with Pt/Caregiver Including Roles  & Responsibilities: Yes Additional Information Needs: working with Palliative in the acute setting to monitor goals of care  Barriers to Discharge: Medical stability, Home environment access/layout  Decrease burden of Care through IP rehab admission: n/a  Possible need for SNF placement upon discharge: Not anticipated.  Discharge plan: return to patient's home, her son Trey Paula) her neighbor Prentice Docker) and hired caregivers will provide 24/7 care at discharge.  Plan for initiation of hospice services once d/c'd home.    Patient Condition: I have reviewed medical records from [facility], spoken with CM, and patient and son. I met with patient at the bedside and discussed via phone for inpatient rehabilitation assessment.  Patient will benefit from ongoing PT and OT, can actively participate in 3 hours of therapy a day 5 days of the week, and can make measurable gains during the admission.  Patient will also benefit from the coordinated team approach during an Inpatient Acute Rehabilitation admission.  The patient will receive intensive therapy as well as Rehabilitation physician, nursing, social worker, and care management interventions.  Due to safety, skin/wound care, disease management, medication administration, pain management, and patient education the patient requires 24 hour a day  rehabilitation nursing.  The patient is currently min assist with mobility and basic ADLs.  Discharge setting and therapy post discharge at home with home health is anticipated.  Patient has agreed to participate in the Acute Inpatient Rehabilitation Program and will admit today.  Preadmission Screen Completed By:  Stephania Fragmin, PT, DPT 02/10/2023 9:54 AM ______________________________________________________________________   Discussed status with Dr. Shearon Stalls on 02/10/23  at 9:54 AM  and received approval for admission today.  Admission Coordinator:  Stephania Fragmin, PT, DPT time 9:54 AM Dorna Bloom 02/10/23    Assessment/Plan: Diagnosis: Does the need for close, 24 hr/day Medical supervision in concert with the patient's rehab needs make  it unreasonable for this patient to be served in a less intensive setting? Yes Co-Morbidities requiring supervision/potential complications: acute on chronic HFrEF, hypoxemic respiratory failure w/ COPD exacerbation,  hypertension, DM2, AKI on CKD stage IIIb, hyponatremia, constipation and urinary incontinence Due to bladder management, bowel management, safety, skin/wound care, disease management, medication administration, pain management, and patient education, does the patient require 24 hr/day rehab nursing? Yes Does the patient require coordinated care of a physician, rehab nurse, PT, OT to address physical and functional deficits in the context of the above medical diagnosis(es)? Yes Addressing deficits in the following areas: balance, endurance, locomotion, strength, transferring, bowel/bladder control, bathing, dressing, feeding, grooming, and toileting Can the patient actively participate in an intensive therapy program of at least 3 hrs of therapy 5 days a week? Yes The potential for patient to make measurable gains while on inpatient rehab is good Anticipated functional outcomes upon discharge from inpatient rehab: supervision PT, supervision OT,   Estimated rehab length of stay to reach the above functional goals is: 10-14 days Anticipated discharge destination: Home 10. Overall Rehab/Functional Prognosis: good   MD Signature:  Angelina Sheriff, DO 02/10/2023

## 2023-02-09 NOTE — Progress Notes (Signed)
HD#4 Subjective:   Summary: Angela Lucero is a 82 y.o. female with a past medical history of HFrEF, hypertension, type 2 diabetes who presents to the emergency department with concerns of shortness of breath.  Patient admitted for further evaluation and management of acute hypoxic respiratory failure.   Overnight Events: No acute events overnight.  Patient states doing well with son at bedside this morning. Denies any chest pain and breathing has improved after getting breathing treatments. Now on 1-2 L [place]. No recent BM and appetite is fair. Discussed the plan with patient and answered all questions.  Patient and son at bedside.  Objective:  Vital signs in last 24 hours: Vitals:   02/09/23 0806 02/09/23 0855 02/09/23 1131 02/09/23 1428  BP:  (!) 118/59 116/62   Pulse:  76 73 84  Resp:  16 18 17   Temp:  97.6 F (36.4 C)    TempSrc:  Oral    SpO2: 92% 95% 90% 98%  Weight:      Height:       Supplemental O2: HFNC SpO2: 98 % O2 Flow Rate (L/min): 1 L/min  Physical Exam:  Constitutional: Resting in bed, no acute distress, nasal cannula in place HENT: normocephalic atraumatic Cardiovascular: regular rate and rhythm, no m/r/g Pulmonary/Chest: Some bibasilar crackles appreciated.  No wheezing appreciated.  No increased work of breathing on 1 L nasal cannula today.  Extremities: Trace pitting edema appreciated  Filed Weights   02/05/23 0531 02/06/23 0400 02/07/23 0437  Weight: 83.5 kg 84.8 kg 89.1 kg     Intake/Output Summary (Last 24 hours) at 02/09/2023 1442 Last data filed at 02/09/2023 0532 Gross per 24 hour  Intake 364 ml  Output 1800 ml  Net -1436 ml   Net IO Since Admission: -5,650 mL [02/09/23 1442]  Pertinent Labs:    Latest Ref Rng & Units 02/06/2023    1:13 AM 02/05/2023    4:29 AM 02/04/2023   11:52 AM  CBC  WBC 4.0 - 10.5 K/uL 10.7  11.0  8.9   Hemoglobin 12.0 - 15.0 g/dL 09.8  11.9  14.7   Hematocrit 36.0 - 46.0 % 36.7  37.7  40.0   Platelets 150 - 400 K/uL  316  312  340        Latest Ref Rng & Units 02/09/2023   12:16 AM 02/08/2023   11:02 AM 02/08/2023   12:56 AM  CMP  Glucose 70 - 99 mg/dL 829  562  130   BUN 8 - 23 mg/dL 69  62  63   Creatinine 0.44 - 1.00 mg/dL 8.65  7.84  6.96   Sodium 135 - 145 mmol/L 134  131  131   Potassium 3.5 - 5.1 mmol/L 3.6  4.2  4.1   Chloride 98 - 111 mmol/L 83  90  89   CO2 22 - 32 mmol/L 35  29  30   Calcium 8.9 - 10.3 mg/dL 9.4  9.0  9.2     Imaging: No results found.  Assessment/Plan:   Principal Problem:   Acute hypoxic respiratory failure (HCC) Active Problems:   Type 2 diabetes mellitus with complication, without long-term current use of insulin (HCC)   Congestive heart failure (CHF) (HCC)   Smoking   Dyslipidemia   Hyponatremia   Elevated troponin   Acute on chronic hypoxic respiratory failure (HCC)   COPD exacerbation (HCC)   Acute on chronic HFrEF (heart failure with reduced ejection fraction) (HCC)   AKI (acute  kidney injury) [facility])   Patient Summary: Angela Lucero is a 82 y.o. female with a past medical history of HFrEF, hypertension, type 2 diabetes who presents to the emergency department with concerns of shortness of breath.  Patient admitted for further evaluation and management of acute hypoxic respiratory failure.   #Acute hypoxic respiratory failure #Acute on chronic HFrEF (Ischemic cardiomyopathy) #CAD status post stenting #Severe mitral regurgitation Patient down to 1 L nasal cannula.  Patient doing well today.  She denies any shortness of breath.  On exam, I do not appreciate any wheezing.  Patient is down close to 6 L during hospitalization.  From a cardiology standpoint, patient is diuresed well.  Will  switch to p.o. Lasix today -Continue home metoprolol 25 mg daily -Continue Imdur 60 mg daily -Continue hydralazine 10 mg 3 times daily  -Start Lasix p.o. 40 mg twice daily -Cardiology following, appreciate recs -Will hold Entresto -Continue Jardiance 25 mg  daily -Strict I's and O's, daily weights  -Palliative care following  #COPD exacerbation Patient has completed prednisone course.  Patient not short of breath my exam.  Expiratory wheezing absent on exam.  Patient is now weaned down to 1 L nasal cannula. -Start Anoro Ellipta -Discontinue incruse, discontinue home spiriva -Continue DuoNebs -Continue wean oxygen as tolerated -Plan to discharge to CIR tomorrow   #Constipation Patient has not had a bowel movement.  Will increase bowel regimen today.   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  Sodium improved to 134 today.  Patient out about 6 L during hospitalization. -Continue to monitor BMP -Hold home Entresto   #Type 2 diabetes mellitus Glucose measuring well.  Most recent A1c 6.0. -Continue monitoring BMP -Continue home Jardiance 25 mg daily   Diet: Heart Healthy IVF: None,None VTE: Enoxaparin Code: DNR/DNI PT/OT recs: Pending, none.  Dispo: Anticipated discharge to Rehab in 2 days pending medical improvement.   Modena Slater DO Internal Medicine Resident PGY-1 [phone] Please contact the on call pager after 5 pm and on weekends at [phone].

## 2023-02-09 NOTE — Consult Note (Signed)
   [facility] CM Inpatient Consult   02/09/2023  Angela Lucero 12-22-1940 [id number]  [facility] [[facility]]  Accountable Care Organization [ACO] Patient:  Medicare ACO REACH  Primary Care Provider:  [name], MD with [facility]   Patient screened for hospitalization with noted medium risk score for unplanned readmission risk to assess for potential [facility]  [[facility]] Care Management service needs for post hospital transition for care coordination.  Review of patient's electronic medical record reveals patient is being considered for an inpatient rehabilitation admission.   Plan:  Continue to follow progress and disposition to assess for post hospital community care coordination/management needs.  Referral request for community care coordination: continue to follow  Of note, [facility] Care Management/Population Health does not replace or interfere with any arrangements made by the Inpatient Transition of Care team.  For questions contact:   Charlesetta Shanks, RN BSN CCM [facility]  [phone] business mobile phone Toll free office [phone]  *Concierge Line  [phone] Fax number: [phone] [email or web address]

## 2023-02-09 NOTE — Progress Notes (Signed)
Inpatient Rehab Admissions Coordinator:   Note transition to PO lasix today.  Will follow for potential admit to CIR in the next day or so pending clearance from medical team and bed availability.   Estill Dooms, PT, DPT Admissions Coordinator [phone] 02/09/23  12:37 PM

## 2023-02-09 NOTE — Progress Notes (Signed)
   Heart Failure Stewardship Pharmacist Progress Note   PCP: [name], MD PCP-Cardiologist: Gypsy Balsam, MD    HPI:  82 yo F with PMH of CHF, CAD, COPD, T2DM, HLD and HTN.   Admitted in 11/2021 with NSTEMI and acute systolic HF. Cath showed severe single vessel CAD with chronic total occlusion of mid LAD with R>L and L>L collaterals. Medical therapy recommended. ECHO showed LVEF 30-35%, RV normal, mild to moderate MR. cMRI done and showed subendocardial late gadolinium enhancement consistent with prior infarct in LAD. LGE >50% suggesting nonviability in this territory. LVEF 24%, RV normal, moderate MR.  Presented to the ED on 6/7 with shortness of breath, orthopnea, and chest discomfort. Has chronic LE edema but this was overall unchanged. BNP elevated, trop 147. CXR with bibasilar opacities representing atelectasis or infection. ECHO 6/8 showed LVEF reduced to 20-25%, global hypokinesis, RV normal, moderately elevated PA pressure, severe MR, RAP 15 mmHg.   Possible CIR at discharge.   Current HF Medications: Diuretic: furosemide 40 mg PO BID Beta Blocker: metoprolol XL 25 mg daily SGLT2i: Jardiance 25 mg daily Other: hydralazine 10 mg TID + Imdur 60 mg daily  Prior to admission HF Medications: Diuretic: furosemide 60 mg daily Beta blocker: metoprolol XL 25 mg daily ACE/ARB/ARNI: Entresto 24/26 mg BID SGLT2i: Jardiance 25 mg daily  Pertinent Lab Values: Serum creatinine 1.45, BUN 69, Potassium 3.6, Sodium 134, BNP 3951.4, Magnesium 2.2, A1c 6.0   Vital Signs: Weight: 196 lbs - bed weight (admission weight: 182 lbs) Blood pressure: 110-120/60s  Heart rate: 70-80s  I/O: -1.1L yesterday; net -5.7L  Medication Assistance / Insurance Benefits Check: Does the patient have prescription insurance?  Yes Type of insurance plan: [employer]  Outpatient Pharmacy:  [facility] Is the patient willing to use [facility] TOC pharmacy at discharge? Yes Is the  patient willing to transition their outpatient pharmacy to utilize a [facility] outpatient pharmacy?   Pending    Assessment: 1. Acute on chronic systolic CHF (LVEF 20-25%), due to ICM. NYHA class III symptoms. - Agree with transitioning to furosemide 40 mg PO BID. Strict I/Os and daily weights. Encourage standing weights for better accuracy. Keep K>4 and Mg>2. - Continue metoprolol XL 25 mg daily - Entresto was on hold for AKI, consider restarting prior to discharge pending creatinine trends and BP - Continue Jardiance 25 mg daily - Continue hydralazine 10 mg TID and Imdur 60 mg daily   Plan: 1) Medication changes recommended at this time: - Agree with changes  2) Patient assistance: - Has insurance through the [place]  3)  Education  - Patient has been educated on current HF medications and potential additions to HF medication regimen - Patient verbalizes understanding that over the next few months, these medication doses may change and more medications may be added to optimize HF regimen - Patient has been educated on basic disease state pathophysiology and goals of therapy   Sharen Hones, PharmD, BCPS Heart Failure Engineer, building services Phone [phone]

## 2023-02-09 NOTE — Progress Notes (Signed)
Palliative Medicine Progress Note   Patient Name: Angela Lucero       Date: 02/09/2023 DOB: [birth year]  Age: 82 y.o. MRN#: [id number] Attending Physician: Earl Lagos, MD Primary Care Physician: [name], MD Admit Date: 02/04/2023   HPI/Patient Profile: 82 y.o. female  with past medical history of HFrEF, COPD, CAD s/p stenting, CVA, type 2 diabetes, and HTN who presented to the ED on 02/04/2023 with shortness of breath.  She was admitted for further evaluation and management of acute hypoxic respiratory failure secondary to acute on chronic HFrEF.   Palliative Medicine was consulted for goals of care.   Subjective: Chart reviewed.  Note that EKG did not show any evidence of acute ischemia and troponin did not significantly elevate.  Per cardiology, ongoing hypoxemia is likely more primary pulmonary and the elevated troponin indicates demand ischemia.  Bedside visit.  Patient continues to endorse shortness of breath, but does report feeling much better than when I saw her yesterday during her episode of chest pain.  I spoke with son by phone.  Provided a brief medical update.  Confirmed plan is for CIR once patient is medically stable.  Continued conversation had regarding outpatient palliative versus hospice when patient ultimately returns home.  Son indicates he and his mother both feel that hospice will provide more support and is also consistent with the overall goals of care.   Objective:  Physical Exam Vitals reviewed.  Constitutional:      General: She is not in acute distress.    Comments: Chronically ill-appearing  Pulmonary:     Effort: No respiratory distress.  Neurological:     Mental Status: She is alert and oriented to person, place, and time.               Palliative  Medicine Assessment & Plan   Assessment: Principal Problem:   Acute hypoxic respiratory failure (HCC) Active Problems:   Type 2 diabetes mellitus with complication, without long-term current use of insulin (HCC)   Congestive heart failure (CHF) (HCC)   Smoking   Dyslipidemia   Hyponatremia   Elevated troponin   Acute on chronic hypoxic respiratory failure (HCC)   COPD exacerbation (HCC)   Acute on chronic HFrEF (heart failure with reduced ejection fraction) (HCC)   AKI (acute kidney injury) (HCC)    Recommendations/Plan:  Continue current plan of care Plan is for CIR once medically stable PMT will continue to follow and support as needed   Code Status: DNR/DNI  Prognosis: Difficult to determine, but less than 6 months would not be surprising    Thank you for allowing the Palliative Medicine Team to assist in the care of this patient.   MDM - moderate   Merry Proud, NP   Please contact Palliative Medicine Team phone at [phone] for questions and concerns.  For individual providers, please see AMION.

## 2023-02-09 NOTE — Progress Notes (Signed)
Progress Note  Patient Name: Angela Lucero Date of Encounter: 02/09/2023  Primary Cardiologist:   Gypsy Balsam, MD   Subjective   Breathing better this morning.  No pain.   Inpatient Medications    Scheduled Meds:  aspirin EC  81 mg Oral Daily   atorvastatin  80 mg Oral Daily   empagliflozin  25 mg Oral Daily   enoxaparin (LOVENOX) injection  40 mg Subcutaneous Q24H   feeding supplement  237 mL Oral BID BM   hydrALAZINE  10 mg Oral Q8H   ipratropium-albuterol  3 mL Nebulization TID   isosorbide mononitrate  60 mg Oral Daily   melatonin  5 mg Oral QHS   metoprolol succinate  25 mg Oral Daily   umeclidinium bromide  1 puff Inhalation Daily   Continuous Infusions:  furosemide Stopped (02/08/23 2013)   PRN Meds: acetaminophen, albuterol, nitroGLYCERIN, ondansetron (ZOFRAN) IV, mouth rinse, polyethylene glycol, simethicone, traMADol   Vital Signs    Vitals:   02/08/23 2104 02/09/23 0003 02/09/23 0529 02/09/23 0530  BP: 124/64 127/65 112/65 112/65  Pulse:  90  86  Resp:  17  16  Temp:  97.6 F (36.4 C)  97.6 F (36.4 C)  TempSrc:  Oral  Oral  SpO2:  95%  100%  Weight:      Height:        Intake/Output Summary (Last 24 hours) at 02/09/2023 0759 Last data filed at 02/09/2023 0532 Gross per 24 hour  Intake 364 ml  Output 1800 ml  Net -1436 ml   Filed Weights   02/05/23 0531 02/06/23 0400 02/07/23 0437  Weight: 83.5 kg 84.8 kg 89.1 kg    Telemetry    NSR, with PVCs  - Personally Reviewed  ECG    NA - Personally Reviewed  Physical Exam   GEN: No  acute distress.   Neck: No  JVD Cardiac: RRR, no murmurs, rubs, or gallops.  Respiratory:     Decreased breath sounds with few scattered wheezes.  GI: Soft, nontender, non-distended, normal bowel sounds  MS:  No edema; No deformity. Neuro:   Nonfocal  Psych: Oriented and appropriate   Labs    Chemistry Recent Labs  Lab 02/04/23 1152 02/04/23 1746 02/08/23 0056 02/08/23 1102 02/09/23 0016  NA  122*   < > 131* 131* 134*  K 5.0   < > 4.1 4.2 3.6  CL 88*   < > 89* 90* 83*  CO2 21*   < > 30 29 35*  GLUCOSE 43*   < > 162* 159* 159*  BUN 45*   < > 63* 62* 69*  CREATININE 1.89*   < > 1.42* 1.35* 1.45*  CALCIUM 9.2   < > 9.2 9.0 9.4  PROT 6.8  --   --   --   --   ALBUMIN 3.3*  --   --   --   --   AST 56*  --   --   --   --   ALT 35  --   --   --   --   ALKPHOS 33*  --   --   --   --   BILITOT 0.8  --   --   --   --   GFRNONAA 27*   < > 37* 40* 36*  ANIONGAP 13   < > 12 12 16*   < > = values in this interval not displayed.     Hematology Recent Labs  Lab 02/04/23 1152 02/05/23 0429 02/06/23 0113  WBC 8.9 11.0* 10.7*  RBC 4.26 4.22 4.07  HGB 13.2 12.8 12.3  HCT 40.0 37.7 36.7  MCV 93.9 89.3 90.2  MCH 31.0 30.3 30.2  MCHC 33.0 34.0 33.5  RDW 14.6 14.6 14.6  PLT 340 312 316    Cardiac EnzymesNo results for input(s): "TROPONINI" in the last 168 hours. No results for input(s): "TROPIPOC" in the last 168 hours.   BNP Recent Labs  Lab 02/04/23 1152  BNP 3,951.4*     DDimer No results for input(s): "DDIMER" in the last 168 hours.   Radiology    No results found.  Cardiac Studies   Echo:  1. Left ventricular ejection fraction, by estimation, is 20 to 25%. The  left ventricle has severely decreased function. The left ventricle  demonstrates global hypokinesis. The left ventricular internal cavity size  was mildly dilated. Indeterminate  diastolic filling due to E-A fusion.   2. Right ventricular systolic function is normal. The right ventricular  size is mildly enlarged. There is moderately elevated pulmonary artery  systolic pressure. The estimated right ventricular systolic pressure is  49.1 mmHg.   3. Left atrial size was severely dilated.   4. Right atrial size was mildly dilated.   5. The mitral valve is abnormal. Severe mitral valve regurgitation. No  evidence of mitral stenosis.   6. The aortic valve is tricuspid. Aortic valve regurgitation is not   visualized. Aortic valve sclerosis/calcification is present, without any  evidence of aortic stenosis.   7. The inferior vena cava is dilated in size with <50% respiratory  variability, suggesting right atrial pressure of 15 mmHg.    Patient Profile     82 y.o. female with systolic heart failure (EF 24%), CAD, CTO of mid LAD with collaterals, diabetes, CKD IIIb admitted on 02/04/2023 for acute on chronic systolic heart failure.   Assessment & Plan     Acute on chronic systolic heart failure, EF 20-25%:   Net negative 5.7 liters.  Gave a dose of Zaroxolyn yesterday.    Today I will suggest changing to PO Lasix.     CAD/CTO of mid LAD (nonviable):    Medical management.  Elevated trop indicative of demand ischemia.    No further ischemia work up.  Trop checked twice yesterday and mildly elevated/flat.   No new testing or changes in therapy are suggested by these results.   Hypervolemic hyponatremia:   Na improved.   CKD stage IIIb:    Creat is up a little today.  Will reduce Lasix as above.     For questions or updates, please contact [facility] Please consult [email or web address] for contact info under Cardiology/STEMI.   Signed, Rollene Rotunda, MD  02/09/2023, 7:59 AM

## 2023-02-10 ENCOUNTER — Inpatient Hospital Stay (HOSPITAL_COMMUNITY)
Admission: RE | Admit: 2023-02-10 | Discharge: 2023-02-25 | DRG: 945 | Disposition: A | Discharge status: Home / Self Care | Payer: Medicare Other | Source: Intra-hospital | Attending: Physical Medicine and Rehabilitation | Admitting: Physical Medicine and Rehabilitation

## 2023-02-10 ENCOUNTER — Inpatient Hospital Stay (HOSPITAL_COMMUNITY): Payer: Medicare Other

## 2023-02-10 ENCOUNTER — Other Ambulatory Visit (HOSPITAL_COMMUNITY): Payer: Self-pay

## 2023-02-10 ENCOUNTER — Encounter (HOSPITAL_COMMUNITY): Payer: Self-pay | Admitting: Physical Medicine and Rehabilitation

## 2023-02-10 ENCOUNTER — Other Ambulatory Visit: Payer: Self-pay

## 2023-02-10 DIAGNOSIS — I2582 Chronic total occlusion of coronary artery: Secondary | ICD-10-CM | POA: Diagnosis present

## 2023-02-10 DIAGNOSIS — Z7984 Long term (current) use of oral hypoglycemic drugs: Secondary | ICD-10-CM

## 2023-02-10 DIAGNOSIS — E1122 Type 2 diabetes mellitus with diabetic chronic kidney disease: Secondary | ICD-10-CM | POA: Diagnosis present

## 2023-02-10 DIAGNOSIS — Z66 Do not resuscitate: Secondary | ICD-10-CM | POA: Diagnosis not present

## 2023-02-10 DIAGNOSIS — I959 Hypotension, unspecified: Secondary | ICD-10-CM | POA: Diagnosis not present

## 2023-02-10 DIAGNOSIS — R079 Chest pain, unspecified: Secondary | ICD-10-CM | POA: Diagnosis not present

## 2023-02-10 DIAGNOSIS — D649 Anemia, unspecified: Secondary | ICD-10-CM

## 2023-02-10 DIAGNOSIS — N1832 Chronic kidney disease, stage 3b: Secondary | ICD-10-CM | POA: Diagnosis present

## 2023-02-10 DIAGNOSIS — F17210 Nicotine dependence, cigarettes, uncomplicated: Secondary | ICD-10-CM | POA: Diagnosis present

## 2023-02-10 DIAGNOSIS — F54 Psychological and behavioral factors associated with disorders or diseases classified elsewhere: Secondary | ICD-10-CM

## 2023-02-10 DIAGNOSIS — Z8249 Family history of ischemic heart disease and other diseases of the circulatory system: Secondary | ICD-10-CM

## 2023-02-10 DIAGNOSIS — I255 Ischemic cardiomyopathy: Secondary | ICD-10-CM | POA: Diagnosis present

## 2023-02-10 DIAGNOSIS — R0682 Tachypnea, not elsewhere classified: Secondary | ICD-10-CM | POA: Diagnosis not present

## 2023-02-10 DIAGNOSIS — Z952 Presence of prosthetic heart valve: Secondary | ICD-10-CM | POA: Diagnosis not present

## 2023-02-10 DIAGNOSIS — Z9049 Acquired absence of other specified parts of digestive tract: Secondary | ICD-10-CM

## 2023-02-10 DIAGNOSIS — J441 Chronic obstructive pulmonary disease with (acute) exacerbation: Secondary | ICD-10-CM | POA: Diagnosis present

## 2023-02-10 DIAGNOSIS — E781 Pure hyperglyceridemia: Secondary | ICD-10-CM | POA: Diagnosis present

## 2023-02-10 DIAGNOSIS — Z716 Tobacco abuse counseling: Secondary | ICD-10-CM

## 2023-02-10 DIAGNOSIS — M1990 Unspecified osteoarthritis, unspecified site: Secondary | ICD-10-CM | POA: Diagnosis present

## 2023-02-10 DIAGNOSIS — Z955 Presence of coronary angioplasty implant and graft: Secondary | ICD-10-CM

## 2023-02-10 DIAGNOSIS — I130 Hypertensive heart and chronic kidney disease with heart failure and stage 1 through stage 4 chronic kidney disease, or unspecified chronic kidney disease: Secondary | ICD-10-CM | POA: Diagnosis present

## 2023-02-10 DIAGNOSIS — I2510 Atherosclerotic heart disease of native coronary artery without angina pectoris: Secondary | ICD-10-CM | POA: Diagnosis present

## 2023-02-10 DIAGNOSIS — I5042 Chronic combined systolic (congestive) and diastolic (congestive) heart failure: Secondary | ICD-10-CM | POA: Diagnosis present

## 2023-02-10 DIAGNOSIS — I5020 Unspecified systolic (congestive) heart failure: Secondary | ICD-10-CM | POA: Diagnosis not present

## 2023-02-10 DIAGNOSIS — M109 Gout, unspecified: Secondary | ICD-10-CM | POA: Diagnosis present

## 2023-02-10 DIAGNOSIS — I5023 Acute on chronic systolic (congestive) heart failure: Secondary | ICD-10-CM | POA: Diagnosis not present

## 2023-02-10 DIAGNOSIS — I452 Bifascicular block: Secondary | ICD-10-CM | POA: Diagnosis present

## 2023-02-10 DIAGNOSIS — E871 Hypo-osmolality and hyponatremia: Secondary | ICD-10-CM | POA: Diagnosis present

## 2023-02-10 DIAGNOSIS — J9601 Acute respiratory failure with hypoxia: Secondary | ICD-10-CM | POA: Diagnosis not present

## 2023-02-10 DIAGNOSIS — K5901 Slow transit constipation: Secondary | ICD-10-CM | POA: Diagnosis not present

## 2023-02-10 DIAGNOSIS — R339 Retention of urine, unspecified: Secondary | ICD-10-CM | POA: Diagnosis not present

## 2023-02-10 DIAGNOSIS — Z79899 Other long term (current) drug therapy: Secondary | ICD-10-CM

## 2023-02-10 DIAGNOSIS — N179 Acute kidney failure, unspecified: Secondary | ICD-10-CM | POA: Diagnosis present

## 2023-02-10 DIAGNOSIS — Z7982 Long term (current) use of aspirin: Secondary | ICD-10-CM

## 2023-02-10 DIAGNOSIS — K921 Melena: Secondary | ICD-10-CM | POA: Diagnosis not present

## 2023-02-10 DIAGNOSIS — K5900 Constipation, unspecified: Secondary | ICD-10-CM | POA: Diagnosis present

## 2023-02-10 DIAGNOSIS — R5381 Other malaise: Principal | ICD-10-CM | POA: Diagnosis present

## 2023-02-10 DIAGNOSIS — Z888 Allergy status to other drugs, medicaments and biological substances status: Secondary | ICD-10-CM

## 2023-02-10 DIAGNOSIS — I252 Old myocardial infarction: Secondary | ICD-10-CM

## 2023-02-10 DIAGNOSIS — Z8673 Personal history of transient ischemic attack (TIA), and cerebral infarction without residual deficits: Secondary | ICD-10-CM

## 2023-02-10 DIAGNOSIS — Z90711 Acquired absence of uterus with remaining cervical stump: Secondary | ICD-10-CM

## 2023-02-10 DIAGNOSIS — D631 Anemia in chronic kidney disease: Secondary | ICD-10-CM | POA: Diagnosis present

## 2023-02-10 DIAGNOSIS — I080 Rheumatic disorders of both mitral and aortic valves: Secondary | ICD-10-CM | POA: Diagnosis present

## 2023-02-10 DIAGNOSIS — R627 Adult failure to thrive: Secondary | ICD-10-CM | POA: Diagnosis present

## 2023-02-10 DIAGNOSIS — F419 Anxiety disorder, unspecified: Secondary | ICD-10-CM | POA: Diagnosis present

## 2023-02-10 HISTORY — DX: Other malaise: R53.81

## 2023-02-10 LAB — BASIC METABOLIC PANEL
Anion gap: 11 (ref 5–15)
BUN: 79 mg/dL — ABNORMAL HIGH (ref 8–23)
CO2: 33 mmol/L — ABNORMAL HIGH (ref 22–32)
Calcium: 9.2 mg/dL (ref 8.9–10.3)
Chloride: 88 mmol/L — ABNORMAL LOW (ref 98–111)
Creatinine, Ser: 1.26 mg/dL — ABNORMAL HIGH (ref 0.44–1.00)
GFR, Estimated: 43 mL/min — ABNORMAL LOW (ref >60)
Glucose, Bld: 117 mg/dL — ABNORMAL HIGH (ref 70–99)
Potassium: 3.8 mmol/L (ref 3.5–5.1)
Sodium: 132 mmol/L — ABNORMAL LOW (ref 135–145)

## 2023-02-10 LAB — GLUCOSE, CAPILLARY
Glucose-Capillary: 137 mg/dL — ABNORMAL HIGH (ref 70–99)
Glucose-Capillary: 138 mg/dL — ABNORMAL HIGH (ref 70–99)

## 2023-02-10 MED ORDER — IPRATROPIUM-ALBUTEROL 0.5-2.5 (3) MG/3ML IN SOLN: 3.0000 mL | Freq: Three times a day (TID) | RESPIRATORY_TRACT | Status: DC

## 2023-02-10 MED ORDER — ENSURE ENLIVE PO LIQD
237.0000 mL | Freq: Two times a day (BID) | ORAL | Status: DC
  Administered 2023-02-11 – 2023-02-22 (×16): 237 mL via ORAL

## 2023-02-10 MED ORDER — TRAMADOL HCL 50 MG PO TABS
50.0000 mg | ORAL_TABLET | Freq: Four times a day (QID) | ORAL | Status: DC | PRN
  Administered 2023-02-10 – 2023-02-24 (×28): 50 mg via ORAL
  Filled 2023-02-10 (×30): qty 1

## 2023-02-10 MED ORDER — LOSARTAN POTASSIUM 25 MG PO TABS
12.5000 mg | ORAL_TABLET | Freq: Every day | ORAL | Status: DC
  Administered 2023-02-10: 12.5 mg via ORAL
  Filled 2023-02-10: qty 1

## 2023-02-10 MED ORDER — FUROSEMIDE 40 MG PO TABS
40.0000 mg | ORAL_TABLET | Freq: Two times a day (BID) | ORAL | Status: DC
  Administered 2023-02-10 – 2023-02-25 (×30): 40 mg via ORAL
  Filled 2023-02-10 (×30): qty 1

## 2023-02-10 MED ORDER — FUROSEMIDE 40 MG PO TABS: 40.0000 mg | ORAL_TABLET | Freq: Two times a day (BID) | ORAL | 0 refills | Status: DC

## 2023-02-10 MED ORDER — SENNOSIDES-DOCUSATE SODIUM 8.6-50 MG PO TABS
2.0000 | ORAL_TABLET | Freq: Two times a day (BID) | ORAL | Status: DC
  Administered 2023-02-10 – 2023-02-21 (×20): 2 via ORAL
  Filled 2023-02-10 (×23): qty 2

## 2023-02-10 MED ORDER — UMECLIDINIUM-VILANTEROL 62.5-25 MCG/ACT IN AEPB: 1.0000 | INHALATION_SPRAY | Freq: Every day | RESPIRATORY_TRACT | 0 refills | Status: DC

## 2023-02-10 MED ORDER — SORBITOL 70 % SOLN
30.0000 mL | Freq: Every day | Status: DC | PRN
  Administered 2023-02-10: 30 mL via ORAL
  Filled 2023-02-10 (×2): qty 30

## 2023-02-10 MED ORDER — LOSARTAN POTASSIUM 25 MG PO TABS
12.5000 mg | ORAL_TABLET | Freq: Every day | ORAL | Status: DC
  Administered 2023-02-11 – 2023-02-25 (×15): 12.5 mg via ORAL
  Filled 2023-02-10 (×15): qty 1

## 2023-02-10 MED ORDER — ENOXAPARIN SODIUM 40 MG/0.4ML IJ SOSY
40.0000 mg | PREFILLED_SYRINGE | INTRAMUSCULAR | Status: DC
  Administered 2023-02-10 – 2023-02-12 (×3): 40 mg via SUBCUTANEOUS
  Filled 2023-02-10 (×3): qty 0.4

## 2023-02-10 MED ORDER — MELATONIN 5 MG PO TABS
5.0000 mg | ORAL_TABLET | Freq: Every day | ORAL | Status: DC
  Administered 2023-02-10 – 2023-02-24 (×15): 5 mg via ORAL
  Filled 2023-02-10 (×16): qty 1

## 2023-02-10 MED ORDER — SENNOSIDES-DOCUSATE SODIUM 8.6-50 MG PO TABS: 2.0000 | ORAL_TABLET | Freq: Two times a day (BID) | ORAL | Status: DC

## 2023-02-10 MED ORDER — UMECLIDINIUM-VILANTEROL 62.5-25 MCG/ACT IN AEPB
1.0000 | INHALATION_SPRAY | Freq: Every day | RESPIRATORY_TRACT | Status: DC
  Administered 2023-02-11 – 2023-02-25 (×12): 1 via RESPIRATORY_TRACT
  Filled 2023-02-10: qty 14

## 2023-02-10 MED ORDER — POLYETHYLENE GLYCOL 3350 17 GM/SCOOP PO POWD
17.0000 g | Freq: Every day | ORAL | 0 refills | Status: DC | PRN
  Filled 2023-02-10: qty 238, 14d supply, fill #0

## 2023-02-10 MED ORDER — IPRATROPIUM-ALBUTEROL 0.5-2.5 (3) MG/3ML IN SOLN
3.0000 mL | Freq: Three times a day (TID) | RESPIRATORY_TRACT | Status: DC
  Administered 2023-02-10 – 2023-02-13 (×8): 3 mL via RESPIRATORY_TRACT
  Filled 2023-02-10 (×4): qty 3
  Filled 2023-02-10: qty 9
  Filled 2023-02-10 (×5): qty 3

## 2023-02-10 MED ORDER — METOPROLOL SUCCINATE ER 25 MG PO TB24
25.0000 mg | ORAL_TABLET | Freq: Every day | ORAL | Status: DC
  Administered 2023-02-11 – 2023-02-25 (×15): 25 mg via ORAL
  Filled 2023-02-10 (×15): qty 1

## 2023-02-10 MED ORDER — ENOXAPARIN SODIUM 40 MG/0.4ML IJ SOSY: 40.0000 mg | PREFILLED_SYRINGE | INTRAMUSCULAR | Status: DC

## 2023-02-10 MED ORDER — HYDRALAZINE HCL 10 MG PO TABS
10.0000 mg | ORAL_TABLET | Freq: Three times a day (TID) | ORAL | Status: DC
  Administered 2023-02-10 – 2023-02-24 (×34): 10 mg via ORAL
  Filled 2023-02-10 (×42): qty 1

## 2023-02-10 MED ORDER — ASPIRIN 81 MG PO TBEC
81.0000 mg | DELAYED_RELEASE_TABLET | Freq: Every day | ORAL | Status: DC
  Administered 2023-02-11 – 2023-02-25 (×15): 81 mg via ORAL
  Filled 2023-02-10 (×15): qty 1

## 2023-02-10 MED ORDER — POLYETHYLENE GLYCOL 3350 17 G PO PACK: 17.0000 g | PACK | Freq: Every day | ORAL | Status: DC | PRN

## 2023-02-10 MED ORDER — INSULIN ASPART 100 UNIT/ML IJ SOLN: 0.0000 [IU] | Freq: Three times a day (TID) | INTRAMUSCULAR | Status: DC

## 2023-02-10 MED ORDER — ATORVASTATIN CALCIUM 80 MG PO TABS
80.0000 mg | ORAL_TABLET | Freq: Every day | ORAL | Status: DC
  Administered 2023-02-11 – 2023-02-25 (×15): 80 mg via ORAL
  Filled 2023-02-10 (×15): qty 1

## 2023-02-10 MED ORDER — SIMETHICONE 80 MG PO CHEW
80.0000 mg | CHEWABLE_TABLET | Freq: Four times a day (QID) | ORAL | Status: DC | PRN
  Administered 2023-02-10: 80 mg via ORAL
  Filled 2023-02-10 (×2): qty 1

## 2023-02-10 MED ORDER — DOCUSATE SODIUM 283 MG RE ENEM
1.0000 | ENEMA | Freq: Once | RECTAL | Status: AC
  Administered 2023-02-10: 283 mg via RECTAL
  Filled 2023-02-10: qty 1

## 2023-02-10 MED ORDER — ISOSORBIDE MONONITRATE ER 30 MG PO TB24
60.0000 mg | ORAL_TABLET | Freq: Every day | ORAL | Status: DC
  Administered 2023-02-11 – 2023-02-14 (×4): 60 mg via ORAL
  Filled 2023-02-10 (×4): qty 2

## 2023-02-10 MED ORDER — LOSARTAN POTASSIUM 25 MG PO TABS: 12.5000 mg | ORAL_TABLET | Freq: Every day | ORAL | 0 refills | Status: DC

## 2023-02-10 MED ORDER — ACETAMINOPHEN 325 MG PO TABS
325.0000 mg | ORAL_TABLET | ORAL | Status: DC | PRN
  Administered 2023-02-10 – 2023-02-13 (×5): 650 mg via ORAL
  Filled 2023-02-10 (×5): qty 2

## 2023-02-10 MED ORDER — ENSURE ENLIVE PO LIQD
237.0000 mL | Freq: Two times a day (BID) | ORAL | 12 refills | Status: DC
  Filled 2023-02-10: qty 237, 1d supply, fill #0

## 2023-02-10 MED ORDER — INSULIN ASPART 100 UNIT/ML IJ SOLN
0.0000 [IU] | Freq: Three times a day (TID) | INTRAMUSCULAR | Status: DC
  Administered 2023-02-10 – 2023-02-11 (×2): 1 [IU] via SUBCUTANEOUS
  Administered 2023-02-11 – 2023-02-12 (×2): 2 [IU] via SUBCUTANEOUS
  Administered 2023-02-12 – 2023-02-14 (×6): 1 [IU] via SUBCUTANEOUS
  Administered 2023-02-15: 2 [IU] via SUBCUTANEOUS
  Administered 2023-02-15: 1 [IU] via SUBCUTANEOUS
  Administered 2023-02-16 – 2023-02-17 (×4): 2 [IU] via SUBCUTANEOUS
  Administered 2023-02-18: 1 [IU] via SUBCUTANEOUS
  Administered 2023-02-18: 2 [IU] via SUBCUTANEOUS
  Administered 2023-02-18 – 2023-02-19 (×2): 1 [IU] via SUBCUTANEOUS
  Administered 2023-02-19 – 2023-02-20 (×4): 2 [IU] via SUBCUTANEOUS
  Administered 2023-02-21 – 2023-02-23 (×4): 1 [IU] via SUBCUTANEOUS
  Administered 2023-02-23: 3 [IU] via SUBCUTANEOUS
  Administered 2023-02-24: 1 [IU] via SUBCUTANEOUS
  Administered 2023-02-24 (×2): 2 [IU] via SUBCUTANEOUS

## 2023-02-10 MED ORDER — EMPAGLIFLOZIN 25 MG PO TABS
25.0000 mg | ORAL_TABLET | Freq: Every day | ORAL | Status: DC
  Administered 2023-02-11 – 2023-02-25 (×15): 25 mg via ORAL
  Filled 2023-02-10 (×15): qty 1

## 2023-02-10 MED ORDER — ALBUTEROL SULFATE (2.5 MG/3ML) 0.083% IN NEBU
2.5000 mg | INHALATION_SOLUTION | Freq: Four times a day (QID) | RESPIRATORY_TRACT | Status: DC | PRN
  Administered 2023-02-14 – 2023-02-20 (×3): 2.5 mg via RESPIRATORY_TRACT
  Filled 2023-02-10 (×3): qty 3

## 2023-02-10 MED ORDER — NITROGLYCERIN 0.4 MG SL SUBL
0.4000 mg | SUBLINGUAL_TABLET | SUBLINGUAL | Status: DC | PRN
  Administered 2023-02-14 – 2023-02-18 (×11): 0.4 mg via SUBLINGUAL
  Filled 2023-02-10 (×3): qty 1

## 2023-02-10 MED ORDER — HYDRALAZINE HCL 10 MG PO TABS: 10.0000 mg | ORAL_TABLET | Freq: Three times a day (TID) | ORAL | 0 refills | Status: DC

## 2023-02-10 NOTE — Plan of Care (Signed)

## 2023-02-10 NOTE — Progress Notes (Signed)
   Heart Failure Stewardship Pharmacist Progress Note   PCP: [name], MD PCP-Cardiologist: Gypsy Balsam, MD    HPI:  82 yo F with PMH of CHF, CAD, COPD, T2DM, HLD and HTN.   Admitted in 11/2021 with NSTEMI and acute systolic HF. Cath showed severe single vessel CAD with chronic total occlusion of mid LAD with R>L and L>L collaterals. Medical therapy recommended. ECHO showed LVEF 30-35%, RV normal, mild to moderate MR. cMRI done and showed subendocardial late gadolinium enhancement consistent with prior infarct in LAD. LGE >50% suggesting nonviability in this territory. LVEF 24%, RV normal, moderate MR.  Presented to the ED on 6/7 with shortness of breath, orthopnea, and chest discomfort. Has chronic LE edema but this was overall unchanged. BNP elevated, trop 147. CXR with bibasilar opacities representing atelectasis or infection. ECHO 6/8 showed LVEF reduced to 20-25%, global hypokinesis, RV normal, moderately elevated PA pressure, severe MR, RAP 15 mmHg.   Possible CIR at discharge.   Current HF Medications: Diuretic: furosemide 40 mg PO BID Beta Blocker: metoprolol XL 25 mg daily ACE/ARB/ARNI: losartan 12.5 mg daily SGLT2i: Jardiance 25 mg daily Other: hydralazine 10 mg TID + Imdur 60 mg daily  Prior to admission HF Medications: Diuretic: furosemide 60 mg daily Beta blocker: metoprolol XL 25 mg daily ACE/ARB/ARNI: Entresto 24/26 mg BID SGLT2i: Jardiance 25 mg daily  Pertinent Lab Values: Serum creatinine 1.26, BUN 79, Potassium 3.8, Sodium 132, BNP 3951.4, Magnesium 2.2, A1c 6.0   Vital Signs: Weight: 196 lbs (admission weight: 182 lbs) Blood pressure: 110-120/60s  Heart rate: 70-80s  I/O: -1.4L yesterday; net -6.9L  Medication Assistance / Insurance Benefits Check: Does the patient have prescription insurance?  Yes Type of insurance plan: [employer]  Outpatient Pharmacy:  [facility] Is the patient willing to [facility] TOC pharmacy at  discharge? Yes Is the patient willing to transition their outpatient pharmacy to utilize a [facility] outpatient pharmacy?   Pending    Assessment: 1. Acute on chronic systolic CHF (LVEF 20-25%), due to ICM. NYHA class II symptoms. - Continue furosemide 40 mg PO BID. Strict I/Os and daily weights. Keep K>4 and Mg>2. - Continue metoprolol XL 25 mg daily - Entresto was on hold for AKI, agree with starting losartan 12.5 mg daily since BP too soft for Entresto. May be able to add spironolactone 12.5 mg daily prior to discharge.  - Continue Jardiance 25 mg daily - Continue hydralazine 10 mg TID and Imdur 60 mg daily   Plan: 1) Medication changes recommended at this time: - Agree with changes; add spironolactone 12.5 mg daily tomorrow  2) Patient assistance: - Has insurance through the [place] - Possible CIR at discharge  3)  Education  - Patient has been educated on current HF medications and potential additions to HF medication regimen - Patient verbalizes understanding that over the next few months, these medication doses may change and more medications may be added to optimize HF regimen - Patient has been educated on basic disease state pathophysiology and goals of therapy   Sharen Hones, PharmD, BCPS Heart Failure Engineer, building services Phone [phone]

## 2023-02-10 NOTE — Progress Notes (Signed)
Inpatient Rehab Admissions Coordinator:   I have a bed available for this patient to admit to CIR today.  Dr. Allena Katz in agreement.  Will let pt/family and TOC team know.   Estill Dooms, PT, DPT Admissions Coordinator [phone] 02/10/23  9:53 AM

## 2023-02-10 NOTE — Progress Notes (Signed)
OT Cancellation Note  Patient Details Name: Angela Lucero MRN: [id number] DOB: April 14, 1941   Cancelled Treatment:    Reason Eval/Treat Not Completed: Fatigue/lethargy limiting ability to participate (Pt fatigued, slept poorly. Discharging to AIR today.)  Evern Bio 02/10/2023, 11:43 AM Berna Spare, OTR/L Acute Rehabilitation Services Office: [phone]

## 2023-02-10 NOTE — Progress Notes (Signed)
PMR Admission Coordinator Pre-Admission Assessment  Patient: Angela Lucero is an 81 y.o., female MRN: [id number] DOB: 05/11/1941 Height: 5' 6" (167.6 cm) Weight: 89.1 kg  Insurance Information HMO:     PPO:      PCP:      IPA:      80/20:      OTHER:  PRIMARY: medicare A/B      Policy#: [id number]       Subscriber: pt CM Name:       Phone#:      Fax#:  Pre-Cert#: verified online      Employer:  Benefits:  Phone #:      Name:  Eff. Date:  04/30/98 A/B     Deduct: $1632      Out of Pocket Max: n/a      Life Max: n/a CIR: 100%      SNF: 20 full days remaining Outpatient: 80%     Co-Pay: 20% Home Health: 100%      Co-Pay:  DME: 80%     Co-Pay: 20% Providers:  SECONDARY: [facility]      Policy#: [id number]     Phone#: [phone]  Financial Counselor:       Phone#:   The "Data Collection Information Summary" for patients in Inpatient Rehabilitation Facilities with attached "Privacy Act Statement-Health Care Records" was provided and verbally reviewed with: Patient and Family  Emergency Contact Information Contact Information     Name Relation Home Work Mobile   Pitts,Jeffrey Son   [phone]       Current Medical History  Patient Admitting Diagnosis: cardiac debility   History of Present Illness: Pt is an 81 y/o female with PMH of COPD with ongoing tobacco use, HFrEF, DM, CAD, CVA, and HTN admitted to [facility] on 02/04/23 with progressive shortness of breath, chest pain.  In ED pt endorses orthopnea, increase in chronic cough with yellow sputum production.  She was 80% on room air.  Labs notable for sodium 122, blood glucose 43, BUN 45, Creatinine 1.89, troponin 147.  She was admitted for CHF and COPD exacerbation.  CXR showed bibasilar infiltrates, EKG showed right BBB and sinus tach.  Viral panel negative for covid and flu.  Cardiology was consulted and recommended increasing lasix for diuresis, hold entresto, continue metoprolol, add Hydralazine.  Pt underwent TTE on 6/8 which estimated  EF to be 20-25% and noted severe mitral valve regurgitation.  Hospital course notable for managing diuresis in the setting of AKI, new need for oxygen (up to 3 L), prednisone taper for COPD exac, and palliative consult for GOC discussions.  Therapy ongoing and pt was recommended for CIR due to significant deficits in cardiovascular endurance.     Patient's medical record from [facility] has been reviewed by the rehabilitation admission coordinator and physician.  Past Medical History  Past Medical History:  Diagnosis Date   Arthritis 2010   Lower back and neck   CAD (coronary artery disease)    COPD (chronic obstructive pulmonary disease) (HCC)    CVA (cerebral vascular accident) (HCC)    1997, 2005 stents   Diabetes (HCC)    Diabetes mellitus, type II (HCC)    Gout 1998   Heart attack (HCC)    1997,2005   Hyperlipidemia    Hypertension    Hypertriglyceridemia     Has the patient had major surgery during 100 days prior to admission? No  Family History   family history includes Cancer in her brother; Heart disease   in her father and mother; Hypertension in her father and mother.  Current Medications  Current Facility-Administered Medications:    acetaminophen (TYLENOL) tablet 1,000 mg, 1,000 mg, Oral, Q6H PRN, McLendon, Michael, MD, 1,000 mg at 02/08/23 1350   albuterol (PROVENTIL) (2.5 MG/3ML) 0.083% nebulizer solution 2.5 mg, 2.5 mg, Nebulization, Q6H PRN, Machen, Julie, MD, 2.5 mg at 02/07/23 0508   aspirin EC tablet 81 mg, 81 mg, Oral, Daily, Patel, Amar, DO, 81 mg at 02/10/23 0811   atorvastatin (LIPITOR) tablet 80 mg, 80 mg, Oral, Daily, Patel, Amar, DO, 80 mg at 02/10/23 0811   empagliflozin (JARDIANCE) tablet 25 mg, 25 mg, Oral, Daily, Patel, Amar, DO, 25 mg at 02/10/23 0812   enoxaparin (LOVENOX) injection 40 mg, 40 mg, Subcutaneous, Q24H, Atway, Rayann N, DO, 40 mg at 02/09/23 1656   feeding supplement (ENSURE ENLIVE / ENSURE PLUS) liquid 237 mL, 237 mL, Oral, BID BM,  Patel, Amar, DO, 237 mL at 02/08/23 1517   furosemide (LASIX) tablet 40 mg, 40 mg, Oral, BID, Hochrein, James, MD, 40 mg at 02/10/23 0722   hydrALAZINE (APRESOLINE) tablet 10 mg, 10 mg, Oral, Q8H, O'Neal, [place] Thomas, MD, 10 mg at 02/10/23 0548   ipratropium-albuterol (DUONEB) 0.5-2.5 (3) MG/3ML nebulizer solution 3 mL, 3 mL, Nebulization, TID, Machen, Julie, MD, 3 mL at 02/10/23 0909   isosorbide mononitrate (IMDUR) 24 hr tablet 60 mg, 60 mg, Oral, Daily, Atway, Rayann N, DO, 60 mg at 02/10/23 0722   losartan (COZAAR) tablet 12.5 mg, 12.5 mg, Oral, Daily, Hochrein, James, MD, 12.5 mg at 02/10/23 0817   melatonin tablet 5 mg, 5 mg, Oral, QHS, McLendon, Michael, MD, 5 mg at 02/09/23 2324   metoprolol succinate (TOPROL-XL) 24 hr tablet 25 mg, 25 mg, Oral, Daily, O'Neal, [place] Thomas, MD, 25 mg at 02/10/23 0811   nitroGLYCERIN (NITROSTAT) SL tablet 0.4 mg, 0.4 mg, Sublingual, Q5 min PRN, Patel, Amar, DO, 0.4 mg at 02/07/23 1615   ondansetron (ZOFRAN) injection 4 mg, 4 mg, Intravenous, Q6H PRN, McIlquham, Julia B, NP, 4 mg at 02/07/23 2207   Oral care mouth rinse, 15 mL, Mouth Rinse, PRN, Machen, Julie, MD   polyethylene glycol (MIRALAX / GLYCOLAX) packet 17 g, 17 g, Oral, Daily PRN, Patel, Amar, DO, 17 g at 02/09/23 0901   senna-docusate (Senokot-S) tablet 2 tablet, 2 tablet, Oral, BID, Zheng, Michael, DO, 2 tablet at 02/10/23 0811   simethicone (MYLICON) chewable tablet 80 mg, 80 mg, Oral, QID PRN, Machen, Julie, MD, 80 mg at 02/08/23 2107   traMADol (ULTRAM) tablet 50 mg, 50 mg, Oral, Q6H PRN, Patel, Amar, DO, 50 mg at 02/10/23 0547   umeclidinium-vilanterol (ANORO ELLIPTA) 62.5-25 MCG/ACT 1 puff, 1 puff, Inhalation, Daily, Patel, Amar, DO, 1 puff at 02/10/23 0920  Patients Current Diet:  Diet Order             Diet Heart Room service appropriate? Yes; Fluid consistency: Thin  Diet effective now                   Precautions / Restrictions Precautions Precautions: Fall Precaution  Comments: watch sats, bone on bone L knee Restrictions Weight Bearing Restrictions: No   Has the patient had 2 or more falls or a fall with injury in the past year? No  Prior Activity Level Household: no device in the home, son reports "homebody", he or neighbor does errands for patient, not driving  Prior Functional Level Self Care: Did the patient need help bathing, dressing, using the   toilet or eating? Independent  Indoor Mobility: Did the patient need assistance with walking from room to room (with or without device)? Independent  Stairs: Did the patient need assistance with internal or external stairs (with or without device)? Independent  Functional Cognition: Did the patient need help planning regular tasks such as shopping or remembering to take medications? Independent  Patient Information Are you of Hispanic, Latino/a,or Spanish origin?: A. No, not of Hispanic, Latino/a, or Spanish origin What is your race?: A. White Do you need or want an interpreter to communicate with a doctor or health care staff?: 0. No  Patient's Response To:  Health Literacy and Transportation Is the patient able to respond to health literacy and transportation needs?: Yes Health Literacy - How often do you need to have someone help you when you read instructions, pamphlets, or other written material from your doctor or pharmacy?: Never In the past 12 months, has lack of transportation kept you from medical appointments or from getting medications?: No In the past 12 months, has lack of transportation kept you from meetings, work, or from getting things needed for daily living?: No  Home Assistive Devices / Equipment Home Assistive Devices/Equipment: Walker (specify type), Cane (specify quad or straight) (last two days using walker, pt uses cane outside on a daily basis) Home Equipment: Cane - single point, Rolling Walker (2 wheels)  Prior Device Use: Indicate devices/aids used by the patient prior  to current illness, exacerbation or injury? None of the above  Current Functional Level Cognition  Overall Cognitive Status: Within Functional Limits for tasks assessed Orientation Level: Oriented X4 General Comments: pt hesitant to seek medical attention as her condition became worse and she was needing to sleep in the couch to elevate her head    Extremity Assessment (includes Sensation/Coordination)  Upper Extremity Assessment: Overall WFL for tasks assessed  Lower Extremity Assessment: Defer to PT evaluation    ADLs  Overall ADL's : Needs assistance/impaired Eating/Feeding: Independent, Sitting Grooming: Set up, Sitting Upper Body Bathing: Set up, Sitting Lower Body Bathing: Minimal assistance, Sit to/from stand Upper Body Dressing : Set up, Sitting Lower Body Dressing: Minimal assistance, Sit to/from stand Toilet Transfer: Min guard, Stand-pivot, Rolling walker (2 wheels) Toileting- Clothing Manipulation and Hygiene: Minimal assistance, Sit to/from stand    Mobility  Overal bed mobility: Needs Assistance Bed Mobility: Supine to Sit Supine to sit: Min assist Sit to supine: Min assist General bed mobility comments: Assist to elevate trunk into sitting    Transfers  Overall transfer level: Needs assistance Equipment used: Rolling walker (2 wheels) Transfers: Sit to/from Stand, Bed to chair/wheelchair/BSC Sit to Stand: Min guard Bed to/from chair/wheelchair/BSC transfer type:: Step pivot Step pivot transfers: Min guard General transfer comment: Verbal cues for hand placement. Assist for safety    Ambulation / Gait / Stairs / Wheelchair Mobility  Ambulation/Gait Ambulation/Gait assistance: Min assist Gait Distance (Feet): 3 Feet Assistive device: Rolling walker (2 wheels) Gait Pattern/deviations: Step-through pattern, Decreased step length - right, Decreased step length - left, Shuffle, Trunk flexed General Gait Details: Assist for support and balance. Pt leans over and  props forearms on walker to rest. Pt fatigues quickly with activity Gait velocity: decr Gait velocity interpretation: <1.31 ft/sec, indicative of household ambulator    Posture / Balance Balance Overall balance assessment: Needs assistance Sitting-balance support: No upper extremity supported Sitting balance-Leahy Scale: Good Standing balance support: Bilateral upper extremity supported, During functional activity, Reliant on assistive device for balance Standing balance-Leahy Scale: Poor   Standing balance comment: walker and min guard for static standing    Special needs/care consideration Oxygen acutely, Diabetic management yes, and Special service needs palliative medicine   Previous Home Environment (from acute therapy documentation) Living Arrangements: Alone Available Help at Discharge: Neighbor, Available PRN/intermittently, Family Type of Home: Mobile home Home Layout: One level Home Access: Stairs to enter Entrance Stairs-Rails: Left Entrance Stairs-Number of Steps: 3 Bathroom Shower/Tub: Tub/shower unit Bathroom Toilet: Standard Bathroom Accessibility: No Home Care Services: No  Discharge Living Setting Plans for Discharge Living Setting: Patient's home (care from son, neighbor, and hired caregivers) Type of Home at Discharge: Mobile home Discharge Home Layout: One level Discharge Home Access: Stairs to enter Entrance Stairs-Rails: Right, Left Entrance Stairs-Number of Steps: 3 Discharge Bathroom Shower/Tub: Tub/shower unit Discharge Bathroom Toilet: Standard Discharge Bathroom Accessibility:  (unsure) Does the patient have any problems obtaining your medications?: No  Social/Family/Support Systems Anticipated Caregiver: son, Jeffrey Pitts will be primary contact for multiple caregivers Anticipated Caregiver's Contact Information: Jeff [phone] Ability/Limitations of Caregiver: none stated Caregiver Availability: 24/7 Discharge Plan Discussed with Primary  Caregiver: Yes Is Caregiver In Agreement with Plan?: Yes Does Caregiver/Family have Issues with Lodging/Transportation while Pt is in Rehab?: No  Goals Patient/Family Goal for Rehab: PT/OT supervision to mod I, SLP n/a Expected length of stay: 10-14 days Additional Information: Discharge plan: home to pt's house with 24/7 care from son, neighbor, and hired caregivers.  Likely transition to hospice at home at discharge given medical status with advanced pulmonary and cardiac disease Pt/Family Agrees to Admission and willing to participate: Yes Program Orientation Provided & Reviewed with Pt/Caregiver Including Roles  & Responsibilities: Yes Additional Information Needs: working with Palliative in the acute setting to monitor goals of care  Barriers to Discharge: Medical stability, Home environment access/layout  Decrease burden of Care through IP rehab admission: n/a  Possible need for SNF placement upon discharge: Not anticipated.  Discharge plan: return to patient's home, her son (jeff) her neighbor (chrissy) and hired caregivers will provide 24/7 care at discharge.  Plan for initiation of hospice services once d/c'd home.    Patient Condition: I have reviewed medical records from [facility], spoken with CM, and patient and son. I met with patient at the bedside and discussed via phone for inpatient rehabilitation assessment.  Patient will benefit from ongoing PT and OT, can actively participate in 3 hours of therapy a day 5 days of the week, and can make measurable gains during the admission.  Patient will also benefit from the coordinated team approach during an Inpatient Acute Rehabilitation admission.  The patient will receive intensive therapy as well as Rehabilitation physician, nursing, social worker, and care management interventions.  Due to safety, skin/wound care, disease management, medication administration, pain management, and patient education the patient requires 24 hour a day  rehabilitation nursing.  The patient is currently min assist with mobility and basic ADLs.  Discharge setting and therapy post discharge at home with home health is anticipated.  Patient has agreed to participate in the Acute Inpatient Rehabilitation Program and will admit today.  Preadmission Screen Completed By:  Claretha Townshend E Cristella Stiver, PT, DPT 02/10/2023 9:54 AM ______________________________________________________________________   Discussed status with Dr. Engler on 02/10/23  at 9:54 AM  and received approval for admission today.  Admission Coordinator:  Dillon Livermore E Talynn Lebon, PT, DPT time 9:54 AM /Date 02/10/23    Assessment/Plan: Diagnosis: Does the need for close, 24 hr/day Medical supervision in concert with the patient's rehab needs make   it unreasonable for this patient to be served in a less intensive setting? Yes Co-Morbidities requiring supervision/potential complications: acute on chronic HFrEF, hypoxemic respiratory failure w/ COPD exacerbation,  hypertension, DM2, AKI on CKD stage IIIb, hyponatremia, constipation and urinary incontinence Due to bladder management, bowel management, safety, skin/wound care, disease management, medication administration, pain management, and patient education, does the patient require 24 hr/day rehab nursing? Yes Does the patient require coordinated care of a physician, rehab nurse, PT, OT to address physical and functional deficits in the context of the above medical diagnosis(es)? Yes Addressing deficits in the following areas: balance, endurance, locomotion, strength, transferring, bowel/bladder control, bathing, dressing, feeding, grooming, and toileting Can the patient actively participate in an intensive therapy program of at least 3 hrs of therapy 5 days a week? Yes The potential for patient to make measurable gains while on inpatient rehab is good Anticipated functional outcomes upon discharge from inpatient rehab: supervision PT, supervision OT,   Estimated rehab length of stay to reach the above functional goals is: 10-14 days Anticipated discharge destination: Home 10. Overall Rehab/Functional Prognosis: good   MD Signature:  Morgan C Engler, DO 02/10/2023  

## 2023-02-10 NOTE — H&P (Signed)
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 Chart review patient lives alone.  1 level home 3 steps to entry.  Independent prior to admission.  Presented 02/04/2023 with  progressive shortness of breath as well as sharp acute chest pain.  Admission chemistries BNP 3951, troponin 147-136, sodium 122, chloride 88, glucose 43, BUN 45, creatinine 1.89, lactic acid within normal limits, globin A1c 6.0.  EKG did not show any evidence of acute ischemia.  Patient did receive 2 nitroglycerin tablets and chest pain subsided.  Chest x-ray hazy bibasilar airspace opacities representing atelectasis and/or infection.  Echo with ejection fraction of 20 to 25%.  Global hypokinesis..  Cardiology consulted for acute on chronic systolic congestive heart failure with conservative care and presently maintained on Jardiance as well as Lasix 40 mg twice daily as per heart failure team.  Sherryll Burger remains on hold for AKI considering restarting if creatinine improves.  Lovenox has been added for DVT prophylaxis.  Patient initially and oxygen demand and slowly has been weaned.  Mild elevation in creatinine to 1.81 felt to be related to diuresis and monitor closely.  Hyponatremia 122-126.  Palliative care consulted to establish goals of care.  Patient with reported constipation.  Therapy evaluations completed due to patient debility and decreased functional mobility was admitted for a comprehensive rehab program.   Review of Systems  Constitutional:  Negative for chills and fever.  HENT:  Negative for hearing  loss.   Eyes:  Negative for blurred vision and double vision.  Respiratory:  Positive for shortness of breath. Negative for wheezing.   Cardiovascular:  Positive for chest pain and leg swelling.  Gastrointestinal:  Positive for constipation. Negative for heartburn, nausea and vomiting.  Genitourinary:  Negative for dysuria and hematuria.  Musculoskeletal:  Positive for joint pain and myalgias.  Skin:  Negative for rash.  Psychiatric/Behavioral: Negative.    All other systems reviewed and are negative.       Past Medical History:  Diagnosis Date   Arthritis 2010    Lower back and neck   CAD (coronary artery disease)     COPD (chronic obstructive pulmonary disease) (HCC)     CVA (cerebral vascular accident) (HCC)      1997, 2005 stents   Diabetes (HCC)     Diabetes mellitus, type II (HCC)     Gout 1998   Heart attack (HCC)      [id number],9604   Hyperlipidemia     Hypertension     Hypertriglyceridemia           Past Surgical History:  Procedure Laterality Date   APPENDECTOMY   1971   CHOLECYSTECTOMY       LEFT HEART CATH AND CORONARY ANGIOGRAPHY N/A 12/14/2021    Procedure: LEFT HEART CATH AND CORONARY ANGIOGRAPHY;  Surgeon: Yvonne Kendall, MD;  Location: [facility];  Service: Cardiovascular;  Laterality: N/A;   Navel hernia repair   2007   PARTIAL HYSTERECTOMY   1976   Stent Heart       TUBAL LIGATION  1974   Ulna nerve transpost L arm             Family History  Problem Relation Age of Onset   Heart disease Mother     Hypertension Mother     Heart disease Father     Hypertension Father     Cancer Brother      Social History:  reports that she has been smoking cigarettes. She has been smoking an average of 1.5 packs per day. She has never used smokeless tobacco. She reports that she does not currently use alcohol. She reports that she does not use drugs. Allergies: No Known Allergies       Medications Prior to Admission  Medication Sig Dispense Refill    acetaminophen (TYLENOL) 500 MG tablet Take 500 mg by mouth every 6 (six) hours as needed for mild pain.       albuterol (VENTOLIN HFA) 108 (90 Base) MCG/ACT inhaler Inhale 2 puffs into the lungs every 6 (six) hours as needed for wheezing or shortness of breath.       amLODipine (NORVASC) 5 MG tablet Take 1 tablet (5 mg total) by mouth daily. 90 tablet 3   aspirin EC 81 MG tablet Take 81 mg by mouth daily. Swallow whole.       atorvastatin (LIPITOR) 80 MG tablet Take 80 mg by mouth daily.       Cetirizine-Pseudoephedrine (ALLERGY RELIEF D PO) Take 1 tablet by mouth daily.       Cholecalciferol (VITAMIN D3) 250 MCG (10000 UT) TABS Take 10,000 Units by mouth in the morning and at bedtime.       empagliflozin (JARDIANCE) 25 MG TABS tablet Take 25 mg by mouth daily.       fenofibrate 160 MG tablet Take 160 mg by mouth daily.       furosemide (LASIX) 40 MG tablet TAKE 1.5 TABLETS BY MOUTH DAILY. (Patient taking differently: Take 60 mg by mouth daily.) 45 tablet 1   gabapentin (NEURONTIN) 300 MG capsule Take 300 mg by mouth 2 (two) times daily.       glimepiride (AMARYL) 2 MG tablet TAKE 1 TABLET BY MOUTH EVERY DAY 90 tablet 1   isosorbide mononitrate (IMDUR) 60 MG 24 hr tablet Take 1 tablet (60 mg total) by mouth daily. 90 tablet 3   meloxicam (MOBIC) 7.5 MG tablet TAKE 1 TABLET BY MOUTH EVERY DAY AS NEEDED 30 tablet 3   methocarbamol (ROBAXIN) 750 MG tablet TAKE 1 TABLET BY MOUTH THREE TIMES A DAY AS NEEDED 90 tablet 3   metoprolol succinate (TOPROL-XL) 25 MG 24 hr tablet Take 1 tablet (25 mg total) by mouth daily. 90 tablet 3   Multiple Vitamin (MULTIVITAMIN ADULT PO) Take 1 tablet by mouth daily.       Multiple Vitamins-Minerals (PRESERVISION AREDS 2) CAPS Take 1 tablet by mouth daily.       Omega 3 1000 MG CAPS Take 2 capsules by mouth in the morning and at bedtime.       sacubitril-valsartan (ENTRESTO) 24-26 MG Take 1 tablet by mouth 2 (two) times daily. 180 tablet 3   tiotropium (SPIRIVA) 18 MCG  inhalation capsule Place 18 mcg into inhaler and inhale daily.              Home: Home Living Family/patient expects to be discharged to:: Private residence Living Arrangements: Alone Available Help at Discharge: Neighbor, Available PRN/intermittently, Family Type of Home: Mobile home Home Access: Stairs to enter [employer]  of Steps: 3 Entrance Stairs-Rails: Left Home Layout: One level Bathroom Shower/Tub: Engineer, manufacturing systems: Standard Bathroom Accessibility: No Home Equipment: Cane - single point, Agricultural consultant (2 wheels)   Functional History: Prior Function Prior Level of Function : Independent/Modified Independent Mobility Comments: no device in home; cane in community ADLs Comments: neighbor gets her groceries; has not driven for a year (no car), sits to shower, fearful of falling in tub   Functional Status:  Mobility: Bed Mobility Overal bed mobility: Needs Assistance Bed Mobility: Supine to Sit Supine to sit: Min assist Sit to supine: Min assist General bed mobility comments: Assist to elevate trunk into sitting Transfers Overall transfer level: Needs assistance Equipment used: Rolling walker (2 wheels) Transfers: Sit to/from Stand, Bed to chair/wheelchair/BSC Sit to Stand: Min guard Bed to/from chair/wheelchair/BSC transfer type:: Step pivot Step pivot transfers: Min guard General transfer comment: Verbal cues for hand placement. Assist for safety Ambulation/Gait Ambulation/Gait assistance: Min assist Gait Distance (Feet): 3 Feet Assistive device: Rolling walker (2 wheels) Gait Pattern/deviations: Step-through pattern, Decreased step length - right, Decreased step length - left, Shuffle, Trunk flexed General Gait Details: Assist for support and balance. Pt leans over and props forearms on walker to rest. Pt fatigues quickly with activity Gait velocity: decr Gait velocity interpretation: <1.31 ft/sec, indicative of household ambulator    ADL: ADL Overall ADL's : Needs assistance/impaired Eating/Feeding: Independent, Sitting Grooming: Set up, Sitting Upper Body Bathing: Set up, Sitting Lower Body Bathing: Minimal assistance, Sit to/from stand Upper Body Dressing : Set up, Sitting Lower Body Dressing: Minimal assistance, Sit to/from stand Toilet Transfer: Min guard, Stand-pivot, Rolling walker (2 wheels) Toileting- Clothing Manipulation and Hygiene: Minimal assistance, Sit to/from stand   Cognition: Cognition Overall Cognitive Status: Within Functional Limits for tasks assessed Orientation Level: Oriented X4 Cognition Arousal/Alertness: Awake/alert Behavior During Therapy: WFL for tasks assessed/performed Overall Cognitive Status: Within Functional Limits for tasks assessed General Comments: pt hesitant to seek medical attention as her condition became worse and she was needing to sleep in the couch to elevate her head   Physical Exam: Blood pressure 106/63, pulse 80, temperature 97.8 F (36.6 C), temperature source Oral, resp. rate 19, height 5\' 6"  (1.676 m), weight 89.1 kg, SpO2 96 %. Physical Exam Constitutional: No apparent distress. Appropriate appearance for age. +Obese HENT: No JVD. Neck Supple. Trachea midline. Atraumatic, normocephalic. Eyes: PERRLA. EOMI. Visual fields grossly intact.  Cardiovascular: RRR, no murmurs/rub/gallops. No Edema. Peripheral pulses 2+  Respiratory: Decreased bilaterally, fine crackles in bases.   + 2L [place] Abdomen: + Moderate distention. +minimal bowel sounds. Mildly TTP throughout, no rebound. Skin: C/D/I. No apparent lesions. Chronic skin changes on B/L dorsal feet.  MSK:      +B/l collapsed arches +TTP L medial knee, no obvious effusion but mildly larged than R      Strength:                RUE: 5/5 SA, 5/5 EF, 5/5 EE, 5/5 WE, 5/5 FF, 5/5 FA                 LUE: 5/5 SA, 5/5 EF, 5/5 EE, 5/5 WE, 5/5 FF, 5/5 FA                 RLE: 5/5 HF, 5/5 KE, 5/5 DF, 5/5 EHL, 5/5 PF                  LLE:  5/5 HF, 5/5 KE, 5/5 DF, 5/5 EHL, 5/5 PF  Neurologic exam:  Cognition: AAO to person, place, time and event.  Language: Fluent, No substitutions or neoglisms. No dysarthria.  Memory: Recalls 3/3 objects at 5 minutes. No apparent deficits  Insight: Good  insight into current condition.  Mood: Pleasant affect, appropriate mood.  Sensation: To light touch intact in BL UEs and LEs  Reflexes: 2+ in BL UE and LEs. Negative Hoffman's and babinski signs bilaterally.  CN: 2-12 grossly intact.  Coordination: No apparent tremors. No ataxia on FTN, HTS bilaterally.  Spasticity: MAS 0 in all extremities.       Lab Results Last 48 Hours        Results for orders placed or performed during the hospital encounter of 02/04/23 (from the past 48 hour(s))  Basic metabolic panel     Status: Abnormal    Collection Time: 02/08/23 11:02 AM  Result Value Ref Range    Sodium 131 (L) 135 - 145 mmol/L    Potassium 4.2 3.5 - 5.1 mmol/L    Chloride 90 (L) 98 - 111 mmol/L    CO2 29 22 - 32 mmol/L    Glucose, Bld 159 (H) 70 - 99 mg/dL      Comment: Glucose reference range applies only to samples taken after fasting for at least 8 hours.    BUN 62 (H) 8 - 23 mg/dL    Creatinine, Ser 1.61 (H) 0.44 - 1.00 mg/dL    Calcium 9.0 8.9 - 09.6 mg/dL    GFR, Estimated 40 (L) >60 mL/min      Comment: (NOTE) Calculated using the CKD-EPI Creatinine Equation (2021)      Anion gap 12 5 - 15      Comment: Performed at [facility], [address], [place], [place] [address]  Basic metabolic panel     Status: Abnormal    Collection Time: 02/09/23 12:16 AM  Result Value Ref Range    Sodium 134 (L) 135 - 145 mmol/L    Potassium 3.6 3.5 - 5.1 mmol/L    Chloride 83 (L) 98 - 111 mmol/L    CO2 35 (H) 22 - 32 mmol/L    Glucose, Bld 159 (H) 70 - 99 mg/dL      Comment: Glucose reference range applies only to samples taken after fasting for at least 8 hours.    BUN 69 (H) 8 - 23 mg/dL    Creatinine, Ser 9.81  (H) 0.44 - 1.00 mg/dL    Calcium 9.4 8.9 - 19.1 mg/dL    GFR, Estimated 36 (L) >60 mL/min      Comment: (NOTE) Calculated using the CKD-EPI Creatinine Equation (2021)      Anion gap 16 (H) 5 - 15      Comment: Performed at [facility], [address] [place], [place] [address]  Basic metabolic panel     Status: Abnormal    Collection Time: 02/10/23  5:56 AM  Result Value Ref Range    Sodium 132 (L) 135 - 145 mmol/L    Potassium 3.8 3.5 - 5.1 mmol/L    Chloride 88 (L) 98 - 111 mmol/L    CO2 33 (H) 22 - 32 mmol/L    Glucose, Bld 117 (H) 70 - 99 mg/dL      Comment: Glucose reference range applies only to samples taken after fasting for at least 8 hours.    BUN 79 (H) 8 - 23 mg/dL    Creatinine, Ser 5.62 (H) 0.44 - 1.00 mg/dL  Calcium 9.2 8.9 - 10.3 mg/dL    GFR, Estimated 43 (L) >60 mL/min      Comment: (NOTE) Calculated using the CKD-EPI Creatinine Equation (2021)      Anion gap 11 5 - 15      Comment: Performed at [facility], [address] [place], [place] [address]      Imaging Results (Last 48 hours)  No results found.         Blood pressure 106/63, pulse 80, temperature 97.8 F (36.6 C), temperature source Oral, resp. rate 19, height 5\' 6"  (1.676 m), weight 89.1 kg, SpO2 96 %.   Medical Problem List and Plan: 1. Functional deficits secondary to debility/CHF/acute hypoxic respiratory failure             -patient may shower             -ELOS/Goals: 10-14 days, SPV goals PT/OT 2.  Antithrombotics: -DVT/anticoagulation:  Pharmaceutical: Lovenox             -antiplatelet therapy: Aspirin 81 mg daily 3. Pain Management: Tramadol as needed 4. Mood/Behavior/Sleep: Melatonin 5 mg nightly             -antipsychotic agents: N/A 5. Neuropsych/cognition: This patient is capable of making decisions on her own behalf. 6. Skin/Wound Care: Routine skin checks 7. Fluids/Electrolytes/Nutrition: Routine in and outs with follow-up chemistries 8.  COPD  exacerbation/tobacco use.  Continue nebulizers as advised.  Check oxygen saturations every shift. Tobacco cessation education; does not want patches.   9.  Hypertension.  Lasix 40 mg twice daily, Imdur 60 mg daily, hydralazine Cozaar 12.5 mg daily, 10 mg every 8 hours Toprol-XL 25 mg daily.  Monitor with increased mobility  10.  Diastolic congestive heart failure.  Ejection fraction 20 to 25%.  Continue diuresis.  Jardiance 25 mg daily.  Follow-up heart failure team. Daily weights.   11.  AKI on CKD/hyponatremia.  Follow-up chemistries.  Entresto on hold until renal function improved.  12.  Hyperlipidemia.  Lipitor  13.  Type II diabetes mellitus.  Hemoglobin A1c 6.0.   14.  Constipation. Failed enema today. Passing gas. Check KUB - mild gaseous distention, small volume stool.  Adjust bowel program - Sennakot S 2 tabs BID, PRN sorbitol.   Mcarthur Rossetti Angiulli, PA-C 02/10/2023

## 2023-02-10 NOTE — TOC Transition Note (Signed)
Transition of Care [facility]) - CM/SW Discharge Note   Patient Details  Name: Angela Lucero MRN: [id number] Date of Birth: 1941-05-30  Transition of [facility]) CM/SW Contact:  Leone Haven, RN Phone Number: 02/10/2023, 11:28 AM   Clinical Narrative:    Patient for dc to  CIR today per CIR coordinator.      Barriers to Discharge: Continued Medical Work up   Patient Goals and CMS Choice   Choice offered to / list presented to : NA  Discharge Placement                         Discharge Plan and Services Additional resources added to the After Visit Summary for   In-house Referral: NA Discharge Planning Services: CM Consult Post Acute Care Choice: NA          DME Arranged: N/A DME Agency: NA       HH Arranged: NA          Social Determinants of Health (SDOH) Interventions SDOH Screenings   Food Insecurity: No Food Insecurity (02/04/2023)  Housing: Low Risk  (02/04/2023)  Transportation Needs: No Transportation Needs (02/04/2023)  Utilities: Not At Risk (02/04/2023)  Tobacco Use: High Risk (02/04/2023)     Readmission Risk Interventions     No data to display

## 2023-02-10 NOTE — Plan of Care (Signed)
  Problem: Consults Goal: RH GENERAL PATIENT EDUCATION Description: See Patient Education module for education specifics. Outcome: Progressing   Problem: RH BOWEL ELIMINATION Goal: RH STG MANAGE BOWEL WITH ASSISTANCE Description: STG Manage Bowel with min Assistance. Outcome: Progressing Goal: RH STG MANAGE BOWEL W/MEDICATION W/ASSISTANCE Description: STG Manage Bowel with Medication with min Assistance. Outcome: Progressing   Problem: RH BLADDER ELIMINATION Goal: RH STG MANAGE BLADDER WITH ASSISTANCE Description: STG Manage Bladder With min Assistance Outcome: Progressing   Problem: RH SKIN INTEGRITY Goal: RH STG SKIN FREE OF INFECTION/BREAKDOWN Description: Skin will remain intact and be free of infection/breakdown with min assist  Outcome: Progressing Goal: RH STG MAINTAIN SKIN INTEGRITY WITH ASSISTANCE Description: STG Maintain Skin Integrity With min Assistance. Outcome: Progressing   Problem: RH SAFETY Goal: RH STG ADHERE TO SAFETY PRECAUTIONS W/ASSISTANCE/DEVICE Description: STG Adhere to Safety Precautions With cueing Assistance/Device. Outcome: Progressing Goal: RH STG DECREASED RISK OF FALL WITH ASSISTANCE Description: STG Decreased Risk of Fall With min Assistance. Outcome: Progressing   Problem: RH PAIN MANAGEMENT Goal: RH STG PAIN MANAGED AT OR BELOW PT'S PAIN GOAL Description: Pain will be managed at 4 out of 10 on pain scale with PRN medications min assist  Outcome: Progressing   Problem: RH KNOWLEDGE DEFICIT GENERAL Goal: RH STG INCREASE KNOWLEDGE OF SELF CARE AFTER HOSPITALIZATION Description: Patient/caregiver will be able manage medications and self care from nursing education and nursing handout independently  Outcome: Progressing

## 2023-02-10 NOTE — Discharge Summary (Signed)
Name: Angela Lucero MRN: [id number] DOB: 1940-09-22 82 y.o. PCP: [name], MD  Date of Admission: 02/04/2023 11:43 AM Date of Discharge: 02/10/2023 Attending Physician: Dr. Heide Spark   Discharge Diagnosis: Principal Problem:   Acute hypoxic respiratory failure (HCC) Active Problems:   Type 2 diabetes mellitus with complication, without long-term current use of insulin (HCC)   Congestive heart failure (CHF) (HCC)   Smoking   Dyslipidemia   Hyponatremia   Elevated troponin   Acute on chronic hypoxic respiratory failure (HCC)   COPD exacerbation (HCC)   Acute on chronic HFrEF (heart failure with reduced ejection fraction) (HCC)   AKI (acute kidney injury) (HCC)    Discharge Medications: Allergies as of 02/10/2023   No Known Allergies      Medication List     STOP taking these medications    fenofibrate 160 MG tablet   glimepiride 2 MG tablet Commonly known as: AMARYL   PreserVision AREDS 2 Caps   sacubitril-valsartan 24-26 MG Commonly known as: ENTRESTO   tiotropium 18 MCG inhalation capsule Commonly known as: SPIRIVA       TAKE these medications    acetaminophen 500 MG tablet Commonly known as: TYLENOL Take 500 mg by mouth every 6 (six) hours as needed for mild pain.   albuterol 108 (90 Base) MCG/ACT inhaler Commonly known as: VENTOLIN HFA Inhale 2 puffs into the lungs every 6 (six) hours as needed for wheezing or shortness of breath.   ALLERGY RELIEF D PO Take 1 tablet by mouth daily.   amLODipine 5 MG tablet Commonly known as: NORVASC Take 1 tablet (5 mg total) by mouth daily.   aspirin EC 81 MG tablet Take 81 mg by mouth daily. Swallow whole.   atorvastatin 80 MG tablet Commonly known as: LIPITOR Take 80 mg by mouth daily.   empagliflozin 25 MG Tabs tablet Commonly known as: JARDIANCE Take 25 mg by mouth daily.   feeding supplement Liqd Take 237 mLs by mouth 2 (two) times daily between meals.   furosemide 40 MG tablet Commonly known as:  LASIX Take 1 tablet (40 mg total) by mouth 2 (two) times daily. What changed: See the new instructions.   gabapentin 300 MG capsule Commonly known as: NEURONTIN Take 300 mg by mouth 2 (two) times daily.   hydrALAZINE 10 MG tablet Commonly known as: APRESOLINE Take 1 tablet (10 mg total) by mouth every 8 (eight) hours.   ipratropium-albuterol 0.5-2.5 (3) MG/3ML Soln Commonly known as: DUONEB Take 3 mLs by nebulization 3 (three) times daily.   isosorbide mononitrate 60 MG 24 hr tablet Commonly known as: IMDUR Take 1 tablet (60 mg total) by mouth daily.   losartan 25 MG tablet Commonly known as: COZAAR Take 0.5 tablets (12.5 mg total) by mouth daily. Start taking on: February 11, 2023   meloxicam 7.5 MG tablet Commonly known as: MOBIC TAKE 1 TABLET BY MOUTH EVERY DAY AS NEEDED   methocarbamol 750 MG tablet Commonly known as: ROBAXIN TAKE 1 TABLET BY MOUTH THREE TIMES A DAY AS NEEDED   metoprolol succinate 25 MG 24 hr tablet Commonly known as: TOPROL-XL Take 1 tablet (25 mg total) by mouth daily.   MULTIVITAMIN ADULT PO Take 1 tablet by mouth daily.   Omega 3 1000 MG Caps Take 2 capsules by mouth in the morning and at bedtime.   polyethylene glycol powder 17 GM/SCOOP powder Commonly known as: GLYCOLAX/MIRALAX Take 17 g by mouth daily as needed for mild constipation.   senna-docusate 8.6-50  MG tablet Commonly known as: Senokot-S Take 2 tablets by mouth 2 (two) times daily.   traMADol 50 MG tablet Commonly known as: ULTRAM TAKE 1 TABLET (50 MG TOTAL) BY MOUTH EVERY 6 (SIX) HOURS AS NEEDED FOR MODERATE PAIN   umeclidinium-vilanterol 62.5-25 MCG/ACT Aepb Commonly known as: ANORO ELLIPTA Inhale 1 puff into the lungs daily. Start taking on: February 11, 2023   Vitamin D3 250 MCG (10000 UT) Tabs Take 10,000 Units by mouth in the morning and at bedtime.        Disposition and follow-up:   Ms.Teondra Loria was discharged from [facility] in Stable  condition.  At the hospital follow up visit please address:  1.  Follow-up:  a.  HFrEF: Patient discharged on metoprolol succinate 25 mg daily, losartan 12.5 mg daily, Imdur 60 mg daily, hydralazine 10 mg, Jardiance 25 mg daily every 8 hours, and Lasix 40 mg twice daily.  Ensure patient remains euvolemic.  Titrate Lasix as tolerated.  Consider adding back Entresto if patient can tolerate losartan.  Palliative care following, may be going on home hospice after rehab.  Ensure patient does well with rehab moving forward.    b.  COPD: Patient also had COPD exacerbation during hospitalization.  Patient transition from Spiriva to [employer].  Ensure patient is not in an exacerbation.  Ensure patient refills on Anoro Ellipta.  Patient also discharged with nebulizers.   c.  Constipation: Patient remain constipated during hospitalization.  Ensure patient has bowel movement move forward   d.  CKD stage IIIb: Patient initially presented with AKI, and creatinine returned back to normal.  Continue to monitor kidney function outpatient.  Patient was started on ARB on discharge, obtain BMP on follow-up.  2.  Labs / imaging needed at time of follow-up: BMP, CBC  3.  Pending labs/ test needing follow-up: N/A  4.  Medication Changes  Started on losartan 12.5 mg on discharge.  Patient also was started on Anoro Ellipta on discharge.  Stopped patient's fenofibrate, glimepiride, Entresto, and Spiriva.  Did discharge patient on Ensure as well.  Follow-up Appointments:  Follow-up Information     [name], MD Follow up.   Specialty: Internal Medicine Contact information: [address] [place] [address] [phone]         [facility] Follow up in 28 day(s).   Specialty: Cardiology Why: Hospital follow up 03/10/2023 @ 10 am PLEASE bring a current medication list to appointment FREE valet parking, Entrance C, off [employer]  information: [address] [id number] mc [place] [address] [phone]                Hospital Course by problem list: This is a 82 year old female with a past medical history of HFrEF, hypertension, type 2 diabetes who presented to the emergency department concerns of shortness of breath.  Patient was admitted for acute hypoxic respiratory failure in the setting of COPD exacerbation as well as HFrEF exacerbation.  #Acute hypoxic respiratory failure #Acute on chronic HFrEF (Ischemic cardiomyopathy) #CAD status post stenting #Severe mitral regurgitation Patient initially presented to the emergency department short of breath, and was satting in the mid 80s.  Patient required high flow nasal cannula going at 40 L/min to maintain oxygen saturations.  Patient was admitted and started on IV diuretics.  Cardiology was consulted as well.  Patient did not have concern for an MI, but patient did have significant volume  overload concerning for HFrEF exacerbation.  New echo showed worsening ejection fraction of 20 to 25% with global hypokinesis.  Ischemic evaluation was not pursued.  Patient was diuresed well during hospitalization.  There were talks with palliative care moving forward and goals of care conversations in which patient potentially might go towards hospice after rehab.  Patient resume GDMT while inpatient, but Sherryll Burger was held off given AKI initially.  While inpatient, patient was started on hydralazine and patient continued on discharge.  Patient did start losartan on discharge.  Patient also continue metoprolol succinate, Imdur, Jardiance on discharge.  On discharge patient also increase Lasix to 40 mg twice daily from 60 mg daily.  Patient was weaned down to room air during hospital stay.  Given weakness, patient was discharged to CIR to increase strength.    #COPD exacerbation Initially on day 1 of hospitalization, there was not concern for COPD exacerbation,  but on the following day, patient noticed that she has had increased cough, sputum production, and change in color of sputum.  Patient was treated for COPD exacerbation with steroids.  Patient was weaned off oxygen well.  Patient was changed from Spiriva to [facility] on discharge.  Patient also had breathing treatments during hospitalization, and sent to CR with nebulizer as well.  Likely the exacerbation exacerbated heart failure.   #Constipation During hospitalization, patient had poor appetite, and also did not eat much.  She did report she was passing gas.  Patient did not have bowel movement during hospitalization.  Patient bowel regimen was increased.  Patient discharged to CIR.   #Pre-Renal AKI on CKD stage IIIb, resolved #Hyponatremia, improving Initially patient presented 122.  Patient also had elevated hyponatremic at creatinine concerning for AKI likely prerenal secondary to HFrEF exacerbation.  During hospitalization patient was diuresed, AKI improved.  Sodium improved well.  Discharge sodium was 132.  Patient was started on losartan on discharge.  Sherryll Burger was discontinued.   #Type 2 diabetes mellitus During hospitalization glucose remained well controlled.  Discontinued glimepiride on discharge.  Continue Jardiance on discharge.  Discharge Subjective:  Patient evaluated bedside this morning.  She states she is doing well.  She has no concerns this morning.  She states her appetite is better.  She has not had a bowel movement.  She states she is ready go.  She does want [place] to update her son.  I called her son via phone call, and he agreed with the plan with moving forward to discharge to CIR.  Discharge Exam:   BP 106/63 (BP Location: Left Arm)   Pulse 88   Temp 97.8 F (36.6 C) (Oral)   Resp 18   Ht 5\' 6"  (1.676 m)   Wt 89.1 kg   SpO2 94%   BMI 31.70 kg/m  Constitutional: Resting in bed, no acute distress, nasal cannula in place HENT: normocephalic  atraumatic Cardiovascular: regular rate and rhythm, no m/r/g Pulmonary/Chest: Some wheezing appreciated during exam today.  No crackles appreciated.  Normal work of breathing on room air Extremities: No pitting edema appreciated  Pertinent Labs, Studies, and Procedures:     Latest Ref Rng & Units 02/06/2023    1:13 AM 02/05/2023    4:29 AM 02/04/2023   11:52 AM  CBC  WBC 4.0 - 10.5 K/uL 10.7  11.0  8.9   Hemoglobin 12.0 - 15.0 g/dL 40.9  81.1  91.4   Hematocrit 36.0 - 46.0 % 36.7  37.7  40.0   Platelets 150 - 400 K/uL  316  312  340        Latest Ref Rng & Units 02/10/2023    5:56 AM 02/09/2023   12:16 AM 02/08/2023   11:02 AM  CMP  Glucose 70 - 99 mg/dL 161  096  045   BUN 8 - 23 mg/dL 79  69  62   Creatinine 0.44 - 1.00 mg/dL 4.09  8.11  9.14   Sodium 135 - 145 mmol/L 132  134  131   Potassium 3.5 - 5.1 mmol/L 3.8  3.6  4.2   Chloride 98 - 111 mmol/L 88  83  90   CO2 22 - 32 mmol/L 33  35  29   Calcium 8.9 - 10.3 mg/dL 9.2  9.4  9.0     ECHOCARDIOGRAM COMPLETE  Result Date: 02/05/2023    ECHOCARDIOGRAM REPORT   Patient Name:   EBANY RAVINDRAN  Date of Exam: 02/05/2023 Medical Rec #:  [id number]  Height:       66.0 in Accession #:    [id number] Weight:       184.1 lb Date of Birth:  1941-05-15  BSA:          1.931 m Patient Age:    81 years   BP:           127/77 mmHg Patient Gender: F          HR:           93 bpm. [facility] Procedure: 2D Echo, Cardiac Doppler and Color Doppler Indications:    Congestive Heart Failure I50.9  History:        Patient has prior history of Echocardiogram examinations, most                 recent 12/15/2021. CHF and Cardiomyopathy, CAD and Previous                 Myocardial Infarction, COPD; Risk Factors:Hypertension, Diabetes                 and Dyslipidemia.  Sonographer:    Celesta Gentile RCS Referring Phys: [id number] JULIE MACHEN IMPRESSIONS  1. Left ventricular ejection fraction, by estimation, is 20 to 25%. The left ventricle has severely decreased  function. The left ventricle demonstrates global hypokinesis. The left ventricular internal cavity size was mildly dilated. Indeterminate diastolic filling due to E-A fusion.  2. Right ventricular systolic function is normal. The right ventricular size is mildly enlarged. There is moderately elevated pulmonary artery systolic pressure. The estimated right ventricular systolic pressure is 49.1 mmHg.  3. Left atrial size was severely dilated.  4. Right atrial size was mildly dilated.  5. The mitral valve is abnormal. Severe mitral valve regurgitation. No evidence of mitral stenosis.  6. The aortic valve is tricuspid. Aortic valve regurgitation is not visualized. Aortic valve sclerosis/calcification is present, without any evidence of aortic stenosis.  7. The inferior vena cava is dilated in size with <50% respiratory variability, suggesting right atrial pressure of 15 mmHg. Comparison(s): Changes from prior study are noted. The left ventricular function is significantly worse. Severe MR is now present. FINDINGS  Left Ventricle: Left ventricular ejection fraction, by estimation, is 20 to 25%. The left ventricle has severely decreased function. The left ventricle demonstrates global hypokinesis. Definity contrast agent was given IV to delineate the left ventricular endocardial borders. The left ventricular internal cavity size was mildly dilated. There is no left ventricular hypertrophy. Indeterminate diastolic filling due to E-A  fusion. Right Ventricle: The right ventricular size is mildly enlarged. No increase in right ventricular wall thickness. Right ventricular systolic function is normal. There is moderately elevated pulmonary artery systolic pressure. The tricuspid regurgitant velocity is 2.92 m/s, and with an assumed right atrial pressure of 15 mmHg, the estimated right ventricular systolic pressure is 49.1 mmHg. Left Atrium: Left atrial size was severely dilated. Right Atrium: Right atrial size was mildly  dilated. Pericardium: There is no evidence of pericardial effusion. Mitral Valve: The mitral valve is abnormal. Severe mitral valve regurgitation. No evidence of mitral valve stenosis. MV peak gradient, 8.1 mmHg. The mean mitral valve gradient is 3.0 mmHg. Tricuspid Valve: The tricuspid valve is grossly normal. Tricuspid valve regurgitation is mild . No evidence of tricuspid stenosis. Aortic Valve: The aortic valve is tricuspid. Aortic valve regurgitation is not visualized. Aortic valve sclerosis/calcification is present, without any evidence of aortic stenosis. Aortic valve mean gradient measures 2.0 mmHg. Aortic valve peak gradient measures 3.2 mmHg. Aortic valve area, by VTI measures 3.09 cm. Pulmonic Valve: The pulmonic valve was grossly normal. Pulmonic valve regurgitation is trivial. No evidence of pulmonic stenosis. Aorta: The aortic root is normal in size and structure. Venous: The inferior vena cava is dilated in size with less than 50% respiratory variability, suggesting right atrial pressure of 15 mmHg. IAS/Shunts: The atrial septum is grossly normal.  LEFT VENTRICLE PLAX 2D LVIDd:         6.10 cm LVIDs:         5.10 cm LV PW:         0.90 cm LV IVS:        0.80 cm LVOT diam:     1.90 cm LV SV:         48 LV SV Index:   25 LVOT Area:     2.84 cm  LV Volumes (MOD) LV vol d, MOD A2C: 148.0 ml LV vol d, MOD A4C: 218.0 ml LV vol s, MOD A2C: 107.0 ml LV vol s, MOD A4C: 150.0 ml LV SV MOD A2C:     41.0 ml LV SV MOD A4C:     218.0 ml LV SV MOD BP:      52.8 ml RIGHT VENTRICLE RV S prime:     10.80 cm/s TAPSE (M-mode): 2.4 cm LEFT ATRIUM            Index        RIGHT ATRIUM           Index LA diam:      4.80 cm  2.49 cm/m   RA Area:     24.10 cm LA Vol (A2C): 113.0 ml 58.53 ml/m  RA Volume:   84.80 ml  43.93 ml/m LA Vol (A4C): 105.0 ml 54.39 ml/m  AORTIC VALVE AV Area (Vmax):    2.96 cm AV Area (Vmean):   2.61 cm AV Area (VTI):     3.09 cm AV Vmax:           89.40 cm/s AV Vmean:          68.800 cm/s AV  VTI:            0.157 m AV Peak Grad:      3.2 mmHg AV Mean Grad:      2.0 mmHg LVOT Vmax:         93.40 cm/s LVOT Vmean:        63.350 cm/s LVOT VTI:          0.171  m LVOT/AV VTI ratio: 1.09  AORTA Ao Root diam: 3.40 cm MITRAL VALVE                  TRICUSPID VALVE MV Area (PHT): 4.46 cm       TR Peak grad:   34.1 mmHg MV Area VTI:   1.53 cm       TR Vmax:        292.00 cm/s MV Peak grad:  8.1 mmHg MV Mean grad:  3.0 mmHg       SHUNTS MV Vmax:       1.42 m/s       Systemic VTI:  0.17 m MV Vmean:      74.3 cm/s      Systemic Diam: 1.90 cm MV Decel Time: 170 msec MR Peak grad:    94.5 mmHg MR Mean grad:    45.0 mmHg MR Vmax:         486.00 cm/s MR Vmean:        298.0 cm/s MR PISA:         5.09 cm MR PISA Eff ROA: 24 mm MR PISA Radius:  0.90 cm MV E velocity: 124.00 cm/s Lennie Odor MD Electronically signed by Lennie Odor MD Signature Date/Time: 02/05/2023/3:35:07 PM    Final    DG Chest Port 1 View  Result Date: 02/04/2023 CLINICAL DATA:  coughm, sob, copd, chf EXAM: PORTABLE CHEST 1 VIEW COMPARISON:  CXR 12/13/21 FINDINGS: The heart size and mediastinal contours are within normal limits. Aortic atherosclerotic calcifications. No pleural effusion. No pneumothorax. Hazy bibasilar airspace opacities could represent atelectasis or infection. The visualized skeletal structures are unremarkable. IMPRESSION: Hazy bibasilar airspace opacities could represent atelectasis or infection. Electronically Signed   By: Lorenza Cambridge M.D.   On: 02/04/2023 12:15     Discharge Instructions: Discharge Instructions     Diet - low sodium heart healthy   Complete by: As directed    Discharge instructions   Complete by: As directed    Ms. Corey Zuleger It was a pleasure taking care of you at [facility]. You were admitted for heart failure and COPD exacerbation. You were treated with fluid pills and breathing treatments. We are discharging you home now that you are doing better. Please follow the following  instructions.   1) For your heart failure, we have treated you with Lasix, and are increasing your dose of Lasix at home.  From now on start taking 40 mg of Lasix twice a day.  We also stopped your Entresto.  Stop taking Entresto.  Please start taking losartan 12.5 mg daily.  Otherwise continue taking your metoprolol, Jardiance, and Imdur.  We also started a new medication called hydralazine.  Please take hydralazine 10 mg 3 times daily.  2) For your diabetes, please stop taking glimepiride, and continue taking Jardiance.  3) For your COPD, we have changed your inhalers.  Stop taking the Spiriva.  We have added a new inhaler called Anoro Ellipta.  Please take this daily.  Will also have ordered for some breathing treatments, continue taking these breathing treatments at home.  4) Continue taking all of your other medications as prescribed.  5) Please follow-up with your primary care doctor as well as your heart doctor after you get out of rehab.  You should also be followed by palliative care.  6) Regarding your constipation, we are sending you home on a bowel regimen.  Please take senna and MiraLAX daily.  Take  care,  Dr. Modena Slater, DO   Increase activity slowly   Complete by: As directed        Signed: Modena Slater, DO 02/10/2023, 2:41 PM   Pager: [phone]

## 2023-02-10 NOTE — Progress Notes (Signed)
Heart Failure Nurse Navigator Progress Note  PCP: [name], MD PCP-Cardiologist: Bing Matter Admission Diagnosis: Acute hypoxic respiratory failure, COPD exacerbation, Elevated troponin, Hyponatremia, AKI, Hypoglycemia.  Admitted from: Home via EMS  Presentation:   Angela Lucero presented with shortness of breath, per EMS on arrival oxygen sats were 83 % on RA, chest pain, CBG was 56, oral glucose given, chronic BLE edema, Troponin 147, IV lasix given, CXR with bibasilar opacities showing either atelectasis or infection. Plan for patient to go to CIR at discharge then home.   Patient was educated on the sign and symptoms of heart failure, daily weights, when to call her doctor or go to the ED, Diet/ fluid restrictions, smoking cessation, taking all medication as prescribed and attending all medical appointments. Patient verbalized her understanding of education, a HF TOC appointment was scheduled for 03/10/2023 @ 10 am, after she is discharged from CIR.   ECHO/ LVEF: 20-25%  Clinical Course:  Past Medical History:  Diagnosis Date   Arthritis 2010   Lower back and neck   CAD (coronary artery disease)    COPD (chronic obstructive pulmonary disease) (HCC)    CVA (cerebral vascular accident) (HCC)    1997, 2005 stents   Diabetes (HCC)    Diabetes mellitus, type II (HCC)    Gout 1998   Heart attack (HCC)    [id number]   Hyperlipidemia    Hypertension    Hypertriglyceridemia      Social History   Socioeconomic History   Marital status: Widowed    Spouse name: Not on file   Number of children: Not on file   Years of education: Not on file   Highest education level: Not on file  Occupational History   Not on file  Tobacco Use   Smoking status: Every Day    Packs/day: 1.5    Types: Cigarettes   Smokeless tobacco: Never  Vaping Use   Vaping Use: Never used  Substance and Sexual Activity   Alcohol use: Not Currently   Drug use: Never   Sexual activity: Not Currently  Other  Topics Concern   Not on file  Social History Narrative   Not on file   Social Determinants of Health   Financial Resource Strain: Not on file  Food Insecurity: No Food Insecurity (02/04/2023)   Hunger Vital Sign    Worried About Running Out of Food in the Last Year: Never true    Ran Out of Food in the Last Year: Never true  Transportation Needs: No Transportation Needs (02/04/2023)   PRAPARE - Administrator, Civil Service (Medical): No    Lack of Transportation (Non-Medical): No  Physical Activity: Not on file  Stress: Not on file  Social Connections: Not on file   Education Assessment and Provision:  Detailed education and instructions provided on heart failure disease management including the following:  Signs and symptoms of Heart Failure When to call the physician Importance of daily weights Low sodium diet Fluid restriction Medication management Anticipated future follow-up appointments  Patient education given on each of the above topics.  Patient acknowledges understanding via teach back method and acceptance of all instructions.  Education Materials:  "Living Better With Heart Failure" Booklet, HF zone tool, & Daily Weight Tracker Tool.  Patient has scale at home: yes Patient has pill box at home: yes    High Risk Criteria for Readmission and/or Poor Patient Outcomes: Heart failure hospital admissions (last 6 months): 0  No Show rate: 14%  Difficult social situation: No Demonstrates medication adherence: yes Primary Language: English Literacy level: Reading, writing,a nd comprehension  Barriers of Care:   Diet/ fluid restrictions Daily weights Smoking cessation  Considerations/Referrals:   Referral made to Heart Failure Pharmacist Stewardship: Yes Referral made to Heart Failure CSW/NCM TOC: No Referral made to Heart & Vascular TOC clinic: Yes, after CIR, 03/10/2023 @ 10 am  Items for Follow-up on DC/TOC: Diet/ fluid restrictions/ daily  weights Smoking cessation Continued HF education   Rhae Hammock, BSN, RN Heart Failure Teacher, adult education Only

## 2023-02-10 NOTE — Discharge Instructions (Signed)
Ms. Angela Lucero It was a pleasure taking care of you at [facility]. You were admitted for heart failure and COPD exacerbation. You were treated with fluid pills and breathing treatments. We are discharging you home now that you are doing better. Please follow the following instructions.   1) For your heart failure, we have treated you with Lasix, and are increasing your dose of Lasix at home.  From now on start taking 40 mg of Lasix twice a day.  We also stopped your Entresto.  Stop taking Entresto.  Please start taking losartan 12.5 mg daily.  Otherwise continue taking your metoprolol, Jardiance, and Imdur.  We also started a new medication called hydralazine.  Please take hydralazine 10 mg 3 times daily.  2) For your diabetes, please stop taking glimepiride, and continue taking Jardiance.  3) For your COPD, we have changed your inhalers.  Stop taking the Spiriva.  We have added a new inhaler called Anoro Ellipta.  Please take this daily.  Will also have ordered for some breathing treatments, continue taking these breathing treatments at home.  4) Continue taking all of your other medications as prescribed.  5) Please follow-up with your primary care doctor as well as your heart doctor after you get out of rehab.  You should also be followed by palliative care.  6) Regarding your constipation, we are sending you home on a bowel regimen.  Please take senna and MiraLAX daily.  Take care,  Dr. Modena Slater, DO

## 2023-02-10 NOTE — Discharge Instructions (Addendum)
Inpatient Rehab Discharge Instructions  Angela Lucero Discharge date and time: No discharge date for patient encounter.   Activities/Precautions/ Functional Status: Activity: activity as tolerated Diet: regular diet Wound Care: Routine skin checks Functional status:  ___ No restrictions     ___ Walk up steps independently ___ 24/7 supervision/assistance   ___ Walk up steps with assistance ___ Intermittent supervision/assistance  ___ Bathe/dress independently ___ Walk with walker     _x__ Bathe/dress with assistance ___ Walk Independently    ___ Shower independently ___ Walk with assistance    ___ Shower with assistance ___ No alcohol     ___ Return to work/school ________  Special Instructions: No driving smoking or alcohol  2 L nasal cannula oxygen therapy as needed shortness of breath   My questions have been answered and I understand these instructions. I will adhere to these goals and the provided educational materials after my discharge from the hospital.  Patient/Caregiver Signature _______________________________ Date __________  Clinician Signature _______________________________________ Date __________  Please bring this form and your medication list with you to all your follow-up doctor's appointments.

## 2023-02-10 NOTE — Progress Notes (Signed)
Inpatient Rehabilitation Admission Medication Review by a Pharmacist  A complete drug regimen review was completed for this patient to identify any potential clinically significant medication issues.  High Risk Drug Classes Is patient taking? Indication by Medication  Antipsychotic No   Anticoagulant Yes Lovenox - VTE ppx  Antibiotic No   Opioid Yes Tramadol - prn pain  Antiplatelet No   Hypoglycemics/insulin Yes Jardiance - CHF, SSI - DM  Vasoactive Medication Yes Furosemide, hydralazine, Imdur, losartan, Toprol XL - CHF  Chemotherapy No   Other Yes Atorvastatin - HLD Duo-neb, Anoro - COPD     Type of Medication Issue Identified Description of Issue Recommendation(s)  Drug Interaction(s) (clinically significant)     Duplicate Therapy     Allergy     No Medication Administration End Date     Incorrect Dose     Additional Drug Therapy Needed     Significant med changes from prior encounter (inform family/care partners about these prior to discharge).    Other       Clinically significant medication issues were identified that warrant physician communication and completion of prescribed/recommended actions by midnight of the next day:  No  Pharmacist comments: None  Time spent performing this drug regimen review (minutes):  20 minutes  Thank you Okey Regal, PharmD

## 2023-02-10 NOTE — Progress Notes (Signed)
Progress Note  Patient Name: Angela Lucero Date of Encounter: 02/10/2023  Primary Cardiologist:   Gypsy Balsam, MD   Subjective   Breathing OK this morning on RA.  No pain.    Inpatient Medications    Scheduled Meds:  aspirin EC  81 mg Oral Daily   atorvastatin  80 mg Oral Daily   empagliflozin  25 mg Oral Daily   enoxaparin (LOVENOX) injection  40 mg Subcutaneous Q24H   feeding supplement  237 mL Oral BID BM   furosemide  40 mg Oral BID   hydrALAZINE  10 mg Oral Q8H   ipratropium-albuterol  3 mL Nebulization TID   isosorbide mononitrate  60 mg Oral Daily   melatonin  5 mg Oral QHS   metoprolol succinate  25 mg Oral Daily   senna-docusate  2 tablet Oral BID   umeclidinium-vilanterol  1 puff Inhalation Daily   Continuous Infusions:   PRN Meds: acetaminophen, albuterol, nitroGLYCERIN, ondansetron (ZOFRAN) IV, mouth rinse, polyethylene glycol, simethicone, traMADol   Vital Signs    Vitals:   02/09/23 2322 02/10/23 0458 02/10/23 0548 02/10/23 0713  BP: 122/69 (!) 106/59 115/61 121/65  Pulse: 75 84  85  Resp: 16 16  19   Temp: 97.8 F (36.6 C) 98.1 F (36.7 C)  (!) 97.5 F (36.4 C)  TempSrc: Oral Oral  Oral  SpO2: 98% 97%  95%  Weight:  89.1 kg    Height:        Intake/Output Summary (Last 24 hours) at 02/10/2023 0806 Last data filed at 02/10/2023 0804 Gross per 24 hour  Intake 480 ml  Output 1700 ml  Net -1220 ml   Filed Weights   02/06/23 0400 02/07/23 0437 02/10/23 0458  Weight: 84.8 kg 89.1 kg 89.1 kg    Telemetry    NSR, PVCs, NSVT short run.    - Personally Reviewed  ECG    NA - Personally Reviewed  Physical Exam   GEN: No  acute distress.   Neck: No  JVD Cardiac: RRR, no murmurs, rubs, or gallops.  Respiratory:     Few scattered crackles and expiratory wheezing GI: Soft, nontender, non-distended, normal bowel sounds  MS:  Trace leg edema; No deformity. Neuro:   Nonfocal  Psych: Oriented and appropriate   Labs     Chemistry Recent Labs  Lab 02/04/23 1152 02/04/23 1746 02/08/23 1102 02/09/23 0016 02/10/23 0556  NA 122*   < > 131* 134* 132*  K 5.0   < > 4.2 3.6 3.8  CL 88*   < > 90* 83* 88*  CO2 21*   < > 29 35* 33*  GLUCOSE 43*   < > 159* 159* 117*  BUN 45*   < > 62* 69* 79*  CREATININE 1.89*   < > 1.35* 1.45* 1.26*  CALCIUM 9.2   < > 9.0 9.4 9.2  PROT 6.8  --   --   --   --   ALBUMIN 3.3*  --   --   --   --   AST 56*  --   --   --   --   ALT 35  --   --   --   --   ALKPHOS 33*  --   --   --   --   BILITOT 0.8  --   --   --   --   GFRNONAA 27*   < > 40* 36* 43*  ANIONGAP 13   < >  12 16* 11   < > = values in this interval not displayed.     Hematology Recent Labs  Lab 02/04/23 1152 02/05/23 0429 02/06/23 0113  WBC 8.9 11.0* 10.7*  RBC 4.26 4.22 4.07  HGB 13.2 12.8 12.3  HCT 40.0 37.7 36.7  MCV 93.9 89.3 90.2  MCH 31.0 30.3 30.2  MCHC 33.0 34.0 33.5  RDW 14.6 14.6 14.6  PLT 340 312 316    Cardiac EnzymesNo results for input(s): "TROPONINI" in the last 168 hours. No results for input(s): "TROPIPOC" in the last 168 hours.   BNP Recent Labs  Lab 02/04/23 1152  BNP 3,951.4*     DDimer No results for input(s): "DDIMER" in the last 168 hours.   Radiology    No results found.  Cardiac Studies   Echo:  1. Left ventricular ejection fraction, by estimation, is 20 to 25%. The  left ventricle has severely decreased function. The left ventricle  demonstrates global hypokinesis. The left ventricular internal cavity size  was mildly dilated. Indeterminate  diastolic filling due to E-A fusion.   2. Right ventricular systolic function is normal. The right ventricular  size is mildly enlarged. There is moderately elevated pulmonary artery  systolic pressure. The estimated right ventricular systolic pressure is  49.1 mmHg.   3. Left atrial size was severely dilated.   4. Right atrial size was mildly dilated.   5. The mitral valve is abnormal. Severe mitral valve  regurgitation. No  evidence of mitral stenosis.   6. The aortic valve is tricuspid. Aortic valve regurgitation is not  visualized. Aortic valve sclerosis/calcification is present, without any  evidence of aortic stenosis.   7. The inferior vena cava is dilated in size with <50% respiratory  variability, suggesting right atrial pressure of 15 mmHg.    Patient Profile     82 y.o. female with systolic heart failure (EF 24%), CAD, CTO of mid LAD with collaterals, diabetes, CKD IIIb admitted on 02/04/2023 for acute on chronic systolic heart failure.   Assessment & Plan     Acute on chronic systolic heart failure, EF 20-25%:   Net negative 6.9 liters.  BP is soft.  I would like to try low dose ACE inhibitor as the creat has improved.      CAD/CTO of mid LAD (nonviable):    Medical management.   Hypervolemic hyponatremia:   Na stable.    CKD stage IIIb:    Creat is improved.      For questions or updates, please contact [facility] Please consult [email or web address] for contact info under Cardiology/STEMI.   Signed, Rollene Rotunda, MD  02/10/2023, 8:06 AM

## 2023-02-11 ENCOUNTER — Other Ambulatory Visit (HOSPITAL_COMMUNITY): Payer: Self-pay

## 2023-02-11 DIAGNOSIS — R5381 Other malaise: Secondary | ICD-10-CM | POA: Diagnosis not present

## 2023-02-11 LAB — CBC WITH DIFFERENTIAL/PLATELET
Abs Immature Granulocytes: 0.05 10*3/uL (ref 0.00–0.07)
Basophils Absolute: 0 10*3/uL (ref 0.0–0.1)
Basophils Relative: 0 %
Eosinophils Absolute: 0.2 10*3/uL (ref 0.0–0.5)
Eosinophils Relative: 1 %
HCT: 37.7 % (ref 36.0–46.0)
Hemoglobin: 12.5 g/dL (ref 12.0–15.0)
Immature Granulocytes: 0 %
Lymphocytes Relative: 11 %
Lymphs Abs: 1.3 10*3/uL (ref 0.7–4.0)
MCH: 30.1 pg (ref 26.0–34.0)
MCHC: 33.2 g/dL (ref 30.0–36.0)
MCV: 90.8 fL (ref 80.0–100.0)
Monocytes Absolute: 1.4 10*3/uL — ABNORMAL HIGH (ref 0.1–1.0)
Monocytes Relative: 12 %
Neutro Abs: 9 10*3/uL — ABNORMAL HIGH (ref 1.7–7.7)
Neutrophils Relative %: 76 %
Platelets: 284 10*3/uL (ref 150–400)
RBC: 4.15 MIL/uL (ref 3.87–5.11)
RDW: 14.7 % (ref 11.5–15.5)
WBC: 11.9 10*3/uL — ABNORMAL HIGH (ref 4.0–10.5)
nRBC: 0 % (ref 0.0–0.2)

## 2023-02-11 LAB — GLUCOSE, CAPILLARY
Glucose-Capillary: 117 mg/dL — ABNORMAL HIGH (ref 70–99)
Glucose-Capillary: 190 mg/dL — ABNORMAL HIGH (ref 70–99)
Glucose-Capillary: 127 mg/dL — ABNORMAL HIGH (ref 70–99)
Glucose-Capillary: 143 mg/dL — ABNORMAL HIGH (ref 70–99)

## 2023-02-11 LAB — COMPREHENSIVE METABOLIC PANEL
ALT: 32 U/L (ref 0–44)
AST: 34 U/L (ref 15–41)
Albumin: 3 g/dL — ABNORMAL LOW (ref 3.5–5.0)
Alkaline Phosphatase: 33 U/L — ABNORMAL LOW (ref 38–126)
Anion gap: 12 (ref 5–15)
BUN: 78 mg/dL — ABNORMAL HIGH (ref 8–23)
CO2: 30 mmol/L (ref 22–32)
Calcium: 9.1 mg/dL (ref 8.9–10.3)
Chloride: 91 mmol/L — ABNORMAL LOW (ref 98–111)
Creatinine, Ser: 1.55 mg/dL — ABNORMAL HIGH (ref 0.44–1.00)
GFR, Estimated: 34 mL/min — ABNORMAL LOW (ref >60)
Glucose, Bld: 137 mg/dL — ABNORMAL HIGH (ref 70–99)
Potassium: 3.4 mmol/L — ABNORMAL LOW (ref 3.5–5.1)
Sodium: 133 mmol/L — ABNORMAL LOW (ref 135–145)
Total Bilirubin: 0.7 mg/dL (ref 0.3–1.2)
Total Protein: 6 g/dL — ABNORMAL LOW (ref 6.5–8.1)

## 2023-02-11 MED ORDER — POTASSIUM CHLORIDE CRYS ER 20 MEQ PO TBCR
20.0000 meq | EXTENDED_RELEASE_TABLET | Freq: Two times a day (BID) | ORAL | Status: DC
  Administered 2023-02-11 – 2023-02-12 (×3): 20 meq via ORAL
  Filled 2023-02-11 (×3): qty 1

## 2023-02-11 MED ORDER — GLYCERIN (LAXATIVE) 2 G RE SUPP: 1.0000 | RECTAL | Status: DC | PRN

## 2023-02-11 MED ORDER — POLYETHYLENE GLYCOL 3350 17 G PO PACK
17.0000 g | PACK | Freq: Every day | ORAL | Status: DC
  Administered 2023-02-11 – 2023-02-16 (×4): 17 g via ORAL
  Filled 2023-02-11 (×5): qty 1

## 2023-02-11 NOTE — Plan of Care (Signed)
  Problem: Consults Goal: RH GENERAL PATIENT EDUCATION Description: See Patient Education module for education specifics. Outcome: Progressing   Problem: RH BOWEL ELIMINATION Goal: RH STG MANAGE BOWEL WITH ASSISTANCE Description: STG Manage Bowel with min Assistance. Outcome: Progressing Goal: RH STG MANAGE BOWEL W/MEDICATION W/ASSISTANCE Description: STG Manage Bowel with Medication with min Assistance. Outcome: Progressing   Problem: RH BLADDER ELIMINATION Goal: RH STG MANAGE BLADDER WITH ASSISTANCE Description: STG Manage Bladder With min Assistance Outcome: Progressing   Problem: RH SKIN INTEGRITY Goal: RH STG SKIN FREE OF INFECTION/BREAKDOWN Description: Skin will remain intact and be free of infection/breakdown with min assist  Outcome: Progressing Goal: RH STG MAINTAIN SKIN INTEGRITY WITH ASSISTANCE Description: STG Maintain Skin Integrity With min Assistance. Outcome: Progressing   Problem: RH SAFETY Goal: RH STG ADHERE TO SAFETY PRECAUTIONS W/ASSISTANCE/DEVICE Description: STG Adhere to Safety Precautions With cueing Assistance/Device. Outcome: Progressing Goal: RH STG DECREASED RISK OF FALL WITH ASSISTANCE Description: STG Decreased Risk of Fall With min Assistance. Outcome: Progressing   Problem: RH PAIN MANAGEMENT Goal: RH STG PAIN MANAGED AT OR BELOW PT'S PAIN GOAL Description: Pain will be managed at 4 out of 10 on pain scale with PRN medications min assist  Outcome: Progressing   Problem: RH KNOWLEDGE DEFICIT GENERAL Goal: RH STG INCREASE KNOWLEDGE OF SELF CARE AFTER HOSPITALIZATION Description: Patient/caregiver will be able manage medications and self care from nursing education and nursing handout independently  Outcome: Progressing   

## 2023-02-11 NOTE — Evaluation (Signed)
Occupational Therapy Assessment and Plan  Patient Details  Name: Angela Lucero MRN: [id number] Date of Birth: 19-Feb-1941  OT Diagnosis: acute pain, muscle weakness (generalized), and pain in thoracic spine Rehab Potential: Rehab Potential (ACUTE ONLY): Good ELOS: 10- 12 days   Today's Date: 02/11/2023 OT Individual Time: 4098-1191 OT Individual Time Calculation (min): 71 min     Hospital Problem: Principal Problem:   Debility   Past Medical History:  Past Medical History:  Diagnosis Date   Arthritis 2010   Lower back and neck   CAD (coronary artery disease)    COPD (chronic obstructive pulmonary disease) (HCC)    CVA (cerebral vascular accident) (HCC)    1997, 2005 stents   Diabetes (HCC)    Diabetes mellitus, type II (HCC)    Gout 1998   Heart attack (HCC)    4782,9562   Hyperlipidemia    Hypertension    Hypertriglyceridemia    Past Surgical History:  Past Surgical History:  Procedure Laterality Date   APPENDECTOMY  1971   CHOLECYSTECTOMY     LEFT HEART CATH AND CORONARY ANGIOGRAPHY N/A 12/14/2021   Procedure: LEFT HEART CATH AND CORONARY ANGIOGRAPHY;  Surgeon: Yvonne Kendall, MD;  Location: [facility];  Service: Cardiovascular;  Laterality: N/A;   Navel hernia repair  2007   PARTIAL HYSTERECTOMY  1976   Stent Heart     TUBAL LIGATION  1974   Ulna nerve transpost L arm      Assessment & Plan Clinical Impression: Angela Lucero is a 82 year old right handed female with H/O CAD with left heart cath 12/14/2021,, diastolic congestive heart failure with ejection fraction of 30 to 35%, COPD/tobacco use maintained on aspirin,CVA 1997 without residual deficits , CKD stage III with creatinine 1.32-1.60 HTN,HLD, type diabetes mellitus. Chart review patient lives alone.  1 level home 3 steps to entry.  Independent prior to admission.  Presented 02/04/2023 with  progressive shortness of breath as well as sharp acute chest pain.  Admission chemistries BNP 3951, troponin 147-136,  sodium 122, chloride 88, glucose 43, BUN 45, creatinine 1.89, lactic acid within normal limits, globin A1c 6.0.  EKG did not show any evidence of acute ischemia.  Patient did receive 2 nitroglycerin tablets and chest pain subsided.  Chest x-ray hazy bibasilar airspace opacities representing atelectasis and/or infection.  Echo with ejection fraction of 20 to 25%.  Global hypokinesis..  Cardiology consulted for acute on chronic systolic congestive heart failure with conservative care and presently maintained on Jardiance as well as Lasix 40 mg twice daily as per heart failure team.  Sherryll Burger remains on hold for AKI considering restarting if creatinine improves.  Lovenox has been added for DVT prophylaxis.  Patient initially and oxygen demand and slowly has been weaned.  Mild elevation in creatinine to 1.81 felt to be related to diuresis and monitor closely.  Hyponatremia 122-126.  Palliative care consulted to establish goals of care.  Patient with reported constipation.  Therapy evaluations completed due to patient debility and decreased functional mobility was admitted for a comprehensive rehab program.  Patient currently requires mod with basic self-care skills secondary to muscle weakness, decreased cardiorespiratoy endurance and decreased oxygen support, decreased coordination and decreased safety awareness, and decreased standing balance and decreased balance strategies.  Prior to hospitalization, patient could complete ADLS/iADLs with independent .  Patient will benefit from skilled intervention to decrease level of assist with basic self-care skills, increase independence with basic self-care skills, and increase level of independence with  iADL prior to discharge independent and intermittent physical assist. follow up home health.  OT - End of Session Activity Tolerance: Tolerates 30+ min activity without fatigue Endurance Deficit: Yes Endurance Deficit Description: Global deconditioning with  significantly poor endurance/activity tolerance OT Assessment Rehab Potential (ACUTE ONLY): Good OT Barriers to Discharge: Decreased caregiver support OT Patient demonstrates impairments in the following area(s): Balance;Safety;Edema;Endurance;Motor;Pain OT Basic ADL's Functional Problem(s): Bathing;Dressing;Toileting OT Transfers Functional Problem(s): Tub/Shower;Toilet OT Additional Impairment(s): None OT Plan OT Intensity: Minimum of 1-2 x/day, 45 to 90 minutes OT Frequency: 5 out of 7 days OT Duration/Estimated Length of Stay: 10- 12 days OT Treatment/Interventions: Balance/vestibular training;Discharge planning;Pain management;Self Care/advanced ADL retraining;Therapeutic Activities;Disease mangement/prevention;Functional mobility training;Patient/family education;Skin care/wound managment;Therapeutic Exercise;Community reintegration;DME/adaptive equipment instruction;Psychosocial support;UE/LE Strength taining/ROM OT Self Feeding Anticipated Outcome(s): no goal OT Basic Self-Care Anticipated Outcome(s): mod I OT Toileting Anticipated Outcome(s): mod I OT Bathroom Transfers Anticipated Outcome(s): mod I OT Recommendation Patient destination: Home Follow Up Recommendations: Home health OT Equipment Recommended: To be determined   OT Evaluation Precautions/Restrictions  Precautions Precautions: Fall Precaution Comments: Monitor SpO2, L knee pain Restrictions Weight Bearing Restrictions: No General Chart Reviewed: Yes Family/Caregiver Present: No Vital Signs Therapy Vitals Pulse Rate: 84 BP: (!) 118/57 Oxygen Therapy SpO2: 97 % O2 Device: Nasal Cannula O2 Flow Rate (L/min): 2 L/min Pain Pain Assessment Pain Scale: 0-10 Pain Score: 0-No pain Home Living/Prior Functioning Home Living Family/patient expects to be discharged to:: Private residence Living Arrangements: Alone Available Help at Discharge: Family, Friend(s) Type of Home: Mobile home Home Access: Stairs to  enter Secretary/administrator of Steps: 3 Entrance Stairs-Rails: Left Home Layout: One level Bathroom Shower/Tub: Engineer, manufacturing systems: Standard Bathroom Accessibility: Yes Additional Comments: Patient reports she has used the walker to access the bathroom prior  Lives With: Alone IADL History Homemaking Responsibilities: Yes Meal Prep Responsibility: Primary Laundry Responsibility: Primary Cleaning Responsibility: Primary (Does not Mop, neighbor helps with this) Bill Paying/Finance Responsibility: Primary Shopping Responsibility: Secondary (Neighbor helps with grocery shopping) Child Care Responsibility: No Current License: Yes Mode of Transportation: Car (Has not driven for a year) Occupation: Retired 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, worked in a nursing home Leisure and Hobbies: Watching sports-Football, baseball, basketball Prior Function Level of Independence: Independent with basic ADLs, Independent with homemaking with ambulation, Independent with gait, Independent with transfers  Able to Take Stairs?: Yes Driving: No (Hasn't driven for over a year, per patient report) 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 Leisure: Hobbies-yes (Comment) (Watch sports- [employer] basketball and [employer]) Vision Baseline Vision/History: 1 Wears glasses (Reports she needs cataracts surgery and has not gotten it done yet- Glasses for reading) Ability to See in Adequate Light: 0 Adequate Patient Visual Report: No change from baseline Perception  Perception: Within Functional Limits Praxis Praxis: Intact Cognition Cognition Overall Cognitive Status: Within Functional Limits for tasks assessed Arousal/Alertness: Awake/alert Orientation Level: Person;Place;Situation Person: Oriented Place: Oriented Situation: Oriented Memory: Appears intact Awareness: Appears intact Problem Solving: Appears intact Safety/Judgment: Appears intact Brief  Interview for Mental Status (BIMS) Repetition of Three Words (First Attempt): 3 Temporal Orientation: Year: Correct Temporal Orientation: Month: Accurate within 5 days Temporal Orientation: Day: Correct Recall: "Sock": Yes, no cue required Recall: "Blue": Yes, after cueing ("a color") Recall: "Bed": Yes, no cue required BIMS Summary Score: 14 Sensation Sensation Light Touch: Appears Intact Hot/Cold: Appears Intact Coordination Gross Motor Movements are Fluid and Coordinated: No Fine Motor Movements are Fluid and Coordinated: Yes Coordination and Movement Description: Global deconditioning Motor  Motor Motor: Other (comment) Motor - Skilled Clinical Observations: generalized weakness with poor endurance/activity tolerance  Trunk/Postural Assessment  Cervical Assessment Cervical Assessment: Within Functional Limits Thoracic Assessment Thoracic Assessment: Within Functional Limits Lumbar Assessment Lumbar Assessment: Exceptions to [facility] (Posterior pelvic tilt) Postural Control Postural Control: Deficits on evaluation Righting Reactions: delayed Protective Responses: delayed  Balance Balance Balance Assessed: Yes Static Sitting Balance Static Sitting - Balance Support: Feet supported Static Sitting - Level of Assistance: 6: Modified independent (Device/Increase time) Dynamic Sitting Balance Dynamic Sitting - Balance Support: Feet supported Dynamic Sitting - Level of Assistance: 5: Stand by assistance Static Standing Balance Static Standing - Balance Support: During functional activity;Bilateral upper extremity supported Static Standing - Level of Assistance: 5: Stand by assistance Dynamic Standing Balance Dynamic Standing - Balance Support: During functional activity;Bilateral upper extremity supported Dynamic Standing - Level of Assistance: 4: Min assist Extremity/Trunk Assessment RUE Assessment RUE Assessment: Within Functional Limits LUE Assessment LUE Assessment:  Within Functional Limits  Care Tool Care Tool Self Care Eating   Eating Assist Level: Supervision/Verbal cueing    Oral Care    Oral Care Assist Level: Supervision/Verbal cueing    Bathing Bathing activity did not occur: Refused            Upper Body Dressing(including orthotics)   What is the patient wearing?: Dress   Assist Level: Supervision/Verbal cueing    Lower Body Dressing (excluding footwear)   What is the patient wearing?: Underwear/pull up Assist for lower body dressing: Moderate Assistance - Patient 50 - 74%    Putting on/Taking off footwear   What is the patient wearing?: Non-skid slipper socks Assist for footwear: Contact Guard/Touching assist       Care Tool Toileting Toileting activity   Assist for toileting: Maximal Assistance - Patient 25 - 49%     Care Tool Bed Mobility Roll left and right activity   Roll left and right assist level: Supervision/Verbal cueing    Sit to lying activity   Sit to lying assist level: Supervision/Verbal cueing    Lying to sitting on side of bed activity   Lying to sitting on side of bed assist level: the ability to move from lying on the back to sitting on the side of the bed with no back support.: Supervision/Verbal cueing     Care Tool Transfers Sit to stand transfer   Sit to stand assist level: Minimal Assistance - Patient > 75%    Chair/bed transfer   Chair/bed transfer assist level: Minimal Assistance - Patient > 75%     Toilet transfer   Assist Level: Minimal Assistance - Patient > 75%     Care Tool Cognition  Expression of Ideas and Wants Expression of Ideas and Wants: 4. Without difficulty (complex and basic) - expresses complex messages without difficulty and with speech that is clear and easy to understand  Understanding Verbal and Non-Verbal Content Understanding Verbal and Non-Verbal Content: 4. Understands (complex and basic) - clear comprehension without cues or repetitions   Memory/Recall  Ability Memory/Recall Ability : Current season;Staff names and faces;That he or she is in a hospital/hospital unit;Location of own room   Refer to Care Plan for Long Term Goals  SHORT TERM GOAL WEEK 1 OT Short Term Goal 1 (Week 1): Pt will complete LB dressing with CGA OT Short Term Goal 2 (Week 1): Pt wil complete toileting with Min A OT Short Term Goal 3 (Week 1): Pt will complete LB bathing with CGA  Recommendations for other  services: Adult nurse group   Skilled Therapeutic Intervention ADL ADL Eating: Supervision/safety Where Assessed-Eating: Edge of bed Grooming: Supervision/safety Where Assessed-Grooming: Sitting at sink Upper Body Bathing: Supervision/safety Where Assessed-Upper Body Bathing: Edge of bed Lower Body Bathing: Moderate assistance Where Assessed-Lower Body Bathing: Sitting at sink;Standing at sink Upper Body Dressing: Supervision/safety Where Assessed-Upper Body Dressing: Edge of bed Lower Body Dressing: Moderate assistance Where Assessed-Lower Body Dressing: Standing at sink;Sitting at sink Toileting: Moderate assistance Where Assessed-Toileting: Bedside Commode Toilet Transfer: Distant supervision Tub/Shower Transfer: Not assessed Film/video editor: Not assessed Mobility  Bed Mobility Bed Mobility: Sit to Supine;Supine to Sit;Rolling Right;Rolling Left Rolling Left: Supervision/Verbal cueing Supine to Sit: Supervision/Verbal cueing Sit to Supine: Supervision/Verbal cueing Transfers Sit to Stand: Minimal Assistance - Patient > 75% Stand to Sit: Minimal Assistance - Patient > 75%  Skilled OT eval completed.  OT POC created and OT goals set at Mod I.  Pt currently able to complete ADL tasks with Mod A with LB D/B. Min A  with functional transfer using the RW and Mod A with toileting. Pt is limited by current O2 reliance and has decreased endurance during ADL routine.  Pt left in bed lying supine, with bed alarm activated, call  bell within reach and all needs met.     Discharge Criteria: Patient will be discharged from OT if patient refuses treatment 3 consecutive times without medical reason, if treatment goals not met, if there is a change in medical status, if patient makes no progress towards goals or if patient is discharged from hospital.  The above assessment, treatment plan, treatment alternatives and goals were discussed and mutually agreed upon: by patient  Liam Graham 02/11/2023, 10:52 AM

## 2023-02-11 NOTE — Progress Notes (Addendum)
PROGRESS NOTE   Subjective/Complaints:  Overnight c/o abdominal cramps. Had small BM yesterday but still feeling constipated. Is passing flatus, more than yesterday.   Otherwise doing ok. Sleep is intermittent, somewhat disrupted, but this is her normal and she feels OK during daytime. Quickly exhausted with therapies but overall doing well from pain control standpoint.   Son at bedside, clarified he is working with Palliative care contact Sherlean Foot NP for OP hospice nursing referral; she is out until Monday. No needs from our team, no intent to place on hospice "yet", just setting up OP nursing and resources for "when things decline". Agreed this is an excellent plan.   ROS:  + abdominal cramping/constipation - ongoing + generalized weakness - ongoing Denies fevers, chills, N/V, abdominal pain, constipation, diarrhea, SOB, cough, chest pain, new weakness or paraesthesias.    Objective:   DG Abd 1 View  Result Date: 02/10/2023 CLINICAL DATA:  Nausea. EXAM: ABDOMEN - 1 VIEW COMPARISON:  CT 09/30/2015 FINDINGS: Divided portable supine views of the abdomen obtained. Mild gaseous gastric distension. No small bowel dilatation or obstruction. Small volume of formed colonic stool. Cholecystectomy clips in the right upper quadrant. No evidence of radiopaque calculi or soft tissue calcifications. The low pelvis is excluded from the field of view. Scoliosis in the spine. IMPRESSION: Mild gaseous gastric distension, nonspecific, can be seen with gastritis. Otherwise normal bowel gas pattern. Electronically Signed   By: Narda Rutherford M.D.   On: 02/10/2023 15:46   Recent Labs    02/11/23 0417  WBC 11.9*  HGB 12.5  HCT 37.7  PLT 284   Recent Labs    02/10/23 0556 02/11/23 0417  NA 132* 133*  K 3.8 3.4*  CL 88* 91*  CO2 33* 30  GLUCOSE 117* 137*  BUN 79* 78*  CREATININE 1.26* 1.55*  CALCIUM 9.2 9.1    Intake/Output Summary  (Last 24 hours) at 02/11/2023 1108 Last data filed at 02/11/2023 0837 Gross per 24 hour  Intake 680 ml  Output 101 ml  Net 579 ml        Physical Exam: Vital Signs Blood pressure (!) 118/57, pulse 84, temperature 97.7 F (36.5 C), temperature source Oral, resp. rate 14, height 5\' 6"  (1.676 m), weight (S) 78.4 kg, SpO2 97 %. Physical Exam Constitutional: No apparent distress. Appropriate appearance for age. +Obese. Sitting in bed.  HENT: No JVD. Neck Supple. Trachea midline. Atraumatic, normocephalic. Eyes: PERRLA. EOMI. Visual fields grossly intact.  Cardiovascular: RRR, no murmurs/rub/gallops. No Edema. Peripheral pulses 2+  Respiratory: Decreased bilaterally, fine crackles in bases.   + 2L [place] Abdomen: + Large hernia/diastasis +minimal bowel sounds - more than yesterday. Mildly TTP throughout, no rebound. Less distended than prior exam.  Skin: C/D/I. No apparent lesions. Chronic skin changes on B/L dorsal feet.  MSK:      +B/l collapsed arches +TTP L medial knee, no obvious effusion but mildly larged than R - improved TTP      Strength:                RUE: 5/5 SA, 5/5 EF, 5/5 EE, 5/5 WE, 5/5 FF, 5/5 FA  LUE: 5/5 SA, 5/5 EF, 5/5 EE, 5/5 WE, 5/5 FF, 5/5 FA                 RLE: 5/5 HF, 5/5 KE, 5/5 DF, 5/5 EHL, 5/5 PF                 LLE:  5/5 HF, 5/5 KE, 5/5 DF, 5/5 EHL, 5/5 PF    Neurologic exam:  Cognition: AAO to person, place, time and event.  Language: Fluent, No substitutions or neoglisms. No dysarthria.  Memory: Recalls 3/3 objects at 5 minutes. No apparent deficits  Insight: Good  insight into current condition.  Mood: Pleasant affect, appropriate mood.  Sensation: To light touch intact in BL UEs and LEs  Reflexes: 2+ in BL UE and LEs. Negative Hoffman's and babinski signs bilaterally.  CN: 2-12 grossly intact.  Coordination: No apparent tremors. No ataxia on FTN, HTS bilaterally.  Spasticity: MAS 0 in all extremities.    Assessment/Plan: 1. Functional  deficits which require 3+ hours per day of interdisciplinary therapy in a comprehensive inpatient rehab setting. Physiatrist is providing close team supervision and 24 hour management of active medical problems listed below. Physiatrist and rehab team continue to assess barriers to discharge/monitor patient progress toward functional and medical goals  Care Tool:  Bathing  Bathing activity did not occur: Refused           Bathing assist       Upper Body Dressing/Undressing Upper body dressing   What is the patient wearing?: Dress    Upper body assist Assist Level: Supervision/Verbal cueing    Lower Body Dressing/Undressing Lower body dressing      What is the patient wearing?: Underwear/pull up     Lower body assist Assist for lower body dressing: Moderate Assistance - Patient 50 - 74%     Toileting Toileting    Toileting assist Assist for toileting: Maximal Assistance - Patient 25 - 49%     Transfers Chair/bed transfer  Transfers assist     Chair/bed transfer assist level: Minimal Assistance - Patient > 75%     Locomotion Ambulation   Ambulation assist      Assist level: Minimal Assistance - Patient > 75% Assistive device: Walker-rolling Max distance: 10'   Walk 10 feet activity   Assist     Assist level: Minimal Assistance - Patient > 75% Assistive device: Walker-rolling   Walk 50 feet activity   Assist Walk 50 feet with 2 turns activity did not occur: Safety/medical concerns (Unable to ambulate >10' or perform stair mobility secondary to global deconditioning)         Walk 150 feet activity   Assist Walk 150 feet activity did not occur: Safety/medical concerns         Walk 10 feet on uneven surface  activity   Assist Walk 10 feet on uneven surfaces activity did not occur: Safety/medical concerns         Wheelchair     Assist Is the patient using a wheelchair?: Yes Type of Wheelchair: Manual Wheelchair activity  did not occur:  (Therapist dependently propelling manual wheelchair secondary to fatigue)  Wheelchair assist level: Dependent - Patient 0% Max wheelchair distance: 150    Wheelchair 50 feet with 2 turns activity    Assist        Assist Level: Dependent - Patient 0%   Wheelchair 150 feet activity     Assist      Assist Level: Dependent - Patient  0%   Blood pressure (!) 118/57, pulse 84, temperature 97.7 F (36.5 C), temperature source Oral, resp. rate 14, height 5\' 6"  (1.676 m), weight (S) 78.4 kg, SpO2 97 %.  Medical Problem List and Plan: 1. Functional deficits secondary to debility/CHF/acute hypoxic respiratory failure             -patient may shower             -ELOS/Goals: 10-14 days, SPV goals PT/OT  - 6/14: D/w family, SW plan for discharge to hospice nursing after IPR; no referrals sent while in acute. Family working with Palliative for OP referral; wilkl let us know if they need anything.    2.  Antithrombotics: -DVT/anticoagulation:  Pharmaceutical: Lovenox             -antiplatelet therapy: Aspirin 81 mg daily 3. Pain Management: Tramadol as needed 4. Mood/Behavior/Sleep: Melatonin 5 mg nightly             -antipsychotic agents: N/A 5. Neuropsych/cognition: This patient is capable of making decisions on her own behalf. 6. Skin/Wound Care: Routine skin checks 7. Fluids/Electrolytes/Nutrition: Routine in and outs with follow-up chemistries  - 6/14: Na low-stable 133 this AM; K 3.2 add K dur 20 meq BID x3 days; BUN/Cr stable 1.2-1.5  8.  COPD exacerbation/tobacco use.  Continue nebulizers as advised.  Check oxygen saturations every shift. Tobacco cessation education; does not want patches.    9.  Hypertension.  Lasix 40 mg twice daily, Imdur 60 mg daily, hydralazine Cozaar 12.5 mg daily, 10 mg every 8 hours Toprol-XL 25 mg daily.  Monitor with increased mobility   - 6/14: Mildly low today; monitor with diuresis, if symptomatic may need cards to assist in  adjusting      02/11/2023    2:20 PM 02/11/2023    9:48 AM 02/11/2023    7:00 AM  Vitals with BMI  Systolic 95 118 120  Diastolic 51 57 70  Pulse 77 84     10.  Diastolic congestive heart failure.  Ejection fraction 20 to 25%.  Continue diuresis.  Jardiance 25 mg daily.  Follow-up heart failure team. Daily weights.    - 6/14: Likely decreased d/t bed vs standing, monitor trend Filed Weights   02/10/23 1301 02/11/23 0404  Weight: 89.1 kg (S) 78.4 kg      11.  AKI on CKD/hyponatremia.  Follow-up chemistries.  Entresto on hold until renal function improved.    - Cr stable 1.5; has been 1.2-1.5; monitor BMP in AM d/t low BP and significant diuresis  12.  Hyperlipidemia.  Lipitor   13.  Type II diabetes mellitus.  Hemoglobin A1c 6.0.   - 6/14: BG stable, continue SSI and Jardiance Recent Labs    02/10/23 1636 02/10/23 2123 02/11/23 0632  GLUCAP 137* 138* 117*       14.  Constipation. Failed enema today. Passing gas. Check KUB - mild gaseous distention, small volume stool.  Adjust bowel program - Sennakot S 2 tabs BID, PRN sorbitol.    - 6/14: Small BM and smear yesterday. 1x sorbitol. Add Miralax daily to regimen. Add PRN suppository  LOS: 1 days A FACE TO FACE EVALUATION WAS PERFORMED  Angelina Sheriff 02/11/2023, 11:08 AM

## 2023-02-11 NOTE — Progress Notes (Signed)
Inpatient Rehabilitation  Patient information reviewed and entered into eRehab system by Shandelle Borrelli M. Malaiah Viramontes, M.A., CCC/SLP, PPS Coordinator.  Information including medical coding, functional ability and quality indicators will be reviewed and updated through discharge.    

## 2023-02-11 NOTE — Progress Notes (Signed)
Patient ID: Angela Lucero, female   DOB: 04/14/1941, 82 y.o.   MRN: [id number]  SW spoke with pt son Trey Paula to introduce self, explain role, and discuss discharge process. SW discussed discharge plan. Reports the plan is for his mother to discharge to home with hospice. SW informed will discuss hospice involvement with medical team. SW shared will follow-up with updates as available.   Cecile Sheerer, MSW, LCSWA Office: [phone] Cell: [phone] Fax: [phone]

## 2023-02-11 NOTE — Progress Notes (Addendum)
Is asleep at this time. KPAD arrived to unit. Earlier endorsing pain at "9" and following up at "9". Expressed abdominal cramps. Letting patient rest at this time once awake will assess if sleep relieved pain and if wanting to utilize heat as an intervention with cramps. No reports by patient of chest pain or discomfort outside when being repositioned reports pressure and difficulty catching breath if laying flat. Continue to keep head of bed elevated to patient's position of comfort.

## 2023-02-11 NOTE — Progress Notes (Signed)
Occupational Therapy Session Note  Patient Details  Name: Vianni Balicki MRN: [id number] Date of Birth: June 08, 1941  Today's Date: 02/11/2023 OT Individual Time: 0865-7846 OT Individual Time Calculation (min): 45 min  and Today's Date: 02/11/2023 OT Missed Time: 15 Minutes Missed Time Reason: Patient fatigue   Short Term Goals: Week 1:  OT Short Term Goal 1 (Week 1): Pt will complete LB dressing with CGA OT Short Term Goal 2 (Week 1): Pt wil complete toileting with Min A OT Short Term Goal 3 (Week 1): Pt will complete LB bathing with CGA  Skilled Therapeutic Interventions/Progress Updates:    Pt received supine with no c/o pain, agreeable to OT session. She reported high levels of fatigue and required encouragement to participate OOB. She completed bed mobility to EOB with (S) using bed rails. She sat EOB unsupported for 20 minutes with (S). She completed a functional activity tolerance circuit with BUE chest press holding a 3lb dowel and reciprocal hip flexion march with 4 lb weights on her ankles.3x10 repetitions before she requested to return to supine. From supine she completed BUE strengthening circuit- forward reach and bicep curl- 3x10 repetitions. Intermittent monitoring of SpO2- pt on 2L O2 at start of session and was able to be titrated down to 1L with no desaturation. She was left supine with all needs met, bed alarm set.   Therapy Documentation Precautions:  Precautions Precautions: Fall Precaution Comments: Monitor SpO2, L knee pain Restrictions Weight Bearing Restrictions: No   Therapy/Group: Individual Therapy  Crissie Reese 02/11/2023, 1:34 PM

## 2023-02-11 NOTE — Progress Notes (Signed)
Inpatient Rehabilitation Care Coordinator Assessment and Plan Patient Details  Name: Angela Lucero MRN: [id number] Date of Birth: March 20, 1941  Today's Date: 02/11/2023  Hospital Problems: Principal Problem:   Debility  Past Medical History:  Past Medical History:  Diagnosis Date   Arthritis 2010   Lower back and neck   CAD (coronary artery disease)    COPD (chronic obstructive pulmonary disease) (HCC)    CVA (cerebral vascular accident) (HCC)    1997, 2005 stents   Diabetes (HCC)    Diabetes mellitus, type II (HCC)    Gout 1998   Heart attack (HCC)    4098,1191   Hyperlipidemia    Hypertension    Hypertriglyceridemia    Past Surgical History:  Past Surgical History:  Procedure Laterality Date   APPENDECTOMY  1971   CHOLECYSTECTOMY     LEFT HEART CATH AND CORONARY ANGIOGRAPHY N/A 12/14/2021   Procedure: LEFT HEART CATH AND CORONARY ANGIOGRAPHY;  Surgeon: Yvonne Kendall, MD;  Location: [facility];  Service: Cardiovascular;  Laterality: N/A;   Navel hernia repair  2007   PARTIAL HYSTERECTOMY  1976   Stent Heart     TUBAL LIGATION  1974   Ulna nerve transpost L arm     Social History:  reports that she has been smoking cigarettes. She has been smoking an average of 1.5 packs per day. She has never used smokeless tobacco. She reports that she does not currently use alcohol. She reports that she does not use drugs.  Family / Support Systems Marital Status: Widow/Widower How Long?: 12 years Patient Roles: Parent Spouse/Significant Other: Widowed Children: 3 children- Trey Paula (son), and John and Melissa- no contact Other Supports: neighbor- Chrissy Anticipated Caregiver: intermittnet support from son and neighbor Ability/Limitations of Caregiver: Pt reports that her plan is to return home, and will have intermittent support from her neighbor who will check in on her once a day, and her son will help as well. Caregiver Availability: Intermittent Family Dynamics: Pt lives  alone. She has her groceries delivered to her home, and only leaves for appointments. States her neighbor comes by to check on her as well.  Social History Preferred language: English Religion:  Cultural Background: Pt reports she worked in [employer] for "all of her life," in various roles. States she stopped working after her first heart attack at age 45. Education: high school grad Health Literacy - How often do you need to have someone help you when you read instructions, pamphlets, or other written material from your doctor or pharmacy?: Never Writes: Yes Employment Status: Retired Date Retired/Disabled/Unemployed: Retired Age Retired: 58 Marine scientist Issues: Denies Guardian/Conservator: No HCPOA. Designates her son Trey Paula to make decisions. Nursing will place order with chaplain for advanced care directive.   Abuse/Neglect Abuse/Neglect Assessment Can Be Completed: Yes Physical Abuse: Denies Verbal Abuse: Denies Sexual Abuse: Denies Exploitation of patient/patient's resources: Denies Self-Neglect: Denies  Patient response to: Social Isolation - How often do you feel lonely or isolated from those around you?: Never  Emotional Status Pt's affect, behavior and adjustment status: Pt in good spirits at time of visit despite being tired. She is ready to go home, and also wants to make progress in rehab as well. Recent Psychosocial Issues: Denies Psychiatric History: Denies Substance Abuse History: Pt admits that she smokes 2pk cigarettes daily and plans to quit after this hospitalization. Deniess etoh and rec drug use.  Patient / Family Perceptions, Expectations & Goals Pt/Family understanding of illness & functional  limitations: Pt and pt son have a general understanding of pt care needs Premorbid pt/family roles/activities: Independent Anticipated changes in roles/activities/participation: Assistance with ADLs/IADLs Pt/family expectations/goals: pt goal  includes "getting better."  [employer]: None Premorbid Home Care/DME Agencies: None Transportation available at discharge: TBD Is the patient able to respond to transportation needs?: Yes In the past 12 months, has lack of transportation kept you from medical appointments or from getting medications?: No In the past 12 months, has lack of transportation kept you from meetings, work, or from getting things needed for daily living?: No Resource referrals recommended: Neuropsychology  Discharge Planning Living Arrangements: Alone Support Systems: Children, Friends/neighbors Type of Residence: Private residence Insurance Resources: Media planner (specify), Medicare ([place]) Financial Resources: Social Security Financial Screen Referred: No Living Expenses: Own Money Management: Patient Does the patient have any problems obtaining your medications?: No Home Management: Pt manages all of her own care needs. Patient/Family Preliminary Plans: TBD Care Coordinator Barriers to Discharge: Inaccessible home environment, Decreased caregiver support, Lack of/limited family support, Insurance for SNF coverage, New oxygen, Other (comments) (likely to discharge to home with hospice) Care Coordinator Anticipated Follow Up Needs: HH/OP  Clinical Impression SW met with pt in room to introduce self, explain role and discuss discharge process. Pt is not a Cytogeneticist. No HCPOA. DME: RW and cane. States she only uses in the community.   Kimisha Eunice A Aliyyah Riese 02/11/2023, 11:06 AM

## 2023-02-11 NOTE — Plan of Care (Signed)
Problem: RH Balance Goal: LTG Patient will maintain dynamic standing balance (PT) Description: LTG:  Patient will maintain dynamic standing balance with assistance during mobility activities (PT) Flowsheets (Taken 02/11/2023 1225) LTG: Pt will maintain dynamic standing balance during mobility activities with:: Independent with assistive device    Problem: Sit to Stand Goal: LTG:  Patient will perform sit to stand with assistance level (PT) Description: LTG:  Patient will perform sit to stand with assistance level (PT) Flowsheets (Taken 02/11/2023 1225) LTG: PT will perform sit to stand in preparation for functional mobility with assistance level: Independent with assistive device   Problem: RH Bed Mobility Goal: LTG Patient will perform bed mobility with assist (PT) Description: LTG: Patient will perform bed mobility with assistance, with/without cues (PT). Flowsheets (Taken 02/11/2023 1225) LTG: Pt will perform bed mobility with assistance level of: Independent with assistive device    Problem: RH Bed to Chair Transfers Goal: LTG Patient will perform bed/chair transfers w/assist (PT) Description: LTG: Patient will perform bed to chair transfers with assistance (PT). Flowsheets (Taken 02/11/2023 1225) LTG: Pt will perform Bed to Chair Transfers with assistance level: Independent with assistive device    Problem: RH Car Transfers Goal: LTG Patient will perform car transfers with assist (PT) Description: LTG: Patient will perform car transfers with assistance (PT). Flowsheets (Taken 02/11/2023 1225) LTG: Pt will perform car transfers with assist:: Supervision/Verbal cueing   Problem: RH Furniture Transfers Goal: LTG Patient will perform furniture transfers w/assist (OT/PT) Description: LTG: Patient will perform furniture transfers  with assistance (OT/PT). Flowsheets (Taken 02/11/2023 1225) LTG: Pt will perform furniture transfers with assist:: Independent with assistive device     Problem: RH Ambulation Goal: LTG Patient will ambulate in home environment (PT) Description: LTG: Patient will ambulate in home environment, # of feet with assistance (PT). Flowsheets (Taken 02/11/2023 1225) LTG: Pt will ambulate in home environ  assist needed:: Independent with assistive device LTG: Ambulation distance in home environment: 50' Goal: LTG Patient will ambulate in community environment (PT) Description: LTG: Patient will ambulate in community environment, # of feet with assistance (PT). Flowsheets (Taken 02/11/2023 1225) LTG: Pt will ambulate in community environ  assist needed:: Supervision/Verbal cueing LTG: Ambulation distance in community environment: 150'   Problem: RH Stairs Goal: LTG Patient will ambulate up and down stairs w/assist (PT) Description: LTG: Patient will ambulate up and down # of stairs with assistance (PT) Flowsheets (Taken 02/11/2023 1225) LTG: Pt will ambulate up/down stairs assist needed:: Supervision/Verbal cueing LTG: Pt will  ambulate up and down number of stairs: 4

## 2023-02-11 NOTE — Progress Notes (Signed)
Physical Therapy Assessment and Plan  Patient Details  Name: Angela Lucero MRN: [id number] Date of Birth: 1941-07-15  PT Diagnosis: Abnormality of gait, Difficulty walking, and Pain in Lt knee Rehab Potential: Good ELOS: 10-14   Today's Date: 02/11/2023 PT Individual Time: 4098-1191 PT Individual Time Calculation (min): 60 min    Hospital Problem: Principal Problem:   Debility   Past Medical History:  Past Medical History:  Diagnosis Date   Arthritis 2010   Lower back and neck   CAD (coronary artery disease)    COPD (chronic obstructive pulmonary disease) (HCC)    CVA (cerebral vascular accident) (HCC)    1997, 2005 stents   Diabetes (HCC)    Diabetes mellitus, type II (HCC)    Gout 1998   Heart attack (HCC)    [id number]   Hyperlipidemia    Hypertension    Hypertriglyceridemia    Past Surgical History:  Past Surgical History:  Procedure Laterality Date   APPENDECTOMY  1971   CHOLECYSTECTOMY     LEFT HEART CATH AND CORONARY ANGIOGRAPHY N/A 12/14/2021   Procedure: LEFT HEART CATH AND CORONARY ANGIOGRAPHY;  Surgeon: Yvonne Kendall, MD;  Location: [facility];  Service: Cardiovascular;  Laterality: N/A;   Navel hernia repair  2007   PARTIAL HYSTERECTOMY  1976   Stent Heart     TUBAL LIGATION  1974   Ulna nerve transpost L arm      Assessment & Plan Clinical Impression: Patient is a 82 year old right handed female with H/O CAD with left heart cath 12/14/2021,, diastolic congestive heart failure with ejection fraction of 30 to 35%, COPD/tobacco use maintained on aspirin,CVA 1997 without residual deficits , CKD stage III with creatinine 1.32-1.60 HTN,HLD, type diabetes mellitus. Chart review patient lives alone.  1 level home 3 steps to entry.  Independent prior to admission.  Presented 02/04/2023 with  progressive shortness of breath as well as sharp acute chest pain.  Admission chemistries BNP 3951, troponin 147-136, sodium 122, chloride 88, glucose 43, BUN 45,  creatinine 1.89, lactic acid within normal limits, globin A1c 6.0.  EKG did not show any evidence of acute ischemia.  Patient did receive 2 nitroglycerin tablets and chest pain subsided.  Chest x-ray hazy bibasilar airspace opacities representing atelectasis and/or infection.  Echo with ejection fraction of 20 to 25%.  Global hypokinesis..  Cardiology consulted for acute on chronic systolic congestive heart failure with conservative care and presently maintained on Jardiance as well as Lasix 40 mg twice daily as per heart failure team.  Sherryll Burger remains on hold for AKI considering restarting if creatinine improves.  Lovenox has been added for DVT prophylaxis.  Patient initially and oxygen demand and slowly has been weaned.  Mild elevation in creatinine to 1.81 felt to be related to diuresis and monitor closely.  Hyponatremia 122-126.  Palliative care consulted to establish goals of care.  Patient with reported constipation.  Therapy evaluations completed due to patient debility and decreased functional mobility was admitted for a comprehensive rehab program.  Patient currently requires min with mobility secondary to muscle weakness, decreased cardiorespiratoy endurance and decreased oxygen support, and decreased standing balance and decreased balance strategies.  Prior to hospitalization, patient was independent  with mobility and lived with Alone in a Mobile home home.  Home access is 3Stairs to enter.  Patient will benefit from skilled PT intervention to maximize safe functional mobility, minimize fall risk, and decrease caregiver burden for planned discharge home with intermittent assist.  Anticipate  patient will benefit from follow up HH at discharge.  PT - End of Session Activity Tolerance: Tolerates < 10 min activity, no significant change in vital signs Endurance Deficit: Yes Endurance Deficit Description: Global deconditioning with significantly poor endurance/activity tolerance PT  Assessment Rehab Potential (ACUTE/IP ONLY): Good PT Barriers to Discharge: Decreased caregiver support;Home environment access/layout;Weight;Lack of/limited family support PT Patient demonstrates impairments in the following area(s): Balance;Edema;Endurance;Motor PT Transfers Functional Problem(s): Bed Mobility;Bed to Chair;Car;Furniture PT Locomotion Functional Problem(s): Ambulation;Wheelchair Mobility;Stairs PT Plan PT Intensity: Minimum of 1-2 x/day ,45 to 90 minutes PT Frequency: 5 out of 7 days PT Duration Estimated Length of Stay: 10-14 PT Treatment/Interventions: Ambulation/gait training;Community reintegration;DME/adaptive equipment instruction;Psychosocial support;Stair training;UE/LE Strength taining/ROM;Wheelchair propulsion/positioning;Balance/vestibular training;Discharge planning;Functional electrical stimulation;Pain management;Skin care/wound management;Therapeutic Activities;UE/LE Coordination activities;Disease management/prevention;Functional mobility training;Patient/family education;Therapeutic Exercise;Visual/perceptual remediation/compensation PT Transfers Anticipated Outcome(s): ModI with LRAD PT Locomotion Anticipated Outcome(s): ModI/Supv with LRAD PT Recommendation Recommendations for Other Services: Therapeutic Recreation consult;Neuropsych consult Therapeutic Recreation Interventions: Stress management;Outing/community reintergration Follow Up Recommendations: Outpatient PT (Pending transportation) Patient destination: Home Equipment Recommended: To be determined Equipment Details: TBD   PT Evaluation Precautions/Restrictions Precautions Precautions: Fall Precaution Comments: Monitor SpO2, L knee pain Restrictions Weight Bearing Restrictions: No General   Vital SignsTherapy Vitals Pulse Rate: 84 BP: (!) 118/57 Oxygen Therapy SpO2: 97 % O2 Device: Nasal Cannula O2 Flow Rate (L/min): 2 L/min Pain Pain Assessment Pain Scale: 0-10 Pain Score: 0-No  pain Pain Interference Pain Interference Pain Effect on Sleep: 2. Occasionally Pain Interference with Therapy Activities: 1. Rarely or not at all Pain Interference with Day-to-Day Activities: 1. Rarely or not at all Home Living/Prior Functioning Home Living Available Help at Discharge: Family;Friend(s) Type of Home: Mobile home Home Access: Stairs to enter Entrance Stairs-Number of Steps: 3 Entrance Stairs-Rails: Left Home Layout: One level Bathroom Shower/Tub: Engineer, manufacturing systems: Standard Bathroom Accessibility: Yes Additional Comments: Patient reports she has used the walker to access the bathroom prior  Lives With: Alone Prior Function Level of Independence: Independent with basic ADLs;Independent with homemaking with ambulation;Independent with gait;Independent with transfers  Able to Take Stairs?: Yes Driving: No (Hasn't driven for over a year, per patient report) 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 Leisure: Hobbies-yes (Comment) (Watch sports- [employer] basketball and [employer]) Vision/Perception  Vision - History Ability to See in Adequate Light: 0 Adequate Perception Perception: Within Functional Limits Praxis Praxis: Intact  Cognition Overall Cognitive Status: Within Functional Limits for tasks assessed Arousal/Alertness: Awake/alert Orientation Level: Oriented X4 Year: 2024 Month: June Day of Week: Correct Memory: Appears intact Awareness: Appears intact Problem Solving: Appears intact Safety/Judgment: Appears intact Sensation Sensation Light Touch: Appears Intact Hot/Cold: Appears Intact Coordination Gross Motor Movements are Fluid and Coordinated: No Fine Motor Movements are Fluid and Coordinated: Yes Coordination and Movement Description: Global deconditioning Motor  Motor Motor: Other (comment) Motor - Skilled Clinical Observations: generalized weakness with poor endurance/activity tolerance    Trunk/Postural Assessment  Cervical Assessment Cervical Assessment: Within Functional Limits Thoracic Assessment Thoracic Assessment: Within Functional Limits Lumbar Assessment Lumbar Assessment: Exceptions to [facility] (Posterior pelvic tilt) Postural Control Postural Control: Deficits on evaluation Righting Reactions: delayed Protective Responses: delayed  Balance Balance Balance Assessed: Yes Static Sitting Balance Static Sitting - Balance Support: Feet supported Static Sitting - Level of Assistance: 6: Modified independent (Device/Increase time) Dynamic Sitting Balance Dynamic Sitting - Balance Support: Feet supported Dynamic Sitting - Level of Assistance: 5: Stand by assistance Static Standing Balance Static Standing - Balance Support: During functional activity;Bilateral upper extremity  supported Static Standing - Level of Assistance: 5: Stand by assistance Dynamic Standing Balance Dynamic Standing - Balance Support: During functional activity;Bilateral upper extremity supported Dynamic Standing - Level of Assistance: 4: Min assist Extremity Assessment  RUE Assessment RUE Assessment: Within Functional Limits LUE Assessment LUE Assessment: Within Functional Limits RLE Assessment RLE Assessment: Exceptions to [facility] General Strength Comments: Global strength- 3+/5 LLE Assessment LLE Assessment: Exceptions to [facility] General Strength Comments: Global strength- 3+/5 but limited by knee pain  Care Tool Care Tool Bed Mobility Roll left and right activity   Roll left and right assist level: Supervision/Verbal cueing    Sit to lying activity   Sit to lying assist level: Supervision/Verbal cueing    Lying to sitting on side of bed activity   Lying to sitting on side of bed assist level: the ability to move from lying on the back to sitting on the side of the bed with no back support.: Supervision/Verbal cueing     Care Tool Transfers Sit to stand transfer   Sit to stand assist  level: Minimal Assistance - Patient > 75%    Chair/bed transfer   Chair/bed transfer assist level: Minimal Assistance - Patient > 75%     Toilet transfer   Assist Level: Minimal Assistance - Patient > 75%    Car transfer   Car transfer assist level: Minimal Assistance - Patient > 75%      Care Tool Locomotion Ambulation   Assist level: Minimal Assistance - Patient > 75% Assistive device: Walker-rolling Max distance: 10'  Walk 10 feet activity   Assist level: Minimal Assistance - Patient > 75% Assistive device: Walker-rolling   Walk 50 feet with 2 turns activity Walk 50 feet with 2 turns activity did not occur: Safety/medical concerns (Unable to ambulate >10' or perform stair mobility secondary to global deconditioning)      Walk 150 feet activity Walk 150 feet activity did not occur: Safety/medical concerns      Walk 10 feet on uneven surfaces activity Walk 10 feet on uneven surfaces activity did not occur: Safety/medical concerns      Stairs Stair activity did not occur: Safety/medical concerns        Walk up/down 1 step activity Walk up/down 1 step or curb (drop down) activity did not occur: Safety/medical concerns      Walk up/down 4 steps activity Walk up/down 4 steps activity did not occur: Safety/medical concerns      Walk up/down 12 steps activity Walk up/down 12 steps activity did not occur: Safety/medical concerns      Pick up small objects from floor   Pick up small object from the floor assist level: Minimal Assistance - Patient > 75% Pick up small object from the floor assistive device: RW  Wheelchair Is the patient using a wheelchair?: Yes Type of Wheelchair: Manual Wheelchair activity did not occur:  (Therapist dependently propelling manual wheelchair secondary to fatigue) Wheelchair assist level: Dependent - Patient 0% Max wheelchair distance: 150  Wheel 50 feet with 2 turns activity   Assist Level: Dependent - Patient 0%  Wheel 150 feet activity    Assist Level: Dependent - Patient 0%    Refer to Care Plan for Long Term Goals  SHORT TERM GOAL WEEK 1 PT Short Term Goal 1 (Week 1): Patient will initiate stair mobility PT Short Term Goal 2 (Week 1): Patient will ambulate >50' with LRAD and MinA PT Short Term Goal 3 (Week 1): Patient will tolerate staying out of  the bed between therapy sessions  Recommendations for other services: Neuropsych and Therapeutic Recreation  Pet therapy, Kitchen group, and Stress management  Skilled Therapeutic Intervention Mobility Bed Mobility Bed Mobility: Sit to Supine;Supine to Sit;Rolling Right;Rolling Left Rolling Left: Supervision/Verbal cueing Supine to Sit: Supervision/Verbal cueing Sit to Supine: Supervision/Verbal cueing Transfers Transfers: Sit to Stand;Stand to Sit;Stand Pivot Transfers Sit to Stand: Minimal Assistance - Patient > 75% Stand to Sit: Minimal Assistance - Patient > 75% Stand Pivot Transfers: Minimal Assistance - Patient > 75% Stand Pivot Transfer Details: Verbal cues for safe use of DME/AE Transfer (Assistive device): Rolling walker Locomotion  Gait Ambulation: Yes Gait Assistance: Minimal Assistance - Patient > 75% Gait Distance (Feet): 10 Feet Assistive device: Rolling walker Gait Assistance Details: Verbal cues for sequencing;Verbal cues for safe use of DME/AE;Verbal cues for precautions/safety Gait Gait: Yes Gait Pattern: Impaired (Flexed trunk with decreased B step length) Gait velocity: decr Stairs / Additional Locomotion Stairs: No Wheelchair Mobility Wheelchair Mobility: Yes Wheelchair Assistance: Total Assistance - Patient <25% Wheelchair Parts Management: Needs assistance Distance: 150' by therapist secondary to poor endurance/activity tolerance  Skilled Intervention- Patient greeted supine in bed and agreeable to PT treatment session- Patient remained on 2L oxygen throughout treatment session via [place] with SpO2 >98% even with exertion, however required  cues for pursed lip breathing. Therapist managed oxygen tank and line throughout treatment session. Evaluation completed (see details above and below) with education on PT POC and goals and individual treatment initiated with focus on bed mobility, transfers, gait, endurance/activity tolerance, global strengthening, education, discharge planning, etc. Patient completed all functional mobility tasks with the use of a RW and MinA overall- Limited significantly by endurance/activity tolerance. Patient returned to her room and left supine in bed with bed alarm on, call bell within reach and all needs met.    Discharge Criteria: Patient will be discharged from PT if patient refuses treatment 3 consecutive times without medical reason, if treatment goals not met, if there is a change in medical status, if patient makes no progress towards goals or if patient is discharged from hospital.  The above assessment, treatment plan, treatment alternatives and goals were discussed and mutually agreed upon: by patient  [place]  Herchel Hopkin 02/11/2023, 10:42 AM

## 2023-02-12 DIAGNOSIS — R5381 Other malaise: Secondary | ICD-10-CM

## 2023-02-12 DIAGNOSIS — K5901 Slow transit constipation: Secondary | ICD-10-CM

## 2023-02-12 LAB — BASIC METABOLIC PANEL
Anion gap: 12 (ref 5–15)
BUN: 66 mg/dL — ABNORMAL HIGH (ref 8–23)
CO2: 30 mmol/L (ref 22–32)
Calcium: 8.9 mg/dL (ref 8.9–10.3)
Chloride: 90 mmol/L — ABNORMAL LOW (ref 98–111)
Creatinine, Ser: 1.38 mg/dL — ABNORMAL HIGH (ref 0.44–1.00)
GFR, Estimated: 39 mL/min — ABNORMAL LOW (ref >60)
Glucose, Bld: 124 mg/dL — ABNORMAL HIGH (ref 70–99)
Potassium: 4.1 mmol/L (ref 3.5–5.1)
Sodium: 132 mmol/L — ABNORMAL LOW (ref 135–145)

## 2023-02-12 LAB — GLUCOSE, CAPILLARY
Glucose-Capillary: 120 mg/dL — ABNORMAL HIGH (ref 70–99)
Glucose-Capillary: 199 mg/dL — ABNORMAL HIGH (ref 70–99)
Glucose-Capillary: 138 mg/dL — ABNORMAL HIGH (ref 70–99)
Glucose-Capillary: 150 mg/dL — ABNORMAL HIGH (ref 70–99)

## 2023-02-12 MED ORDER — POTASSIUM CHLORIDE CRYS ER 20 MEQ PO TBCR
20.0000 meq | EXTENDED_RELEASE_TABLET | Freq: Every day | ORAL | Status: AC
  Administered 2023-02-13 – 2023-02-14 (×2): 20 meq via ORAL
  Filled 2023-02-12 (×2): qty 1

## 2023-02-12 NOTE — Progress Notes (Signed)
PROGRESS NOTE   Subjective/Complaints:  Slept great! Denies pain. LBM this morning. Urinating ok. Denies any other complaints or concerns today.   ROS:  + abdominal cramping/constipation - resolved + generalized weakness - ongoing Denies fevers, chills, N/V, abdominal pain, diarrhea, SOB, cough, chest pain, new weakness or paraesthesias.    Objective:   DG Abd 1 View  Result Date: 02/10/2023 CLINICAL DATA:  Nausea. EXAM: ABDOMEN - 1 VIEW COMPARISON:  CT 09/30/2015 FINDINGS: Divided portable supine views of the abdomen obtained. Mild gaseous gastric distension. No small bowel dilatation or obstruction. Small volume of formed colonic stool. Cholecystectomy clips in the right upper quadrant. No evidence of radiopaque calculi or soft tissue calcifications. The low pelvis is excluded from the field of view. Scoliosis in the spine. IMPRESSION: Mild gaseous gastric distension, nonspecific, can be seen with gastritis. Otherwise normal bowel gas pattern. Electronically Signed   By: Narda Rutherford M.D.   On: 02/10/2023 15:46   Recent Labs    02/11/23 0417  WBC 11.9*  HGB 12.5  HCT 37.7  PLT 284   Recent Labs    02/11/23 0417 02/12/23 0531  NA 133* 132*  K 3.4* 4.1  CL 91* 90*  CO2 30 30  GLUCOSE 137* 124*  BUN 78* 66*  CREATININE 1.55* 1.38*  CALCIUM 9.1 8.9    Intake/Output Summary (Last 24 hours) at 02/12/2023 1235 Last data filed at 02/12/2023 0700 Gross per 24 hour  Intake 358 ml  Output --  Net 358 ml        Physical Exam: Vital Signs Blood pressure (!) 125/58, pulse 92, temperature 97.8 F (36.6 C), temperature source Oral, resp. rate 18, height 5\' 6"  (1.676 m), weight 78.8 kg, SpO2 97 %. Physical Exam Constitutional: No apparent distress. Appropriate appearance for age. +Obese. Sitting in w/c returning from therapy.  HENT: No JVD. Neck Supple. Trachea midline. Atraumatic, normocephalic. Eyes: PERRLA. EOMI.  Visual fields grossly intact.  Cardiovascular: RRR, no murmurs/rub/gallops. No Edema. Peripheral pulses 2+  Respiratory: Faintly decreased bilaterally, no wheeze/rales appreciated today.   + 2L [place] Abdomen: + Large hernia/diastasis, slightly hypoactive but approaching normoactive bowel sounds throughout. Not really any focal tenderness, no rebound. Slightly protuberant but not really distended.   PRIOR EXAMS:  Skin: C/D/I. No apparent lesions. Chronic skin changes on B/L dorsal feet.  MSK:      +B/l collapsed arches +TTP L medial knee, no obvious effusion but mildly larged than R - improved TTP      Strength:                RUE: 5/5 SA, 5/5 EF, 5/5 EE, 5/5 WE, 5/5 FF, 5/5 FA                 LUE: 5/5 SA, 5/5 EF, 5/5 EE, 5/5 WE, 5/5 FF, 5/5 FA                 RLE: 5/5 HF, 5/5 KE, 5/5 DF, 5/5 EHL, 5/5 PF                 LLE:  5/5 HF, 5/5 KE, 5/5 DF, 5/5 EHL, 5/5 PF  Neurologic exam:  Cognition: AAO to person, place, time and event.  Language: Fluent, No substitutions or neoglisms. No dysarthria.  Memory: Recalls 3/3 objects at 5 minutes. No apparent deficits  Insight: Good  insight into current condition.  Mood: Pleasant affect, appropriate mood.  Sensation: To light touch intact in BL UEs and LEs  Reflexes: 2+ in BL UE and LEs. Negative Hoffman's and babinski signs bilaterally.  CN: 2-12 grossly intact.  Coordination: No apparent tremors. No ataxia on FTN, HTS bilaterally.  Spasticity: MAS 0 in all extremities.    Assessment/Plan: 1. Functional deficits which require 3+ hours per day of interdisciplinary therapy in a comprehensive inpatient rehab setting. Physiatrist is providing close team supervision and 24 hour management of active medical problems listed below. Physiatrist and rehab team continue to assess barriers to discharge/monitor patient progress toward functional and medical goals  Care Tool:  Bathing  Bathing activity did not occur: Refused           Bathing assist        Upper Body Dressing/Undressing Upper body dressing   What is the patient wearing?: Dress    Upper body assist Assist Level: Supervision/Verbal cueing    Lower Body Dressing/Undressing Lower body dressing      What is the patient wearing?: Underwear/pull up     Lower body assist Assist for lower body dressing: Moderate Assistance - Patient 50 - 74%     Toileting Toileting    Toileting assist Assist for toileting: Maximal Assistance - Patient 25 - 49%     Transfers Chair/bed transfer  Transfers assist     Chair/bed transfer assist level: Minimal Assistance - Patient > 75%     Locomotion Ambulation   Ambulation assist      Assist level: Minimal Assistance - Patient > 75% Assistive device: Walker-rolling Max distance: 10'   Walk 10 feet activity   Assist     Assist level: Minimal Assistance - Patient > 75% Assistive device: Walker-rolling   Walk 50 feet activity   Assist Walk 50 feet with 2 turns activity did not occur: Safety/medical concerns (Unable to ambulate >10' or perform stair mobility secondary to global deconditioning)         Walk 150 feet activity   Assist Walk 150 feet activity did not occur: Safety/medical concerns         Walk 10 feet on uneven surface  activity   Assist Walk 10 feet on uneven surfaces activity did not occur: Safety/medical concerns         Wheelchair     Assist Is the patient using a wheelchair?: Yes Type of Wheelchair: Manual Wheelchair activity did not occur:  (Therapist dependently propelling manual wheelchair secondary to fatigue)  Wheelchair assist level: Dependent - Patient 0% Max wheelchair distance: 150    Wheelchair 50 feet with 2 turns activity    Assist        Assist Level: Dependent - Patient 0%   Wheelchair 150 feet activity     Assist      Assist Level: Dependent - Patient 0%   Blood pressure (!) 125/58, pulse 92, temperature 97.8 F (36.6 C),  temperature source Oral, resp. rate 18, height 5\' 6"  (1.676 m), weight 78.8 kg, SpO2 97 %.  Medical Problem List and Plan: 1. Functional deficits secondary to debility/CHF/acute hypoxic respiratory failure             -patient may shower             -  ELOS/Goals: 10-14 days, SPV goals PT/OT - 6/14: D/w family, SW plan for discharge to hospice nursing after IPR; no referrals sent while in acute. Family working with Palliative for OP referral; will let us know if they need anything.    2.  Antithrombotics: -DVT/anticoagulation:  Pharmaceutical: Lovenox 40mg  QD             -antiplatelet therapy: Aspirin 81 mg daily 3. Pain Management: Tramadol as needed 4. Mood/Behavior/Sleep: Melatonin 5 mg nightly--sleeping well, cont regimen             -antipsychotic agents: N/A 5. Neuropsych/cognition: This patient is capable of making decisions on her own behalf. 6. Skin/Wound Care: Routine skin checks 7. Fluids/Electrolytes/Nutrition: Routine in and outs with follow-up chemistries - 6/14: Na low-stable 133 this AM; K 3.2 add K dur 20 meq BID x3 days; BUN/Cr stable 1.2-1.5 -02/12/23 Na stable at 132, K 4.1, BUN/Cr improving, will decrease Kdur to QD x2 more days for now and recheck on routine labs on Monday   8.  COPD exacerbation/tobacco use.  Continue nebulizers as advised.  Check oxygen saturations every shift. Tobacco cessation education; does not want patches.    9.  Hypertension.  Lasix 40 mg twice daily, Imdur 60 mg daily, Cozaar 12.5 mg daily, Hydralazine 10 mg every 8 hours, Toprol-XL 25 mg daily.  Monitor with increased mobility - 6/14: Mildly low today; monitor with diuresis, if symptomatic may need cards to assist in adjusting -02/12/23 BPs uptrending, monitor for now   Vitals:   02/10/23 2301 02/11/23 0358 02/11/23 0700 02/11/23 0948  BP: 124/62 (!) 104/55 120/70 (!) 118/57   02/11/23 1420 02/11/23 2006 02/11/23 2059 02/11/23 2229  BP: (!) 95/51 120/63 (!) 112/59 (!) 102/46   02/11/23 2304  02/12/23 0445 02/12/23 0627 02/12/23 0805  BP: (!) 99/51 (!) 107/53 (!) 104/54 (!) 125/58     10.  Diastolic congestive heart failure.  Ejection fraction 20 to 25%.  Continue diuresis.  Jardiance 25 mg daily.  Follow-up heart failure team. Daily weights.    - 6/14: Likely decreased d/t bed vs standing, monitor trend  -02/12/23 wt stable from yesterday, cont monitoring Filed Weights   02/10/23 1301 02/11/23 0404 02/12/23 0445  Weight: 89.1 kg (S) 78.4 kg 78.8 kg      11.  AKI on CKD/hyponatremia.  Follow-up chemistries.  Entresto on hold until renal function improved. - Cr stable 1.5; has been 1.2-1.5; monitor BMP in AM d/t low BP and significant diuresis  12.  Hyperlipidemia.  Lipitor 80mg  QD   13.  Type II diabetes mellitus.  Hemoglobin A1c 6.0.   - 6/14: BG stable, continue SSI and Jardiance 25mg  every day  -02/12/23 CBGs stable, monitor Recent Labs    02/11/23 2132 02/12/23 0600 02/12/23 1147  GLUCAP 143* 120* 199*       14.  Constipation. Failed enema today. Passing gas. Check KUB - mild gaseous distention, small volume stool.  Adjust bowel program - Sennakot S 2 tabs BID, PRN sorbitol.   - 6/14: Small BM and smear yesterday. 1x sorbitol. Add Miralax daily to regimen. Add PRN suppository -02/12/23 LBM this morning, monitor  LOS: 2 days A FACE TO FACE EVALUATION WAS PERFORMED  [address] 02/12/2023, 12:35 PM

## 2023-02-12 NOTE — Progress Notes (Signed)
Physical Therapy Session Note  Patient Details  Name: Angela Lucero MRN: [id number] Date of Birth: 04/18/1941  Today's Date: 02/12/2023 PT Individual Time: 1400-1435 PT Individual Time Calculation (min): 35 min   Short Term Goals: Week 1:  PT Short Term Goal 1 (Week 1): Patient will initiate stair mobility PT Short Term Goal 2 (Week 1): Patient will ambulate >50' with LRAD and MinA PT Short Term Goal 3 (Week 1): Patient will tolerate staying out of the bed between therapy sessions  Skilled Therapeutic Interventions/Progress Updates:   Pt presents sidelying and agreeable to therapy "as much as I am able to."  Pt rolls and then transfers sidelying to sit w/ supervision, slow progression.  Pt requires verbal cues for breathing technique during transition.  O2 sats remained > 98%.  Pt transferred sit to stand w/ min A and then step-pivots to w/c w/ RW and min A.  Pt wheeled to dayroom for energy conservation.  Pt performed LE there ex of AP, LAQ, marching 3 x 10 w/ extended rest breaks provided 2/2 fatigue.  Pt encouraged to participate further but unable to continue.  O2 sats on 2LPM O2 remained > 97% throughout session.  Pt returned to room and amb x 3-4' w/ RW to bed.  Pt performed sit to supine w/ CGA for LES 2/2 fatigue.  Pt remained in bed w/ bed alarm on and all needs in reach, ice water brought per request.  Time missed 10' 2/2 fatigue.     Therapy Documentation Precautions:  Precautions Precautions: Fall Precaution Comments: Monitor SpO2, L knee pain Restrictions Weight Bearing Restrictions: No General: PT Amount of Missed Time (min): 10 Minutes PT Missed Treatment Reason: Patient fatigue Vital Signs:  Pain:  pt states unrated pain L knee at conclusion of available therapy.      Therapy/Group: Individual Therapy  Lucio Edward 02/12/2023, 2:42 PM

## 2023-02-12 NOTE — Progress Notes (Addendum)
Physical Therapy Session Note  Patient Details  Name: Angela Lucero MRN: [id number] Date of Birth: 08/19/1941  Today's Date: 02/12/2023 PT Individual Time: 0820-0905 PT Individual Time Calculation (min): 45 min   Short Term Goals: Week 1:  PT Short Term Goal 1 (Week 1): Patient will initiate stair mobility PT Short Term Goal 2 (Week 1): Patient will ambulate >50' with LRAD and MinA PT Short Term Goal 3 (Week 1): Patient will tolerate staying out of the bed between therapy sessions  Skilled Therapeutic Interventions/Progress Updates:    Pt presents in room at scheduled appointment time, RN present, pt reporting chest pain with RN taking vitals, vitals WNL. Pt requires time to receive medications and breathing treatment. Per RN pt cleared to participate with PT, therapist will continue only as pt symptoms allow. Pt missing 20 minutes of 60 min session due to nursing care and med pass at beginning of session. Upon return to room pt reporting decrease in chest pain and agreeable to PT. Session focused on transfer training and therex to promote improved tolerance to activity as well as tolerance to upright. Pt on 2L supplemental O2 throughout session, vitals monitored and SpO2 >95% with rest and activity.  Pt completes supine to sit EOB with supervision, increased time to complete. Pt completes stand pivot transfer with RW min assist, increased time to complete secondary to pt reporting fatigue, requires assist to power up and CGA for stand pivot. Pt transported to day room dependently for time management and energy conservation.  Pt completes seated and standing therex to decrease pain in R knee, improve tolerance to upright, as well as promote BLE strengthening and muscle fiber recruitment needed for functional transfers and mobility including: LAQs x20 alternating BLE Seated marching x20 alternating BLE Heel/toe raise x20 Sit<>stands x5 Standing marches x12 alternating BLE  Pt returned to room  dependently in [facility] for time management, pt encouraged to remains seated in chair to await next session in 30 minutes with education on improving tolerance to upright with pt reluctant but agreeable with education. Pt remains seated in WC with all needs within reach, call light in place, chair alarm donned and activated at end of session.  Therapy Documentation Precautions:  Precautions Precautions: Fall Precaution Comments: Monitor SpO2, L knee pain Restrictions Weight Bearing Restrictions: No   Therapy/Group: Individual Therapy  Edwin Cap PT, DPT 02/12/2023, 1:00 PM

## 2023-02-12 NOTE — Progress Notes (Signed)
Prior to this evening patient having two episodes of black stool. During shift change patient requesting to use the [facility]. Around 1920 assisted patient to the [facility]. Having one episode of urine in the [facility] and 1 in the brief. Urine continues to be amber in color. Stool this evening was black in color and third black stool for the day. No mucous or tarry but smooth. From previous days with patient stool did have a different odor to it this evening. Charge Nurse made aware. Continue to monitor.

## 2023-02-12 NOTE — Progress Notes (Signed)
Occupational Therapy Session Note  Patient Details  Name: Angela Lucero MRN: [id number] Date of Birth: Feb 18, 1941  Today's Date: 02/12/2023 OT Individual Time: 0930-1020 OT Individual Time Calculation (min): 50 min  and Today's Date: 02/12/2023 OT Missed Time: 40 Minutes Missed Time Reason: Patient fatigue   Short Term Goals: Week 1:  OT Short Term Goal 1 (Week 1): Pt will complete LB dressing with CGA OT Short Term Goal 2 (Week 1): Pt wil complete toileting with Min A OT Short Term Goal 3 (Week 1): Pt will complete LB bathing with CGA  Skilled Therapeutic Interventions/Progress Updates:    Session 1: OT session focused on functional endurance, strengthening, and transfers. Pt received sitting in w/c verbalizing fatigue, however agreeable to therapy. Pt on 2L O2 with SpO2 at 95% and HR 89 to start session. Completed folding of towels and wash cloths while seated at table to promote UB strength while holding items against gravity. Pt required rest breaks after ~2 towels. Engaged in peg board with OT positioning vertically to challenge UB strength. Pt placed 17 pegs requiring rest breaks every 3-5 pegs then able to remove all 17 without break. Returned to room and pt informing the need to void. Completed stand pivot transfer w/c>BSC with min A for EC as OT provided total A for hygiene. Pt then returned to bed with max A to manage BLEs. Pt's SpO2 at 99% and HR 97. Coached through breathing techniques with SOB decreasing. Pt left with all needs in reach.  Session 2: Pt received in bed reporting continued max fatigue. Pt asking to complete session tomorrow and rest up for remaining afternoon OT session today. OT updating team with recommendation of 15/7.   Therapy Documentation Precautions:  Precautions Precautions: Fall Precaution Comments: Monitor SpO2, L knee pain Restrictions Weight Bearing Restrictions: No General:   Vital Signs: Therapy Vitals Temp: 97.8 F (36.6 C) Temp Source:  Oral Pulse Rate: 92 Resp: 18 BP: (!) 125/58 Oxygen Therapy SpO2: 97 % Pain:   ADL: ADL Eating: Supervision/safety Where Assessed-Eating: Edge of bed Grooming: Supervision/safety Where Assessed-Grooming: Sitting at sink Upper Body Bathing: Supervision/safety Where Assessed-Upper Body Bathing: Edge of bed Lower Body Bathing: Moderate assistance Where Assessed-Lower Body Bathing: Sitting at sink, Standing at sink Upper Body Dressing: Supervision/safety Where Assessed-Upper Body Dressing: Edge of bed Lower Body Dressing: Moderate assistance Where Assessed-Lower Body Dressing: Standing at sink, Sitting at sink Toileting: Moderate assistance Where Assessed-Toileting: Bedside Commode Toilet Transfer: Distant supervision Tub/Shower Transfer: Not assessed Film/video editor: Not assessed Vision   Perception    Praxis   Balance   Exercises:   Other Treatments:     Therapy/Group: Individual Therapy  Daneil Dan 02/12/2023, 11:26 AM

## 2023-02-13 DIAGNOSIS — K921 Melena: Secondary | ICD-10-CM

## 2023-02-13 DIAGNOSIS — R5381 Other malaise: Secondary | ICD-10-CM | POA: Diagnosis not present

## 2023-02-13 DIAGNOSIS — D649 Anemia, unspecified: Secondary | ICD-10-CM

## 2023-02-13 HISTORY — DX: Anemia, unspecified: D64.9

## 2023-02-13 HISTORY — DX: Melena: K92.1

## 2023-02-13 LAB — HEMOGLOBIN AND HEMATOCRIT, BLOOD
HCT: 35.9 % — ABNORMAL LOW (ref 36.0–46.0)
HCT: 36.2 % (ref 36.0–46.0)
Hemoglobin: 11.7 g/dL — ABNORMAL LOW (ref 12.0–15.0)
Hemoglobin: 11.5 g/dL — ABNORMAL LOW (ref 12.0–15.0)

## 2023-02-13 LAB — GLUCOSE, CAPILLARY
Glucose-Capillary: 141 mg/dL — ABNORMAL HIGH (ref 70–99)
Glucose-Capillary: 148 mg/dL — ABNORMAL HIGH (ref 70–99)
Glucose-Capillary: 114 mg/dL — ABNORMAL HIGH (ref 70–99)
Glucose-Capillary: 200 mg/dL — ABNORMAL HIGH (ref 70–99)

## 2023-02-13 LAB — OCCULT BLOOD X 1 CARD TO LAB, STOOL: Fecal Occult Bld: POSITIVE — AB

## 2023-02-13 MED ORDER — ENOXAPARIN SODIUM 30 MG/0.3ML IJ SOSY
30.0000 mg | PREFILLED_SYRINGE | INTRAMUSCULAR | Status: DC
  Administered 2023-02-13 – 2023-02-24 (×12): 30 mg via SUBCUTANEOUS
  Filled 2023-02-13 (×12): qty 0.3

## 2023-02-13 MED ORDER — IPRATROPIUM-ALBUTEROL 0.5-2.5 (3) MG/3ML IN SOLN
3.0000 mL | Freq: Two times a day (BID) | RESPIRATORY_TRACT | Status: DC
  Administered 2023-02-13 – 2023-02-15 (×3): 3 mL via RESPIRATORY_TRACT
  Filled 2023-02-13 (×5): qty 3

## 2023-02-13 MED ORDER — ENOXAPARIN SODIUM 30 MG/0.3ML IJ SOSY: 30.0000 mg | PREFILLED_SYRINGE | INTRAMUSCULAR | Status: DC

## 2023-02-13 NOTE — Progress Notes (Signed)
Given PRN pain medication. Reported pain rating of "9". Episode of incontinence with urine and stool. Stool black and brown in color. Per patient pain is generalized radiating throughout her body, but does endorse increase hypersensitivity and pain with touch to her feet.

## 2023-02-13 NOTE — Progress Notes (Signed)
PROGRESS NOTE   Subjective/Complaints:  Pt doing well today, Slept well. Denies pain. States she finally had a few BMs yesterday and feels much better. Nursing staff reporting that she had some darker/black stools-- pt denies hx of this. Thought it was because of the antacids and potassium she's on here. Denies any abd pain or n/v with it, denies feeling lightheaded or anything.  Urinating ok. Denies any other complaints or concerns today.   ROS:  + abdominal cramping/constipation - resolved + generalized weakness - ongoing Denies fevers, chills, N/V, abdominal pain, diarrhea, SOB, cough, chest pain, new weakness or paraesthesias.   As per HPI  Objective:   No results found. Recent Labs    02/11/23 0417 02/13/23 1048  WBC 11.9*  --   HGB 12.5 11.7*  HCT 37.7 35.9*  PLT 284  --    Recent Labs    02/11/23 0417 02/12/23 0531  NA 133* 132*  K 3.4* 4.1  CL 91* 90*  CO2 30 30  GLUCOSE 137* 124*  BUN 78* 66*  CREATININE 1.55* 1.38*  CALCIUM 9.1 8.9    Intake/Output Summary (Last 24 hours) at 02/13/2023 1222 Last data filed at 02/13/2023 0700 Gross per 24 hour  Intake 898 ml  Output 50 ml  Net 848 ml        Physical Exam: Vital Signs Blood pressure 125/64, pulse 91, temperature 98.7 F (37.1 C), temperature source Oral, resp. rate 16, height 5\' 6"  (1.676 m), weight 79.4 kg, SpO2 98 %. Physical Exam Constitutional: No apparent distress. Appropriate appearance for age. +Obese. Laying in bed, comfortable. HENT: No JVD. Neck Supple. Trachea midline. Atraumatic, normocephalic. Eyes: PERRLA. EOMI. Visual fields grossly intact.  Cardiovascular: RRR, no murmurs/rub/gallops. No Edema. Peripheral pulses 2+  Respiratory: Faintly decreased bilaterally, no wheeze/rales appreciated today.   + 2L  Abdomen: + Large hernia/diastasis, normoactive BS throughout. Not really any focal tenderness, no rebound. Slightly protuberant  but not distended.   PRIOR EXAMS:  Skin: C/D/I. No apparent lesions. Chronic skin changes on B/L dorsal feet.  MSK:      +B/l collapsed arches +TTP L medial knee, no obvious effusion but mildly larged than R - improved TTP      Strength:                RUE: 5/5 SA, 5/5 EF, 5/5 EE, 5/5 WE, 5/5 FF, 5/5 FA                 LUE: 5/5 SA, 5/5 EF, 5/5 EE, 5/5 WE, 5/5 FF, 5/5 FA                 RLE: 5/5 HF, 5/5 KE, 5/5 DF, 5/5 EHL, 5/5 PF                 LLE:  5/5 HF, 5/5 KE, 5/5 DF, 5/5 EHL, 5/5 PF    Neurologic exam:  Cognition: AAO to person, place, time and event.  Language: Fluent, No substitutions or neoglisms. No dysarthria.  Memory: Recalls 3/3 objects at 5 minutes. No apparent deficits  Insight: Good  insight into current condition.  Mood: Pleasant affect, appropriate mood.  Sensation: To  light touch intact in BL UEs and LEs  Reflexes: 2+ in BL UE and LEs. Negative Hoffman's and babinski signs bilaterally.  CN: 2-12 grossly intact.  Coordination: No apparent tremors. No ataxia on FTN, HTS bilaterally.  Spasticity: MAS 0 in all extremities.    Assessment/Plan: 1. Functional deficits which require 3+ hours per day of interdisciplinary therapy in a comprehensive inpatient rehab setting. Physiatrist is providing close team supervision and 24 hour management of active medical problems listed below. Physiatrist and rehab team continue to assess barriers to discharge/monitor patient progress toward functional and medical goals  Care Tool:  Bathing  Bathing activity did not occur: Refused           Bathing assist       Upper Body Dressing/Undressing Upper body dressing   What is the patient wearing?: Dress    Upper body assist Assist Level: Supervision/Verbal cueing    Lower Body Dressing/Undressing Lower body dressing      What is the patient wearing?: Underwear/pull up     Lower body assist Assist for lower body dressing: Moderate Assistance - Patient 50 - 74%      Toileting Toileting    Toileting assist Assist for toileting: Moderate Assistance - Patient 50 - 74%     Transfers Chair/bed transfer  Transfers assist     Chair/bed transfer assist level: Minimal Assistance - Patient > 75%     Locomotion Ambulation   Ambulation assist      Assist level: Minimal Assistance - Patient > 75% Assistive device: Walker-rolling Max distance: 10'   Walk 10 feet activity   Assist     Assist level: Minimal Assistance - Patient > 75% Assistive device: Walker-rolling   Walk 50 feet activity   Assist Walk 50 feet with 2 turns activity did not occur: Safety/medical concerns (Unable to ambulate >10' or perform stair mobility secondary to global deconditioning)         Walk 150 feet activity   Assist Walk 150 feet activity did not occur: Safety/medical concerns         Walk 10 feet on uneven surface  activity   Assist Walk 10 feet on uneven surfaces activity did not occur: Safety/medical concerns         Wheelchair     Assist Is the patient using a wheelchair?: Yes Type of Wheelchair: Manual Wheelchair activity did not occur:  (Therapist dependently propelling manual wheelchair secondary to fatigue)  Wheelchair assist level: Dependent - Patient 0% Max wheelchair distance: 150    Wheelchair 50 feet with 2 turns activity    Assist        Assist Level: Dependent - Patient 0%   Wheelchair 150 feet activity     Assist      Assist Level: Dependent - Patient 0%   Blood pressure 125/64, pulse 91, temperature 98.7 F (37.1 C), temperature source Oral, resp. rate 16, height 5\' 6"  (1.676 m), weight 79.4 kg, SpO2 98 %.  Medical Problem List and Plan: 1. Functional deficits secondary to debility/CHF/acute hypoxic respiratory failure             -patient may shower             -ELOS/Goals: 10-14 days, SPV goals PT/OT - 6/14: D/w family, SW plan for discharge to hospice nursing after IPR; no referrals sent  while in acute. Family working with Palliative for OP referral; will let us know if they need anything.    2.  Antithrombotics: -DVT/anticoagulation:  Pharmaceutical: Lovenox 40mg  QD             -antiplatelet therapy: Aspirin 81 mg daily 3. Pain Management: Tramadol as needed 4. Mood/Behavior/Sleep: Melatonin 5 mg nightly--sleeping well, cont regimen             -antipsychotic agents: N/A 5. Neuropsych/cognition: This patient is capable of making decisions on her own behalf. 6. Skin/Wound Care: Routine skin checks 7. Fluids/Electrolytes/Nutrition: Routine in and outs with follow-up chemistries - 6/14: Na low-stable 133 this AM; K 3.2 add K dur 20 meq BID x3 days; BUN/Cr stable 1.2-1.5 -02/12/23 Na stable at 132, K 4.1, BUN/Cr improving, will decrease Kdur to QD x2 more days for now and recheck on routine labs on Monday   8.  COPD exacerbation/tobacco use.  Continue nebulizers as advised.  Check oxygen saturations every shift. Tobacco cessation education; does not want patches.    9.  Hypertension.  Lasix 40 mg twice daily, Imdur 60 mg daily, Cozaar 12.5 mg daily, Hydralazine 10 mg every 8 hours, Toprol-XL 25 mg daily.  Monitor with increased mobility - 6/14: Mildly low today; monitor with diuresis, if symptomatic may need cards to assist in adjusting -6/15-16/24 BPs uptrending, monitor for now   Vitals:   02/11/23 2229 02/11/23 2304 02/12/23 0445 02/12/23 0627  BP: (!) 102/46 (!) 99/51 (!) 107/53 (!) 104/54   02/12/23 0805 02/12/23 1952 02/12/23 1953 02/12/23 2012  BP: (!) 125/58 (!) 93/37 (!) 110/54 (!) 113/56   02/12/23 2204 02/12/23 2358 02/13/23 0330 02/13/23 0603  BP: (!) 105/44 (!) 109/52 121/63 125/64     10.  Diastolic congestive heart failure.  Ejection fraction 20 to 25%.  Continue diuresis.  Jardiance 25 mg daily.  Follow-up heart failure team. Daily weights.    - 6/14: Likely decreased d/t bed vs standing, monitor trend  -6/15-16/24 wt stable , cont monitoring Filed Weights    02/11/23 0404 02/12/23 0445 02/13/23 0700  Weight: (S) 78.4 kg 78.8 kg 79.4 kg      11.  AKI on CKD/hyponatremia.  Follow-up chemistries.  Entresto on hold until renal function improved. - Cr stable 1.5; has been 1.2-1.5; monitor BMP in AM d/t low BP and significant diuresis -02/12/23 Cr stable 1.38; monitor routine labs  12.  Hyperlipidemia.  Lipitor 80mg  QD   13.  Type II diabetes mellitus.  Hemoglobin A1c 6.0.   - 6/14: BG stable, continue SSI and Jardiance 25mg  every day  -6/15-16/24 CBGs stable, monitor Recent Labs    02/12/23 2106 02/13/23 0654 02/13/23 1132  GLUCAP 150* 141* 148*      14.  Constipation. Failed enema today. Passing gas. Check KUB - mild gaseous distention, small volume stool.  Adjust bowel program - Sennakot S 2 tabs BID, PRN sorbitol.   - 6/14: Small BM and smear yesterday. 1x sorbitol. Add Miralax daily to regimen. Add PRN suppository -02/12/23 LBM this morning, monitor' -02/13/23 multiple BMs overnight, reportedly black/dark colored; pt asymptomatic. H&H showing slight decrease in Hgb 11.7 (previously 12.3-12.8); will recheck H&H at 6hr mark, get fecal occult card done, and monitor closely; if continuing to have melanotic stools or worsening anemia, may need to consult GI/start protonix/stop lovenox; will monitor.   I spent >26mins performing patient care related activities, including face to face time, documentation time, ordering labs, discussion of labs with nursing staff, and overall coordination of care.   LOS: 3 days A FACE TO FACE EVALUATION WAS PERFORMED  [address] Germaine Ripp 02/13/2023, 12:22 PM

## 2023-02-13 NOTE — IPOC Note (Signed)
Overall Plan of Care [facility]) Patient Details Name: Angela Lucero MRN: [id number] DOB: 08-Dec-1940  Admitting Diagnosis: Debility  Hospital Problems: Principal Problem:   Debility Active Problems:   Melanotic stools   Anemia     Functional Problem List: Nursing Safety, Bladder, Bowel, Edema, Endurance, Medication Management, Pain  PT Balance, Edema, Endurance, Motor  OT Balance, Safety, Edema, Endurance, Motor, Pain  SLP    TR         Basic ADL's: OT Bathing, Dressing, Toileting     Advanced  ADL's: OT       Transfers: PT Bed Mobility, Bed to Chair, Car, Lobbyist, Technical brewer: PT Ambulation, Psychologist, prison and probation services, Stairs     Additional Impairments: OT None  SLP        TR      Anticipated Outcomes Item Anticipated Outcome  Self Feeding no goal  Swallowing      Basic self-care  mod I  Toileting  mod I   Bathroom Transfers mod I  Bowel/Bladder  continence of bowel and bladder  Transfers  ModI with LRAD  Locomotion  ModI/Supv with LRAD  Communication     Cognition     Pain  less than 4  Safety/Judgment  no falls   Therapy Plan: PT Intensity: Minimum of 1-2 x/day ,45 to 90 minutes PT Frequency: 5 out of 7 days PT Duration Estimated Length of Stay: 10-14 OT Intensity: Minimum of 1-2 x/day, 45 to 90 minutes OT Frequency: 5 out of 7 days OT Duration/Estimated Length of Stay: 10- 12 days     Team Interventions: Nursing Interventions Patient/Family Education, Medication Management, Psychosocial Support, Bladder Management, Bowel Management, Disease Management/Prevention, Pain Management, Discharge Planning  PT interventions Ambulation/gait training, Community reintegration, DME/adaptive equipment instruction, Psychosocial support, Stair training, UE/LE Strength taining/ROM, Wheelchair propulsion/positioning, Warden/ranger, Discharge planning, Functional electrical stimulation, Pain management, Skin care/wound  management, Therapeutic Activities, UE/LE Coordination activities, Disease management/prevention, Functional mobility training, Patient/family education, Therapeutic Exercise, Visual/perceptual remediation/compensation  OT Interventions Balance/vestibular training, Discharge planning, Pain management, Self Care/advanced ADL retraining, Therapeutic Activities, Disease mangement/prevention, Functional mobility training, Patient/family education, Skin care/wound managment, Therapeutic Exercise, Community reintegration, Fish farm manager, Psychosocial support, UE/LE Strength taining/ROM  SLP Interventions    TR Interventions    SW/CM Interventions Discharge Planning, Psychosocial Support, Patient/Family Education   Barriers to Discharge MD  Medical stability  Nursing Home environment access/layout, Incontinence, Lack of/limited family support, Insurance for SNF coverage, Weight, Medication compliance, New oxygen home alone with family and nhbrs to a 1 level 3 ste rails r/l  PT Decreased caregiver support, Home environment access/layout, Weight, Lack of/limited family support    OT Decreased caregiver support    SLP      SW Inaccessible home environment, Decreased caregiver support, Lack of/limited family support, Insurance for SNF coverage, New oxygen, Other (comments) (likely to discharge to home with hospice)     Team Discharge Planning: Destination: PT-Home ,OT- Home , SLP-  Projected Follow-up: PT-Outpatient PT (Pending transportation), OT-  Home health OT, SLP-  Projected Equipment Needs: PT-To be determined, OT- To be determined, SLP-  Equipment Details: PT-TBD, OT-  Patient/family involved in discharge planning: PT- Patient,  OT-Patient, SLP-   MD ELOS: 10-14 Medical Rehab Prognosis:  Good Assessment: The patient has been admitted for CIR therapies with the diagnosis of debility/CHF/acute hypoxic respiratory failure . The team will be addressing functional mobility,  strength, stamina, balance, safety, adaptive techniques and equipment, self-care, bowel and bladder mgt,  patient and caregiver education. Goals have been set at [facility] PT/OT. Anticipated discharge destination is home with hospice nursing.        See Team Conference Notes for weekly updates to the plan of care

## 2023-02-13 NOTE — Progress Notes (Signed)
Nursing notified HGB a little lower  11.5  discussed with pharmacy will decrease lovenox to 30 mg. Consider GI consult tomorrow.

## 2023-02-14 ENCOUNTER — Encounter (HOSPITAL_COMMUNITY): Payer: Self-pay | Admitting: Physical Medicine and Rehabilitation

## 2023-02-14 ENCOUNTER — Inpatient Hospital Stay (HOSPITAL_COMMUNITY): Payer: Medicare Other

## 2023-02-14 DIAGNOSIS — I5020 Unspecified systolic (congestive) heart failure: Secondary | ICD-10-CM

## 2023-02-14 DIAGNOSIS — R079 Chest pain, unspecified: Secondary | ICD-10-CM

## 2023-02-14 DIAGNOSIS — N1832 Chronic kidney disease, stage 3b: Secondary | ICD-10-CM

## 2023-02-14 DIAGNOSIS — R5381 Other malaise: Secondary | ICD-10-CM | POA: Diagnosis not present

## 2023-02-14 LAB — CBC
HCT: 34.9 % — ABNORMAL LOW (ref 36.0–46.0)
HCT: 36.8 % (ref 36.0–46.0)
Hemoglobin: 11.4 g/dL — ABNORMAL LOW (ref 12.0–15.0)
Hemoglobin: 11.8 g/dL — ABNORMAL LOW (ref 12.0–15.0)
MCH: 29.7 pg (ref 26.0–34.0)
MCH: 29.5 pg (ref 26.0–34.0)
MCHC: 32.7 g/dL (ref 30.0–36.0)
MCHC: 32.1 g/dL (ref 30.0–36.0)
MCV: 90.9 fL (ref 80.0–100.0)
MCV: 92 fL (ref 80.0–100.0)
Platelets: 249 10*3/uL (ref 150–400)
Platelets: 242 10*3/uL (ref 150–400)
RBC: 3.84 MIL/uL — ABNORMAL LOW (ref 3.87–5.11)
RBC: 4 MIL/uL (ref 3.87–5.11)
RDW: 14.8 % (ref 11.5–15.5)
RDW: 14.8 % (ref 11.5–15.5)
WBC: 8.4 10*3/uL (ref 4.0–10.5)
WBC: 8.4 10*3/uL (ref 4.0–10.5)
nRBC: 0 % (ref 0.0–0.2)
nRBC: 0 % (ref 0.0–0.2)

## 2023-02-14 LAB — BASIC METABOLIC PANEL
Anion gap: 18 — ABNORMAL HIGH (ref 5–15)
Anion gap: 12 (ref 5–15)
BUN: 71 mg/dL — ABNORMAL HIGH (ref 8–23)
BUN: 72 mg/dL — ABNORMAL HIGH (ref 8–23)
CO2: 24 mmol/L (ref 22–32)
CO2: 24 mmol/L (ref 22–32)
Calcium: 9.1 mg/dL (ref 8.9–10.3)
Calcium: 8.9 mg/dL (ref 8.9–10.3)
Chloride: 93 mmol/L — ABNORMAL LOW (ref 98–111)
Chloride: 95 mmol/L — ABNORMAL LOW (ref 98–111)
Creatinine, Ser: 1.58 mg/dL — ABNORMAL HIGH (ref 0.44–1.00)
Creatinine, Ser: 1.55 mg/dL — ABNORMAL HIGH (ref 0.44–1.00)
GFR, Estimated: 33 mL/min — ABNORMAL LOW (ref >60)
GFR, Estimated: 34 mL/min — ABNORMAL LOW (ref >60)
Glucose, Bld: 142 mg/dL — ABNORMAL HIGH (ref 70–99)
Glucose, Bld: 151 mg/dL — ABNORMAL HIGH (ref 70–99)
Potassium: 4.3 mmol/L (ref 3.5–5.1)
Potassium: 4.2 mmol/L (ref 3.5–5.1)
Sodium: 135 mmol/L (ref 135–145)
Sodium: 131 mmol/L — ABNORMAL LOW (ref 135–145)

## 2023-02-14 LAB — TROPONIN I (HIGH SENSITIVITY)
Troponin I (High Sensitivity): 52 ng/L — ABNORMAL HIGH (ref <18)
Troponin I (High Sensitivity): 56 ng/L — ABNORMAL HIGH (ref <18)

## 2023-02-14 LAB — GLUCOSE, CAPILLARY
Glucose-Capillary: 149 mg/dL — ABNORMAL HIGH (ref 70–99)
Glucose-Capillary: 138 mg/dL — ABNORMAL HIGH (ref 70–99)
Glucose-Capillary: 122 mg/dL — ABNORMAL HIGH (ref 70–99)
Glucose-Capillary: 151 mg/dL — ABNORMAL HIGH (ref 70–99)

## 2023-02-14 MED ORDER — FUROSEMIDE 20 MG PO TABS
20.0000 mg | ORAL_TABLET | Freq: Once | ORAL | Status: AC
  Administered 2023-02-14: 20 mg via ORAL
  Filled 2023-02-14: qty 1

## 2023-02-14 MED ORDER — ISOSORBIDE MONONITRATE ER 30 MG PO TB24
90.0000 mg | ORAL_TABLET | Freq: Every day | ORAL | Status: DC
  Administered 2023-02-15 – 2023-02-25 (×11): 90 mg via ORAL
  Filled 2023-02-14 (×11): qty 3

## 2023-02-14 MED ORDER — PANTOPRAZOLE SODIUM 40 MG PO TBEC
40.0000 mg | DELAYED_RELEASE_TABLET | Freq: Two times a day (BID) | ORAL | Status: DC
  Administered 2023-02-14 – 2023-02-25 (×23): 40 mg via ORAL
  Filled 2023-02-14 (×23): qty 1

## 2023-02-14 MED ORDER — ISOSORBIDE MONONITRATE ER 30 MG PO TB24
30.0000 mg | ORAL_TABLET | Freq: Once | ORAL | Status: AC
  Administered 2023-02-14: 30 mg via ORAL
  Filled 2023-02-14: qty 1

## 2023-02-14 MED ORDER — PANTOPRAZOLE SODIUM 40 MG PO TBEC: 40.0000 mg | DELAYED_RELEASE_TABLET | Freq: Two times a day (BID) | ORAL | Status: DC

## 2023-02-14 NOTE — Significant Event (Signed)
This nurse in room to assess patient patient breathing very fast and complaining of chest pain. And sweating nitroglycerin 0.4 mg x 2 given with therapeutic effect. Red mews triggered for heart rate and respirations see progress notes r/t the red mews flowsheet. Red mews process in progress. PA and RRT notified. No new orders at this time. Bed in lowest position with call light in reach safety ensured.

## 2023-02-14 NOTE — Progress Notes (Signed)
Occupational Therapy Note  Patient Details  Name: Angela Lucero MRN: [id number] Date of Birth: 07/16/1941  Today's Date: 02/14/2023 OT Missed Time: 75 Minutes Missed Time Reason: Other (comment) (Chest pain, likely do be placed on hold but orders not in yet)  Patient greeted semi-reclined in bed. Pt reporting chest pain and breathing rapidly. Pt declined to participate in therapy at this time. Per discussion with nursing, pt will likely be placed on hold.   Dr. Shearon Stalls did place pt on hold but orders placed after scheduled therapy time. OT to follow up once pt medically appropriate to resume therapy.    [name] 02/14/2023, 10:04 AM

## 2023-02-14 NOTE — Progress Notes (Signed)
Physical Therapy Session Note  Patient Details  Name: Latisa Zurick MRN: [id number] Date of Birth: Jul 09, 1941  Today's Date: 02/14/2023 PT Missed Time: 60 Minutes Missed Time Reason: MD hold (Comment) (Medical Hold secondary to chest pain)  Attempted to see patient for scheduled PT treatment session, however patient is currently on Medical Hold due to chest pain. Therapist will attempt to make up missed time if patient is medically stable and agreeable this afternoon.   Dailyn Kempner 02/14/2023, 10:13 AM

## 2023-02-14 NOTE — Progress Notes (Signed)
Late entry note: Nursing called at 1:30am stating patient had CP that woke her up from sleep. Had received NTG x1 at that time and was going to be getting another dose. Advised to get the cbc/bmp already ordered for the morning, and added troponin and cxr. Ekg was being done. Asked that RR nurse be notified as well, and they stated she was on her way.  Around 2am, Mindy (RR RN) called stating patient was having improvement of symp toms, EKG was unchanged from prior, and labs/cxr were pending.   I did not receive further calls regarding results or any updates on the patient the remainder of the night, but at about 6:30am the nurse sent a secure chat regarding return of symptoms-- however this did not get received by me until 7:15am given the nature of chat communication and notifications. Informed nursing staff to please inform the daytime provider for this  urgent matter.   [employer] Shakesha [employer]

## 2023-02-14 NOTE — Progress Notes (Signed)
Received message from LCSW Lula Olszewski that son had questions regarding hospice/palliative care. Spoke with Angela Lucero and he requested referral for [facility] outpatient palliative services. Referral called in to [facility] [place].

## 2023-02-14 NOTE — Progress Notes (Signed)
Patient awaken with sudden onset of CP and notified nurse upon arriving patient was clinging her chest  states pain 8/10, O2 at 2L and SL NTG administer, HOB elevated, VS obtained and Chest pain protocol initiated. On call [address], [place] notified orders received stat Troponin's,CBC, BMET, EKG.-stat. Rapid Response notified. Patient received a total of SL NTG X3,discomfort of pain slowly decrease after NTG and VS were monitored   04 Second episode with CP,patient was again  awaken from  her sleep pain scale 8/10, HOB elevated SL NTG x1 administered, states pain 6/10.with discomfort radiating to upper left shoulder blade Monitor and EKG repeated.Rapid Response RN made aware, EKG repeated and Labs repeated and pending, closely monitored.Communicated back with Mindy RN, RR, continue to monitor and assess. 0458 EKG done, continue to monitor CP 4/10,

## 2023-02-14 NOTE — Consult Note (Addendum)
Cardiology Consultation   Patient ID: Angela Lucero MRN: [id number]; DOB: [birth year]  Admit date: 02/10/2023 Date of Consult: 02/14/2023  PCP:  [name], MD   [facility] Providers Cardiologist:  Gypsy Balsam, MD        Patient Profile:   Angela Lucero is a 82 y.o. female with a hx of CAD (PTCA & PCI in 1999, 2005) EFrEF, COPD, HLD, DM2, admitted 02/04/23 for acute CHF treated and discharged to rehab 02/10/23 who is being seen 02/14/2023 for the evaluation of CHF and chest pain at the request of Dr. Shearon Stalls.  History of Present Illness:   Angela Lucero with above hx was admitted 02/04/23 with acute hypoxic respiratory failure and acute on chronic HFrEF.   She was diuresed.  Her prior cath in 2023 with CTO mLAD and MRI revealed scarring in LAD no intervention planned.  She has chronic RBBB. On 6/7 her troponin 147>>136 and type II MI/demand ischemia.   Entresto/spironolactone held along with SGLT2 due to AKI. On BB, imdur, hydralazine.  Echo that admit with severe MV regurg.  This is related to her cardiomyopathy.  Thought she would benefit from structural heart eval as an outpt.  Her EF to 20-25%  was decreased from 11/2021 when it was 30-35%.  Mod elevated PA systolic pressure with RV systolic pressure of 49.1 mmHg. LA was severely dilated.  She was seen by palliative care and was made DNR.   She was neg 6.9 L by 6/13 and Cr improved.  Discharged on amlodipine 5, liptior 80, ASA 81, lasix 40 BID,  hydralazine 10 mg every 8 hours.  Imdur 60 and losartan 12.5 mg daily.  Toprol XL 25 and jardiance 25.  Plan was to resume entresto if she tolerated losartan.  Her 02 was weaned off prior to discharge.  D/c wt of 89.1 Kg  Cr was 1.26 down from 1.89.  Pt admitted to CIR 02/10/23   Today pt developed acute SOB and complained of chest pain. The pain woke her from sleep.    She was diaphoretic.  2 SL NTG given and symptoms improved.   HR to 117 and Resp to 32 Rapid response was called.  It was noted she has  chronic 4/10 chest pain.  She had 2 of these episodes during the night and now repeated NTG during night and at lunch. The NTG does resolve pain.  Troponin today 52 and 56  (with acute admit hs troponin  on arrival 147>>136>>135>>131)  Na 131 K+ 4.2 Cl 95  C02 24 BUN 72  Cr 1.55  Hgb 11.8 plts 242 WBC 8.4   Stools for heme +  but Hgb stable.   PCXR today mild vascular congestion without focal infiltrate.   EKG:  The EKG was personally reviewed and demonstrates:  SR in 90s with 1st degree AV block and RBBB, chronic and LAD  no acute changes Telemetry:  Telemetry was personally reviewed and demonstrates:  no tele.   BP 111/60 P 86 R 18 afebrile  sp02 on 2L  96%   Has continued on her discharge medications Currently with bladder scan 450 cc urine   Past Medical History:  Diagnosis Date   Arthritis 2010   Lower back and neck   CAD (coronary artery disease)    COPD (chronic obstructive pulmonary disease) (HCC)    CVA (cerebral vascular accident) (HCC)    1997, 2005 stents   Diabetes (HCC)    Diabetes mellitus, type II (HCC)  Gout 1998   Heart attack [facility])    [id number]   Hyperlipidemia    Hypertension    Hypertriglyceridemia     Past Surgical History:  Procedure Laterality Date   APPENDECTOMY  1971   CHOLECYSTECTOMY     LEFT HEART CATH AND CORONARY ANGIOGRAPHY N/A 12/14/2021   Procedure: LEFT HEART CATH AND CORONARY ANGIOGRAPHY;  Surgeon: Yvonne Kendall, MD;  Location: [facility] INVASIVE CV LAB;  Service: Cardiovascular;  Laterality: N/A;   Navel hernia repair  2007   PARTIAL HYSTERECTOMY  1976   Stent Heart     TUBAL LIGATION  1974   Ulna nerve transpost L arm       Home Medications:  Prior to Admission medications   Medication Sig Start Date End Date Taking? Authorizing Provider  acetaminophen (TYLENOL) 500 MG tablet Take 500 mg by mouth every 6 (six) hours as needed for mild pain.    [provider]  albuterol (VENTOLIN HFA) 108 (90 Base) MCG/ACT inhaler Inhale  2 puffs into the lungs every 6 (six) hours as needed for wheezing or shortness of breath.    [provider]  amLODipine (NORVASC) 5 MG tablet Take 1 tablet (5 mg total) by mouth daily. 02/24/22   Georgeanna Lea, MD  aspirin EC 81 MG tablet Take 81 mg by mouth daily. Swallow whole.    [provider]  atorvastatin (LIPITOR) 80 MG tablet Take 80 mg by mouth daily.    [provider]  Cetirizine-Pseudoephedrine (ALLERGY RELIEF D PO) Take 1 tablet by mouth daily.    [provider]  Cholecalciferol (VITAMIN D3) 250 MCG (10000 UT) TABS Take 10,000 Units by mouth in the morning and at bedtime.    [provider]  empagliflozin (JARDIANCE) 25 MG TABS tablet Take 25 mg by mouth daily.    [provider]  feeding supplement (ENSURE ENLIVE / ENSURE PLUS) LIQD Take 237 mLs by mouth 2 (two) times daily between meals. 02/10/23   Modena Slater, DO  furosemide (LASIX) 40 MG tablet Take 1 tablet (40 mg total) by mouth 2 (two) times daily. 02/10/23   Modena Slater, DO  gabapentin (NEURONTIN) 300 MG capsule Take 300 mg by mouth 2 (two) times daily.    [provider]  hydrALAZINE (APRESOLINE) 10 MG tablet Take 1 tablet (10 mg total) by mouth every 8 (eight) hours. 02/10/23   Modena Slater, DO  ipratropium-albuterol (DUONEB) 0.5-2.5 (3) MG/3ML SOLN Take 3 mLs by nebulization 3 (three) times daily. 02/10/23   Modena Slater, DO  isosorbide mononitrate (IMDUR) 60 MG 24 hr tablet Take 1 tablet (60 mg total) by mouth daily. 02/24/22   Georgeanna Lea, MD  losartan (COZAAR) 25 MG tablet Take 0.5 tablets (12.5 mg total) by mouth daily. 02/11/23   Modena Slater, DO  meloxicam (MOBIC) 7.5 MG tablet TAKE 1 TABLET BY MOUTH EVERY DAY AS NEEDED 01/31/23   [name], MD  methocarbamol (ROBAXIN) 750 MG tablet TAKE 1 TABLET BY MOUTH THREE TIMES A DAY AS NEEDED 10/04/22   [name], MD  metoprolol succinate (TOPROL-XL) 25 MG 24 hr tablet Take 1 tablet (25 mg total) by mouth daily.  12/18/21   Jonita Albee, PA-C  Multiple Vitamin (MULTIVITAMIN ADULT PO) Take 1 tablet by mouth daily.    [provider]  Omega 3 1000 MG CAPS Take 2 capsules by mouth in the morning and at bedtime.    [provider]  polyethylene glycol powder (GLYCOLAX/MIRALAX) 17 GM/SCOOP powder  Take 17 g by mouth daily as needed for mild constipation. 02/10/23   Modena Slater, DO  senna-docusate (SENOKOT-S) 8.6-50 MG tablet Take 2 tablets by mouth 2 (two) times daily. 02/10/23   Modena Slater, DO  traMADol (ULTRAM) 50 MG tablet TAKE 1 TABLET (50 MG TOTAL) BY MOUTH EVERY 6 (SIX) HOURS AS NEEDED FOR MODERATE PAIN 02/07/23   [name], MD  umeclidinium-vilanterol [facility] ELLIPTA) 62.5-25 MCG/ACT AEPB Inhale 1 puff into the lungs daily. 02/11/23   Modena Slater, DO    Inpatient Medications: Scheduled Meds:  aspirin EC  81 mg Oral Daily   atorvastatin  80 mg Oral Daily   empagliflozin  25 mg Oral Daily   enoxaparin (LOVENOX) injection  30 mg Subcutaneous Q24H   feeding supplement  237 mL Oral BID BM   furosemide  40 mg Oral BID   hydrALAZINE  10 mg Oral Q8H   insulin aspart  0-9 Units Subcutaneous TID WC   ipratropium-albuterol  3 mL Nebulization BID   isosorbide mononitrate  60 mg Oral Daily   losartan  12.5 mg Oral Daily   melatonin  5 mg Oral QHS   metoprolol succinate  25 mg Oral Daily   pantoprazole  40 mg Oral BID AC   polyethylene glycol  17 g Oral Daily   senna-docusate  2 tablet Oral BID   umeclidinium-vilanterol  1 puff Inhalation Daily   Continuous Infusions:  PRN Meds: acetaminophen, albuterol, Glycerin (Adult), nitroGLYCERIN, simethicone, sorbitol, traMADol  Allergies:   No Known Allergies  Social History:   Social History   Socioeconomic History   Marital status: Widowed    Spouse name: Not on file   Number of children: Not on file   Years of education: Not on file   Highest education level: Not on file  Occupational History   Not on file  Tobacco Use    Smoking status: Every Day    Packs/day: 1.5    Types: Cigarettes   Smokeless tobacco: Never  Vaping Use   Vaping Use: Never used  Substance and Sexual Activity   Alcohol use: Not Currently   Drug use: Never   Sexual activity: Not Currently  Other Topics Concern   Not on file  Social History Narrative   Not on file   Social Determinants of Health   Financial Resource Strain: Low Risk  (02/10/2023)   Overall Financial Resource Strain (CARDIA)    Difficulty of Paying Living Expenses: Not very hard  Food Insecurity: No Food Insecurity (02/04/2023)   Hunger Vital Sign    Worried About Running Out of Food in the Last Year: Never true    Ran Out of Food in the Last Year: Never true  Transportation Needs: No Transportation Needs (02/10/2023)   PRAPARE - Administrator, Civil Service (Medical): No    Lack of Transportation (Non-Medical): No  Physical Activity: Not on file  Stress: Not on file  Social Connections: Not on file  Intimate Partner Violence: Not on file    Family History:    Family History  Problem Relation Age of Onset   Heart disease Mother    Hypertension Mother    Heart disease Father    Hypertension Father    Cancer Brother      ROS:  Please see the history of present illness.  General:no colds or fevers, no weight changes Skin:no rashes or ulcers HEENT:no blurred vision, no congestion CV:see HPI PUL:see HPI GI:no diarrhea constipation or melena, no  indigestion GU:no hematuria, no dysuria + urinary retention MS:no joint pain, no claudication Neuro:no syncope, no lightheadedness Endo:+ diabetes, no thyroid disease  All other ROS reviewed and negative.     Physical Exam/Data:   Vitals:   02/14/23 0800 02/14/23 0843 02/14/23 0900 02/14/23 1006  BP: 135/78  114/68 111/60  Pulse: (!) 117 96 93 86  Resp: (!) 32 (!) 28 18 18   Temp: 98.2 F (36.8 C)  98.4 F (36.9 C) 98 F (36.7 C)  TempSrc: Oral  Oral Oral  SpO2: 100% 98% 98% 96%  Weight:       Height:        Intake/Output Summary (Last 24 hours) at 02/14/2023 1314 Last data filed at 02/14/2023 1159 Gross per 24 hour  Intake 738 ml  Output 750 ml  Net -12 ml      02/13/2023    7:00 AM 02/12/2023    4:45 AM 02/11/2023    4:04 AM  Last 3 Weights  Weight (lbs) 175 lb 0.7 oz 173 lb 11.6 oz 172 lb 13.5 oz   Weight (kg) 79.4 kg 78.8 kg 78.4 kg      Significant value     Body mass index is 28.25 kg/m.  General:  Well nourished, well developed, in no acute distress currently feels well has her chronic pain but nothing like it has been today HEENT: normal Neck: no JVD Vascular: No carotid bruits; Distal pulses 2+ bilaterally Cardiac:  normal S1, S2; RRR; no murmur gallup rub or click Lungs:  clear to auscultation bilaterally, no wheezing, rhonchi occ rales  Abd: soft, nontender, no hepatomegaly  Ext: no edema Musculoskeletal:  No deformities, BUE and BLE strength normal and equal Skin: warm and dry  Neuro:  alert and oriented X 3 MAE follows commands, no focal abnormalities noted Psych:  Normal affect   Relevant CV Studies:  02/05/23  Echo IMPRESSIONS     1. Left ventricular ejection fraction, by estimation, is 20 to 25%. The  left ventricle has severely decreased function. The left ventricle  demonstrates global hypokinesis. The left ventricular internal cavity size  was mildly dilated. Indeterminate  diastolic filling due to E-A fusion.   2. Right ventricular systolic function is normal. The right ventricular  size is mildly enlarged. There is moderately elevated pulmonary artery  systolic pressure. The estimated right ventricular systolic pressure is  49.1 mmHg.   3. Left atrial size was severely dilated.   4. Right atrial size was mildly dilated.   5. The mitral valve is abnormal. Severe mitral valve regurgitation. No  evidence of mitral stenosis.   6. The aortic valve is tricuspid. Aortic valve regurgitation is not  visualized. Aortic valve  sclerosis/calcification is present, without any  evidence of aortic stenosis.   7. The inferior vena cava is dilated in size with <50% respiratory  variability, suggesting right atrial pressure of 15 mmHg.   Comparison(s): Changes from prior study are noted. The left ventricular  function is significantly worse. Severe MR is now present.   FINDINGS   Left Ventricle: Left ventricular ejection fraction, by estimation, is 20  to 25%. The left ventricle has severely decreased function. The left  ventricle demonstrates global hypokinesis. Definity contrast agent was  given IV to delineate the left  ventricular endocardial borders. The left ventricular internal cavity size  was mildly dilated. There is no left ventricular hypertrophy.  Indeterminate diastolic filling due to E-A fusion.   Right Ventricle: The right ventricular size is mildly  enlarged. No  increase in right ventricular wall thickness. Right ventricular systolic  function is normal. There is moderately elevated pulmonary artery systolic  pressure. The tricuspid regurgitant  velocity is 2.92 m/s, and with an assumed right atrial pressure of 15  mmHg, the estimated right ventricular systolic pressure is 49.1 mmHg.   Left Atrium: Left atrial size was severely dilated.   Right Atrium: Right atrial size was mildly dilated.   Pericardium: There is no evidence of pericardial effusion.   Mitral Valve: The mitral valve is abnormal. Severe mitral valve  regurgitation. No evidence of mitral valve stenosis. MV peak gradient, 8.1  mmHg. The mean mitral valve gradient is 3.0 mmHg.   Tricuspid Valve: The tricuspid valve is grossly normal. Tricuspid valve  regurgitation is mild . No evidence of tricuspid stenosis.   Aortic Valve: The aortic valve is tricuspid. Aortic valve regurgitation is  not visualized. Aortic valve sclerosis/calcification is present, without  any evidence of aortic stenosis. Aortic valve mean gradient measures 2.0   mmHg. Aortic valve peak gradient  measures 3.2 mmHg. Aortic valve area, by VTI measures 3.09 cm.   Pulmonic Valve: The pulmonic valve was grossly normal. Pulmonic valve  regurgitation is trivial. No evidence of pulmonic stenosis.   Aorta: The aortic root is normal in size and structure.   Venous: The inferior vena cava is dilated in size with less than 50%  respiratory variability, suggesting right atrial pressure of 15 mmHg.   IAS/Shunts: The atrial septum is grossly normal.    12/16/21 MR Cardiac   CLINICAL DATA:  28F with CAD, T2DM p/w chest pain. Echo showed EF 30-35%,?RA mass. Cath showed CTO mid LAD, patent RCA stent.   EXAM: CARDIAC MRI   TECHNIQUE: The patient was scanned on a 1.5 Tesla Siemens magnet. A dedicated cardiac coil was used. Functional imaging was done using Fiesta sequences. 2,3, and 4 chamber views were done to assess for RWMA's. Modified Simpson's rule using a short axis stack was used to calculate an ejection fraction on a dedicated work [employer]. The patient received 10 cc of Gadavist. After 10 minutes inversion recovery sequences were used to assess for infiltration and scar tissue.   CONTRAST:  10 cc  of Gadavist   FINDINGS: Left ventricle:   -Severe dilatation   -Severe systolic dysfunction   -Subendocardial LGE consistent with prior infarct in basal anteroseptum, mid anterior/anteroseptum, apical anterior/septum, and apex. LGE >50% transmural suggesting nonviability in this territory   LV EF:  24% (Normal 56-78%)   Absolute volumes:   LV EDV: (Normal 52-141 mL)   LV ESV: (Normal 13-51 mL)   LV SV: 80mL (Normal 33-97 mL)   CO: 6.3L/min (Normal 2.7-6.0 L/min)   Indexed volumes:   LV EDV: 176mL/sq-m (Normal 41-81 mL/sq-m)   LV ESV: 178mL/sq-m (Normal 12-21 mL/sq-m)   LV SV: 61mL/sq-m (Normal 26-56 mL/sq-m)   CI: 3.3L/min/sq-m (Normal 1.8-3.8 L/min/sq-m)   Right ventricle: Normal size and  systolic function   RV EF: 52% (Normal 47-80%)   Absolute volumes:   RV EDV: (Normal 58-154 mL)   RV ESV: 74mL (Normal 12-68 mL)   RV SV: 79mL (Normal 35-98 mL)   CO: 6.2L/min (Normal 2.7-6 L/min)   Indexed volumes:   RV EDV: 30mL/sq-m (Normal 48-87 mL/sq-m)   RV ESV: 12mL/sq-m (Normal 11-28 mL/sq-m)   RV SV: 86mL/sq-m (Normal 27-57 mL/sq-m)   CI: 3.2L/min/sq-m (Normal 1.8-3.8 L/min/sq-m)   Left atrium: Moderate enlargement   Right atrium:  Mild enlargement.  No mass seen   Mitral valve: Moderate regurgitation (regurgitant fraction 26%)   Aortic valve: Tricuspid.  No regurgitation   Tricuspid valve: Mild regurgitation   Pulmonic valve: No regurgitation   Aorta: Normal proximal ascending aorta   Coronary arteries: Normal origins   Pericardium: Normal   Extracardiac structures: Opacity left base, recommend dedicated lung imaging   IMPRESSION: 1.  No right atrial mass seen   2. Subendocardial late gadolinium enhancement consistent with prior infarct in LAD territory (basal anteroseptum, mid anterior/anteroseptal, apical anterior/septal, and apex). LGE is greater than 50% transmural suggesting nonviability in this territory   3.  Severe LV dilatation with severe systolic dysfunction (EF 24%)   4.  Normal RV size and systolic function (EF 52%)   5.  Moderate mitral regurgitation (regurgitant fraction 26%)   6.  Opacity at left lung base, recommend dedicated lung imaging   12/14/21 [facility]   Diagnostic Dominance: Right    12/15/2021 TTE   IMPRESSIONS     1. There is a fibrous structure that moves with tricuspid valve annulus.  This could represent a mass just the fibrous connection between the RA/RV.  Consider TEE for better characterization. Does not appear to be thrombus.  Cannot review images from  yesterday.   2. Left ventricular ejection fraction, by estimation, is 30 to 35%. The  left ventricle has moderately decreased function. The left  ventricle  demonstrates regional wall motion abnormalities (see scoring  diagram/findings for description). The left  ventricular internal cavity size was mildly dilated.   3. Right ventricular systolic function is normal. The right ventricular  size is normal.   4. The mitral valve is grossly normal. Mild to moderate mitral valve  regurgitation. No evidence of mitral stenosis.   5. The inferior vena cava is normal in size with greater than 50%  respiratory variability, suggesting right atrial pressure of 3 mmHg.   Conclusion(s)/Recommendation(s): Findings consistent with ischemic  cardiomyopathy. No left ventricular mural or apical thrombus/thrombi.   FINDINGS   Left Ventricle: Left ventricular ejection fraction, by estimation, is 30  to 35%. The left ventricle has moderately decreased function. The left  ventricle demonstrates regional wall motion abnormalities. Definity  contrast agent was given IV to delineate  the left ventricular endocardial borders. The left ventricular internal  cavity size was mildly dilated. There is no left ventricular hypertrophy.     LV Wall Scoring:  The mid and distal anterior septum, inferior septum, entire inferior wall,  apical anterior segment, and apex are hypokinetic.   Right Ventricle: The right ventricular size is normal. No increase in  right ventricular wall thickness. Right ventricular systolic function is  normal.   Left Atrium: Left atrial size was normal in size.   Right Atrium: There is a fibrous structure that moves with tricuspid valve  annulus. This could represent a mass just the fibrous connection between  the RA/RV. Consider TEE for better characterization. Does not appear to be  thrombus. Cannot review images  from yesterday. Right atrial size was normal in size.   Mitral Valve: The mitral valve is grossly normal. Mild to moderate mitral  valve regurgitation. No evidence of mitral valve stenosis.   Tricuspid Valve: The  tricuspid valve is grossly normal. Tricuspid valve  regurgitation is trivial. No evidence of tricuspid stenosis.   Aorta: The aortic root and ascending aorta are structurally normal, with  no evidence of dilitation.   Venous: The inferior vena cava is normal in size  with greater than 50%  respiratory variability, suggesting right atrial pressure of 3 mmHg.   IAS/Shunts: The atrial septum is grossly normal.     Laboratory Data:  High Sensitivity Troponin:   Recent Labs  Lab 02/04/23 1514 02/07/23 1622 02/07/23 1823 02/14/23 0207 02/14/23 0454  TROPONINIHS 136* 135* 131* 52* 56*     Chemistry Recent Labs  Lab 02/12/23 0531 02/14/23 0207 02/14/23 0454  NA 132* 135 131*  K 4.1 4.3 4.2  CL 90* 93* 95*  CO2 30 24 24   GLUCOSE 124* 142* 151*  BUN 66* 71* 72*  CREATININE 1.38* 1.58* 1.55*  CALCIUM 8.9 9.1 8.9  GFRNONAA 39* 33* 34*  ANIONGAP 12 18* 12    Recent Labs  Lab 02/11/23 0417  PROT 6.0*  ALBUMIN 3.0*  AST 34  ALT 32  ALKPHOS 33*  BILITOT 0.7   Lipids No results for input(s): "CHOL", "TRIG", "HDL", "LABVLDL", "LDLCALC", "CHOLHDL" in the last 168 hours.  Hematology Recent Labs  Lab 02/11/23 0417 02/13/23 1048 02/13/23 1619 02/14/23 0207 02/14/23 0454  WBC 11.9*  --   --  8.4 8.4  RBC 4.15  --   --  3.84* 4.00  HGB 12.5   < > 11.5* 11.4* 11.8*  HCT 37.7   < > 36.2 34.9* 36.8  MCV 90.8  --   --  90.9 92.0  MCH 30.1  --   --  29.7 29.5  MCHC 33.2  --   --  32.7 32.1  RDW 14.7  --   --  14.8 14.8  PLT 284  --   --  249 242   < > = values in this interval not displayed.   Thyroid No results for input(s): "TSH", "FREET4" in the last 168 hours.  BNPNo results for input(s): "BNP", "PROBNP" in the last 168 hours.  DDimer No results for input(s): "DDIMER" in the last 168 hours.   Radiology/Studies:  DG Chest Port 1 View  Result Date: 02/14/2023 CLINICAL DATA:  Chest pain EXAM: PORTABLE CHEST 1 VIEW COMPARISON:  02/04/2023 FINDINGS: Cardiac shadow is  enlarged. Aortic calcifications are again seen. Lungs demonstrate no focal infiltrate. Mild vascular congestion is seen. No bony abnormality is noted. IMPRESSION: Mild vascular congestion without focal infiltrate. Electronically Signed   By: Alcide Clever M.D.   On: 02/14/2023 02:00   DG Abd 1 View  Result Date: 02/10/2023 CLINICAL DATA:  Nausea. EXAM: ABDOMEN - 1 VIEW COMPARISON:  CT 09/30/2015 FINDINGS: Divided portable supine views of the abdomen obtained. Mild gaseous gastric distension. No small bowel dilatation or obstruction. Small volume of formed colonic stool. Cholecystectomy clips in the right upper quadrant. No evidence of radiopaque calculi or soft tissue calcifications. The low pelvis is excluded from the field of view. Scoliosis in the spine. IMPRESSION: Mild gaseous gastric distension, nonspecific, can be seen with gastritis. Otherwise normal bowel gas pattern. Electronically Signed   By: Narda Rutherford M.D.   On: 02/10/2023 15:46     Assessment and Plan:   Acute chest pain several times over last 24 hours with Troponin on 50s flat and less than on admit.  NTG responsive.   She had cath  11/2021 and cMRI 2023 with CTO LAD and  non viability in  prior infarct in basal anteroseptum, mid anterior/anteroseptum, apical anterior/septum, and apex. She had patent stent to dRCAQ and 40-60% otherwise and same amount of disease in LCX. May be demand ischemia with CHF though wt has continued to decrease.  EKG  without acute changes.  Not on tele in rehab  - could consider other CAD beside LAD on imdur 60 - could add ranexa 500 BID or increase imdur to 90 mg daily, will give another 30 mg po now then 90 daily CM/HFrEF -- most recent echo with EF 20-25%.  She was diuresed and stable at discharge.  On lasix 40 BID if I&O correct she is + 2,135 but with wt less than discharge if correct.   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 Monitor    Risk  Assessment/Risk Scores:     TIMI Risk Score for Unstable Angina or Non-ST Elevation MI:   The patient's TIMI risk score is 6, which indicates a 41% risk of all cause mortality, new or recurrent myocardial infarction or need for urgent revascularization in the next 14 days.  New York Heart Association (NYHA) Functional Class NYHA Class III        For questions or updates, please contact [facility] Please consult [email or web address] for contact info under    Signed, Nada Boozer, NP  02/14/2023 1:14 PM  Personally seen and examined. Agree with above.  82 year old currently in rehab secondary to debility with recent chest pain episode responsive to nitroglycerin.  She has known coronary artery disease, cardiac catheterization diagram as above with occluded vessels, collateral flow, treated medically.  Ejection fraction has been severely reduced in the 20 to 25% range.  Recent EKG personally reviewed shows no ischemic changes however she does have a mildly prolonged PR interval and right bundle branch block at baseline.  Prior troponins were mildly elevated and flat from 1 40-1 30, most current troponins in the 50-60 range.  Appreciate previous consultation from advanced heart failure team/structural heart team.  Palliative care team as well.  Currently DNR.  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  In the future could add Ranexa 500 mg twice a day. -Continue with current goal-directed medical therapy which encompasses low-dose ARB losartan 12.5 mg as well as low-dose metoprolol succinate 25 mg a day as well as Jardiance 25 mg a day.  She is on furosemide 40 mg twice a day we are being careful with her first-degree AV block.  She understands that she is at increased risk from a cardiovascular  perspective.  She would love to be able to see her great grandchildren who live in [place] state.  She is realistic.  Continue to work with rehab team to help improve overall energy/stability.  Donato Schultz, MD   We will follow with you.

## 2023-02-14 NOTE — Progress Notes (Signed)
Patient resting, will attempt to see again later.

## 2023-02-14 NOTE — Progress Notes (Signed)
Physical Therapy Session Note  Patient Details  Name: Angela Lucero MRN: [id number] Date of Birth: [birth year]  Today's Date: 02/14/2023 PT Missed Time: 75 Minutes Missed Time Reason: MD hold (Comment) (On Medical Hold)  Attempted to make up missed time from this morning, however patient remains on medical hold due to chest pain. Patient has missed a total of 135 minutes of physical therapy this date.   Yusef Lamp 02/14/2023, 3:05 PM

## 2023-02-14 NOTE — Significant Event (Signed)
Rapid Response Event Note   Reason for Call :  CP-8/10  Per RN, pt began c/o CP 8/10 of which awakened her from sleep. EKG was done and pt was given 3 SL ntg.  Initial Focused Assessment:  Pt lying in bed with eyes open. Her breathing is mildly labored. She is alert and oriented. She c/o mild SOB and 4/10 mid sternal chest pressure(both of which she says is baseline for her). Lungs are clear, decreased in the bases. Skin warm and dry.  T-97.7, HR-91, BP-117/66, RR-22, SpO2-92% on 2L .    Interventions:  EKG-NSR, L axis deviation, R BBB, L ventricular hypertrophy with repolarization abn. ANT/septal infarct, age undetermined.  CBC/CMP/Trop PCXR NTG SL 0.4mg  x 3 Plan of Care:  Pt says she always has pressure in her chest 4/10, she also says she is always SOB. Await lab/PCXR results. Patient instructed to press call bell if SOB/CP worsens. Please call RRT if further assistance needed.   Event Summary:   MD Notified: France Ravens, PA notified PTA RRT Call [phone] Arrival [phone] End Time:0205  Terrilyn Saver, RN

## 2023-02-14 NOTE — Progress Notes (Signed)
   02/14/23 0800  Assess: MEWS Score  Temp 98.2 F (36.8 C)  BP 135/78  Pulse Rate (!) 117  Resp (!) 32  Level of Consciousness Alert  SpO2 100 %  O2 Device Nasal Cannula  O2 Flow Rate (L/min) 2 L/min  Assess: MEWS Score  MEWS Temp 0  MEWS Systolic 0  MEWS Pulse 2  MEWS RR 2  MEWS LOC 0  MEWS Score 4  MEWS Score Color Red  Assess: if the MEWS score is Yellow or Red  Were vital signs taken at a resting state? Yes  Focused Assessment Change from prior assessment (see assessment flowsheet)  Does the patient meet 2 or more of the SIRS criteria? Yes  Does the patient have a confirmed or suspected source of infection? No  MEWS guidelines implemented  Yes, red  Treat  MEWS Interventions Considered administering scheduled or prn medications/treatments as ordered  Take Vital Signs  Increase Vital Sign Frequency  Red: Q1hr x2, continue Q4hrs until patient remains green for 12hrs  Escalate  MEWS: Escalate Red: Discuss with charge nurse and notify provider. Consider notifying RRT. If remains red for 2 hours consider need for higher level of care  Notify: Charge Nurse/RN  Name of Charge Nurse/RN Notified [place], RN  Provider Notification  Provider Name/Title Dinah Beers  Date Provider Notified 02/14/23  Time Provider Notified [phone]  Method of Notification Call  Notification Reason Change in status  Provider response At bedside  Date of Provider Response 02/14/23  Time of Provider Response [phone]  Notify: Rapid Response  Name of Rapid Response RN Notified shelby RN  Date Rapid Response Notified 02/14/23  Time Rapid Response Notified 0806  Assess: SIRS CRITERIA  SIRS Temperature  0  SIRS Pulse 1  SIRS Respirations  1  SIRS WBC 0  SIRS Score Sum  2

## 2023-02-14 NOTE — Progress Notes (Signed)
Patient ID: Angela Lucero, female   DOB: 01/06/1941, 82 y.o.   MRN: [id number]  SW spoke  with pt son Trey Paula to provide updates on ELOS and SW will follow-up after team conference.   SW left message for palliative medicine team [phone] to discuss discharge needs. *SW received return call from [place]. She suggested a re-consult if needed for services. SW asked her to follow-up with pt son. SW provided contact information.    Cecile Sheerer, MSW, LCSWA Office: [phone] Cell: [phone] Fax: [phone]

## 2023-02-14 NOTE — Care Management (Signed)
[facility] Individual Statement of Services  Patient Name:  Angela Lucero  Date:  02/14/2023  Welcome to the [facility].  Our goal is to provide you with an individualized program based on your diagnosis and situation, designed to meet your specific needs.  With this comprehensive rehabilitation program, you will be expected to participate in at least 3 hours of rehabilitation therapies Monday-Friday, with modified therapy programming on the weekends.  Your rehabilitation program will include the following services:  Physical Therapy (PT), Occupational Therapy (OT), 24 hour per day rehabilitation nursing, Therapeutic Recreaction (TR), Psychology, Neuropsychology, Care Coordinator, Rehabilitation Medicine, Nutrition Services, Pharmacy Services, and Other  Weekly team conferences will be held on Tuesadys to discuss your progress.  Your Inpatient Rehabilitation Care Coordinator will talk with you frequently to get your input and to update you on team discussions.  Team conferences with you and your family in attendance may also be held.  Expected length of stay: 10-14 days  Overall anticipated outcome: Independent with Assistive Device  Depending on your progress and recovery, your program may change. Your Inpatient Rehabilitation Care Coordinator will coordinate services and will keep you informed of any changes. Your Inpatient Rehabilitation Care Coordinator's name and contact numbers are listed  below.  The following services may also be recommended but are not provided by the [facility]:  Driving Evaluations Home Health Rehabiltiation Services Outpatient Rehabilitation Services Vocational Rehabilitation   Arrangements will be made to provide these services after discharge if needed.  Arrangements include referral to agencies that provide these services.  Your insurance has been verified to be:  Medicare A/B  Your primary doctor  is:  [name]  Pertinent information will be shared with your doctor and your insurance company.  Inpatient Rehabilitation Care Coordinator:  Susie Cassette [phone] or [phone]  Information discussed with and copy given to patient by: Gretchen Short, 02/14/2023, 11:59 AM

## 2023-02-14 NOTE — Progress Notes (Signed)
PROGRESS NOTE   Subjective/Complaints:  Rapid response overnight for tachypnea and chest pain. EKG-NSR, L axis deviation, R BBB, L ventricular hypertrophy with repolarization abn. ANT/septal infarct, age undetermined.   Labs significant for Na 135->131, Uptrending BUN 70s, troponin 52->56 ( prior 131), and elevated BG.   LBM x2 6/16. Fecal occult blood test + 6/16; HgB stable 11.   At 8 am, pulse 117, RR 32 on 2L. Mild vascular congestion on CXR.   Patient reports she "rarely" gets chest pain, and this was severe. Resolved with 2x nitroglycerin.  ROS:  + Chest pain - not current, intermittent today, severe + abdominal cramping/constipation - resolved + generalized weakness - ongoing Denies fevers, chills, N/V, abdominal pain, diarrhea, SOB, cough, chest pain, new weakness or paraesthesias.   As per HPI  Objective:   DG Chest Port 1 View  Result Date: 02/14/2023 CLINICAL DATA:  Chest pain EXAM: PORTABLE CHEST 1 VIEW COMPARISON:  02/04/2023 FINDINGS: Cardiac shadow is enlarged. Aortic calcifications are again seen. Lungs demonstrate no focal infiltrate. Mild vascular congestion is seen. No bony abnormality is noted. IMPRESSION: Mild vascular congestion without focal infiltrate. Electronically Signed   By: Alcide Clever M.D.   On: 02/14/2023 02:00   Recent Labs    02/14/23 0207 02/14/23 0454  WBC 8.4 8.4  HGB 11.4* 11.8*  HCT 34.9* 36.8  PLT 249 242    Recent Labs    02/14/23 0207 02/14/23 0454  NA 135 131*  K 4.3 4.2  CL 93* 95*  CO2 24 24  GLUCOSE 142* 151*  BUN 71* 72*  CREATININE 1.58* 1.55*  CALCIUM 9.1 8.9     Intake/Output Summary (Last 24 hours) at 02/14/2023 0835 Last data filed at 02/14/2023 0756 Gross per 24 hour  Intake 738 ml  Output 525 ml  Net 213 ml         Physical Exam: Vital Signs Blood pressure 135/78, pulse (!) 117, temperature 98.2 F (36.8 C), temperature source Oral, resp. rate  (!) 32, height 5\' 6"  (1.676 m), weight 79.4 kg, SpO2 100 %. Physical Exam Constitutional: No apparent distress. Appropriate appearance for age. +Obese. Laying in bed HENT: No JVD. Neck Supple. Trachea midline. Atraumatic, normocephalic. Eyes: PERRLA. EOMI. Visual fields grossly intact.  Cardiovascular: RRR, no murmurs/rub/gallops. No Edema. Peripheral pulses 2+  Respiratory: Fine bilateral crackles.   + 2.5L [place] Abdomen: + Large hernia/diastasis, normoactive BS throughout.  Skin: C/D/I. No apparent lesions. Chronic skin changes on B/L dorsal feet.  MSK:          Strength: antigravity and against resistance all 4 extremities   No TTP over chest, sternum Neurologic exam:  Cognition: AAO to person, place, time and event.  Insight: Good  insight into current condition.  Sensation: To light touch intact in BL UEs and LEs  Reflexes: 2+ in BL UE and LEs. CN: 2-12 grossly intact.    Assessment/Plan: 1. Functional deficits which require 3+ hours per day of interdisciplinary therapy in a comprehensive inpatient rehab setting. Physiatrist is providing close team supervision and 24 hour management of active medical problems listed below. Physiatrist and rehab team continue to assess barriers to discharge/monitor patient progress toward  functional and medical goals  Care Tool:  Bathing  Bathing activity did not occur: Refused           Bathing assist       Upper Body Dressing/Undressing Upper body dressing   What is the patient wearing?: Dress    Upper body assist Assist Level: Supervision/Verbal cueing    Lower Body Dressing/Undressing Lower body dressing      What is the patient wearing?: Underwear/pull up     Lower body assist Assist for lower body dressing: Moderate Assistance - Patient 50 - 74%     Toileting Toileting    Toileting assist Assist for toileting: Moderate Assistance - Patient 50 - 74%     Transfers Chair/bed transfer  Transfers assist      Chair/bed transfer assist level: Minimal Assistance - Patient > 75%     Locomotion Ambulation   Ambulation assist      Assist level: Minimal Assistance - Patient > 75% Assistive device: Walker-rolling Max distance: 10'   Walk 10 feet activity   Assist     Assist level: Minimal Assistance - Patient > 75% Assistive device: Walker-rolling   Walk 50 feet activity   Assist Walk 50 feet with 2 turns activity did not occur: Safety/medical concerns (Unable to ambulate >10' or perform stair mobility secondary to global deconditioning)         Walk 150 feet activity   Assist Walk 150 feet activity did not occur: Safety/medical concerns         Walk 10 feet on uneven surface  activity   Assist Walk 10 feet on uneven surfaces activity did not occur: Safety/medical concerns         Wheelchair     Assist Is the patient using a wheelchair?: Yes Type of Wheelchair: Manual Wheelchair activity did not occur:  (Therapist dependently propelling manual wheelchair secondary to fatigue)  Wheelchair assist level: Dependent - Patient 0% Max wheelchair distance: 150    Wheelchair 50 feet with 2 turns activity    Assist        Assist Level: Dependent - Patient 0%   Wheelchair 150 feet activity     Assist      Assist Level: Dependent - Patient 0%   Blood pressure 135/78, pulse (!) 117, temperature 98.2 F (36.8 C), temperature source Oral, resp. rate (!) 32, height 5\' 6"  (1.676 m), weight 79.4 kg, SpO2 100 %.  Medical Problem List and Plan: 1. Functional deficits secondary to debility/CHF/acute hypoxic respiratory failure             -patient may shower             -ELOS/Goals: 10-14 days, SPV goals PT/OT - 6/14: D/w family, SW plan for discharge to hospice nursing after IPR; no referrals sent while in acute. Family working with Palliative for OP referral; will let us know if they need anything.   - 6/17: RR overnight for chest pain,  Sob/tachypnea. Troponins stable 30s, EKG no STEMI. Mild weight increase, mild vascular congestion on CXR improved from last exam. Give lasix 20 mg x1, consult HF team. Responsive to PRN nitroglycerin.   2.  Antithrombotics: -DVT/anticoagulation:  Pharmaceutical: Lovenox 40mg  QD             -antiplatelet therapy: Aspirin 81 mg daily 3. Pain Management: Tramadol as needed 4. Mood/Behavior/Sleep: Melatonin 5 mg nightly--sleeping well, cont regimen             -antipsychotic agents: N/A 5. Neuropsych/cognition:  This patient is capable of making decisions on her own behalf. 6. Skin/Wound Care: Routine skin checks 7. Fluids/Electrolytes/Nutrition: Routine in and outs with follow-up chemistries - 6/14: Na low-stable 133 this AM; K 3.2 add K dur 20 meq BID x3 days; BUN/Cr stable 1.2-1.5 -02/12/23 Na stable at 132, K 4.1, BUN/Cr improving, will decrease Kdur to QD x2 more days for now and recheck on routine labs on Monday  - 6/17: Na stable, BUN increasing, Cr stable, K WNL  8.  COPD exacerbation/tobacco use.  Continue nebulizers as advised.  Check oxygen saturations every shift. Tobacco cessation education; does not want patches.    9.  Hypertension.  Lasix 40 mg twice daily, Imdur 60 mg daily, Cozaar 12.5 mg daily, Hydralazine 10 mg every 8 hours, Toprol-XL 25 mg daily.  Monitor with increased mobility - 6/14: Mildly low today; monitor with diuresis, if symptomatic may need cards to assist in adjusting -6/15-16/24 BPs uptrending, monitor for now - 6/17: BP stable/low   Vitals:   02/13/23 0330 02/13/23 0603 02/13/23 1258 02/13/23 2000  BP: 121/63 125/64 (!) 113/56 103/78   02/14/23 0125 02/14/23 0139 02/14/23 0146 02/14/23 0212  BP: 127/74 117/66 101/60 (!) 114/59   02/14/23 0422 02/14/23 0449 02/14/23 0502 02/14/23 0800  BP: (P) 136/78 111/69 100/65 135/78     10.  Diastolic congestive heart failure.  Ejection fraction 20 to 25%.  Continue diuresis.  Jardiance 25 mg daily.  Follow-up heart  failure team. Daily weights.    - 6/14: Likely decreased d/t bed vs standing, monitor trend  -6/15-16/24 wt stable , cont monitoring  - 6/17: Lasix 20 mg once today as below. Per HF team recs: "May be demand ischemia with CHF though wt has continued to decrease.  EKG without acute changes.  Not on tele in rehab  - could consider other CAD beside LAD on imdur 60 - could add ranexa 500 BID or increase imdur to 90 mg daily, will give another 30 mg po now then 90 daily"  Filed Weights   02/11/23 0404 02/12/23 0445 02/13/23 0700  Weight: (S) 78.4 kg 78.8 kg 79.4 kg      11.  AKI on CKD/hyponatremia.  Follow-up chemistries.  Entresto on hold until renal function improved. - Cr stable 1.5; has been 1.2-1.5; monitor BMP in AM d/t low BP and significant diuresis -02/12/23 Cr stable 1.38; monitor routine labs - 6/17: Cr 1.5; stable. BUN uptrending. Recheck in AM  12.  Hyperlipidemia.  Lipitor 80mg  QD   13.  Type II diabetes mellitus.  Hemoglobin A1c 6.0.   - 6/14: BG stable, continue SSI and Jardiance 25mg  every day  -6/15-16/24 CBGs stable, monitor Recent Labs    02/13/23 1633 02/13/23 2050 02/14/23 0537  GLUCAP 114* 200* 149*       14.  Constipation. Failed enema today. Passing gas. Check KUB - mild gaseous distention, small volume stool.  Adjust bowel program - Sennakot S 2 tabs BID, PRN sorbitol.   - 6/14: Small BM and smear yesterday. 1x sorbitol. Add Miralax daily to regimen. Add PRN suppository -02/12/23 LBM this morning, monitor' -02/13/23 multiple BMs overnight, reportedly black/dark colored; pt asymptomatic. H&H showing slight decrease in Hgb 11.7 (previously 12.3-12.8); will recheck H&H at 6hr mark, get fecal occult card done, and monitor closely; if continuing to have melanotic stools or worsening anemia, may need to consult GI/start protonix/stop lovenox; will monitor.  - 6/17: + occult blood x2, HgB stable, start protonix BID    LOS:  4 days A FACE TO FACE EVALUATION WAS  PERFORMED  Angelina Sheriff 02/14/2023, 8:35 AM

## 2023-02-15 DIAGNOSIS — R5381 Other malaise: Secondary | ICD-10-CM | POA: Diagnosis not present

## 2023-02-15 LAB — BASIC METABOLIC PANEL
Anion gap: 11 (ref 5–15)
BUN: 67 mg/dL — ABNORMAL HIGH (ref 8–23)
CO2: 28 mmol/L (ref 22–32)
Calcium: 9.2 mg/dL (ref 8.9–10.3)
Chloride: 97 mmol/L — ABNORMAL LOW (ref 98–111)
Creatinine, Ser: 1.36 mg/dL — ABNORMAL HIGH (ref 0.44–1.00)
GFR, Estimated: 39 mL/min — ABNORMAL LOW (ref >60)
Glucose, Bld: 145 mg/dL — ABNORMAL HIGH (ref 70–99)
Potassium: 4.3 mmol/L (ref 3.5–5.1)
Sodium: 136 mmol/L (ref 135–145)

## 2023-02-15 LAB — CBC
HCT: 34.7 % — ABNORMAL LOW (ref 36.0–46.0)
Hemoglobin: 11.1 g/dL — ABNORMAL LOW (ref 12.0–15.0)
MCH: 30 pg (ref 26.0–34.0)
MCHC: 32 g/dL (ref 30.0–36.0)
MCV: 93.8 fL (ref 80.0–100.0)
Platelets: 232 10*3/uL (ref 150–400)
RBC: 3.7 MIL/uL — ABNORMAL LOW (ref 3.87–5.11)
RDW: 14.7 % (ref 11.5–15.5)
WBC: 9.1 10*3/uL (ref 4.0–10.5)
nRBC: 0 % (ref 0.0–0.2)

## 2023-02-15 LAB — GLUCOSE, CAPILLARY
Glucose-Capillary: 164 mg/dL — ABNORMAL HIGH (ref 70–99)
Glucose-Capillary: 117 mg/dL — ABNORMAL HIGH (ref 70–99)
Glucose-Capillary: 146 mg/dL — ABNORMAL HIGH (ref 70–99)
Glucose-Capillary: 163 mg/dL — ABNORMAL HIGH (ref 70–99)

## 2023-02-15 MED ORDER — BUSPIRONE HCL 15 MG PO TABS
7.5000 mg | ORAL_TABLET | Freq: Two times a day (BID) | ORAL | Status: DC
  Administered 2023-02-15 – 2023-02-17 (×5): 7.5 mg via ORAL
  Filled 2023-02-15 (×5): qty 1

## 2023-02-15 MED ORDER — TAMSULOSIN HCL 0.4 MG PO CAPS
0.4000 mg | ORAL_CAPSULE | Freq: Every day | ORAL | Status: DC
  Administered 2023-02-15: 0.4 mg via ORAL
  Filled 2023-02-15: qty 1

## 2023-02-15 MED ORDER — ALPRAZOLAM 0.25 MG PO TABS
0.2500 mg | ORAL_TABLET | Freq: Two times a day (BID) | ORAL | Status: DC | PRN
  Administered 2023-02-16 – 2023-02-18 (×3): 0.25 mg via ORAL
  Filled 2023-02-15 (×3): qty 1

## 2023-02-15 MED ORDER — ACETAMINOPHEN 500 MG PO TABS
1000.0000 mg | ORAL_TABLET | Freq: Three times a day (TID) | ORAL | Status: DC
  Administered 2023-02-15 – 2023-02-25 (×30): 1000 mg via ORAL
  Filled 2023-02-15 (×30): qty 2

## 2023-02-15 NOTE — Progress Notes (Signed)
Patient ID: Angela Lucero, female   DOB: [birth year], 82 y.o.   MRN: [id number]  [id number] spoke with pt son Angela Lucero to provide updates from team conference and d/c date 6/28. Discussed pt will need supervision with bathing, and likely ot be Mod I at discharge. He is aware pt may need oxygen at discharge. Family edu scheduled for Tues (6/25) 1pm-4pm.  Cecile Sheerer, MSW, LCSWA Office: [phone] Cell: [phone] Fax: [phone]

## 2023-02-15 NOTE — Progress Notes (Signed)
Patient awaken from her sleep with acute  onset of chest pain 8/10 on pain scale states " This time it feels like an elephant is sitting on my chest", sl NTG adminstered and pain with VS monitored,, Pain remains ay 7-6/10 and NTG SL repeated,, patient was slightly repositioned in bed and denies a need for additional SL NTG because she states she can tell the pain sensation is resolving and she just wanted to wait. Patient was constantly monitored and assessed by writer, PO liquids provided per patient requires  Patient states CP is at her baseline 4/10 and is feeling much better    0510 Call to room patient is extremely anxious and emotional stating she is having pain TO her lower back, having trouble breathing, and "can't do this anymore", emotional support provided, able to get patient to relax,

## 2023-02-15 NOTE — Progress Notes (Signed)
PROGRESS NOTE   Subjective/Complaints:  Recurrent chest pain overnight responsive to NTG x1.  Extremely fatigued this AM, anxious. Per therapies did participate well despite that. Patient does admit to significant underlying anxiety about her decline in function, what home will look like, and does think it is playing into her episodic chest pain. She agrees with initiation of medication to help treat. She has no prior Hx anxiety or depression.  ROS:  + Chest pain/angina - intermittent + abdominal cramping/constipation - resolved + generalized weakness - ongoing Denies fevers, chills, N/V, abdominal pain, diarrhea, SOB, cough, chest pain, new weakness or paraesthesias.   As per HPI  Objective:   DG Chest Port 1 View  Result Date: 02/14/2023 CLINICAL DATA:  Chest pain EXAM: PORTABLE CHEST 1 VIEW COMPARISON:  02/04/2023 FINDINGS: Cardiac shadow is enlarged. Aortic calcifications are again seen. Lungs demonstrate no focal infiltrate. Mild vascular congestion is seen. No bony abnormality is noted. IMPRESSION: Mild vascular congestion without focal infiltrate. Electronically Signed   By: Alcide Clever M.D.   On: 02/14/2023 02:00   Recent Labs    02/14/23 0207 02/14/23 0454  WBC 8.4 8.4  HGB 11.4* 11.8*  HCT 34.9* 36.8  PLT 249 242    Recent Labs    02/14/23 0207 02/14/23 0454  NA 135 131*  K 4.3 4.2  CL 93* 95*  CO2 24 24  GLUCOSE 142* 151*  BUN 71* 72*  CREATININE 1.58* 1.55*  CALCIUM 9.1 8.9     Intake/Output Summary (Last 24 hours) at 02/15/2023 0943 Last data filed at 02/14/2023 1835 Gross per 24 hour  Intake 477 ml  Output 1100 ml  Net -623 ml         Physical Exam: Vital Signs Blood pressure 139/66, pulse (!) 107, temperature 98.1 F (36.7 C), temperature source Oral, resp. rate 18, height 5\' 6"  (1.676 m), weight 76.8 kg, SpO2 98 %. Physical Exam Constitutional: No apparent distress. Appropriate  appearance for age. +Obese.Laying in bed.  HENT: No JVD. Neck Supple. Trachea midline. Atraumatic, normocephalic. Eyes: PERRLA. EOMI. Visual fields grossly intact.  Cardiovascular: RRR, no murmurs/rub/gallops. No Edema. Peripheral pulses 2+  Respiratory: CTAB.   + 2 L [place] Abdomen: + Large hernia/diastasis, normoactive BS throughout.  Skin: C/D/I. No apparent lesions. Chronic skin changes on B/L dorsal feet.  MSK:          Strength: antigravity and against resistance all 4 extremities; 4/5   No TTP over chest, sternum Neurologic exam:  Cognition: AAO to person, place, time and event.  Insight: Good  insight into current condition.  Sensation: To light touch intact in BL UEs and LEs  Reflexes: 2+ in BL UE and LEs. CN: 2-12 grossly intact.    Assessment/Plan: 1. Functional deficits which require 3+ hours per day of interdisciplinary therapy in a comprehensive inpatient rehab setting. Physiatrist is providing close team supervision and 24 hour management of active medical problems listed below. Physiatrist and rehab team continue to assess barriers to discharge/monitor patient progress toward functional and medical goals  Care Tool:  Bathing  Bathing activity did not occur: Refused           Bathing assist  Upper Body Dressing/Undressing Upper body dressing   What is the patient wearing?: Dress    Upper body assist Assist Level: Supervision/Verbal cueing    Lower Body Dressing/Undressing Lower body dressing      What is the patient wearing?: Underwear/pull up     Lower body assist Assist for lower body dressing: Moderate Assistance - Patient 50 - 74%     Toileting Toileting    Toileting assist Assist for toileting: Moderate Assistance - Patient 50 - 74%     Transfers Chair/bed transfer  Transfers assist     Chair/bed transfer assist level: Minimal Assistance - Patient > 75%     Locomotion Ambulation   Ambulation assist      Assist level:  Minimal Assistance - Patient > 75% Assistive device: Walker-rolling Max distance: 10'   Walk 10 feet activity   Assist     Assist level: Minimal Assistance - Patient > 75% Assistive device: Walker-rolling   Walk 50 feet activity   Assist Walk 50 feet with 2 turns activity did not occur: Safety/medical concerns (Unable to ambulate >10' or perform stair mobility secondary to global deconditioning)         Walk 150 feet activity   Assist Walk 150 feet activity did not occur: Safety/medical concerns         Walk 10 feet on uneven surface  activity   Assist Walk 10 feet on uneven surfaces activity did not occur: Safety/medical concerns         Wheelchair     Assist Is the patient using a wheelchair?: Yes Type of Wheelchair: Manual Wheelchair activity did not occur:  (Therapist dependently propelling manual wheelchair secondary to fatigue)  Wheelchair assist level: Dependent - Patient 0% Max wheelchair distance: 150    Wheelchair 50 feet with 2 turns activity    Assist        Assist Level: Dependent - Patient 0%   Wheelchair 150 feet activity     Assist      Assist Level: Dependent - Patient 0%   Blood pressure 139/66, pulse (!) 107, temperature 98.1 F (36.7 C), temperature source Oral, resp. rate 18, height 5\' 6"  (1.676 m), weight 76.8 kg, SpO2 98 %.  Medical Problem List and Plan: 1. Functional deficits secondary to debility/CHF/acute hypoxic respiratory failure             -patient may shower             -ELOS/Goals: 10-14 days, SPV goals PT/OT - 6/28 - 6/14: D/w family, SW plan for discharge to hospice nursing after IPR; no referrals sent while in acute. Family working with Palliative for OP referral; will let us know if they need anything.   - 6/17: RR overnight for chest pain, Sob/tachypnea. Troponins stable 30s, EKG no STEMI. Mild weight increase, mild vascular congestion on CXR improved from last exam. Give lasix 20 mg x1, consult  HF team. Responsive to PRN nitroglycerin.  - 6/18: Per son, will need to be intermittent SPV at home with neighbors; patient will be paying caregiver every other day and having her call on off days. Per therapies, self-limiting but is improving. With PT today CGA 13 ft, stair and car transfer goals at SPV and all otehrs Mod I goals.   2.  Antithrombotics: -DVT/anticoagulation:  Pharmaceutical: Lovenox 40mg  QD             -antiplatelet therapy: Aspirin 81 mg daily 3. Pain Management: Tramadol as needed 4. Mood/Behavior/Sleep: Melatonin 5  mg nightly--sleeping well, cont regimen             -antipsychotic agents: N/A  - 6/18: Start Buspar 7.5 mg BID for anxiety, Xanax 0.25 mg BID PRN for severe episodes while uptitrating  5. Neuropsych/cognition: This patient is capable of making decisions on her own behalf. 6. Skin/Wound Care: Routine skin checks 7. Fluids/Electrolytes/Nutrition: Routine in and outs with follow-up chemistries - 6/14: Na low-stable 133 this AM; K 3.2 add K dur 20 meq BID x3 days; BUN/Cr stable 1.2-1.5 -02/12/23 Na stable at 132, K 4.1, BUN/Cr improving, will decrease Kdur to QD x2 more days for now and recheck on routine labs on Monday  - 6/17: Na stable, BUN increasing, Cr stable, K WNL - 6/18: Na stable, K WNL  8.  COPD exacerbation/tobacco use.  Continue nebulizers as advised.  Check oxygen saturations every shift. Tobacco cessation education; does not want patches.    9.  Hypertension.  Lasix 40 mg twice daily, Imdur 60 mg daily, Cozaar 12.5 mg daily, Hydralazine 10 mg every 8 hours, Toprol-XL 25 mg daily.  Monitor with increased mobility - 6/14: Mildly low today; monitor with diuresis, if symptomatic may need cards to assist in adjusting -6/15-16/24 BPs uptrending, monitor for now - 6/17-18: BP stable/low   Vitals:   02/14/23 0800 02/14/23 0900 02/14/23 1006 02/14/23 1327  BP: 135/78 114/68 111/60 (!) 95/44   02/14/23 1717 02/14/23 1935 02/15/23 0231 02/15/23 0236  BP:  (!) 106/50 134/67 136/73 109/65   02/15/23 0238 02/15/23 0253 02/15/23 0308 02/15/23 0805  BP: 109/65 106/62 124/63 139/66     10.  Diastolic congestive heart failure/intermittent chest pain.  Ejection fraction 20 to 25%.  Continue diuresis.  Jardiance 25 mg daily.  Follow-up heart failure team. Daily weights.    - 6/14: Likely decreased d/t bed vs standing, monitor trend  -6/15-16/24 wt stable , cont monitoring  - 6/17: Lasix 20 mg once today as below. Per HF team recs: "May be demand ischemia with CHF though wt has continued to decrease.  EKG without acute changes.  Not on tele in rehab  - could consider other CAD beside LAD on imdur 60 - could add ranexa 500 BID or increase imdur to 90 mg daily, will give another 30 mg po now then 90 daily"  - 6/18: 1x NTG this AM for CP; appreciate HF team recs, will start anxiety treatment today as well as above.   Filed Weights   02/12/23 0445 02/13/23 0700 02/14/23 1600  Weight: 78.8 kg 79.4 kg 76.8 kg      11.  AKI on CKD/hyponatremia.  Follow-up chemistries.  Entresto on hold until renal function improved. - Cr stable 1.5; has been 1.2-1.5; monitor BMP in AM d/t low BP and significant diuresis -02/12/23 Cr stable 1.38; monitor routine labs - 6/17: Cr 1.5; stable. BUN uptrending. Recheck in AM   - 6/18: Cr 1.3, BUN 67; improved  12.  Hyperlipidemia.  Lipitor 80mg  QD   13.  Type II diabetes mellitus.  Hemoglobin A1c 6.0.   - 6/14: BG stable, continue SSI and Jardiance 25mg  every day  - 6/15-18 CBGs stable, monitor Recent Labs    02/14/23 1705 02/14/23 2101 02/15/23 0615  GLUCAP 122* 151* 164*       14.  Constipation. Failed enema today. Passing gas. Check KUB - mild gaseous distention, small volume stool.  Adjust bowel program - Sennakot S 2 tabs BID, PRN sorbitol.   - 6/14: Small BM and smear  yesterday. 1x sorbitol. Add Miralax daily to regimen. Add PRN suppository -02/12/23 LBM this morning, monitor' -02/13/23 multiple BMs overnight,  reportedly black/dark colored; pt asymptomatic. H&H showing slight decrease in Hgb 11.7 (previously 12.3-12.8); will recheck H&H at 6hr mark, get fecal occult card done, and monitor closely; if continuing to have melanotic stools or worsening anemia, may need to consult GI/start protonix/stop lovenox; will monitor.  - 6/17: + occult blood x2, HgB stable, start protonix BID  15. Urinary retention. High PVR 700 yesterday. Check x3 days, start Flomax 0.4 mg at bedtime.   LOS: 5 days A FACE TO FACE EVALUATION WAS PERFORMED  Angelina Sheriff 02/15/2023, 9:43 AM

## 2023-02-15 NOTE — Progress Notes (Addendum)
Occupational Therapy Session Note  Patient Details  Name: Angela Lucero MRN: [id number] Date of Birth: 10-13-1940  Today's Date: 02/15/2023 Session 1 OT Individual Time: 4098-1191 OT Individual Time Calculation (min): 35 min Today's Date: 02/15/2023 OT Missed Time: 25 Minutes Missed Time Reason: MD hold (comment) (hold orders still placed this morning. COuld not see pt until lifted.)  Session 2 OT Individual Time: 1345-1440 OT Individual Time Calculation (min): 55 min    Short Term Goals: Week 1:  OT Short Term Goal 1 (Week 1): Pt will complete LB dressing with CGA OT Short Term Goal 2 (Week 1): Pt wil complete toileting with Min A OT Short Term Goal 3 (Week 1): Pt will complete LB bathing with CGA  Skilled Therapeutic Interventions/Progress Updates:  Session 1   Pt missed 25 minutes of session due to Hold orders still placed from chest pain yesterday. OT had to wait for new orders before seeing pt. Pt greeted mid transfer with nursing to [facility]. Pt was able to void bladder. Pt on 2L of O2 throughout session. Sit<>stand w/ RW and min A, then pt able to perform peri-care with encouragement from OT. Pt then pivoted to wc and needed extended rest break to recover.UB bathing/dressing completed from wc at the sink with min A and multiple rest breaks. Educated on LB dressing strategies with pt still needing mod A to thread pants. Pt reported very low energy level and felt pt could only perform one more stand before reaching max fatigue. Pt brought to EOB in wc and stood w. RW and min A. Mod A to pull up pants then mid A to pivot to bed. Mod A to then lift LE's back into bed. Pt left sidelying in bed with bed alarm on, call bell in reach, and needs met.   Session 2 Pt greeted semi-reclined in bed, initially trying to decline therapy. Pt agreeable to get up to use the bathroom and also wanted her linens changed. Pt completed mobility with increased time, HOB elevated, bed rails, and supervision. She needed  min A to don slide on shoes. She used RW to ambulate to bathroom with CGA. On 2 L of O2.. Pt needed min A to manage clothing and encouragement to try to do it herself. Overall min A to for clothing management. Pt voided bladder, then needed encouragement again to try to perform peri-care and clothing management. Pt ambulated back out of bathrroom with RW and CGA. Pt very OOB and reported max fatigue. Pt took extended rest break, then agreeable to try to go to the gym in wc. PT completed 4 mins on [employer] arm bike for UB strength/endurance before reaching max fatigue. Pt returned to room and bed had not been made. Pt reluctantly agreeable to stay up in chair until bed was made. PT left with chair alarm on, call bell in reach, and needs met.  Therapy Documentation Precautions:  Precautions Precautions: Fall Precaution Comments: Monitor SpO2, L knee pain Restrictions Weight Bearing Restrictions: No Pain: Pain Assessment Pain Scale: 0-10 (Simultaneous filing. User may not have seen previous data.) Pain Score: 5  (medication intervention success from 7 to 5 on pain scale  Simultaneous filing. User may not have seen previous data.) Pain Type: Acute pain Pain Location: Back Pain Orientation: Lower Pain Descriptors / Indicators: Aching Pain Frequency: Intermittent Pain Onset: Gradual Patients Stated Pain Goal: 4 Pain Intervention(s): RN made aware;Relaxation;Rest;Repositioned;Medication (See eMAR)   Therapy/Group: Individual Therapy  Mal Amabile 02/15/2023, 3:26 PM

## 2023-02-15 NOTE — Progress Notes (Signed)
Physical Therapy Session Note  Patient Details  Name: Angela Lucero MRN: [id number] Date of Birth: Feb 12, 1941  Today's Date: 02/15/2023 PT Individual Time: 1445-1515 PT Individual Time Calculation (min): 30 min   Short Term Goals: Week 1:  PT Short Term Goal 1 (Week 1): Patient will initiate stair mobility PT Short Term Goal 2 (Week 1): Patient will ambulate >50' with LRAD and MinA PT Short Term Goal 3 (Week 1): Patient will tolerate staying out of the bed between therapy sessions  Skilled Therapeutic Interventions/Progress Updates:    Chart reviewed. Pt initially reported high fatigue and requested to not participate in therapy. Pt agreeable to participate in functional transfers to return to bed and light therex at bedside. PT confirmed pt understanding of need to make time up later and again encouraged participation. Pt continued to agree to light bedside exercise and confirmed understanding of need to make time up at a later date. Pt received seated in WC with 9/10 c/o pain in knee. Also of note, pt on 2L O2 via [place] t/o session. Session focused on functional transfers to promote safe home access. Pt initiated session with transfer to bed using S + RW. Pt then completed blocked practice of side stepping along bedside with S + RW. Pt required seated rest breaks 2/2 fatigue. Pt then transferred sit.supine with S. At end of session, pt was left semi-reclined in bed with alarm engaged, nurse call bell and all needs in reach.     Therapy Documentation Precautions:  Precautions Precautions: Fall Precaution Comments: Monitor SpO2, L knee pain Restrictions Weight Bearing Restrictions: No General: PT Amount of Missed Time (min): 15 Minutes PT Missed Treatment Reason: Patient fatigue    Therapy/Group: Individual Therapy  Dionne Milo, PT, DPT 02/15/2023, 3:27 PM

## 2023-02-15 NOTE — Progress Notes (Signed)
Rounding Note    Patient Name: Angela Lucero Date of Encounter: 02/15/2023  [facility] Cardiologist: Gypsy Balsam, MD   Subjective   Did well during PT today.  No chest pain.  Inpatient Medications    Scheduled Meds:  acetaminophen  1,000 mg Oral TID   aspirin EC  81 mg Oral Daily   atorvastatin  80 mg Oral Daily   empagliflozin  25 mg Oral Daily   enoxaparin (LOVENOX) injection  30 mg Subcutaneous Q24H   feeding supplement  237 mL Oral BID BM   furosemide  40 mg Oral BID   hydrALAZINE  10 mg Oral Q8H   insulin aspart  0-9 Units Subcutaneous TID WC   ipratropium-albuterol  3 mL Nebulization BID   isosorbide mononitrate  90 mg Oral Daily   losartan  12.5 mg Oral Daily   melatonin  5 mg Oral QHS   metoprolol succinate  25 mg Oral Daily   pantoprazole  40 mg Oral BID AC   polyethylene glycol  17 g Oral Daily   senna-docusate  2 tablet Oral BID   umeclidinium-vilanterol  1 puff Inhalation Daily   Continuous Infusions:  PRN Meds: albuterol, Glycerin (Adult), nitroGLYCERIN, simethicone, sorbitol, traMADol   Vital Signs    Vitals:   02/15/23 0304 02/15/23 0308 02/15/23 0743 02/15/23 0805  BP:  124/63  139/66  Pulse: 86 84 86 (!) 107  Resp:   18   Temp:      TempSrc:      SpO2: 98% 95% 98%   Weight:      Height:        Intake/Output Summary (Last 24 hours) at 02/15/2023 0955 Last data filed at 02/14/2023 1835 Gross per 24 hour  Intake 477 ml  Output 1100 ml  Net -623 ml      02/14/2023    4:00 PM 02/13/2023    7:00 AM 02/12/2023    4:45 AM  Last 3 Weights  Weight (lbs) 169 lb 5 oz 175 lb 0.7 oz 173 lb 11.6 oz  Weight (kg) 76.8 kg 79.4 kg 78.8 kg      Physical Exam   GEN: No acute distress.   Neck: No JVD Cardiac: RRR, no murmurs, rubs, or gallops.  Respiratory: Clear to auscultation bilaterally. GI: Soft, nontender, non-distended  MS: No edema; No deformity. Neuro:  Nonfocal  Psych: Normal affect   Labs    High Sensitivity Troponin:    Recent Labs  Lab 02/04/23 1514 02/07/23 1622 02/07/23 1823 02/14/23 0207 02/14/23 0454  TROPONINIHS 136* 135* 131* 52* 56*     Chemistry Recent Labs  Lab 02/11/23 0417 02/12/23 0531 02/14/23 0207 02/14/23 0454  NA 133* 132* 135 131*  K 3.4* 4.1 4.3 4.2  CL 91* 90* 93* 95*  CO2 30 30 24 24   GLUCOSE 137* 124* 142* 151*  BUN 78* 66* 71* 72*  CREATININE 1.55* 1.38* 1.58* 1.55*  CALCIUM 9.1 8.9 9.1 8.9  PROT 6.0*  --   --   --   ALBUMIN 3.0*  --   --   --   AST 34  --   --   --   ALT 32  --   --   --   ALKPHOS 33*  --   --   --   BILITOT 0.7  --   --   --   GFRNONAA 34* 39* 33* 34*  ANIONGAP 12 12 18* 12    Lipids No results for  input(s): "CHOL", "TRIG", "HDL", "LABVLDL", "LDLCALC", "CHOLHDL" in the last 168 hours.  Hematology Recent Labs  Lab 02/11/23 0417 02/13/23 1048 02/13/23 1619 02/14/23 0207 02/14/23 0454  WBC 11.9*  --   --  8.4 8.4  RBC 4.15  --   --  3.84* 4.00  HGB 12.5   < > 11.5* 11.4* 11.8*  HCT 37.7   < > 36.2 34.9* 36.8  MCV 90.8  --   --  90.9 92.0  MCH 30.1  --   --  29.7 29.5  MCHC 33.2  --   --  32.7 32.1  RDW 14.7  --   --  14.8 14.8  PLT 284  --   --  249 242   < > = values in this interval not displayed.   Thyroid No results for input(s): "TSH", "FREET4" in the last 168 hours.  BNPNo results for input(s): "BNP", "PROBNP" in the last 168 hours.  DDimer No results for input(s): "DDIMER" in the last 168 hours.   Radiology    DG Chest Port 1 View  Result Date: 02/14/2023 CLINICAL DATA:  Chest pain EXAM: PORTABLE CHEST 1 VIEW COMPARISON:  02/04/2023 FINDINGS: Cardiac shadow is enlarged. Aortic calcifications are again seen. Lungs demonstrate no focal infiltrate. Mild vascular congestion is seen. No bony abnormality is noted. IMPRESSION: Mild vascular congestion without focal infiltrate. Electronically Signed   By: Alcide Clever M.D.   On: 02/14/2023 02:00     Assessment and plan     82 y.o. female with recent chest discomfort.  Known EF  20 to 25% with chronic systolic heart failure.  CAD, collateral flow, treated medically.  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  CKD 3A  -Yesterday increased isosorbide from 60-90.  Feels better.  Successfully completed physical therapy without any anginal symptoms.  Continue with current management. -For systolic heart failure on low-dose ARB 12.5 mg as well as low-dose metoprolol 25 mg and Jardiance 25 mg as well.  Has first-degree AV block.  Being careful.  We will go ahead and sign off.  Please let us know if we can be of further assistance.    For questions or updates, please contact [facility] Please consult [email or web address] for contact info under        Signed, Donato Schultz, MD  02/15/2023, 9:55 AM

## 2023-02-15 NOTE — Plan of Care (Signed)
  Problem: RH Bathing Goal: LTG Patient will bathe all body parts with assist levels (OT) Description: LTG: Patient will bathe all body parts with assist levels (OT) Flowsheets (Taken 02/15/2023 1044) LTG: Pt will perform bathing with assistance level/cueing: Supervision/Verbal cueing Note: Goal downgraded 6/18-ESD

## 2023-02-15 NOTE — Progress Notes (Signed)
Physical Therapy Session Note  Patient Details  Name: Angela Lucero MRN: [id number] Date of Birth: [birth year]  Today's Date: 02/15/2023 PT Individual Time: 0900-1000 PT Individual Time Calculation (min): 60 min   Short Term Goals: Week 1:  PT Short Term Goal 1 (Week 1): Patient will initiate stair mobility PT Short Term Goal 2 (Week 1): Patient will ambulate >50' with LRAD and MinA PT Short Term Goal 3 (Week 1): Patient will tolerate staying out of the bed between therapy sessions  Skilled Therapeutic Interventions/Progress Updates:  Patient greeted supine in bed and agreeable to PT treatment session. Patient transitioned to sitting EOB with use of bed rail and supv. Patient performed sit/stand and stand pivot transfer from EOB to wheelchair with RW and CGA. Patient requesting to use the restroom prior to exiting the gym- Patient performed sit/stand and stand pivot transfer to/from wheelchair and toilet with use of single grab bar and CGA. Patient required MaxA for clothing/brief management, however patient was able to perform pericare independently- NT notified of void and agreeable to charting in the flowsheet. Patient wheeled to/from her room and day room for time management and energy conservation. Patient continues to be on 2L oxygen via [place] with SpO2 >98% at rest and with exertion- Therapist managed oxygen tank/line throughout treatment session for improved safety.   Patient tasked with standing with S UE support while reaching for bean bags on her R side and then tossing them at the corn hole board, completed x18 total.   Patient then tasked with performing the same activity as above, however this time unsupported and patient was to reach with her LUE for the bean bags on her L side and then passing them to her Rt hand and tossing then tossing them at the corn hole board, completed x18 total. Patient required extended seated rest break after each activity secondary SOB and fatigue- VC for pursed  lip breathing.   Patient gait trained x13' with RW and CGA for safety- VC for improved postural extension and stepping within the frame of the walker. Patient required an extended seated rest break post gait trial secondary to SOB and fatigue.   Patient returned to her room where she performed sit/stand and stand pivot transfer from wheelchair to sitting EOB with RW and CGA. Patient then transitioned to supine with supv. Patient left supine in bed with bed alarm on, call bell within reach and all needs met.    Therapy Documentation Precautions:  Precautions Precautions: Fall Precaution Comments: Monitor SpO2, L knee pain Restrictions Weight Bearing Restrictions: No  Pain: 3/10 pain at rest- Low back pain which patient reports she had prior to hospitalization.   Therapy/Group: Individual Therapy  Uzziah Rigg 02/15/2023, 7:52 AM

## 2023-02-15 NOTE — Patient Care Conference (Signed)
Inpatient RehabilitationTeam Conference and Plan of Care Update Date: 02/15/2023   Time: 10:35 AM    Patient Name: Angela Lucero      Medical Record Number: [id number]  Date of Birth: Sep 26, 1940 Sex: Female         Room/Bed: 4W21C/4W21C-01 Payor Info: Payor: MEDICARE / Plan: MEDICARE PART A AND B / Product Type: *No Product type* /    Admit Date/Time:  02/10/2023 12:55 PM  Primary Diagnosis:  Debility  Hospital Problems: Principal Problem:   Debility Active Problems:   Melanotic stools   Anemia    Expected Discharge Date: Expected Discharge Date: 02/25/23  Team Members Present: Physician leading conference: Dr. Elijah Birk Social Worker Present: Cecile Sheerer, LCSWA Nurse Present: Vedia Pereyra, RN PT Present: Amedeo Plenty, PT OT Present: Kearney Hard, OT PPS Coordinator present : Fae Pippin, SLP     Current Status/Progress Goal Weekly Team Focus  Bowel/Bladder   Patient is continent with occassional episodes of incontinence of bladder, she is continent of her bowel, LBM 02/14/23   Maintain continence of B/B   Assess toileting needs, QS and PRN    Swallow/Nutrition/ Hydration               ADL's   Min A for functional transfers w/ RW, min A for UB ADLs, Mod A for LB ADLs   Mod I   Self-care retraining, activty tolernace, dc planning, barrier is endurance and some self-care limitations    Mobility   MinA for all functional mobility with the use of a RW- Limited by endurance/activity tolerance with global deconditioning   Supv/ModI with LRAD  Barriers: Amount of assistance required currently with limited family support at home. Currently on supplemental oxygen 2-4L via [place]. Continue to focus on endurance/activity tolerance and global strengthening for improved independence with functional mobility.    Communication                Safety/Cognition/ Behavioral Observations               Pain   Episodes of Chest Pains that requires SL NTG 2-3  during the this time, resolves afterward at base line 4/10    Less than 4 after medications administered.   Assess QS/PRN each onset of CP and after each onset Chest pain    Skin   Skin intact   Maintain skin integrity  Assess skin QS/PRN      Discharge Planning:  Pt reports that her plan is to return home, and will have intermittent support from her neighbor who will check in on her once a day, and her son can provide PRN support as he works. SW will confirm there are no barriers to discharge.   Team Discussion: Debility. Going home with hospice in place. 06/17 therapies held due to angina. Work-up completed by Cards. Increased Imdur with angina resolving after 2 doses of Nitroglycerin tabs. Angina again this AM for which Nitroglycerin x 1 tab given with resolution of pain. Also medically complicated by AKI, CHF (daily weight), anxiety(xanax & Buspar) started, and new oxygen use. Urinary retention addressing with Flomax at HS. Patient is self limiting at times, SOB, and requires frequent rest breaks with task.  Patient on target to meet rehab goals: yes, has the ability to meet goals with increased time to complete task and frequent rest breaks.  *See Care Plan and progress notes for long and short-term goals.   Revisions to Treatment Plan:  Medication adjustments. PVRs. Monitor labs/VS. Monitor daily  weight Teaching Needs: Medications, safety, self care, gait/transfer training, skin care, etc.   Current Barriers to Discharge: Lack of/limited family support, Behavior, and New oxygen, smokes, and limited physical assistance at discharge.   Possible Resolutions to Barriers: Family education, smoking cessation, hire caregivers or have aid for ADLs, order recommended DME     Medical Summary Current Status: medically complicated by CHF, intermittent angina, anxiety, SOB on new oxygen, AKI on CKD, constipation, anemia with +occult blood, hypertension and diabetes  Barriers to Discharge:  Cardiac Complications;Behavior/Mood;Electrolyte abnormality;Medical stability;Self-care education;Renal Insufficiency/Failure;Uncontrolled Hypertension;Uncontrolled Pain;Oxygen Requirement  Barriers to Discharge Comments: oxygen requirements, intermittent angina, advanced HF, AKI on CKD Possible Resolutions to [employer] Focus: Consults from heart failure team to assist in medication optimization, palliative assisting in transition home with OP hospice nursing, monitorring labs for worsening AKI with medication adjustments, providing encouragement and treating anxiety   Continued Need for Acute Rehabilitation Level of Care: The patient requires daily medical management by a physician with specialized training in physical medicine and rehabilitation for the following reasons: Direction of a multidisciplinary physical rehabilitation program to maximize functional independence : Yes Medical management of patient stability for increased activity during participation in an intensive rehabilitation regime.: Yes Analysis of laboratory values and/or radiology reports with any subsequent need for medication adjustment and/or medical intervention. : Yes   I attest that I was present, lead the team conference, and concur with the assessment and plan of the team.   KAHLILAH EPP 02/15/2023, 1:33 PM

## 2023-02-16 DIAGNOSIS — R5381 Other malaise: Secondary | ICD-10-CM | POA: Diagnosis not present

## 2023-02-16 LAB — URINALYSIS, ROUTINE W REFLEX MICROSCOPIC
Bilirubin Urine: NEGATIVE
Glucose, UA: >=500 mg/dL — AB
Hgb urine dipstick: NEGATIVE
Ketones, ur: NEGATIVE mg/dL
Nitrite: NEGATIVE
Protein, ur: 30 mg/dL — AB
Specific Gravity, Urine: 1.012 (ref 1.005–1.030)
pH: 5 (ref 5.0–8.0)

## 2023-02-16 LAB — GLUCOSE, CAPILLARY
Glucose-Capillary: 177 mg/dL — ABNORMAL HIGH (ref 70–99)
Glucose-Capillary: 199 mg/dL — ABNORMAL HIGH (ref 70–99)
Glucose-Capillary: 111 mg/dL — ABNORMAL HIGH (ref 70–99)
Glucose-Capillary: 152 mg/dL — ABNORMAL HIGH (ref 70–99)

## 2023-02-16 MED ORDER — POLYETHYLENE GLYCOL 3350 17 G PO PACK
17.0000 g | PACK | Freq: Two times a day (BID) | ORAL | Status: DC
  Administered 2023-02-16 – 2023-02-20 (×5): 17 g via ORAL
  Filled 2023-02-16 (×18): qty 1

## 2023-02-16 MED ORDER — TAMSULOSIN HCL 0.4 MG PO CAPS
0.8000 mg | ORAL_CAPSULE | Freq: Every day | ORAL | Status: DC
  Administered 2023-02-16: 0.8 mg via ORAL
  Filled 2023-02-16: qty 2

## 2023-02-16 NOTE — Progress Notes (Signed)
Physical Therapy Session Note  Patient Details  Name: Angela Lucero MRN: [id number] Date of Birth: 1941/06/04  Today's Date: 02/16/2023 PT Individual Time: 1130-1200 PT Individual Time Calculation (min): 30 min And Today's Date: 02/16/2023 PT Missed Time: 75 Minutes Missed Time Reason: Other (Comment) (Pt refusal due to SOB/chest pain)   Short Term Goals: Week 1:  PT Short Term Goal 1 (Week 1): Patient will initiate stair mobility PT Short Term Goal 2 (Week 1): Patient will ambulate >50' with LRAD and MinA PT Short Term Goal 3 (Week 1): Patient will tolerate staying out of the bed between therapy sessions  SESSION 1 Skilled Therapeutic Interventions/Progress Updates:   Attempted to see patient at 0830 for PT. Patient reported having SOB (currently on 2L), chest pain and increased anxiety earlier in the morning. Attending physician provided nitroglycerin prior to arrival. PTA alerted attending nurse of patient's discomfort and that the patient asked for pain medication/anti-anxiety medication. Patient denied participation in PT due to increased pain and discomfort despite encouragement, and that she wanted to rest until the medication kicks in. Patient made aware of having to make up missed minutes at a later time.   SESSION 2 Skilled Therapeutic Interventions/Progress Updates: Patient in [facility] on entrance to room. Patient in better spirits, alert and agreeable to PT session.   Patient reported some discomfort, but still able/wanting to participate in PT. Patient present with nasal canula (2L).   Therapeutic Activity: Bed Mobility: Pt performed sit to supine from EOB with supervision for safety.  Transfers: Pt performed sit<>stand/pivot transfers throughout session with RW and CGA/light minA for truncal elevation per presentation of requiring increased time/effort to extend through hips. Provided VC for use of L UE to reach back to control descent.  Gait Training:  Pt ambulated 15' x 1, and  10' x 1 using RW with PTA managing WC follow and O2 tank. Patient presented with forward flexed posture and unsafe proximity from RW. VC provided for patient to stand close to RW as it will help with forward flexed posture, after which patient required a rest break between the two trials due to reports of increased pain in B U from having increased WB. PTA provided demonstration of safe RW management and that staying close to it will decrease WB (and potentially pain) in B UE's. Patient then ambulated 10' with safer management of RW, and then required rest break. Patient's O2 monitored after 1st gait trial and was found to be WNL (see vitals). Patient dependently transported back to room due to patient reporting increased fatigue.  Patient supine in bed at end of session with brakes locked, bed alarm set, and all needs within reach.      Therapy Documentation Precautions:  Precautions Precautions: Fall Precaution Comments: Monitor SpO2, L knee pain Restrictions Weight Bearing Restrictions: No  Therapy/Group: Individual Therapy  Shayanna Thatch PTA 02/16/2023, 12:38 PM

## 2023-02-16 NOTE — Progress Notes (Signed)
PROGRESS NOTE   Subjective/Complaints:  PRN nitroglycerin at 7 am this morning Hard BM overnight PVRs high, 1x ISC overnight for 900.  On evaluation in room this a.m., patient complains of chest pain radiating into her back.  She says it was somewhat helped with as needed nitroglycerin.  Is reproducible on palpation in the front and back.  She endorses shortness of breath, difficulty catching her breath, reassured patient that vitals and clinical exam were stable and no medical concerns at this time.  She does endorse a significant bit of anxiety feeding into her symptoms, and states she has not gotten any medications yet this a.m.  Checked on patient again after as needed Xanax given, she states she is feeling better.  ROS:  + Chest pain/angina - intermittent, intermittently reproducible on palpation, responsive to nitroglycerin and Xanax + abdominal cramping/constipation - resolved + generalized weakness - ongoing + Urinary retention - ?  Present prior to admission-ongoing Denies fevers, chills, N/V, abdominal pain, diarrhea, cough, chest pain, new weakness or paraesthesias.   As per HPI  Objective:   No results found. Recent Labs    02/14/23 0454 02/15/23 1041  WBC 8.4 9.1  HGB 11.8* 11.1*  HCT 36.8 34.7*  PLT 242 232    Recent Labs    02/14/23 0454 02/15/23 1041  NA 131* 136  K 4.2 4.3  CL 95* 97*  CO2 24 28  GLUCOSE 151* 145*  BUN 72* 67*  CREATININE 1.55* 1.36*  CALCIUM 8.9 9.2     Intake/Output Summary (Last 24 hours) at 02/16/2023 0837 Last data filed at 02/16/2023 0815 Gross per 24 hour  Intake 748 ml  Output 1879 ml  Net -1131 ml         Physical Exam: Vital Signs Blood pressure 132/73, pulse (!) 101, temperature 98.2 F (36.8 C), resp. rate 18, height 5\' 6"  (1.676 m), weight 76.8 kg, SpO2 97 %. Physical Exam Constitutional: No apparent distress. Appropriate appearance for age. +Obese.   Laying in bed, mildly distressed. HENT: No JVD. Neck Supple. Trachea midline. Atraumatic, normocephalic. Eyes: PERRLA. EOMI. Visual fields grossly intact.  Cardiovascular: Mild tachycardia, no murmurs/rub/gallops. No Edema. Peripheral pulses 2+  Respiratory: CTAB.  No rales, rhonchi, or wheezing.  Respiratory rate 20.  + 2 L [place] Abdomen: + Large hernia/diastasis, normoactive BS throughout.  Skin: C/D/I. No apparent lesions. Chronic skin changes on B/L dorsal feet.  MSK:          Strength: antigravity and against resistance all 4 extremities; 4/5   + TTP over left chest, sternum; +TTP over mid thoracic paraspinals Neurologic exam:  Cognition: AAO to person, place, time and event.  Insight: Good  insight into current condition.  Sensation: To light touch intact in BL UEs and LEs  Reflexes: 2+ in BL UE and LEs. CN: 2-12 grossly intact.    Assessment/Plan: 1. Functional deficits which require 3+ hours per day of interdisciplinary therapy in a comprehensive inpatient rehab setting. Physiatrist is providing close team supervision and 24 hour management of active medical problems listed below. Physiatrist and rehab team continue to assess barriers to discharge/monitor patient progress toward functional and medical goals  Care Tool:  Bathing  Bathing activity did not occur: Refused           Bathing assist       Upper Body Dressing/Undressing Upper body dressing   What is the patient wearing?: Dress    Upper body assist Assist Level: Supervision/Verbal cueing    Lower Body Dressing/Undressing Lower body dressing      What is the patient wearing?: Underwear/pull up     Lower body assist Assist for lower body dressing: Moderate Assistance - Patient 50 - 74%     Toileting Toileting    Toileting assist Assist for toileting: Moderate Assistance - Patient 50 - 74%     Transfers Chair/bed transfer  Transfers assist     Chair/bed transfer assist level: Minimal  Assistance - Patient > 75%     Locomotion Ambulation   Ambulation assist      Assist level: Minimal Assistance - Patient > 75% Assistive device: Walker-rolling Max distance: 10'   Walk 10 feet activity   Assist     Assist level: Minimal Assistance - Patient > 75% Assistive device: Walker-rolling   Walk 50 feet activity   Assist Walk 50 feet with 2 turns activity did not occur: Safety/medical concerns (Unable to ambulate >10' or perform stair mobility secondary to global deconditioning)         Walk 150 feet activity   Assist Walk 150 feet activity did not occur: Safety/medical concerns         Walk 10 feet on uneven surface  activity   Assist Walk 10 feet on uneven surfaces activity did not occur: Safety/medical concerns         Wheelchair     Assist Is the patient using a wheelchair?: Yes Type of Wheelchair: Manual Wheelchair activity did not occur:  (Therapist dependently propelling manual wheelchair secondary to fatigue)  Wheelchair assist level: Dependent - Patient 0% Max wheelchair distance: 150    Wheelchair 50 feet with 2 turns activity    Assist        Assist Level: Dependent - Patient 0%   Wheelchair 150 feet activity     Assist      Assist Level: Dependent - Patient 0%   Blood pressure 132/73, pulse (!) 101, temperature 98.2 F (36.8 C), resp. rate 18, height 5\' 6"  (1.676 m), weight 76.8 kg, SpO2 97 %.  Medical Problem List and Plan: 1. Functional deficits secondary to debility/CHF/acute hypoxic respiratory failure             -patient may shower             -ELOS/Goals: 10-14 days, SPV goals PT/OT - 6/28 - 6/14: D/w family, SW plan for discharge to hospice nursing after IPR; no referrals sent while in acute. Family working with Palliative for OP referral; will let us know if they need anything.   - 6/17: RR overnight for chest pain, Sob/tachypnea. Troponins stable 30s, EKG no STEMI. Mild weight increase, mild  vascular congestion on CXR improved from last exam. Give lasix 20 mg x1, consult HF team. Responsive to PRN nitroglycerin.  - 6/18: Per son, will need to be intermittent SPV at home with neighbors; patient will be paying caregiver every other day and having her call on off days. Per therapies, self-limiting but is improving. With PT today CGA 13 ft, stair and car transfer goals at SPV and all otehrs Mod I goals.   2.  Antithrombotics: -DVT/anticoagulation:  Pharmaceutical: Lovenox 40mg  QD             -  antiplatelet therapy: Aspirin 81 mg daily 3. Pain Management: Tramadol as needed 4. Mood/Behavior/Sleep: Melatonin 5 mg nightly--sleeping well, cont regimen             -antipsychotic agents: N/A  - 6/18: Start Buspar 7.5 mg BID for anxiety, Xanax 0.25 mg BID PRN for severe episodes while uptitrating - no xanax needed overnight, did give 1 time this a.m. for reproducible chest pain and shortness of breath, with improvement in symptoms.  5. Neuropsych/cognition: This patient is capable of making decisions on her own behalf. 6. Skin/Wound Care: Routine skin checks 7. Fluids/Electrolytes/Nutrition: Routine in and outs with follow-up chemistries - 6/14: Na low-stable 133 this AM; K 3.2 add K dur 20 meq BID x3 days; BUN/Cr stable 1.2-1.5 -02/12/23 Na stable at 132, K 4.1, BUN/Cr improving, will decrease Kdur to QD x2 more days for now and recheck on routine labs on Monday  - 6/17: Na stable, BUN increasing, Cr stable, K WNL - 6/18: Na stable, K WNL  8.  COPD exacerbation/tobacco use.  Continue nebulizers as advised.  Check oxygen saturations every shift. Tobacco cessation education; does not want patches.    9.  Hypertension.  Lasix 40 mg twice daily, Imdur 60 mg daily, Cozaar 12.5 mg daily, Hydralazine 10 mg every 8 hours, Toprol-XL 25 mg daily.  Monitor with increased mobility - 6/14: Mildly low today; monitor with diuresis, if symptomatic may need cards to assist in adjusting -6/15-16/24 BPs  uptrending, monitor for now - 6/17-18: BP stable/low   Vitals:   02/15/23 0236 02/15/23 0238 02/15/23 0253 02/15/23 0308  BP: 109/65 109/65 106/62 124/63   02/15/23 0805 02/15/23 1300 02/15/23 1328 02/15/23 1942  BP: 139/66 (!) 108/59 108/63 125/65   02/15/23 2142 02/16/23 0427 02/16/23 0548 02/16/23 0713  BP: (!) 103/50 129/68 122/72 132/73     10.  Diastolic congestive heart failure/intermittent chest pain.  Ejection fraction 20 to 25%.  Continue diuresis.  Jardiance 25 mg daily.  Follow-up heart failure team. Daily weights.    - 6/14: Likely decreased d/t bed vs standing, monitor trend  -6/15-16/24 wt stable , cont monitoring  - 6/17: Lasix 20 mg once today as below. Per HF team recs: "May be demand ischemia with CHF though wt has continued to decrease.  EKG without acute changes.  Not on tele in rehab  - could consider other CAD beside LAD on imdur 60 - could add ranexa 500 BID or increase imdur to 90 mg daily, will give another 30 mg po now then 90 daily"  - 6/18: 1x NTG this AM for CP; appreciate HF team recs, will start anxiety treatment today as well as above.   - 6/19" 1x NTG overnight, vitals stable  Filed Weights   02/12/23 0445 02/13/23 0700 02/14/23 1600  Weight: 78.8 kg 79.4 kg 76.8 kg      11.  AKI on CKD/hyponatremia.  Follow-up chemistries.  Entresto on hold until renal function improved. - Cr stable 1.5; has been 1.2-1.5; monitor BMP in AM d/t low BP and significant diuresis -02/12/23 Cr stable 1.38; monitor routine labs - 6/17: Cr 1.5; stable. BUN uptrending. Recheck in AM   - 6/18: Cr 1.3, BUN 67; improved  12.  Hyperlipidemia.  Lipitor 80mg  QD   13.  Type II diabetes mellitus.  Hemoglobin A1c 6.0.   - 6/14: BG stable, continue SSI and Jardiance 25mg  every day  - 6/15-18 CBGs stable, monitor Recent Labs    02/15/23 1658 02/15/23 2113 02/16/23 0865  GLUCAP 146* 163* 177*       14.  Constipation. Failed enema today. Passing gas. Check KUB - mild gaseous  distention, small volume stool.  Adjust bowel program - Sennakot S 2 tabs BID, PRN sorbitol.   - 6/14: Small BM and smear yesterday. 1x sorbitol. Add Miralax daily to regimen. Add PRN suppository -02/12/23 LBM this morning, monitor' -02/13/23 multiple BMs overnight, reportedly black/dark colored; pt asymptomatic. H&H showing slight decrease in Hgb 11.7 (previously 12.3-12.8); will recheck H&H at 6hr mark, get fecal occult card done, and monitor closely; if continuing to have melanotic stools or worsening anemia, may need to consult GI/start protonix/stop lovenox; will monitor.  - 6/17: + occult blood x2, HgB stable, start protonix BID - 6/19: Increase miralax to BID, hard stool 6/18  15. Urinary retention. High PVR 700 yesterday. Check x3 days, start Flomax 0.4 mg at bedtime.  - 6/19: High retention overnight, ISCx1. Increase flomax to 0.8 mg, timed toiletting at bedtime Q4H. UA ordered.    LOS: 6 days A FACE TO FACE EVALUATION WAS PERFORMED  Angela Lucero 02/16/2023, 8:37 AM

## 2023-02-16 NOTE — Progress Notes (Signed)
Occupational Therapy Session Note  Patient Details  Name: Angela Lucero MRN: [id number] Date of Birth: [birth year]  Today's Date: 02/16/2023 OT Individual Time: 1005-1055 OT Individual Time Calculation (min): 50 min  and Today's Date: 02/16/2023 OT Missed Time: 10 Minutes Missed Time Reason: Other (comment) (Delay in service.)   Short Term Goals: Week 1:  OT Short Term Goal 1 (Week 1): Pt will complete LB dressing with CGA OT Short Term Goal 2 (Week 1): Pt wil complete toileting with Min A OT Short Term Goal 3 (Week 1): Pt will complete LB bathing with CGA  Skilled Therapeutic Interventions/Progress Updates:  Pt received resting in bed for skilled OT session with focus on OOB activity, toileting, and general standing/activity tolerance. Pt agreeable to interventions, demonstrating overall pleasant mood. Pt with un-rated pain in lower back. OT offering intermediate rest breaks and positioning suggestions throughout session to address pain/fatigue and maximize participation/safety in session.   Pt comes to EOB with supervision + HOB elevated, sitting at EOB to don shoes with item-retrieval. Pt performs stand-step transfer from EOB>WC, and all STS transfers with close supervision + RW. Pt requires brief change, threading and performing peri-care with setup, requiring A for dress management. Pt dependent for WC transport from room<>day room for time/energy management.   In day room, pt participates table-top activity targeting standing/activity tolerance. Pt stands for 3-4 mins x2 rounds, requiring extended rest-breaks to recover. Pt able to progress to unsupported standing, but observed resting arm on table with increased fatigue.   Pt remained sitting in [facility] with all immediate needs met at end of session. Pt continues to be appropriate for skilled OT intervention to promote further functional independence.   Therapy Documentation Precautions:  Precautions Precautions: Fall Precaution Comments:  Monitor SpO2, L knee pain Restrictions Weight Bearing Restrictions: No   Therapy/Group: Individual Therapy  Lou Cal, OTR/L, MSOT  02/16/2023, 6:20 AM

## 2023-02-16 NOTE — Progress Notes (Signed)
Physical Therapy Session Note  Patient Details  Name: Angela Lucero MRN: [id number] Date of Birth: 19-Nov-1940  Today's Date: 02/16/2023 PT Individual Time: 1400-1449 PT Individual Time Calculation (min): 49 min   Short Term Goals: Week 1:  PT Short Term Goal 1 (Week 1): Patient will initiate stair mobility PT Short Term Goal 2 (Week 1): Patient will ambulate >50' with LRAD and MinA PT Short Term Goal 3 (Week 1): Patient will tolerate staying out of the bed between therapy sessions  Skilled Therapeutic Interventions/Progress Updates:  Patient supine in bed on entrance to room. Patient alert and agreeable to PT session. Relates improved overall mood/ motivation and willing to participate this afternoon despite understanding that breathing will be taxed.   Patient with no pain complaint at start of session. Slight apprehension with initiating therapy  but no resistance in participation. SpO2 checked throughout session with readings between 92-96%. Maintained on 2L via [place].   Therapeutic Activity: Bed Mobility: Pt performed supine --> sit with supervision. No cueing provided for technique. At end of session, pt is able to return to supine with increased time and supervision.  Transfers: Pt performed sit<>stand and stand pivot transfers throughout session with close supervision with vc for improved hand positioning. Is encouraged to perform to no AD with pt relating reduced confidence. However is able to perform with no decrease in balance.  Pt guided in standing activity with no UE support of placing red, green, and blue clothes pins on netting of basketball hoop placed overhead in order to encourage large arm movements for increased cardiorespiratory load to progress activity tolerance and pathophysiology. Is able to perform 2 bouts of placing/ removing pins prior to relation of fatigue and increased weakness. Resp rate up to 24 breaths/ min following first bout and then up to 28 breaths/ min in second  bout. Requires several minutes of rest for return to 20 breaths/ min.   Short distance ambulatory transfer to return to bed from w/c with no AD but RUE HHA with decreased step length and overall CGA. Patient supine at end of session with brakes locked, bed alarm set, and all needs within reach.   Therapy Documentation Precautions:  Precautions Precautions: Fall Precaution Comments: Monitor SpO2, L knee pain Restrictions Weight Bearing Restrictions: No General: PT Amount of Missed Time (min): 75 Minutes PT Missed Treatment Reason: Other (Comment) (Pt refusal due to SOB/chest pain) Vital Signs:  Pain:  Weakness/ fatigue > pain in knee to end session. Pain unrated and improved with positioning in supine.   Therapy/Group: Individual Therapy  Loel Dubonnet PT, DPT, CSRS 02/16/2023, 5:23 PM

## 2023-02-17 ENCOUNTER — Inpatient Hospital Stay: Payer: Medicare Other | Admitting: Student

## 2023-02-17 LAB — GLUCOSE, CAPILLARY
Glucose-Capillary: 165 mg/dL — ABNORMAL HIGH (ref 70–99)
Glucose-Capillary: 158 mg/dL — ABNORMAL HIGH (ref 70–99)
Glucose-Capillary: 118 mg/dL — ABNORMAL HIGH (ref 70–99)
Glucose-Capillary: 202 mg/dL — ABNORMAL HIGH (ref 70–99)

## 2023-02-17 MED ORDER — BUSPIRONE HCL 15 MG PO TABS
7.5000 mg | ORAL_TABLET | Freq: Three times a day (TID) | ORAL | Status: DC
  Administered 2023-02-17 – 2023-02-25 (×24): 7.5 mg via ORAL
  Filled 2023-02-17 (×24): qty 1

## 2023-02-17 MED ORDER — TAMSULOSIN HCL 0.4 MG PO CAPS
0.4000 mg | ORAL_CAPSULE | Freq: Every day | ORAL | Status: DC
  Administered 2023-02-17 – 2023-02-24 (×8): 0.4 mg via ORAL
  Filled 2023-02-17 (×8): qty 1

## 2023-02-17 NOTE — Progress Notes (Signed)
Occupational Therapy Note  Patient Details  Name: Angela Lucero MRN: [id number] Date of Birth: 03-18-1941  Today's Date: 02/17/2023 OT Missed Time: 75 Minutes Missed Time Reason: Patient fatigue (Shortness of breath)  Patient greeted semi-reclined in bed. Pt reported fatigue, low energy and shortness of breath. Pt stated she felt like a "Gorilla" was sitting on her chest and she could not catch her breath. Pt declined any activity at this time despite encouragement. Will follow up per plan of care.    Merlene Laughter Gaddiel Cullens 02/17/2023, 8:07 AM

## 2023-02-17 NOTE — Evaluation (Signed)
Recreational Therapy Assessment and Plan  Patient Details  Name: Angela Lucero MRN: [id number] Date of Birth: 06-04-1941 Today's Date: 02/17/2023  Rehab Potential:  Good ELOS:   d/c 6/28  Assessment Hospital Problem: Principal Problem:   Debility     Past Medical History:      Past Medical History:  Diagnosis Date   Arthritis 2010    Lower back and neck   CAD (coronary artery disease)     COPD (chronic obstructive pulmonary disease) (HCC)     CVA (cerebral vascular accident) (HCC)      1997, 2005 stents   Diabetes (HCC)     Diabetes mellitus, type II (HCC)     Gout 1998   Heart attack (HCC)      4098,1191   Hyperlipidemia     Hypertension     Hypertriglyceridemia      Past Surgical History:       Past Surgical History:  Procedure Laterality Date   APPENDECTOMY   1971   CHOLECYSTECTOMY       LEFT HEART CATH AND CORONARY ANGIOGRAPHY N/A 12/14/2021    Procedure: LEFT HEART CATH AND CORONARY ANGIOGRAPHY;  Surgeon: Yvonne Kendall, MD;  Location: [facility];  Service: Cardiovascular;  Laterality: N/A;   Navel hernia repair   2007   PARTIAL HYSTERECTOMY   1976   Stent Heart       TUBAL LIGATION   1974   Ulna nerve transpost L arm          Assessment & Plan Clinical Impression: Patient is a 82 year old right handed female with H/O CAD with left heart cath 12/14/2021,, diastolic congestive heart failure with ejection fraction of 30 to 35%, COPD/tobacco use maintained on aspirin,CVA 1997 without residual deficits , CKD stage III with creatinine 1.32-1.60 HTN,HLD, type diabetes mellitus. Chart review patient lives alone.  1 level home 3 steps to entry.  Independent prior to admission.  Presented 02/04/2023 with  progressive shortness of breath as well as sharp acute chest pain.  Admission chemistries BNP 3951, troponin 147-136, sodium 122, chloride 88, glucose 43, BUN 45, creatinine 1.89, lactic acid within normal limits, globin A1c 6.0.  EKG did not show any evidence of  acute ischemia.  Patient did receive 2 nitroglycerin tablets and chest pain subsided.  Chest x-ray hazy bibasilar airspace opacities representing atelectasis and/or infection.  Echo with ejection fraction of 20 to 25%.  Global hypokinesis..  Cardiology consulted for acute on chronic systolic congestive heart failure with conservative care and presently maintained on Jardiance as well as Lasix 40 mg twice daily as per heart failure team.  Sherryll Burger remains on hold for AKI considering restarting if creatinine improves.  Lovenox has been added for DVT prophylaxis.  Patient initially and oxygen demand and slowly has been weaned.  Mild elevation in creatinine to 1.81 felt to be related to diuresis and monitor closely.  Hyponatremia 122-126.  Palliative care consulted to establish goals of care.  Patient with reported constipation.  Therapy evaluations completed due to patient debility and decreased functional mobility was admitted for a comprehensive rehab program.  Pt presents with decreased activity tolerance, decreased functional mobility, decreased oxygen support Limiting pt's independence with leisure/community pursuits.  Met with pt today to discuss TR services including leisure education, activity analysis/modifications and stress management.  Also discussed the importance of social, emotional, spiritual health in addition to physical health and their effects on overall health and wellness.  Pt stated understanding.  Pt stating  difficulty tolerating therapy schedule, says her brain says she can do it as scheduled but her body can't keep up/do it.  Team requesting modification of schedule to 15/7.  Education provided on healthy coping strategies as pt stated she smoked cigarettes for stress relief for many years.  Pt was able to verbalize other strategies to assist in stress management with min cues.  No further TR at this time as pt has low activity tolerance.  Will continue to monitor through team.  Plan  No  further TR at this time.  Recommendations for other services: None   Discharge Criteria: Patient will be discharged from TR if patient refuses treatment 3 consecutive times without medical reason.  If treatment goals not met, if there is a change in medical status, if patient makes no progress towards goals or if patient is discharged from hospital.  The above assessment, treatment plan, treatment alternatives and goals were discussed and mutually agreed upon: by patient  [name] 02/17/2023, 12:00 PM

## 2023-02-17 NOTE — Progress Notes (Signed)
PROGRESS NOTE   Subjective/Complaints: Prn nitroglycerin and xanax this AM for feeling of "gorilla" sitting on chest and SOB. Vitals stable, on 2 L [place]. By provider assessment, no further chest pain but feeling "exhausted". Missed OT this morning, placed on 15/7.   She states prior to admission, she was independent all ADLs/IADLs in her trailer and had no SOB or difficulty with ambulation.   ISC x3 yesterday  ROS:  + Chest pain/angina - intermittent, intermittently reproducible on palpation, responsive to nitroglycerin and Xanax + abdominal cramping/constipation - resolved + generalized weakness - ongoing + Urinary retention -ongoing Denies fevers, chills, N/V, abdominal pain, diarrhea, cough, chest pain, new weakness or paraesthesias.   As per HPI  Objective:   No results found. Recent Labs    02/15/23 1041  WBC 9.1  HGB 11.1*  HCT 34.7*  PLT 232    Recent Labs    02/15/23 1041  NA 136  K 4.3  CL 97*  CO2 28  GLUCOSE 145*  BUN 67*  CREATININE 1.36*  CALCIUM 9.2     Intake/Output Summary (Last 24 hours) at 02/17/2023 0836 Last data filed at 02/17/2023 0981 Gross per 24 hour  Intake 680 ml  Output 934 ml  Net -254 ml         Physical Exam: Vital Signs Blood pressure 127/67, pulse 92, temperature 97.7 F (36.5 C), resp. rate 20, height 5\' 6"  (1.676 m), weight 77.5 kg, SpO2 98 %. Physical Exam Constitutional: No apparent distress. Appropriate appearance for age. +Obese. Sitting in WC in gym.  HENT: No JVD. Neck Supple. Trachea midline. Atraumatic, normocephalic. Eyes: PERRLA. EOMI. Visual fields grossly intact.  Cardiovascular: RRR, 1-2 irregular beats but overall regular rhythm, no murmurs/rub/gallops. No Edema. Peripheral pulses 2+  Respiratory: CTAB.  No rales, rhonchi, or wheezing.  + 2 L [place] Abdomen: + Large hernia/diastasis, normoactive BS throughout.  Skin: C/D/I. No apparent lesions. Chronic  skin changes on B/L dorsal feet. +BL UE bruising.  MSK:          Strength: antigravity and against resistance all 4 extremities; 4/5   No further TTP over left chest, sternum  Neurologic exam:  Cognition: AAO to person, place, time and event.  Insight: Good  insight into current condition.  Sensation: To light touch intact in BL UEs and LEs  Reflexes: 2+ in BL UE and LEs. CN: 2-12 grossly intact.    Assessment/Plan: 1. Functional deficits which require 3+ hours per day of interdisciplinary therapy in a comprehensive inpatient rehab setting. Physiatrist is providing close team supervision and 24 hour management of active medical problems listed below. Physiatrist and rehab team continue to assess barriers to discharge/monitor patient progress toward functional and medical goals  Care Tool:  Bathing  Bathing activity did not occur: Refused           Bathing assist       Upper Body Dressing/Undressing Upper body dressing   What is the patient wearing?: Dress    Upper body assist Assist Level: Supervision/Verbal cueing    Lower Body Dressing/Undressing Lower body dressing      What is the patient wearing?: Underwear/pull up  Lower body assist Assist for lower body dressing: Moderate Assistance - Patient 50 - 74%     Toileting Toileting    Toileting assist Assist for toileting: Moderate Assistance - Patient 50 - 74%     Transfers Chair/bed transfer  Transfers assist     Chair/bed transfer assist level: Minimal Assistance - Patient > 75%     Locomotion Ambulation   Ambulation assist      Assist level: Minimal Assistance - Patient > 75% Assistive device: Walker-rolling Max distance: 10'   Walk 10 feet activity   Assist     Assist level: Minimal Assistance - Patient > 75% Assistive device: Walker-rolling   Walk 50 feet activity   Assist Walk 50 feet with 2 turns activity did not occur: Safety/medical concerns (Unable to ambulate >10' or  perform stair mobility secondary to global deconditioning)         Walk 150 feet activity   Assist Walk 150 feet activity did not occur: Safety/medical concerns         Walk 10 feet on uneven surface  activity   Assist Walk 10 feet on uneven surfaces activity did not occur: Safety/medical concerns         Wheelchair     Assist Is the patient using a wheelchair?: Yes Type of Wheelchair: Manual Wheelchair activity did not occur:  (Therapist dependently propelling manual wheelchair secondary to fatigue)  Wheelchair assist level: Dependent - Patient 0% Max wheelchair distance: 150    Wheelchair 50 feet with 2 turns activity    Assist        Assist Level: Dependent - Patient 0%   Wheelchair 150 feet activity     Assist      Assist Level: Dependent - Patient 0%   Blood pressure 127/67, pulse 92, temperature 97.7 F (36.5 C), resp. rate 20, height 5\' 6"  (1.676 m), weight 77.5 kg, SpO2 98 %.  Medical Problem List and Plan: 1. Functional deficits secondary to debility/CHF/acute hypoxic respiratory failure             -patient may shower             -ELOS/Goals: 10-14 days, SPV goals PT/OT - 6/28 - 6/14: D/w family, SW plan for discharge to hospice nursing after IPR; no referrals sent while in acute. Family working with Palliative for OP referral; will let us know if they need anything.   - 6/17: RR overnight for chest pain, Sob/tachypnea. Troponins stable 30s, EKG no STEMI. Mild weight increase, mild vascular congestion on CXR improved from last exam. Give lasix 20 mg x1, consult HF team. Responsive to PRN nitroglycerin.  - 6/18: Per son, will need to be intermittent SPV at home with neighbors; patient will be paying caregiver every other day and having her call on off days. Per therapies, self-limiting but is improving. With PT today CGA 13 ft, stair and car transfer goals at SPV and all otehrs Mod I goals.  - 6/20: Make 15/7 for recurrent CP, SOB  2.   Antithrombotics: -DVT/anticoagulation:  Pharmaceutical: Lovenox 40mg  QD             -antiplatelet therapy: Aspirin 81 mg daily 3. Pain Management: Tramadol as needed 4. Mood/Behavior/Sleep: Melatonin 5 mg nightly--sleeping well, cont regimen             -antipsychotic agents: N/A  - 6/18: Start Buspar 7.5 mg BID for anxiety, Xanax 0.25 mg BID PRN for severe episodes while uptitrating  - 6/20: responsive to  PRN xanax, ongoing anxiety/recurrent CP/SOB, increase Buspar to 7.5 mg TID  5. Neuropsych/cognition: This patient is capable of making decisions on her own behalf. 6. Skin/Wound Care: Routine skin checks 7. Fluids/Electrolytes/Nutrition: Routine in and outs with follow-up chemistries - 6/14: Na low-stable 133 this AM; K 3.2 add K dur 20 meq BID x3 days; BUN/Cr stable 1.2-1.5 -02/12/23 Na stable at 132, K 4.1, BUN/Cr improving, will decrease Kdur to QD x2 more days for now and recheck on routine labs on Monday  - 6/17: Na stable, BUN increasing, Cr stable, K WNL - 6/18: Na stable, K WNL  8.  COPD exacerbation/tobacco use.  Continue nebulizers as advised.  Check oxygen saturations every shift. Tobacco cessation education; does not want patches.    9.  Hypertension.  Lasix 40 mg twice daily, Imdur 60 mg daily, Cozaar 12.5 mg daily, Hydralazine 10 mg every 8 hours, Toprol-XL 25 mg daily.  Monitor with increased mobility - 6/14: Mildly low today; monitor with diuresis, if symptomatic may need cards to assist in adjusting -6/15-16/24 BPs uptrending, monitor for now - 6/17-20: BP stable/low - may be too low with flomax 0.8 mg. Reduce to 0.4 mg; monitor for orthostasis   Vitals:   02/15/23 0805 02/15/23 1300 02/15/23 1328 02/15/23 1942  BP: 139/66 (!) 108/59 108/63 125/65   02/15/23 2142 02/16/23 0427 02/16/23 0548 02/16/23 0713  BP: (!) 103/50 129/68 122/72 132/73   02/16/23 1304 02/16/23 1934 02/16/23 2234 02/17/23 0535  BP: (!) 92/55 119/64 (!) 96/56 127/67     10.  Diastolic congestive  heart failure/intermittent chest pain.  Ejection fraction 20 to 25%.  Continue diuresis.  Jardiance 25 mg daily.  Follow-up heart failure team. Daily weights.    - 6/14: Likely decreased d/t bed vs standing, monitor trend  -6/15-16/24 wt stable , cont monitoring  - 6/17: Lasix 20 mg once today as below. Per HF team recs: "May be demand ischemia with CHF though wt has continued to decrease.  EKG without acute changes.  Not on tele in rehab  - could consider other CAD beside LAD on imdur 60 - could add ranexa 500 BID or increase imdur to 90 mg daily, will give another 30 mg po now then 90 daily"  - 6/18-20: 1-2x daily NTG for CP; weights stable; anxiety component as above  Filed Weights   02/14/23 1600 02/16/23 0920 02/17/23 0500  Weight: 76.8 kg 77.5 kg 77.5 kg      11.  AKI on CKD/hyponatremia.  Follow-up chemistries.  Entresto on hold until renal function improved. - Cr stable 1.5; has been 1.2-1.5; monitor BMP in AM d/t low BP and significant diuresis -02/12/23 Cr stable 1.38; monitor routine labs - 6/17: Cr 1.5; stable. BUN uptrending. Recheck in AM   - 6/18: Cr 1.3, BUN 67; improved  12.  Hyperlipidemia.  Lipitor 80mg  QD   13.  Type II diabetes mellitus.  Hemoglobin A1c 6.0.   - 6/14: BG stable, continue SSI and Jardiance 25mg  every day  - 6/15-18 CBGs stable, monitor Recent Labs    02/16/23 1713 02/16/23 2114 02/17/23 0546  GLUCAP 111* 152* 165*       14.  Constipation. Failed enema today. Passing gas. Check KUB - mild gaseous distention, small volume stool.  Adjust bowel program - Sennakot S 2 tabs BID, PRN sorbitol.   - 6/14: Small BM and smear yesterday. 1x sorbitol. Add Miralax daily to regimen. Add PRN suppository -02/12/23 LBM this morning, monitor' -02/13/23 multiple BMs overnight,  reportedly black/dark colored; pt asymptomatic. H&H showing slight decrease in Hgb 11.7 (previously 12.3-12.8); will recheck H&H at 6hr mark, get fecal occult card done, and monitor closely; if  continuing to have melanotic stools or worsening anemia, may need to consult GI/start protonix/stop lovenox; will monitor.  - 6/17: + occult blood x2, HgB stable, start protonix BID - 6/19: Increase miralax to BID, hard stool 6/18 - 6/19 large BM  15. Urinary retention. High PVR 700 yesterday. Check x3 days, start Flomax 0.4 mg at bedtime.  - 6/19: High retention overnight, ISCx1. Increase flomax to 0.8 mg, timed toiletting at bedtime Q4H. UA ordered. -> reduced flomax d/t hypotension  - 6/20: UA with rare bacteria, small LE, no Nitrites; given no apparent symptoms other than retention will not treat. Bethenachol contraindicated d/t CAD   LOS: 7 days A FACE TO FACE EVALUATION WAS PERFORMED  Angelina Sheriff 02/17/2023, 8:36 AM

## 2023-02-17 NOTE — Plan of Care (Signed)
Patient's plan of care adjusted to 15/7 after speaking with care team and discussed with Dr. Shearon Stalls, the patient currently unable to tolerate current therapy schedule with OT and PT.

## 2023-02-17 NOTE — Progress Notes (Signed)
Physical Therapy Session Note  Patient Details  Name: Angela Lucero MRN: [id number] Date of Birth: [birth year]  Today's Date: 02/17/2023 PT Individual Time: 0916-1014 PT Individual Time Calculation (min): 58 min   Short Term Goals: Week 1:  PT Short Term Goal 1 (Week 1): Patient will initiate stair mobility PT Short Term Goal 2 (Week 1): Patient will ambulate >50' with LRAD and MinA PT Short Term Goal 3 (Week 1): Patient will tolerate staying out of the bed between therapy sessions  Skilled Therapeutic Interventions/Progress Updates:     Pt received sidelying in bed and agrees to therapy, though states that she has been feeling short of breath all morning. Pt says earlier this AM she was 9/10 on shortness of breath scale and is now 7/10. No complaint of pain. PT provides rest breaks as needed and cues for pursed lip breathing to optimize oxygen sats and symptoms. Pt performs sidelying to supine to sit with bed features and cues for positioning. Pt performs stand step transfer to [facility] with CGA and cues for positioning. WC transport to gym. Pt's oxygen sats checked with pt at 99% O2 on 2L/min nasal cannula. HR at 94 BPM. Pt stands and ambulates x13' with RW and close WC follow, but no physical assistance required. PT provides verbal cues to decrease WB through RW for energy conservation, as well as upright posture to improve body mechanics. Following extended seated rest break, pt ambulates additional x20' with RW and same cues and assistance. Pt left seated in WC with alarm intact and all needs within reach.   Therapy Documentation Precautions:  Precautions Precautions: Fall Precaution Comments: Monitor SpO2, L knee pain Restrictions Weight Bearing Restrictions: No   Therapy/Group: Individual Therapy  Beau Fanny, PT, DPT 02/17/2023, 4:57 PM

## 2023-02-17 NOTE — Progress Notes (Signed)
Physical Therapy Session Note  Patient Details  Name: Angela Lucero MRN: [id number] Date of Birth: [birth year]  Today's Date: 02/17/2023 PT Individual Time: 1415-1540 PT Individual Time Calculation (min): 85 min   Short Term Goals: Week 1:  PT Short Term Goal 1 (Week 1): Patient will initiate stair mobility PT Short Term Goal 2 (Week 1): Patient will ambulate >50' with LRAD and MinA PT Short Term Goal 3 (Week 1): Patient will tolerate staying out of the bed between therapy sessions  Skilled Therapeutic Interventions/Progress Updates:  Patient greeted supine in bed and agreeable to PT treatment session. Patient transitioned to sitting EOB with supv- While sitting EOB, patient donned shoes with set-up assistance. Patient stood from EOB and performed stand pivot transfer to wheelchair with RW and SBA for safety. Patient remained on 2L oxygen throughout treatment session via [place]- Therapist managed tank and line throughout treatment session. Patient wheeled to/from her room and day room via wheelchair for time management and energy conservation.  Patient participated in dance activity in the day room in order to improve overall affect/mood, endurance/activity tolerance, interaction with other patients, coordination, strength, etc. Patient danced to the beat of various songs with BUE/LE while also following along to instructions. Patient required VC for pursed lip breathing and energy conservation throughout dance activity.   Patient returned to her room and performed sit/stand from wheelchair with RW and SBA- Patient then gait trained form her wc to EOB with RW and CGA. Patient transitioned to supine with supv, however required increased time to complete. Patient left supine in bed with bed alarm on, call bell within reach and all needs met. Patient re-connected to 2L oxygen via the wall port.    Therapy Documentation Precautions:  Precautions Precautions: Fall Precaution Comments: Monitor SpO2, L knee  pain Restrictions Weight Bearing Restrictions: No  Pain: No/Denies pain.   Therapy/Group: Individual Therapy  Stirling Orton 02/17/2023, 10:39 AM

## 2023-02-18 LAB — GLUCOSE, CAPILLARY
Glucose-Capillary: 161 mg/dL — ABNORMAL HIGH (ref 70–99)
Glucose-Capillary: 144 mg/dL — ABNORMAL HIGH (ref 70–99)
Glucose-Capillary: 134 mg/dL — ABNORMAL HIGH (ref 70–99)

## 2023-02-18 MED ORDER — ALPRAZOLAM 0.5 MG PO TABS
0.5000 mg | ORAL_TABLET | Freq: Every day | ORAL | Status: DC | PRN
  Administered 2023-02-18 – 2023-02-25 (×8): 0.5 mg via ORAL
  Filled 2023-02-18 (×8): qty 1

## 2023-02-18 NOTE — Progress Notes (Signed)
Physical Therapy Weekly Progress Note  Patient Details  Name: Angela Lucero MRN: [id number] Date of Birth: Dec 17, 1940  Beginning of progress report period: February 11, 2023 End of progress report period: February 18, 2023  Today's Date: 02/18/2023 PT Individual Time: 1st Treatment Session: 4098-1191; 2nd Treatment Session: 4782-9562 PT Individual Time Calculation (min): 50 min; 84 min  Patient has met 0 of 3 short term goals. Patient continues to be limited functionally secondary to impaired endurance/activity tolerance. Patient is able to perform all functional mobility with CGA/Supv and use of RW, however limited by SOB and fatigue.   Patient continues to demonstrate the following deficits muscle weakness, decreased cardiorespiratoy endurance and decreased oxygen support, and decreased standing balance and decreased balance strategies and therefore will continue to benefit from skilled PT intervention to increase functional independence with mobility.  Patient progressing toward long term goals..  Plan of care revisions: Long distance gait goal of 150' discontinued secondary to patient's poor endurance/activity tolerance.  PT Short Term Goals Week 1:  PT Short Term Goal 1 (Week 1): Patient will initiate stair mobility PT Short Term Goal 1 - Progress (Week 1): Progressing toward goal PT Short Term Goal 2 (Week 1): Patient will ambulate >50' with LRAD and MinA PT Short Term Goal 2 - Progress (Week 1): Progressing toward goal PT Short Term Goal 3 (Week 1): Patient will tolerate staying out of the bed between therapy sessions PT Short Term Goal 3 - Progress (Week 1): Progressing toward goal Week 2:  PT Short Term Goal 1 (Week 2): Patient will initiate stair training PT Short Term Goal 2 (Week 2): Patient will ambulate x50' with LRAD and Supv PT Short Term Goal 3 (Week 2): Patient will tolerate sitting OOB in between treatment sessions  Skilled Therapeutic Interventions/Progress Updates: 1st Treatment  Session-  Patient greeted supine in bed and agreeable to PT treatment session. Patient transitioned to sitting EOB with use of bed rail and distant supv. While sitting EOB, patient donned shoes with set-up assistance. Patient stood from EOB with RW and then transferred to wheelchair via stand pivot and supv for safety. Patient wheeled to/from her room and dayroom for time management and energy conservation.   Patient gait trained x23', x30' and x32' back to her room with RW and SBA for safety- VC for stepping within the frame of the walker and improved postural extension/forward gaze with good effort noted, however limited by poor endurance/activity tolerance. Patient required extended seated rest break after each gait trial secondary to notable fatigue.   Patient returned to her room and ambulated form the hallway to sitting EOB- Patient transitioned from sitting EOB to supine with supv. Patient left supine in bed with bed alarm on, call bell within reach and all needs met.    2nd Treatment Session-  Patient greeted supine in bed with NT present taking vitals and agreeable to PT treatment session. Patient transitioned to sitting EOB with use of bed rail and distant supv. Patient stood from EOB and performed stand pivot transfer to wheelchair with RW and supv for safety. Patient wheeled to/from her room and day room for time management and energy conservation. Therapist managing oxygen tank and line throughout treatment session in order to improve overall safety.   Patient performed various sit/stands and stand pivot transfers with use of RW and SBA/Supv for safety.   Patient gait trained x44' with RW and SBA for safety- VC for improved postural extension, forward gaze and stepping within the frame of the  walker. Patient able to make corrections, however limited secondary to impaired endurance/activity tolerance.   Attempted to have patient stand while playing a game with therapist, however patient  refused to stand at this time secondary to fatigue. Patient remained sitting while playing Guess Who with therapist in order to improve overall affect/engagement in therapy session. Patient played two rounds of the game and required Min cues for deductive reasoning strategies.   Patient performed seated therex in order to improve global strength and ROM for improved functional mobility-  -Rt LAQ with 2.5#, 2 x 10  -Rt hip flexion with 2.5#, 2 x 10  -Lt LAQ with AROM, x10- Limited by knee pain -Lt hip flexion AROM, x10- Limited by knee pain  Patient returned to her room and requesting to use the restroom prior to transitioning into the bed- Patient gait trained ~10' to the toilet from her wheelchair with CGA/SBA for safety- Patient with continent void and BM while sitting on the toiler (RN notified and agreeable to charting in the flowsheet). Patient required total assist for pericare secondary to fatigue- Patient reported increased discomfort of her bottom from increased wiping so therapist applied barrier cream and new brief. Patient gait trained from the toilet to sitting EOB with SBA for safety. While sitting EOB RN administered pain and anxiety medications. Patient transitioned to supine with Supv and left supine in bed with bed alarm on, call bell within reach and all needs met.    Therapy Documentation Precautions:  Precautions Precautions: Fall Precaution Comments: Monitor SpO2, L knee pain Restrictions Weight Bearing Restrictions: No  Pain: 6/10 global pain, however already received pain medication and improves with seated rest breaks.   Therapy/Group: Individual Therapy  Avalina Lucero 02/18/2023, 7:57 AM

## 2023-02-18 NOTE — Progress Notes (Signed)
Occupational Therapy Session Note  Patient Details  Name: Tynasia Mccaul MRN: [id number] Date of Birth: 12-19-1940  Today's Date: 02/18/2023 OT Individual Time: 4098-1191 OT Individual Time Calculation (min): 50 min    Short Term Goals: Week 2:  OT Short Term Goal 1 (Week 2): LTG=STG 2/2 ELOS  Skilled Therapeutic Interventions/Progress Updates:    Pt. Received lying supine in bed receiving breathing treatment.  Nurse present stating that pt was having difficulty breathing this am.  Pt agreed to get EOB and perform self care.  Pt completed UB bathing with Min A overall.  Pt performed LB bathing with Max assist and was able to perform sit<>stand with RW with CGA for safety. Pt able to go from EOB to supine with (S).  Grooming performed lying supine in bed with (S). Pt required multiple extended rest breaks related to SOB/ fatigue. Pt not agreeable to any further OT this am. Pt left lying supine in bed, with bed alarm set, call light within reach and all needs met.  Therapy Documentation Precautions:  Precautions Precautions: Fall Precaution Comments: Monitor SpO2, L knee pain Restrictions Weight Bearing Restrictions: No    Therapy/Group: Individual Therapy  Liam Graham 02/18/2023, 7:46 AM

## 2023-02-18 NOTE — Progress Notes (Signed)
PROGRESS NOTE   Subjective/Complaints: Prn nitroglycerin and xanax consistently upon waking.  Patient says this is because when she first wakes up in the morning, the anticipation of the day generally makes her extremely anxious.  She denies any shortness of breath associated with lying flat, denies any other associating factors.  She states the Xanax has not been effective.  BuSpar has been helping.  No incontinence or ISC overnight  ROS:  + Chest pain/angina - intermittent, intermittently reproducible on palpation, responsive to nitroglycerin and Xanax + abdominal cramping/constipation - resolved + generalized weakness - ongoing + Urinary retention -ongoing Denies fevers, chills, N/V, abdominal pain, diarrhea, cough, chest pain, new weakness or paraesthesias.   As per HPI  Objective:   No results found. Recent Labs    02/15/23 1041  WBC 9.1  HGB 11.1*  HCT 34.7*  PLT 232    Recent Labs    02/15/23 1041  NA 136  K 4.3  CL 97*  CO2 28  GLUCOSE 145*  BUN 67*  CREATININE 1.36*  CALCIUM 9.2     Intake/Output Summary (Last 24 hours) at 02/18/2023 0926 Last data filed at 02/17/2023 1750 Gross per 24 hour  Intake 480 ml  Output --  Net 480 ml         Physical Exam: Vital Signs Blood pressure (!) 144/73, pulse (!) 110, temperature 97.8 F (36.6 C), temperature source Oral, resp. rate 18, height 5\' 6"  (1.676 m), weight 77.5 kg, SpO2 98 %. Physical Exam Constitutional: No apparent distress. Appropriate appearance for age. +Obese.  Working with PT in gym.  HENT: No JVD. Neck Supple. Trachea midline. Atraumatic, normocephalic. Eyes: PERRLA. EOMI. Visual fields grossly intact.  Cardiovascular: RRR no murmurs/rub/gallops.  1+ pitting edema isolated to the bilateral feet. Peripheral pulses 2+  Respiratory: CTAB.  No rales, rhonchi, or wheezing.  + 2 L [place] Abdomen: + Large hernia/diastasis, normoactive BS  throughout.  Skin: C/D/I. No apparent lesions. Chronic skin changes on B/L dorsal feet. +BL UE bruising.  MSK:          Strength: antigravity and against resistance all 4 extremities; 4/5   No further TTP over left chest, sternum  Neurologic exam:  Cognition: AAO to person, place, time and event.  Insight: Good  insight into current condition.  Sensation: To light touch intact in BL UEs and LEs  Reflexes: 2+ in BL UE and LEs. CN: 2-12 grossly intact.    Assessment/Plan: 1. Functional deficits which require 3+ hours per day of interdisciplinary therapy in a comprehensive inpatient rehab setting. Physiatrist is providing close team supervision and 24 hour management of active medical problems listed below. Physiatrist and rehab team continue to assess barriers to discharge/monitor patient progress toward functional and medical goals  Care Tool:  Bathing  Bathing activity did not occur: Refused           Bathing assist       Upper Body Dressing/Undressing Upper body dressing   What is the patient wearing?: Dress    Upper body assist Assist Level: Supervision/Verbal cueing    Lower Body Dressing/Undressing Lower body dressing      What is the patient wearing?:  Underwear/pull up     Lower body assist Assist for lower body dressing: Moderate Assistance - Patient 50 - 74%     Toileting Toileting    Toileting assist Assist for toileting: Moderate Assistance - Patient 50 - 74%     Transfers Chair/bed transfer  Transfers assist     Chair/bed transfer assist level: Minimal Assistance - Patient > 75%     Locomotion Ambulation   Ambulation assist      Assist level: Minimal Assistance - Patient > 75% Assistive device: Walker-rolling Max distance: 10'   Walk 10 feet activity   Assist     Assist level: Minimal Assistance - Patient > 75% Assistive device: Walker-rolling   Walk 50 feet activity   Assist Walk 50 feet with 2 turns activity did not  occur: Safety/medical concerns (Unable to ambulate >10' or perform stair mobility secondary to global deconditioning)         Walk 150 feet activity   Assist Walk 150 feet activity did not occur: Safety/medical concerns         Walk 10 feet on uneven surface  activity   Assist Walk 10 feet on uneven surfaces activity did not occur: Safety/medical concerns         Wheelchair     Assist Is the patient using a wheelchair?: Yes Type of Wheelchair: Manual Wheelchair activity did not occur:  (Therapist dependently propelling manual wheelchair secondary to fatigue)  Wheelchair assist level: Dependent - Patient 0% Max wheelchair distance: 150    Wheelchair 50 feet with 2 turns activity    Assist        Assist Level: Dependent - Patient 0%   Wheelchair 150 feet activity     Assist      Assist Level: Dependent - Patient 0%   Blood pressure (!) 144/73, pulse (!) 110, temperature 97.8 F (36.6 C), temperature source Oral, resp. rate 18, height 5\' 6"  (1.676 m), weight 77.5 kg, SpO2 98 %.  Medical Problem List and Plan: 1. Functional deficits secondary to debility/CHF/acute hypoxic respiratory failure             -patient may shower             -ELOS/Goals: 10-14 days, SPV goals PT/OT - 6/28 - 6/14: D/w family, SW plan for discharge to hospice nursing after IPR; no referrals sent while in acute. Family working with Palliative for OP referral; will let us know if they need anything.   - 6/17: RR overnight for chest pain, Sob/tachypnea. Troponins stable 30s, EKG no STEMI. Mild weight increase, mild vascular congestion on CXR improved from last exam. Give lasix 20 mg x1, consult HF team. Responsive to PRN nitroglycerin.  - 6/18: Per son, will need to be intermittent SPV at home with neighbors; patient will be paying caregiver every other day and having her call on off days. Per therapies, self-limiting but is improving. With PT today CGA 13 ft, stair and car  transfer goals at SPV and all otehrs Mod I goals.  - 6/20: Make 15/7 for recurrent CP, SOB  2.  Antithrombotics: -DVT/anticoagulation:  Pharmaceutical: Lovenox 40mg  QD             -antiplatelet therapy: Aspirin 81 mg daily 3. Pain Management: Tramadol as needed 4. Mood/Behavior/Sleep: Melatonin 5 mg nightly--sleeping well, cont regimen             -antipsychotic agents: N/A  - 6/18: Start Buspar 7.5 mg BID for anxiety, Xanax 0.25 mg BID  PRN for severe episodes while uptitrating  - 6/20: responsive to PRN xanax, ongoing anxiety/recurrent CP/SOB, increase Buspar to 7.5 mg TID   - 6/21: Adjust Xanax to 0.5 mg once daily as needed given consistent use upon waking; can further uptitrate BuSpar in 2 to 3 days  5. Neuropsych/cognition: This patient is capable of making decisions on her own behalf. 6. Skin/Wound Care: Routine skin checks 7. Fluids/Electrolytes/Nutrition: Routine in and outs with follow-up chemistries - 6/14: Na low-stable 133 this AM; K 3.2 add K dur 20 meq BID x3 days; BUN/Cr stable 1.2-1.5 -02/12/23 Na stable at 132, K 4.1, BUN/Cr improving, will decrease Kdur to QD x2 more days for now and recheck on routine labs on Monday  - 6/17: Na stable, BUN increasing, Cr stable, K WNL - 6/18: Na stable, K WNL  8.  COPD exacerbation/tobacco use.  Continue nebulizers as advised.  Check oxygen saturations every shift. Tobacco cessation education; does not want patches.    9.  Hypertension.  Lasix 40 mg twice daily, Imdur 60 mg daily, Cozaar 12.5 mg daily, Hydralazine 10 mg every 8 hours, Toprol-XL 25 mg daily.  Monitor with increased mobility - 6/14: Mildly low today; monitor with diuresis, if symptomatic may need cards to assist in adjusting -6/15-16/24 BPs uptrending, monitor for now - 6/17-20: BP stable/low - may be too low with flomax 0.8 mg. Reduce to 0.4 mg; monitor for orthostasis   Vitals:   02/15/23 2142 02/16/23 0427 02/16/23 0548 02/16/23 0713  BP: (!) 103/50 129/68 122/72  132/73   02/16/23 1304 02/16/23 1934 02/16/23 2234 02/17/23 0535  BP: (!) 92/55 119/64 (!) 96/56 127/67   02/17/23 1240 02/17/23 2006 02/18/23 0418 02/18/23 0709  BP: (!) 99/53 108/80 139/73 (!) 144/73     10.  Diastolic congestive heart failure/intermittent chest pain.  Ejection fraction 20 to 25%.  Continue diuresis.  Jardiance 25 mg daily.  Follow-up heart failure team. Daily weights.    - 6/14: Likely decreased d/t bed vs standing, monitor trend  -6/15-16/24 wt stable , cont monitoring  - 6/17: Lasix 20 mg once today as below. Per HF team recs: "May be demand ischemia with CHF though wt has continued to decrease.  EKG without acute changes.  Not on tele in rehab  - could consider other CAD beside LAD on imdur 60 - could add ranexa 500 BID or increase imdur to 90 mg daily, will give another 30 mg po now then 90 daily"  - 6/18-20: 1-2x daily NTG for CP; weights stable; anxiety component as above  Filed Weights   02/16/23 0920 02/17/23 0500 02/18/23 0500  Weight: 77.5 kg 77.5 kg 77.5 kg      11.  AKI on CKD/hyponatremia.  Follow-up chemistries.  Entresto on hold until renal function improved. - Cr stable 1.5; has been 1.2-1.5; monitor BMP in AM d/t low BP and significant diuresis -02/12/23 Cr stable 1.38; monitor routine labs - 6/17: Cr 1.5; stable. BUN uptrending. Recheck in AM   - 6/18: Cr 1.3, BUN 67; improved  12.  Hyperlipidemia.  Lipitor 80mg  QD   13.  Type II diabetes mellitus.  Hemoglobin A1c 6.0.   - 6/14: BG stable, continue SSI and Jardiance 25mg  every day  - 6/15-18 CBGs stable, monitor Recent Labs    02/17/23 1637 02/17/23 2110 02/18/23 0555  GLUCAP 118* 202* 161*       14.  Constipation. Failed enema today. Passing gas. Check KUB - mild gaseous distention, small volume stool.  Adjust bowel program - Sennakot S 2 tabs BID, PRN sorbitol.   - 6/14: Small BM and smear yesterday. 1x sorbitol. Add Miralax daily to regimen. Add PRN suppository -02/12/23 LBM this  morning, monitor' -02/13/23 multiple BMs overnight, reportedly black/dark colored; pt asymptomatic. H&H showing slight decrease in Hgb 11.7 (previously 12.3-12.8); will recheck H&H at 6hr mark, get fecal occult card done, and monitor closely; if continuing to have melanotic stools or worsening anemia, may need to consult GI/start protonix/stop lovenox; will monitor.  - 6/17: + occult blood x2, HgB stable, start protonix BID - 6/19: Increase miralax to BID, hard stool 6/18 - 6/19 large BM  15. Urinary retention. High PVR 700 yesterday. Check x3 days, start Flomax 0.4 mg at bedtime.  - 6/19: High retention overnight, ISCx1. Increase flomax to 0.8 mg, timed toiletting at bedtime Q4H. UA ordered. -> reduced flomax d/t hypotension  - 6/20: UA with rare bacteria, small LE, no Nitrites; given no apparent symptoms other than retention will not treat. Bethenachol contraindicated d/t CAD   LOS: 8 days A FACE TO FACE EVALUATION WAS PERFORMED  Angelina Sheriff 02/18/2023, 9:26 AM

## 2023-02-18 NOTE — Plan of Care (Signed)
  Problem: RH Dressing Goal: LTG Patient will perform lower body dressing w/assist (OT) Description: LTG: Patient will perform lower body dressing with assist, with/without cues in positioning using equipment (OT) Flowsheets (Taken 02/18/2023 1426) LTG: Pt will perform lower body dressing with assistance level of: Supervision/Verbal cueing Note: Goal downgraded 6/21-ESD   Problem: RH Toileting Goal: LTG Patient will perform toileting task (3/3 steps) with assistance level (OT) Description: LTG: Patient will perform toileting task (3/3 steps) with assistance level (OT)  Flowsheets (Taken 02/18/2023 1426) LTG: Pt will perform toileting task (3/3 steps) with assistance level: Supervision/Verbal cueing Note: Goal downgraded 6/21-ESD   Problem: RH Toilet Transfers Goal: LTG Patient will perform toilet transfers w/assist (OT) Description: LTG: Patient will perform toilet transfers with assist, with/without cues using equipment (OT) Flowsheets (Taken 02/18/2023 1426) LTG: Pt will perform toilet transfers with assistance level of: Supervision/Verbal cueing Note: Goal downgraded 6/21-ESD   Problem: RH Tub/Shower Transfers Goal: LTG Patient will perform tub/shower transfers w/assist (OT) Description: LTG: Patient will perform tub/shower transfers with assist, with/without cues using equipment (OT) Flowsheets (Taken 02/18/2023 1426) LTG: Pt will perform tub/shower stall transfers with assistance level of: Supervision/Verbal cueing Note: Goal downgraded 6/21-ESD

## 2023-02-18 NOTE — Progress Notes (Signed)
Occupational Therapy Weekly Progress Note  Patient Details  Name: Angela Lucero MRN: [id number] Date of Birth: November 01, 1940  Beginning of progress report period: February 11, 2023 End of progress report period: February 18, 2023   Patient has met 2 of 3 short term goals.  Pt is making slow progress towards OT goals She has been limited by chest pain, shortness of breath, and low energy. Pt has made progress with mobility and can ambulate short distances with RW and min/CGA, but needs up to moderate assistance for LB self-care and BADL tasks. Most OT goals have been downgraded to supervision.   Patient continues to demonstrate the following deficits: muscle weakness, decreased cardiorespiratoy endurance and decreased oxygen support, and decreased sitting balance, decreased standing balance, and decreased balance strategies and therefore will continue to benefit from skilled OT intervention to enhance overall performance with BADL, iADL, and Reduce care partner burden.  Patient not progressing toward long term goals.  See goal revision..  Plan of care revisions: goals downgraded to supervision. .  OT Short Term Goals Week 1:  OT Short Term Goal 1 (Week 1): Pt will complete LB dressing with CGA OT Short Term Goal 1 - Progress (Week 1): Met OT Short Term Goal 2 (Week 1): Pt wil complete toileting with Min A OT Short Term Goal 2 - Progress (Week 1): Met OT Short Term Goal 3 (Week 1): Pt will complete LB bathing with CGA OT Short Term Goal 3 - Progress (Week 1): Progressing toward goal Week 2:  OT Short Term Goal 1 (Week 2): LTG=STG 2/2 ELOS   Therapy Documentation Precautions:  Precautions Precautions: Fall Precaution Comments: Monitor SpO2, L knee pain Restrictions Weight Bearing Restrictions: No  Pain: 7/10 chest pain, rest and repositioned.   Therapy/Group: Individual Therapy  Mal Amabile 02/18/2023, 11:56 AM

## 2023-02-18 NOTE — Plan of Care (Signed)
Problem: RH Ambulation Goal: LTG Patient will ambulate in community environment (PT) Description: LTG: Patient will ambulate in community environment, # of feet with assistance (PT). Outcome: Not Applicable Flowsheets (Taken 02/18/2023 1553) LTG: Ambulation distance in community environment: (SR 6/21) -- Note: Goal discontinued at this time secondary to impaired endurance/activity tolerance limiting functional mobility- SR 6/21

## 2023-02-19 LAB — GLUCOSE, CAPILLARY
Glucose-Capillary: 120 mg/dL — ABNORMAL HIGH (ref 70–99)
Glucose-Capillary: 192 mg/dL — ABNORMAL HIGH (ref 70–99)

## 2023-02-19 NOTE — Progress Notes (Signed)
Patient requested PRN Ultram for generalized body pain. Also requested her PRN XANAX 0.5 MG.Patient gets anxious when she starts experiencing pain. Upon recheck patient states her pain has decreased and anxiety was tolerable.

## 2023-02-19 NOTE — Progress Notes (Signed)
PROGRESS NOTE   Subjective/Complaints:  Doing alright today, seems to be in better mental space. Slept well. Denies any pain, states it's managed well with meds when she needs them. Denies CP today. LBM yesterday, going regularly. Urinating well. Denies any other complaints or concerns today.   ROS:  + Chest pain/angina - intermittent, intermittently reproducible on palpation, responsive to nitroglycerin and Xanax + abdominal cramping/constipation - resolved + generalized weakness - ongoing + Urinary retention -ongoing Denies fevers, chills, SOB, N/V, abdominal pain, diarrhea, cough, chest pain, new weakness or paraesthesias.   As per HPI  Objective:   No results found. No results for input(s): "WBC", "HGB", "HCT", "PLT" in the last 72 hours.  No results for input(s): "NA", "K", "CL", "CO2", "GLUCOSE", "BUN", "CREATININE", "CALCIUM" in the last 72 hours.   Intake/Output Summary (Last 24 hours) at 02/19/2023 1249 Last data filed at 02/19/2023 0700 Gross per 24 hour  Intake 600 ml  Output --  Net 600 ml        Physical Exam: Vital Signs Blood pressure 113/60, pulse 96, temperature 97.7 F (36.5 C), temperature source Oral, resp. rate 18, height 5\' 6"  (1.676 m), weight 75.7 kg, SpO2 98 %. Physical Exam Constitutional: No apparent distress. Appropriate appearance for age. +Obese.  Working with PT in gym.  HENT: No JVD. Neck Supple. Trachea midline. Atraumatic, normocephalic. Eyes: PERRLA. EOMI. Visual fields grossly intact.  Cardiovascular: RRR no murmurs/rub/gallops.  Trace pitting edema isolated to the bilateral feet/ankles. Peripheral pulses 2+  Respiratory: CTAB.  No rales, rhonchi, or wheezing.  + 2 L [place] Abdomen: + Large hernia/diastasis, normoactive BS throughout. Soft and NTND.  Skin: C/D/I. No apparent lesions. Chronic skin changes on B/L dorsal feet. +BL UE bruising.   PRIOR EXAMS: MSK:          Strength:  antigravity and against resistance all 4 extremities; 4/5   No further TTP over left chest, sternum  Neurologic exam:  Cognition: AAO to person, place, time and event.  Insight: Good  insight into current condition.  Sensation: To light touch intact in BL UEs and LEs  Reflexes: 2+ in BL UE and LEs. CN: 2-12 grossly intact.    Assessment/Plan: 1. Functional deficits which require 3+ hours per day of interdisciplinary therapy in a comprehensive inpatient rehab setting. Physiatrist is providing close team supervision and 24 hour management of active medical problems listed below. Physiatrist and rehab team continue to assess barriers to discharge/monitor patient progress toward functional and medical goals  Care Tool:  Bathing  Bathing activity did not occur: Refused Body parts bathed by patient: Chest, Right arm, Left arm, Abdomen, Front perineal area, Right upper leg, Left upper leg, Face   Body parts bathed by helper: Left lower leg, Right lower leg, Buttocks     Bathing assist Assist Level: Moderate Assistance - Patient 50 - 74%     Upper Body Dressing/Undressing Upper body dressing   What is the patient wearing?: Dress    Upper body assist Assist Level: Supervision/Verbal cueing    Lower Body Dressing/Undressing Lower body dressing      What is the patient wearing?: Pants     Lower body assist  Assist for lower body dressing: Moderate Assistance - Patient 50 - 74%     Toileting Toileting    Toileting assist Assist for toileting: Minimal Assistance - Patient > 75%     Transfers Chair/bed transfer  Transfers assist     Chair/bed transfer assist level: Contact Guard/Touching assist     Locomotion Ambulation   Ambulation assist      Assist level: Minimal Assistance - Patient > 75% Assistive device: Walker-rolling Max distance: 10'   Walk 10 feet activity   Assist     Assist level: Minimal Assistance - Patient > 75% Assistive device:  Walker-rolling   Walk 50 feet activity   Assist Walk 50 feet with 2 turns activity did not occur: Safety/medical concerns (Unable to ambulate >10' or perform stair mobility secondary to global deconditioning)         Walk 150 feet activity   Assist Walk 150 feet activity did not occur: Safety/medical concerns         Walk 10 feet on uneven surface  activity   Assist Walk 10 feet on uneven surfaces activity did not occur: Safety/medical concerns         Wheelchair     Assist Is the patient using a wheelchair?: Yes Type of Wheelchair: Manual Wheelchair activity did not occur:  (Therapist dependently propelling manual wheelchair secondary to fatigue)  Wheelchair assist level: Dependent - Patient 0% Max wheelchair distance: 150    Wheelchair 50 feet with 2 turns activity    Assist        Assist Level: Dependent - Patient 0%   Wheelchair 150 feet activity     Assist      Assist Level: Dependent - Patient 0%   Blood pressure 113/60, pulse 96, temperature 97.7 F (36.5 C), temperature source Oral, resp. rate 18, height 5\' 6"  (1.676 m), weight 75.7 kg, SpO2 98 %.  Medical Problem List and Plan: 1. Functional deficits secondary to debility/CHF/acute hypoxic respiratory failure             -patient may shower             -ELOS/Goals: 10-14 days, SPV goals PT/OT - 6/28 - 6/14: D/w family, SW plan for discharge to hospice nursing after IPR; no referrals sent while in acute. Family working with Palliative for OP referral; will let us know if they need anything.   - 6/17: RR overnight for chest pain, Sob/tachypnea. Troponins stable 30s, EKG no STEMI. Mild weight increase, mild vascular congestion on CXR improved from last exam. Give lasix 20 mg x1, consult HF team. Responsive to PRN nitroglycerin.  - 6/18: Per son, will need to be intermittent SPV at home with neighbors; patient will be paying caregiver every other day and having her call on off days. Per  therapies, self-limiting but is improving. With PT today CGA 13 ft, stair and car transfer goals at SPV and all otehrs Mod I goals.  - 6/20: Make 15/7 for recurrent CP, SOB  2.  Antithrombotics: -DVT/anticoagulation:  Pharmaceutical: Lovenox 30mg  QD             -antiplatelet therapy: Aspirin 81 mg daily 3. Pain Management: Tramadol as needed 4. Mood/Behavior/Sleep: Melatonin 5 mg nightly--sleeping well, cont regimen             -antipsychotic agents: N/A - 6/18: Start Buspar 7.5 mg BID for anxiety, Xanax 0.25 mg BID PRN for severe episodes while uptitrating - 6/20: responsive to PRN xanax, ongoing anxiety/recurrent CP/SOB, increase  Buspar to 7.5 mg TID  - 6/21: Adjust Xanax to 0.5 mg once daily as needed given consistent use upon waking; can further uptitrate BuSpar in 2 to 3 days  5. Neuropsych/cognition: This patient is capable of making decisions on her own behalf. 6. Skin/Wound Care: Routine skin checks 7. Fluids/Electrolytes/Nutrition: Routine in and outs with follow-up chemistries - 6/14: Na low-stable 133 this AM; K 3.2 add K dur 20 meq BID x3 days; BUN/Cr stable 1.2-1.5 -02/12/23 Na stable at 132, K 4.1, BUN/Cr improving, will decrease Kdur to QD x2 more days for now and recheck on routine labs on Monday  - 6/17: Na stable, BUN increasing, Cr stable, K WNL - 6/18: Na stable, K WNL  8.  COPD exacerbation/tobacco use.  Continue nebulizers as advised.  Check oxygen saturations every shift. Tobacco cessation education; does not want patches.    9.  Hypertension.  Lasix 40 mg twice daily, Imdur 60 mg daily, Cozaar 12.5 mg daily, Hydralazine 10 mg every 8 hours, Toprol-XL 25 mg daily.  Monitor with increased mobility - 6/14: Mildly low today; monitor with diuresis, if symptomatic may need cards to assist in adjusting -6/15-16/24 BPs uptrending, monitor for now - 6/17-20: BP stable/low - may be too low with flomax 0.8 mg. Reduce to 0.4 mg; monitor for orthostasis -02/19/23 BP seems to be  improving; monitor for trend   Vitals:   02/16/23 1304 02/16/23 1934 02/16/23 2234 02/17/23 0535  BP: (!) 92/55 119/64 (!) 96/56 127/67   02/17/23 1240 02/17/23 2006 02/18/23 0418 02/18/23 0709  BP: (!) 99/53 108/80 139/73 (!) 144/73   02/18/23 1304 02/18/23 1951 02/18/23 2234 02/19/23 0603  BP: (!) 100/57 (!) 110/58 (!) 93/47 113/60     10.  Diastolic congestive heart failure/intermittent chest pain.  Ejection fraction 20 to 25%.  Continue diuresis.  Jardiance 25 mg daily.  Follow-up heart failure team. Daily weights.    - 6/14: Likely decreased d/t bed vs standing, monitor trend  -6/15-16/24 wt stable , cont monitoring - 6/17: Lasix 20 mg once today as below. Per HF team recs: "May be demand ischemia with CHF though wt has continued to decrease.  EKG without acute changes.  Not on tele in rehab  - could consider other CAD beside LAD on imdur 60 - could add ranexa 500 BID or increase imdur to 90 mg daily, will give another 30 mg po now then 90 daily" - 6/18-20: 1-2x daily NTG for CP; weights stable; anxiety component as above -02/19/23 seems to be doing better with regards to CP; wt a little lower; monitor Filed Weights   02/17/23 0500 02/18/23 0500 02/19/23 0603  Weight: 77.5 kg 77.5 kg 75.7 kg      11.  AKI on CKD/hyponatremia.  Follow-up chemistries.  Entresto on hold until renal function improved. - Cr stable 1.5; has been 1.2-1.5; monitor BMP in AM d/t low BP and significant diuresis -02/12/23 Cr stable 1.38; monitor routine labs - 6/17: Cr 1.5; stable. BUN uptrending. Recheck in AM   - 6/18: Cr 1.3, BUN 67; improved  12.  Hyperlipidemia.  Lipitor 80mg  QD   13.  Type II diabetes mellitus.  Hemoglobin A1c 6.0.   - 6/14: BG stable, continue SSI and Jardiance 25mg  every day  - 6/15-22/24 CBGs stable, monitor Recent Labs    02/18/23 1117 02/18/23 1625 02/19/23 1222  GLUCAP 144* 134* 120*      14.  Constipation. Failed enema today. Passing gas. Check KUB - mild gaseous  distention, small volume stool.  Adjust bowel program - Sennakot S 2 tabs BID, PRN sorbitol.   - 6/14: Small BM and smear yesterday. 1x sorbitol. Add Miralax daily to regimen. Add PRN suppository -02/12/23 LBM this morning, monitor' -02/13/23 multiple BMs overnight, reportedly black/dark colored; pt asymptomatic. H&H showing slight decrease in Hgb 11.7 (previously 12.3-12.8); will recheck H&H at 6hr mark, get fecal occult card done, and monitor closely; if continuing to have melanotic stools or worsening anemia, may need to consult GI/start protonix/stop lovenox; will monitor.  - 6/17: + occult blood x2, HgB stable, start protonix BID - 6/19: Increase miralax to BID, hard stool 6/18 - 02/19/23 having daily BMs now, monitor  15. Urinary retention. High PVR 700 yesterday. Check x3 days, start Flomax 0.4 mg at bedtime. - 6/19: High retention overnight, ISCx1. Increase flomax to 0.8 mg, timed toiletting at bedtime Q4H. UA ordered. -> reduced flomax d/t hypotension - 6/20: UA with rare bacteria, small LE, no Nitrites; given no apparent symptoms other than retention will not treat. Bethenachol contraindicated d/t CAD   LOS: 9 days A FACE TO FACE EVALUATION WAS PERFORMED  [address] Fredrika Canby 02/19/2023, 12:49 PM

## 2023-02-20 LAB — GLUCOSE, CAPILLARY
Glucose-Capillary: 151 mg/dL — ABNORMAL HIGH (ref 70–99)
Glucose-Capillary: 164 mg/dL — ABNORMAL HIGH (ref 70–99)
Glucose-Capillary: 108 mg/dL — ABNORMAL HIGH (ref 70–99)

## 2023-02-20 MED ORDER — WHITE PETROLATUM EX OINT: TOPICAL_OINTMENT | CUTANEOUS | Status: DC | PRN

## 2023-02-20 NOTE — Progress Notes (Signed)
AM BS 187 not transmitting. 2 units per order

## 2023-02-20 NOTE — Progress Notes (Signed)
PROGRESS NOTE   Subjective/Complaints:  Doing well today. Slept ok. Pain still managed. Denies CP today, had some anxiety and CP earlier but it was brief and not ongoing. LBM yesterday, going regularly. Urinating well. Nose irritated from nasal cannula, wants vaseline. Denies any other complaints or concerns today.   ROS:  + Chest pain/angina - intermittent, intermittently reproducible on palpation, responsive to nitroglycerin and Xanax- currently improved + abdominal cramping/constipation - resolved + generalized weakness - ongoing + Urinary retention -improved Denies fevers, chills, SOB, N/V, abdominal pain, diarrhea, cough, chest pain, new weakness or paraesthesias.   As per HPI  Objective:   No results found. No results for input(s): "WBC", "HGB", "HCT", "PLT" in the last 72 hours.  No results for input(s): "NA", "K", "CL", "CO2", "GLUCOSE", "BUN", "CREATININE", "CALCIUM" in the last 72 hours.   Intake/Output Summary (Last 24 hours) at 02/20/2023 1211 Last data filed at 02/20/2023 0911 Gross per 24 hour  Intake 1075 ml  Output --  Net 1075 ml        Physical Exam: Vital Signs Blood pressure 115/61, pulse 100, temperature 97.8 F (36.6 C), temperature source Oral, resp. rate 18, height 5\' 6"  (1.676 m), weight 76 kg, SpO2 96 %. Physical Exam Constitutional: No apparent distress. Appropriate appearance for age. +Obese. Sitting in bedside chair HENT: No JVD. Neck Supple. Trachea midline. Atraumatic, normocephalic. Eyes: PERRLA. EOMI. Visual fields grossly intact.  Cardiovascular: RRR no murmurs/rub/gallops.  Trace pitting edema isolated to the bilateral feet/ankles. Peripheral pulses 2+  Respiratory: CTAB.  No rales, rhonchi, or wheezing.  + 2 L [place] Abdomen: + Large hernia/diastasis, normoactive BS throughout. Soft and NTND.  Skin: C/D/I. No apparent lesions. Chronic skin changes on B/L dorsal feet. +BL UE bruising.    PRIOR EXAMS: MSK:          Strength: antigravity and against resistance all 4 extremities; 4/5   No further TTP over left chest, sternum  Neurologic exam:  Cognition: AAO to person, place, time and event.  Insight: Good  insight into current condition.  Sensation: To light touch intact in BL UEs and LEs  Reflexes: 2+ in BL UE and LEs. CN: 2-12 grossly intact.    Assessment/Plan: 1. Functional deficits which require 3+ hours per day of interdisciplinary therapy in a comprehensive inpatient rehab setting. Physiatrist is providing close team supervision and 24 hour management of active medical problems listed below. Physiatrist and rehab team continue to assess barriers to discharge/monitor patient progress toward functional and medical goals  Care Tool:  Bathing  Bathing activity did not occur: Refused Body parts bathed by patient: Chest, Right arm, Left arm, Abdomen, Front perineal area, Right upper leg, Left upper leg, Face   Body parts bathed by helper: Left lower leg, Right lower leg, Buttocks     Bathing assist Assist Level: Moderate Assistance - Patient 50 - 74%     Upper Body Dressing/Undressing Upper body dressing   What is the patient wearing?: Dress    Upper body assist Assist Level: Supervision/Verbal cueing    Lower Body Dressing/Undressing Lower body dressing      What is the patient wearing?: Pants     Lower  body assist Assist for lower body dressing: Moderate Assistance - Patient 50 - 74%     Toileting Toileting    Toileting assist Assist for toileting: Minimal Assistance - Patient > 75%     Transfers Chair/bed transfer  Transfers assist     Chair/bed transfer assist level: Contact Guard/Touching assist     Locomotion Ambulation   Ambulation assist      Assist level: Minimal Assistance - Patient > 75% Assistive device: Walker-rolling Max distance: 10'   Walk 10 feet activity   Assist     Assist level: Minimal Assistance -  Patient > 75% Assistive device: Walker-rolling   Walk 50 feet activity   Assist Walk 50 feet with 2 turns activity did not occur: Safety/medical concerns (Unable to ambulate >10' or perform stair mobility secondary to global deconditioning)         Walk 150 feet activity   Assist Walk 150 feet activity did not occur: Safety/medical concerns         Walk 10 feet on uneven surface  activity   Assist Walk 10 feet on uneven surfaces activity did not occur: Safety/medical concerns         Wheelchair     Assist Is the patient using a wheelchair?: Yes Type of Wheelchair: Manual Wheelchair activity did not occur:  (Therapist dependently propelling manual wheelchair secondary to fatigue)  Wheelchair assist level: Dependent - Patient 0% Max wheelchair distance: 150    Wheelchair 50 feet with 2 turns activity    Assist        Assist Level: Dependent - Patient 0%   Wheelchair 150 feet activity     Assist      Assist Level: Dependent - Patient 0%   Blood pressure 115/61, pulse 100, temperature 97.8 F (36.6 C), temperature source Oral, resp. rate 18, height 5\' 6"  (1.676 m), weight 76 kg, SpO2 96 %.  Medical Problem List and Plan: 1. Functional deficits secondary to debility/CHF/acute hypoxic respiratory failure             -patient may shower             -ELOS/Goals: 10-14 days, SPV goals PT/OT - 6/28 - 6/14: D/w family, SW plan for discharge to hospice nursing after IPR; no referrals sent while in acute. Family working with Palliative for OP referral; will let us know if they need anything.   - 6/17: RR overnight for chest pain, Sob/tachypnea. Troponins stable 30s, EKG no STEMI. Mild weight increase, mild vascular congestion on CXR improved from last exam. Give lasix 20 mg x1, consult HF team. Responsive to PRN nitroglycerin.  - 6/18: Per son, will need to be intermittent SPV at home with neighbors; patient will be paying caregiver every other day and  having her call on off days. Per therapies, self-limiting but is improving. With PT today CGA 13 ft, stair and car transfer goals at SPV and all otehrs Mod I goals.  - 6/20: Make 15/7 for recurrent CP, SOB  2.  Antithrombotics: -DVT/anticoagulation:  Pharmaceutical: Lovenox 30mg  QD             -antiplatelet therapy: Aspirin 81 mg daily 3. Pain Management: Tramadol as needed 4. Mood/Behavior/Sleep: Melatonin 5 mg nightly--sleeping well, cont regimen             -antipsychotic agents: N/A - 6/18: Start Buspar 7.5 mg BID for anxiety, Xanax 0.25 mg BID PRN for severe episodes while uptitrating - 6/20: responsive to PRN xanax, ongoing anxiety/recurrent  CP/SOB, increase Buspar to 7.5 mg TID  - 6/21: Adjust Xanax to 0.5 mg once daily as needed given consistent use upon waking; can further uptitrate BuSpar in 2 to 3 days  5. Neuropsych/cognition: This patient is capable of making decisions on her own behalf. 6. Skin/Wound Care: Routine skin checks 7. Fluids/Electrolytes/Nutrition: Routine in and outs with follow-up chemistries - 6/14: Na low-stable 133 this AM; K 3.2 add K dur 20 meq BID x3 days; BUN/Cr stable 1.2-1.5 -02/12/23 Na stable at 132, K 4.1, BUN/Cr improving, will decrease Kdur to QD x2 more days for now and recheck on routine labs on Monday  - 6/17: Na stable, BUN increasing, Cr stable, K WNL - 6/18: Na stable, K WNL  8.  COPD exacerbation/tobacco use.  Continue nebulizers as advised.  Check oxygen saturations every shift. Tobacco cessation education; does not want patches.  -02/20/23 sats seem to be doing well, lungs sound good today, nose irritated from [place] tube, will order vaseline.    9.  Hypertension.  Lasix 40 mg twice daily, Imdur 60 mg daily, Cozaar 12.5 mg daily, Hydralazine 10 mg every 8 hours, Toprol-XL 25 mg daily.  Monitor with increased mobility - 6/14: Mildly low today; monitor with diuresis, if symptomatic may need cards to assist in adjusting -6/15-16/24 BPs uptrending,  monitor for now - 6/17-20: BP stable/low - may be too low with flomax 0.8 mg. Reduce to 0.4 mg; monitor for orthostasis -6/22-23/24 BP seems to be improving; monitor for trend   Vitals:   02/17/23 1240 02/17/23 2006 02/18/23 0418 02/18/23 0709  BP: (!) 99/53 108/80 139/73 (!) 144/73   02/18/23 1304 02/18/23 1951 02/18/23 2234 02/19/23 0603  BP: (!) 100/57 (!) 110/58 (!) 93/47 113/60   02/19/23 1328 02/19/23 1959 02/20/23 0343 02/20/23 0523  BP: 110/61 122/69 117/60 115/61     10.  Diastolic congestive heart failure/intermittent chest pain.  Ejection fraction 20 to 25%.  Continue diuresis.  Jardiance 25 mg daily.  Follow-up heart failure team. Daily weights.    - 6/14: Likely decreased d/t bed vs standing, monitor trend  -6/15-16/24 wt stable , cont monitoring - 6/17: Lasix 20 mg once today as below. Per HF team recs: "May be demand ischemia with CHF though wt has continued to decrease.  EKG without acute changes.  Not on tele in rehab  - could consider other CAD beside LAD on imdur 60 - could add ranexa 500 BID or increase imdur to 90 mg daily, will give another 30 mg po now then 90 daily" - 6/18-20: 1-2x daily NTG for CP; weights stable; anxiety component as above -6/22-23/24 seems to be doing better with regards to CP; wt a little lower; monitor  Filed Weights   02/18/23 0500 02/19/23 0603 02/20/23 0524  Weight: 77.5 kg 75.7 kg 76 kg      11.  AKI on CKD/hyponatremia.  Follow-up chemistries.  Entresto on hold until renal function improved. - Cr stable 1.5; has been 1.2-1.5; monitor BMP in AM d/t low BP and significant diuresis -02/12/23 Cr stable 1.38; monitor routine labs - 6/17: Cr 1.5; stable. BUN uptrending. Recheck in AM   - 6/18: Cr 1.3, BUN 67; improved  12.  Hyperlipidemia.  Lipitor 80mg  QD   13.  Type II diabetes mellitus.  Hemoglobin A1c 6.0.   - 6/14: BG stable, continue SSI and Jardiance 25mg  every day  - 6/15-23/24 CBGs stable, monitor Recent Labs    02/19/23 1222  02/19/23 1647 02/20/23 1129  GLUCAP  120* 192* 151*      14.  Constipation. Failed enema today. Passing gas. Check KUB - mild gaseous distention, small volume stool.  Adjust bowel program - Sennakot S 2 tabs BID, PRN sorbitol.   - 6/14: Small BM and smear yesterday. 1x sorbitol. Add Miralax daily to regimen. Add PRN suppository -02/12/23 LBM this morning, monitor' -02/13/23 multiple BMs overnight, reportedly black/dark colored; pt asymptomatic. H&H showing slight decrease in Hgb 11.7 (previously 12.3-12.8); will recheck H&H at 6hr mark, get fecal occult card done, and monitor closely; if continuing to have melanotic stools or worsening anemia, may need to consult GI/start protonix/stop lovenox; will monitor.  - 6/17: + occult blood x2, HgB stable, start protonix BID - 6/19: Increase miralax to BID, hard stool 6/18 - 6/22-23/24 having daily BMs now, monitor  15. Urinary retention. High PVR 700 yesterday. Check x3 days, start Flomax 0.4 mg at bedtime. - 6/19: High retention overnight, ISCx1. Increase flomax to 0.8 mg, timed toiletting at bedtime Q4H. UA ordered. -> reduced flomax d/t hypotension - 6/20: UA with rare bacteria, small LE, no Nitrites; given no apparent symptoms other than retention will not treat. Bethenachol contraindicated d/t CAD   LOS: 10 days A FACE TO FACE EVALUATION WAS PERFORMED  [address] 02/20/2023, 12:11 PM

## 2023-02-21 LAB — GLUCOSE, CAPILLARY
Glucose-Capillary: 125 mg/dL — ABNORMAL HIGH (ref 70–99)
Glucose-Capillary: 120 mg/dL — ABNORMAL HIGH (ref 70–99)
Glucose-Capillary: 125 mg/dL — ABNORMAL HIGH (ref 70–99)
Glucose-Capillary: 173 mg/dL — ABNORMAL HIGH (ref 70–99)

## 2023-02-21 LAB — CBC
HCT: 34.3 % — ABNORMAL LOW (ref 36.0–46.0)
Hemoglobin: 10.9 g/dL — ABNORMAL LOW (ref 12.0–15.0)
MCH: 29.7 pg (ref 26.0–34.0)
MCHC: 31.8 g/dL (ref 30.0–36.0)
MCV: 93.5 fL (ref 80.0–100.0)
Platelets: 190 10*3/uL (ref 150–400)
RBC: 3.67 MIL/uL — ABNORMAL LOW (ref 3.87–5.11)
RDW: 15.3 % (ref 11.5–15.5)
WBC: 5.3 10*3/uL (ref 4.0–10.5)
nRBC: 0 % (ref 0.0–0.2)

## 2023-02-21 LAB — BASIC METABOLIC PANEL
Anion gap: 10 (ref 5–15)
BUN: 49 mg/dL — ABNORMAL HIGH (ref 8–23)
CO2: 22 mmol/L (ref 22–32)
Calcium: 9.4 mg/dL (ref 8.9–10.3)
Chloride: 102 mmol/L (ref 98–111)
Creatinine, Ser: 1.37 mg/dL — ABNORMAL HIGH (ref 0.44–1.00)
GFR, Estimated: 39 mL/min — ABNORMAL LOW (ref >60)
Glucose, Bld: 143 mg/dL — ABNORMAL HIGH (ref 70–99)
Potassium: 3.8 mmol/L (ref 3.5–5.1)
Sodium: 134 mmol/L — ABNORMAL LOW (ref 135–145)

## 2023-02-21 MED ORDER — SENNOSIDES-DOCUSATE SODIUM 8.6-50 MG PO TABS
1.0000 | ORAL_TABLET | Freq: Two times a day (BID) | ORAL | Status: DC
  Administered 2023-02-21 – 2023-02-22 (×3): 1 via ORAL
  Filled 2023-02-21 (×7): qty 1

## 2023-02-21 NOTE — Progress Notes (Signed)
Physical Therapy Session Note  Patient Details  Name: Angela Lucero MRN: [id number] Date of Birth: Jan 06, 1941  Today's Date: 02/21/2023 PT Individual Time: 1100-1200 PT Individual Time Calculation (min): 60 min   Short Term Goals: Week 2:  PT Short Term Goal 1 (Week 2): Patient will initiate stair training PT Short Term Goal 2 (Week 2): Patient will ambulate x50' with LRAD and Supv PT Short Term Goal 3 (Week 2): Patient will tolerate sitting OOB in between treatment sessions  Skilled Therapeutic Interventions/Progress Updates:  Patient greeted supine in bed and agreeable to PT treatment session. Patient transitioned to sitting EOB ModI- While sitting EOB, patient donned shoes. Patient requested to use the restroom prior to start of session. Patient stood from EOB and gait trained to/from her bed and the bathroom with RW and SBA- Patient was able to tolerate ambulation without oxygen donned. Patient managed underwear and pericare with distant supv. Patient wheeled to/from main gym and her room for time management and energy conservation.   Patient ascended/descended x4 steps with L HR and CGA/MinA for safety and stability- VC for ascending with RLE and descending with LLE leading. Patient then ambulated ~20' to a chair which simulated where her car would be parked at home since patient denied wanting a wheelchair or transport chair.   Patient gait trained x45', x50' and x55' with RW and SBA/Supv for safety- Therapist managing oxygen tank and line throughout gait trials. Minor VC for improved postural extension with good improvements noted, however limited secondary to impaired posturing and Lt knee pain.   Patient returned to her room and ambulated to bedside recliner with RW and Supv- Patient left sitting in bedside recliner with call bell within reach, tray table in front and all needs met.    Therapy Documentation Precautions:  Precautions Precautions: Fall Precaution Comments: Monitor SpO2,  L knee pain Restrictions Weight Bearing Restrictions: No  Pain: Lt knee pain, however denies wanting cream or more pain medicine to help improve pain/discomfort.    Therapy/Group: Individual Therapy  Charlestine Rookstool 02/21/2023, 7:53 AM

## 2023-02-21 NOTE — Progress Notes (Signed)
Occupational Therapy Session Note  Patient Details  Name: Angela Lucero MRN: [id number] Date of Birth: 01/14/1941  Today's Date: 02/21/2023 Session 1 OT Individual Time: 0850-1000 OT Individual Time Calculation (min): 70 min   Session 2 OT Individual Time: 1300-1415 OT Individual Time Calculation (min): 75 min    Short Term Goals: Week 2:  OT Short Term Goal 1 (Week 2): LTG=STG 2/2 ELOS  Skilled Therapeutic Interventions/Progress Updates:  Session 1   Pt greeted semi-reclined in bed and agreeable to OT treatment session with encouragement, but more agreeable than previous sessions. Pt on 2L of O2 throughout session. Pt declined participating in any bathing/dressing tasks. She then completed bed mobility with HOB elevated and supervision.She donned pants at EOB- and was able to thread them with encouragement. Pt then able to get on sliip on shoes w/ set-up A. Pt stood w/ RW and CGA, Pt ambulated 5 feet w/ RW and close supervision. Pt brought to therapy gym for UB strength and endurance using [employer] arm bike. Pt completed 6 minutes with 3 extended rest breaks to get to 6 mins. Pt then ambulated 20 feet in hallway with 1 standing rest break and RW w/ close supervision. Pt reported max fatigue and need to use the bathroom. Pt returned to room and ambulated into bathroom. Worked on toileting and self-care tasks incorporating leaning method to cleanse after voiding to conserve energy  Pt sate EOB and doffed pants with CGA. Pt returned to bed and left semi-reclined in bed with bed alarm on, call bell in reach,and needs met.   Session 2 Pt greeted seated in recliner and was able to sit up after PT session and through lunch. Pt declined participating in BADL tasks. OT educated on importance of trying a shower prior to dc to gauge activity tolerance within functional task. Pt agreeable to try this week. Pt reported she had broken finger nail and tried to use her nail clippers, but grip strength not strong  enough to pinch and cut nails, requiring OT assist.  Pt reported need to go to the bathroom w/ RW and close supervision. Pt voided bladder and needed set-up A to change pad. Pt stood for peri-care with CGA/set-up A. Pt tolerated standing up at the sink to wash hands prior to seated rest break. Pt  brought to therapy gym and addressed standing balance/endurance standing on foam block, while participating in small peg board activity. Pt tolerated standing for 2.5 minutes before reaching max fatigue. UB there-ex using 2 lb weighted bar. 3 sets of 10 chest press, bicep curl, and straight arm riase. LB there-ex with 3 sets of 10 seated cone tap, hip adduction with ball squeze, and hip abduction with yellow theraband. Pt returned to room and ambulated 10 feet back to bed w/ RW and close supervision. Pt left semi-reclined in bed with bed alarm on, call bell in reach, and needs met.  Therapy Documentation Precautions:  Precautions Precautions: Fall Precaution Comments: Monitor SpO2, L knee pain Restrictions Weight Bearing Restrictions: No General:   Vital Signs:   Pain: Pain Assessment Pain Scale: 0-10 Pain Score: 5  ADL: ADL Eating: Supervision/safety Where Assessed-Eating: Edge of bed Grooming: Supervision/safety Where Assessed-Grooming: Sitting at sink Upper Body Bathing: Supervision/safety Where Assessed-Upper Body Bathing: Edge of bed Lower Body Bathing: Moderate assistance Where Assessed-Lower Body Bathing: Sitting at sink, Standing at sink Upper Body Dressing: Supervision/safety Where Assessed-Upper Body Dressing: Edge of bed Lower Body Dressing: Moderate assistance Where Assessed-Lower Body Dressing: Standing at sink, Sitting at  sink Toileting: Moderate assistance Where Assessed-Toileting: Bedside Commode Toilet Transfer: Distant supervision Tub/Shower Transfer: Not assessed Film/video editor: Not assessed Vision   Perception    Praxis   Balance   Exercises:    Other Treatments:     Therapy/Group: Individual Therapy  Mal Amabile 02/21/2023, 2:21 PM

## 2023-02-21 NOTE — Progress Notes (Signed)
PROGRESS NOTE   Subjective/Complaints:  No events overnight. Vitals stable Getting PRN xanax, hasn't needed Nitroglycerine since 6/21   ROS:  + Chest pain/angina - intermittent, intermittently reproducible on palpation, responsive to nitroglycerin and Xanax- currently improved + abdominal cramping/constipation - resolved + generalized weakness - ongoing + Urinary retention -improved Denies fevers, chills, SOB, N/V, abdominal pain, diarrhea, cough, chest pain, new weakness or paraesthesias.   As per HPI  Objective:   No results found. Recent Labs    02/21/23 0554  WBC 5.3  HGB 10.9*  HCT 34.3*  PLT 190    Recent Labs    02/21/23 0554  NA 134*  K 3.8  CL 102  CO2 22  GLUCOSE 143*  BUN 49*  CREATININE 1.37*  CALCIUM 9.4     Intake/Output Summary (Last 24 hours) at 02/21/2023 1610 Last data filed at 02/21/2023 0802 Gross per 24 hour  Intake 1195 ml  Output --  Net 1195 ml         Physical Exam: Vital Signs Blood pressure 129/75, pulse 100, temperature 98 F (36.7 C), temperature source Oral, resp. rate 18, height 5\' 6"  (1.676 m), weight 75.9 kg, SpO2 100 %. Physical Exam Constitutional: No apparent distress. Appropriate appearance for age. +Obese. Sitting in bedside chair HENT: No JVD. Neck Supple. Trachea midline. Atraumatic, normocephalic. Eyes: PERRLA. EOMI. Visual fields grossly intact.  Cardiovascular: RRR no murmurs/rub/gallops.  Trace pitting edema isolated to the bilateral feet/ankles. Peripheral pulses 2+  Respiratory: CTAB.  No rales, rhonchi, or wheezing.  + 2 L [place] Abdomen: + Large hernia/diastasis, normoactive BS throughout. Soft and NTND.  Skin: C/D/I. No apparent lesions. Chronic skin changes on B/L dorsal feet. +BL UE bruising.   PRIOR EXAMS: MSK:          Strength: antigravity and against resistance all 4 extremities; 4/5   No further TTP over left chest, sternum  Neurologic  exam:  Cognition: AAO to person, place, time and event.  Insight: Good  insight into current condition.  Sensation: To light touch intact in BL UEs and LEs  Reflexes: 2+ in BL UE and LEs. CN: 2-12 grossly intact.    Assessment/Plan: 1. Functional deficits which require 3+ hours per day of interdisciplinary therapy in a comprehensive inpatient rehab setting. Physiatrist is providing close team supervision and 24 hour management of active medical problems listed below. Physiatrist and rehab team continue to assess barriers to discharge/monitor patient progress toward functional and medical goals  Care Tool:  Bathing  Bathing activity did not occur: Refused Body parts bathed by patient: Chest, Right arm, Left arm, Abdomen, Front perineal area, Right upper leg, Left upper leg, Face   Body parts bathed by helper: Left lower leg, Right lower leg, Buttocks     Bathing assist Assist Level: Moderate Assistance - Patient 50 - 74%     Upper Body Dressing/Undressing Upper body dressing   What is the patient wearing?: Dress    Upper body assist Assist Level: Supervision/Verbal cueing    Lower Body Dressing/Undressing Lower body dressing      What is the patient wearing?: Pants     Lower body assist Assist for lower body  dressing: Moderate Assistance - Patient 50 - 74%     Toileting Toileting    Toileting assist Assist for toileting: Minimal Assistance - Patient > 75%     Transfers Chair/bed transfer  Transfers assist     Chair/bed transfer assist level: Contact Guard/Touching assist     Locomotion Ambulation   Ambulation assist      Assist level: Minimal Assistance - Patient > 75% Assistive device: Walker-rolling Max distance: 10'   Walk 10 feet activity   Assist     Assist level: Minimal Assistance - Patient > 75% Assistive device: Walker-rolling   Walk 50 feet activity   Assist Walk 50 feet with 2 turns activity did not occur: Safety/medical  concerns (Unable to ambulate >10' or perform stair mobility secondary to global deconditioning)         Walk 150 feet activity   Assist Walk 150 feet activity did not occur: Safety/medical concerns         Walk 10 feet on uneven surface  activity   Assist Walk 10 feet on uneven surfaces activity did not occur: Safety/medical concerns         Wheelchair     Assist Is the patient using a wheelchair?: Yes Type of Wheelchair: Manual Wheelchair activity did not occur:  (Therapist dependently propelling manual wheelchair secondary to fatigue)  Wheelchair assist level: Dependent - Patient 0% Max wheelchair distance: 150    Wheelchair 50 feet with 2 turns activity    Assist        Assist Level: Dependent - Patient 0%   Wheelchair 150 feet activity     Assist      Assist Level: Dependent - Patient 0%   Blood pressure 129/75, pulse 100, temperature 98 F (36.7 C), temperature source Oral, resp. rate 18, height 5\' 6"  (1.676 m), weight 75.9 kg, SpO2 100 %.  Medical Problem List and Plan: 1. Functional deficits secondary to debility/CHF/acute hypoxic respiratory failure             -patient may shower             -ELOS/Goals: 10-14 days, SPV goals PT/OT - 6/28 - 6/14: D/w family, SW plan for discharge to hospice nursing after IPR; no referrals sent while in acute. Family working with Palliative for OP referral; will let us know if they need anything.   - 6/17: RR overnight for chest pain, Sob/tachypnea. Troponins stable 30s, EKG no STEMI. Mild weight increase, mild vascular congestion on CXR improved from last exam. Give lasix 20 mg x1, consult HF team. Responsive to PRN nitroglycerin.  - 6/18: Per son, will need to be intermittent SPV at home with neighbors; patient will be paying caregiver every other day and having her call on off days. Per therapies, self-limiting but is improving. With PT today CGA 13 ft, stair and car transfer goals at SPV and all otehrs  Mod I goals.  - 6/20: Make 15/7 for recurrent CP, SOB  2.  Antithrombotics: -DVT/anticoagulation:  Pharmaceutical: Lovenox 30mg  QD             -antiplatelet therapy: Aspirin 81 mg daily 3. Pain Management: Tramadol as needed 4. Mood/Behavior/Sleep: Melatonin 5 mg nightly--sleeping well, cont regimen             -antipsychotic agents: N/A - 6/18: Start Buspar 7.5 mg BID for anxiety, Xanax 0.25 mg BID PRN for severe episodes while uptitrating - 6/20: responsive to PRN xanax, ongoing anxiety/recurrent CP/SOB, increase Buspar to 7.5 mg  TID  - 6/21: Adjust Xanax to 0.5 mg once daily as needed given consistent use upon waking; can further uptitrate BuSpar in 2 to 3 days  5. Neuropsych/cognition: This patient is capable of making decisions on her own behalf. 6. Skin/Wound Care: Routine skin checks 7. Fluids/Electrolytes/Nutrition: Routine in and outs with follow-up chemistries - 6/14: Na low-stable 133 this AM; K 3.2 add K dur 20 meq BID x3 days; BUN/Cr stable 1.2-1.5 -02/12/23 Na stable at 132, K 4.1, BUN/Cr improving, will decrease Kdur to QD x2 more days for now and recheck on routine labs on Monday  - 6/17: Na stable, BUN increasing, Cr stable, K WNL - 6/18: Na stable, K WNL  8.  COPD exacerbation/tobacco use.  Continue nebulizers as advised.  Check oxygen saturations every shift. Tobacco cessation education; does not want patches.  -02/20/23 sats seem to be doing well, lungs sound good today, nose irritated from Broxton tube, will order vaseline.    9.  Hypertension.  Lasix 40 mg twice daily, Imdur 60 mg daily, Cozaar 12.5 mg daily, Hydralazine 10 mg every 8 hours, Toprol-XL 25 mg daily.  Monitor with increased mobility - 6/14: Mildly low today; monitor with diuresis, if symptomatic may need cards to assist in adjusting -6/15-16/24 BPs uptrending, monitor for now - 6/17-20: BP stable/low - may be too low with flomax 0.8 mg. Reduce to 0.4 mg; monitor for orthostasis -6/22-23/24 BP seems to be  improving; monitor for trend   Vitals:   02/18/23 1304 02/18/23 1951 02/18/23 2234 02/19/23 0603  BP: (!) 100/57 (!) 110/58 (!) 93/47 113/60   02/19/23 1328 02/19/23 1959 02/20/23 0343 02/20/23 0523  BP: 110/61 122/69 117/60 115/61   02/20/23 1256 02/20/23 1919 02/20/23 2125 02/21/23 0550  BP: (!) 105/54 128/64 (!) 98/48 129/75     10.  Diastolic congestive heart failure/intermittent chest pain.  Ejection fraction 20 to 25%.  Continue diuresis.  Jardiance 25 mg daily.  Follow-up heart failure team. Daily weights.    - 6/14: Likely decreased d/t bed vs standing, monitor trend  -6/15-16/24 wt stable , cont monitoring - 6/17: Lasix 20 mg once today as below. Per HF team recs: "May be demand ischemia with CHF though wt has continued to decrease.  EKG without acute changes.  Not on tele in rehab  - could consider other CAD beside LAD on imdur 60 - could add ranexa 500 BID or increase imdur to 90 mg daily, will give another 30 mg po now then 90 daily" - 6/18-20: 1-2x daily NTG for CP; weights stable; anxiety component as above -6/22-23/24 seems to be doing better with regards to CP; wt a little lower; monitor  Filed Weights   02/19/23 0603 02/20/23 0524 02/21/23 0509  Weight: 75.7 kg 76 kg 75.9 kg      11.  AKI on CKD/hyponatremia.  Follow-up chemistries.  Entresto on hold until renal function improved. - Cr stable 1.5; has been 1.2-1.5; monitor BMP in AM d/t low BP and significant diuresis -02/12/23 Cr stable 1.38; monitor routine labs - 6/17: Cr 1.5; stable. BUN uptrending. Recheck in AM   - 6/18: Cr 1.3, BUN 67; improved  12.  Hyperlipidemia.  Lipitor 80mg  QD   13.  Type II diabetes mellitus.  Hemoglobin A1c 6.0.   - 6/14: BG stable, continue SSI and Jardiance 25mg  every day  - 6/15-23/24 CBGs stable, monitor Recent Labs    02/20/23 1645 02/20/23 2100 02/21/23 0553  GLUCAP 164* 108* 125*  14.  Constipation. Failed enema today. Passing gas. Check KUB - mild gaseous  distention, small volume stool.  Adjust bowel program - Sennakot S 2 tabs BID, PRN sorbitol.   - 6/14: Small BM and smear yesterday. 1x sorbitol. Add Miralax daily to regimen. Add PRN suppository -02/12/23 LBM this morning, monitor' -02/13/23 multiple BMs overnight, reportedly black/dark colored; pt asymptomatic. H&H showing slight decrease in Hgb 11.7 (previously 12.3-12.8); will recheck H&H at 6hr mark, get fecal occult card done, and monitor closely; if continuing to have melanotic stools or worsening anemia, may need to consult GI/start protonix/stop lovenox; will monitor.  - 6/17: + occult blood x2, HgB stable, start protonix BID - 6/19: Increase miralax to BID, hard stool 6/18 - 6/22-23/24 having daily BMs now, monitor - 6.24: multiple Bms overnight; reduce sennakot S to 1 tab QHS  15. Urinary retention. High PVR 700 yesterday. Check x3 days, start Flomax 0.4 mg at bedtime. - 6/19: High retention overnight, ISCx1. Increase flomax to 0.8 mg, timed toiletting at bedtime Q4H. UA ordered. -> reduced flomax d/t hypotension - 6/20: UA with rare bacteria, small LE, no Nitrites; given no apparent symptoms other than retention will not treat. Bethenachol contraindicated d/t CAD - no PVRs recently; rescan   LOS: 11 days A FACE TO FACE EVALUATION WAS PERFORMED  Angelina Sheriff 02/21/2023, 8:32 AM

## 2023-02-21 NOTE — Discharge Summary (Signed)
Physician Discharge Summary  Patient ID: Angela Lucero MRN: [id number] DOB/AGE: 81-Dec-1942 81 y.o.  Admit date: 02/10/2023 Discharge date: 02/25/2023  Discharge Diagnoses:  Principal Problem:   Debility Active Problems:   Melanotic stools   Anemia   Coping style affecting medical condition DVT prophylaxis Mood stabilization Hypertension COPD/exacerbation/tobacco use Diastolic congestive heart failure AKI on CKD/hyponatremia Hyperlipidemia Diabetes mellitus Constipation Failure to thrive  Discharged Condition: Stable  Significant Diagnostic Studies: DG Chest Port 1 View  Result Date: 02/14/2023 CLINICAL DATA:  Chest pain EXAM: PORTABLE CHEST 1 VIEW COMPARISON:  02/04/2023 FINDINGS: Cardiac shadow is enlarged. Aortic calcifications are again seen. Lungs demonstrate no focal infiltrate. Mild vascular congestion is seen. No bony abnormality is noted. IMPRESSION: Mild vascular congestion without focal infiltrate. Electronically Signed   By: Alcide Clever M.D.   On: 02/14/2023 02:00   DG Abd 1 View  Result Date: 02/10/2023 CLINICAL DATA:  Nausea. EXAM: ABDOMEN - 1 VIEW COMPARISON:  CT 09/30/2015 FINDINGS: Divided portable supine views of the abdomen obtained. Mild gaseous gastric distension. No small bowel dilatation or obstruction. Small volume of formed colonic stool. Cholecystectomy clips in the right upper quadrant. No evidence of radiopaque calculi or soft tissue calcifications. The low pelvis is excluded from the field of view. Scoliosis in the spine. IMPRESSION: Mild gaseous gastric distension, nonspecific, can be seen with gastritis. Otherwise normal bowel gas pattern. Electronically Signed   By: Narda Rutherford M.D.   On: 02/10/2023 15:46   ECHOCARDIOGRAM COMPLETE  Result Date: 02/05/2023    ECHOCARDIOGRAM REPORT   Patient Name:   Angela Lucero  Date of Exam: 02/05/2023 Medical Rec #:  [id number]  Height:       66.0 in Accession #:    [id number] Weight:       184.1 lb Date of Birth:   [birth year]  BSA:          1.931 m Patient Age:    82 years   BP:           127/77 mmHg Patient Gender: F          HR:           93 bpm. [facility] Procedure: 2D Echo, Cardiac Doppler and Color Doppler Indications:    Congestive Heart Failure I50.9  History:        Patient has prior history of Echocardiogram examinations, most                 recent 12/15/2021. CHF and Cardiomyopathy, CAD and Previous                 Myocardial Infarction, COPD; Risk Factors:Hypertension, Diabetes                 and Dyslipidemia.  Sonographer:    Celesta Gentile RCS Referring Phys: [id number] JULIE MACHEN IMPRESSIONS  1. Left ventricular ejection fraction, by estimation, is 20 to 25%. The left ventricle has severely decreased function. The left ventricle demonstrates global hypokinesis. The left ventricular internal cavity size was mildly dilated. Indeterminate diastolic filling due to E-A fusion.  2. Right ventricular systolic function is normal. The right ventricular size is mildly enlarged. There is moderately elevated pulmonary artery systolic pressure. The estimated right ventricular systolic pressure is 49.1 mmHg.  3. Left atrial size was severely dilated.  4. Right atrial size was mildly dilated.  5. The mitral valve is abnormal. Severe mitral valve regurgitation. No evidence of mitral stenosis.  6. The aortic valve is  tricuspid. Aortic valve regurgitation is not visualized. Aortic valve sclerosis/calcification is present, without any evidence of aortic stenosis.  7. The inferior vena cava is dilated in size with <50% respiratory variability, suggesting right atrial pressure of 15 mmHg. Comparison(s): Changes from prior study are noted. The left ventricular function is significantly worse. Severe MR is now present. FINDINGS  Left Ventricle: Left ventricular ejection fraction, by estimation, is 20 to 25%. The left ventricle has severely decreased function. The left ventricle demonstrates global hypokinesis. Definity  contrast agent was given IV to delineate the left ventricular endocardial borders. The left ventricular internal cavity size was mildly dilated. There is no left ventricular hypertrophy. Indeterminate diastolic filling due to E-A fusion. Right Ventricle: The right ventricular size is mildly enlarged. No increase in right ventricular wall thickness. Right ventricular systolic function is normal. There is moderately elevated pulmonary artery systolic pressure. The tricuspid regurgitant velocity is 2.92 m/s, and with an assumed right atrial pressure of 15 mmHg, the estimated right ventricular systolic pressure is 49.1 mmHg. Left Atrium: Left atrial size was severely dilated. Right Atrium: Right atrial size was mildly dilated. Pericardium: There is no evidence of pericardial effusion. Mitral Valve: The mitral valve is abnormal. Severe mitral valve regurgitation. No evidence of mitral valve stenosis. MV peak gradient, 8.1 mmHg. The mean mitral valve gradient is 3.0 mmHg. Tricuspid Valve: The tricuspid valve is grossly normal. Tricuspid valve regurgitation is mild . No evidence of tricuspid stenosis. Aortic Valve: The aortic valve is tricuspid. Aortic valve regurgitation is not visualized. Aortic valve sclerosis/calcification is present, without any evidence of aortic stenosis. Aortic valve mean gradient measures 2.0 mmHg. Aortic valve peak gradient measures 3.2 mmHg. Aortic valve area, by VTI measures 3.09 cm. Pulmonic Valve: The pulmonic valve was grossly normal. Pulmonic valve regurgitation is trivial. No evidence of pulmonic stenosis. Aorta: The aortic root is normal in size and structure. Venous: The inferior vena cava is dilated in size with less than 50% respiratory variability, suggesting right atrial pressure of 15 mmHg. IAS/Shunts: The atrial septum is grossly normal.  LEFT VENTRICLE PLAX 2D LVIDd:         6.10 cm LVIDs:         5.10 cm LV PW:         0.90 cm LV IVS:        0.80 cm LVOT diam:     1.90 cm LV SV:          48 LV SV Index:   25 LVOT Area:     2.84 cm  LV Volumes (MOD) LV vol d, MOD A2C: 148.0 ml LV vol d, MOD A4C: 218.0 ml LV vol s, MOD A2C: 107.0 ml LV vol s, MOD A4C: 150.0 ml LV SV MOD A2C:     41.0 ml LV SV MOD A4C:     218.0 ml LV SV MOD BP:      52.8 ml RIGHT VENTRICLE RV S prime:     10.80 cm/s TAPSE (M-mode): 2.4 cm LEFT ATRIUM            Index        RIGHT ATRIUM           Index LA diam:      4.80 cm  2.49 cm/m   RA Area:     24.10 cm LA Vol (A2C): 113.0 ml 58.53 ml/m  RA Volume:   84.80 ml  43.93 ml/m LA Vol (A4C): 105.0 ml 54.39 ml/m  AORTIC VALVE AV Area (  Vmax):    2.96 cm AV Area (Vmean):   2.61 cm AV Area (VTI):     3.09 cm AV Vmax:           89.40 cm/s AV Vmean:          68.800 cm/s AV VTI:            0.157 m AV Peak Grad:      3.2 mmHg AV Mean Grad:      2.0 mmHg LVOT Vmax:         93.40 cm/s LVOT Vmean:        63.350 cm/s LVOT VTI:          0.171 m LVOT/AV VTI ratio: 1.09  AORTA Ao Root diam: 3.40 cm MITRAL VALVE                  TRICUSPID VALVE MV Area (PHT): 4.46 cm       TR Peak grad:   34.1 mmHg MV Area VTI:   1.53 cm       TR Vmax:        292.00 cm/s MV Peak grad:  8.1 mmHg MV Mean grad:  3.0 mmHg       SHUNTS MV Vmax:       1.42 m/s       Systemic VTI:  0.17 m MV Vmean:      74.3 cm/s      Systemic Diam: 1.90 cm MV Decel Time: 170 msec MR Peak grad:    94.5 mmHg MR Mean grad:    45.0 mmHg MR Vmax:         486.00 cm/s MR Vmean:        298.0 cm/s MR PISA:         5.09 cm MR PISA Eff ROA: 24 mm MR PISA Radius:  0.90 cm MV E velocity: 124.00 cm/s Lennie Odor MD Electronically signed by Lennie Odor MD Signature Date/Time: 02/05/2023/3:35:07 PM    Final    DG Chest Port 1 View  Result Date: 02/04/2023 CLINICAL DATA:  coughm, sob, copd, chf EXAM: PORTABLE CHEST 1 VIEW COMPARISON:  CXR 12/13/21 FINDINGS: The heart size and mediastinal contours are within normal limits. Aortic atherosclerotic calcifications. No pleural effusion. No pneumothorax. Hazy bibasilar airspace opacities  could represent atelectasis or infection. The visualized skeletal structures are unremarkable. IMPRESSION: Hazy bibasilar airspace opacities could represent atelectasis or infection. Electronically Signed   By: Lorenza Cambridge M.D.   On: 02/04/2023 12:15    Labs:  Basic Metabolic Panel: Recent Labs  Lab 02/21/23 0554  NA 134*  K 3.8  CL 102  CO2 22  GLUCOSE 143*  BUN 49*  CREATININE 1.37*  CALCIUM 9.4    CBC: Recent Labs  Lab 02/21/23 0554  WBC 5.3  HGB 10.9*  HCT 34.3*  MCV 93.5  PLT 190    CBG: Recent Labs  Lab 02/23/23 2143 02/24/23 0613 02/24/23 1149 02/24/23 1649 02/24/23 2200  GLUCAP 132* 128* 152* 155* 145*   Family history.  Mother with CAD as well as hypertension.  Father with hypertension.  Denies any colon cancer esophageal cancer or rectal cancer  Brief HPI:   Angela Lucero is a 82 y.o. right-handed female with history of CAD with left heart cath 12/14/2021, diastolic congestive heart failure with ejection fraction of 30 to 35% maintained on aspirin, COPD with tobacco use, CVA 1997 without residual deficits.  CKD stage III, hypertension, hyperlipidemia, type 2 diabetes mellitus.  Per  chart review lives alone independent prior to admission.  Presented 02/04/2023 with progressive shortness of breath as well as sharp acute chest pain.  Admission chemistries BNP 3951 troponin 147-136, sodium 122, glucose 43 BUN 45 creatinine 1.89 lactic acid within normal limits EKG did not show any evidence of acute ischemia.  Patient did receive 2 nitroglycerin tablets and chest pain subsided.  Chest x-ray hazy bibasilar airspace opacities representing atelectasis and/or infection.  Echocardiogram with ejection fraction of 20 to 25%.  Global hypokinesis.  Cardiology consulted for acute on chronic systolic congestive heart failure with conservative care and presently maintained on Jardiance as well as Lasix.  Patient's Entresto had remained on hold for AKI.  Lovenox added for DVT prophylaxis.   Patient initially had oxygen demand and slowly weaned from oxygen.  Mild elevation in creatinine to 1.81 felt to be related to diuresis and monitor closely.  Hyponatremia 122-126.  Palliative care consulted to establish goals of care.  Therapy evaluations completed due to patient decreased functional mobility was admitted for a comprehensive rehab program   Hospital Course: Angela Lucero was admitted to rehab 02/10/2023 for inpatient therapies to consist of PT, ST and OT at least three hours five days a week. Past admission physiatrist, therapy team and rehab RN have worked together to provide customized collaborative inpatient rehab.  Pertaining to patient's debility related to CHF/acute hypoxic respiratory failure.  Patient slow to progress with therapies.  Therapies had been changed to 15/7.  Intermittent bouts of chest pain troponins remain stable in the 30s EKG no STEMI noted.  Close monitoring for any fluid overload.  Lovenox for DVT prophylaxis as well as remaining on low-dose aspirin.  Mood stabilization with melatonin for sleep started on BuSpar twice daily for anxiety Xanax as needed.  COPD exacerbation with history of tobacco use monitoring of oxygen saturations weaning oxygen as possible was discussed at length no smoking.  She refused any NicoDerm patch.  Blood pressure controlled with Imdur as well as Cozaar with hydralazine and Toprol with close monitoring blood pressure remains soft.  Patient's Sherryll Burger remains on hold.  Palliative care continue to follow for goals of care discussion for home hospice.  Acute on chronic anemia hemoglobin 10.9 no other bleeding episodes.  Patient did initially have some episodes of urinary retention with PVRs as high as 700 that did improve with the addition of Flomax.  Type 2 diabetes mellitus hemoglobin A1c 6.0 Jardiance as advised blood sugars overall controlled.   Blood pressures were monitored on TID basis and soft and monitored  Diabetes has been monitored  with ac/hs CBG checks and SSI was use prn for tighter BS control.    Rehab course: During patient's stay in rehab weekly team conferences were held to monitor patient's progress, set goals and discuss barriers to discharge. At admission, patient required minimal assist 3 feet rolling walker minimal assist step pivot transfers  Physical exam.  Blood pressure 106/63 pulse 80 temperature 97.8 respirations 18 oxygen saturation is 96% room air Constitutional.  Frail alert elderly female HEENT Head.  Normocephalic and atraumatic Eyes.  Pupils round and reactive to light no discharge without nystagmus Neck.  Supple nontender no JVD without thyromegaly Cardiac regular rate and rhythm without any extra sounds or murmur heard Abdomen.  Soft nontender positive bowel sounds without rebound Respiratory.  Decreased bilaterally fine crackles at the bases Musculoskeletal Strength.  Right upper extremity 5/5 Left upper extremity 5/5 Bilateral lower extremities 4-5/5  He/She  has had improvement in activity  tolerance, balance, postural control as well as ability to compensate for deficits. He/She has had improvement in functional use RUE/LUE  and RLE/LLE as well as improvement in awareness.  Working with energy conservation techniques.  Patient stood from edge of bed ambulates to and from her bed to the bathroom with rolling walker standby assist.  A Saenz and descends 4 steps contact-guard/minimal assist.  She can increase her ambulation up to 55 feet rolling walker standby assist.  Patient has progressed with overall mobility and ambulating distances with her rolling walker.  She still needs some assist for lower body ADLs.  Full family teaching completed plan discharge to home       Disposition: Discharge to home    Diet: Regular  Special Instructions: No driving smoking or alcohol  2 L nasal cannula oxygen therapy as needed shortness of breath  Medications at discharge 1.  Tylenol as needed 2.   Xanax 0.5 mg p.o. daily as needed 3.  Aspirin 81 mg p.o. daily 4.  Lipitor 80 mg p.o. daily 5.  BuSpar 7.5 mg p.o. 3 times daily 6.  Jardiance 25 mg p.o. daily 7.  Lasix 40 mg p.o. twice daily 8.  Hydralazine 10 mg every 8 hours 9.  Imdur 90 mg p.o. daily 10.  Cozaar 12.5 mg p.o. daily 11.  Melatonin 5 mg p.o. nightly 12.  Toprol-XL 25 mg p.o. daily 13.  Nitroglycerin as needed 14.  Protonix 40 mg p.o. twice daily 15.  MiraLAX  daily as needed hold for loose stools 16.  Flomax 0.4 mg p.o. daily 17.  Tramadol 50 mg p.o. every 6 hours as needed pain 18.  Ellipta 62.5-25 mcg 1 puff daily 19.  Albuterol inhaler 2 puffs every 6 hours as needed shortness of breath 20.  Vitamin D nightly 21.  Multivitamin daily 22.  Tylenol as needed 23.  Omega-3 2 capsules mouth in the morning and bedtime 24.  Nitroglycerin as needed 30-35 minutes were spent completing discharge summary and discharge planning     Follow-up Information     Angelina Sheriff, DO Follow up.   Specialty: Physical Medicine and Rehabilitation Why: no formal follow up needed Contact information: [address] [place] [address] [phone]         Georgeanna Lea, MD Follow up.   Specialty: Cardiology Why: call for appointment Contact information: [address] [place] [address] [phone]                 Signed: Charlton Amor 02/25/2023, 4:50 AM

## 2023-02-21 NOTE — Progress Notes (Signed)
Slept well last night. No new changes to document. Continuous on oxygen therapy.. Safety maintained.

## 2023-02-22 DIAGNOSIS — F54 Psychological and behavioral factors associated with disorders or diseases classified elsewhere: Secondary | ICD-10-CM

## 2023-02-22 LAB — GLUCOSE, CAPILLARY
Glucose-Capillary: 150 mg/dL — ABNORMAL HIGH (ref 70–99)
Glucose-Capillary: 92 mg/dL (ref 70–99)
Glucose-Capillary: 92 mg/dL (ref 70–99)
Glucose-Capillary: 105 mg/dL — ABNORMAL HIGH (ref 70–99)
Glucose-Capillary: 232 mg/dL — ABNORMAL HIGH (ref 70–99)

## 2023-02-22 NOTE — Progress Notes (Signed)
Patient ID: Angela Lucero, female   DOB: 29-Jan-1941, 82 y.o.   MRN: [id number]  SW met with pt and pt son in room to provide updates from team conference on gains made, and d/c date 6/28. Pt sitting without oxygen. She is aware SW is waiting on confirmation on if home o2 will be needed. Amenable to therapies at [facility]. SW will confirm.   Cecile Sheerer, MSW, LCSWA Office: [phone] Cell: [phone] Fax: [phone]

## 2023-02-22 NOTE — Progress Notes (Signed)
Occupational Therapy Session Note  Patient Details  Name: Angela Lucero MRN: [id number] Date of Birth: 1941-04-28  Today's Date: 02/22/2023 OT Individual Time: 1020-1048 OT Individual Time Calculation (min): 28 min    Short Term Goals: Week 2:  OT Short Term Goal 1 (Week 2): LTG=STG 2/2 ELOS  Skilled Therapeutic Interventions/Progress Updates:  Pt received resting in bed for skilled OT session with focus on toileting and functional mobility as a means of targeting activity tolerance needed for ADL participation. Pt agreeable to interventions, demonstrating overall pleasant mood. Pt with un-rated chronic knee pain, stating "it's at a million." OT offering intermediate rest breaks and positioning suggestions throughout session to address pain/fatigue and maximize participation/safety in session.   Pt comes to EOB with supervision + HOB elevated, donning shoes with item-retrieval. Pt performs all functional transfers with CGA + RW this session, ambulating into/out bathroom with the same level of assistance. Pt completes 3/3 toileting activities with supervision + RW, using forward to back technique for peri-care.  Pt then performs house-hold level mobility towards nurses station, requiring x4 seated rest breaks during ambulation towards and from target, O2 measured throughout, ranging 97-98 with no O2. Pt very pleased with progress and being weaned off of oxygen.   Pt remained resting in bed with all immediate needs met at end of session. Pt continues to be appropriate for skilled OT intervention to promote further functional independence.   Therapy Documentation Precautions:  Precautions Precautions: Fall Precaution Comments: Monitor SpO2, L knee pain Restrictions Weight Bearing Restrictions: No   Therapy/Group: Individual Therapy  Lou Cal, OTR/L, MSOT  02/22/2023, 6:23 AM

## 2023-02-22 NOTE — Progress Notes (Signed)
Physical Therapy Session Note  Patient Details  Name: Angela Lucero MRN: [id number] Date of Birth: 08/25/1941  Today's Date: 02/22/2023 PT Individual Time: 1st Treatment Session: 1100-1200; 2nd Treatment Session: 1430 -1530 PT Individual Time Calculation (min): 60 min; 60 min   Short Term Goals: Week 2:  PT Short Term Goal 1 (Week 2): Patient will initiate stair training PT Short Term Goal 2 (Week 2): Patient will ambulate x50' with LRAD and Supv PT Short Term Goal 3 (Week 2): Patient will tolerate sitting OOB in between treatment sessions  Skilled Therapeutic Interventions/Progress Updates: 1st Treatment Session-  Patient greeted supine in bed and agreeable to PT treatment session. Patient presented without oxygen on and saturating around 98% at rest. Patient transitioned to sitting EOB ModI where she was able to don shoes with set-up assistance. Patient stood from EOB and transferred to the wheelchair with RW and Supv for safety. Patient wheeled to/from her room and day room for time management and energy conservation.   Patient gait trained x55' with RW and Supv for safety- Patient completed entire gait trial on RA with minor VC for pursed lip breathing throughout. Patient 98% at rest on RA and after gait trial 93-94% for two minutes and then 96-98% for two minutes and then back to baseline and >98%.   Patient gait trained another x23' and x71' with RW and supv for safety- Patient on RA and maintained oxygen >98% throughout gait trial with 98-100% while resting post gait trial. Extended seated rest break required in between gait trials secondary to fatigue and impaired endurance/activity tolerance.   Physician present for morning rounds and patient discussed Lt knee pain- Pain medication adjustments to be made.   Patient returned to her room and requesting to use the restroom- Patient gait trained to/from the bathroom with RW and Supv. Patient with continent void and was able to perform  pericare and clothing management with distant supv. Therapist applied barrier cream to her bottom- NT notified regarding void and barrier cream and agreeable to charting.   Patient transitioned from sitting EOB to supine ModI- Patient assisted to comfort and left supine in bed with bed alarm on, call bell within reach and all needs met.    2nd Treatment Session- Patient greeted sitting upright in wheelchair in room with son, Trey Paula, present for family training in order to ensure a safe discharge home- Patient and son both agreeable to PT treatment session. Patient wheeled to/from her room and ortho gym for time management and energy conservation.   Patient performed various sit/stands, transfers, car transfer, ascended/descended a low grade ramp and gait training (~50') with RW and supv for safety- Patient required multiple rest breaks throughout secondary to fatigue with VC for pursed lip breathing.   Trey Paula has a pick-up truck so patient attempted to simulate stepping onto the running board, however was unable to fully put weight through her Lt LE secondary to knee pain. Patient discontinued car transfer to simulated truck due to safety concerns and increased pain and Trey Paula was agreeable to using another car (patient's SUV/Compact car) to pick the patient up on day of discharge.   Patient ambulated ~20' to/from the stairs, in order to simulate her home set-up from where the car is parked, with RW and Supv- Patient then ascended/descended x4 steps with Lt HR and CGA for safety. Patient ascended with Rt LE leading and descended with Lt LE leading for improved stability. Discussed with Trey Paula regarding proper positioning with stair mobility and ensuring the  door is open and the walker is in front of her prior to ambulating within the home.   Therapist, patient and Trey Paula discussed home modifications (night lights, removing rugs, sleeping in guest room for lower height of bed, bed rails, ambulating with shoes or  hospital socks, supv with stair mobility/car transfers, safe bathroom entry, etc.). All questions were answered within treatment session.   Patient returned to her room and transitioned from wheelchair to sitting EOB with RW and Supv- Patient transitioned from sitting EOB to supine with supv. Patient educated regarding leaving her walker at the head of the bed once home in order to ensure she can lift her feet into the bed and also have the walker near her at all times for safe functional mobility. Patient left supine in bed with bed alarm on, call bell within reach and all needs met.    Therapy Documentation Precautions:  Precautions Precautions: Fall Precaution Comments: Monitor SpO2, L knee pain Restrictions Weight Bearing Restrictions: No  Pain: Reports pain in Lt knee, however states this is constant and no requests for pain medication.   Patient reported increased Lt knee pain at end of treatment session and request for pain medication- RN notified and aware.    Therapy/Group: Individual Therapy  Shalamar Crays 02/22/2023, 8:40 AM

## 2023-02-22 NOTE — Progress Notes (Signed)
PROGRESS NOTE   Subjective/Complaints:  No events overnight.  No acute complaints.  Per therapies, ambulating with pulse ox greater than 95%, remains intermittently on oxygen more for comfort.  Patient states she is still pretty tired, but doing fairly well today.  Looking forward to going home.  ROS:  + Chest pain/angina - intermittent, responsive to nitroglycerin and Xanax-none since 6/21 + abdominal cramping/constipation - resolved + generalized weakness - ongoing + Urinary retention -improved Denies fevers, chills, SOB, N/V, abdominal pain, diarrhea, cough, chest pain, new weakness or paraesthesias.   As per HPI  Objective:   No results found. Recent Labs    02/21/23 0554  WBC 5.3  HGB 10.9*  HCT 34.3*  PLT 190     Recent Labs    02/21/23 0554  NA 134*  K 3.8  CL 102  CO2 22  GLUCOSE 143*  BUN 49*  CREATININE 1.37*  CALCIUM 9.4      Intake/Output Summary (Last 24 hours) at 02/22/2023 1610 Last data filed at 02/22/2023 9604 Gross per 24 hour  Intake 540 ml  Output --  Net 540 ml         Physical Exam: Vital Signs Blood pressure 118/64, pulse 88, temperature (!) 97.5 F (36.4 C), temperature source Oral, resp. rate 18, height 5\' 6"  (1.676 m), weight 75.3 kg, SpO2 98 %. Physical Exam Constitutional: No apparent distress. Appropriate appearance for age. +Obese.  Sitting in gym, working with OT. HENT: No JVD. Neck Supple. Trachea midline. Atraumatic, normocephalic. Eyes: PERRLA. EOMI. Visual fields grossly intact.  Cardiovascular: RRR no murmurs/rub/gallops.  1+ pitting edema isolated to the bilateral feet/ankles. Peripheral pulses 2+  Respiratory: CTAB.  No rales, rhonchi, or wheezing.  + 0 L [place] Abdomen: + Large hernia/diastasis, normoactive BS throughout. Soft and NTND.  Skin: C/D/I. No apparent lesions. Chronic skin changes on B/L dorsal feet.   MSK:          Strength: antigravity and against  resistance all 4 extremities; 4+/5  Neurologic exam:  Cognition: AAO to person, place, time and event.  Insight: Good  insight into current condition.  Psych: Pleasant mood, appropriate affect.  Anxiety much improved.  Assessment/Plan: 1. Functional deficits which require 3+ hours per day of interdisciplinary therapy in a comprehensive inpatient rehab setting. Physiatrist is providing close team supervision and 24 hour management of active medical problems listed below. Physiatrist and rehab team continue to assess barriers to discharge/monitor patient progress toward functional and medical goals  Care Tool:  Bathing  Bathing activity did not occur: Refused Body parts bathed by patient: Chest, Right arm, Left arm, Abdomen, Front perineal area, Right upper leg, Left upper leg, Face   Body parts bathed by helper: Left lower leg, Right lower leg, Buttocks     Bathing assist Assist Level: Moderate Assistance - Patient 50 - 74%     Upper Body Dressing/Undressing Upper body dressing   What is the patient wearing?: Dress    Upper body assist Assist Level: Supervision/Verbal cueing    Lower Body Dressing/Undressing Lower body dressing      What is the patient wearing?: Pants     Lower body assist Assist for  lower body dressing: Moderate Assistance - Patient 50 - 74%     Toileting Toileting    Toileting assist Assist for toileting: Minimal Assistance - Patient > 75%     Transfers Chair/bed transfer  Transfers assist     Chair/bed transfer assist level: Contact Guard/Touching assist     Locomotion Ambulation   Ambulation assist      Assist level: Minimal Assistance - Patient > 75% Assistive device: Walker-rolling Max distance: 10'   Walk 10 feet activity   Assist     Assist level: Minimal Assistance - Patient > 75% Assistive device: Walker-rolling   Walk 50 feet activity   Assist Walk 50 feet with 2 turns activity did not occur: Safety/medical  concerns (Unable to ambulate >10' or perform stair mobility secondary to global deconditioning)         Walk 150 feet activity   Assist Walk 150 feet activity did not occur: Safety/medical concerns         Walk 10 feet on uneven surface  activity   Assist Walk 10 feet on uneven surfaces activity did not occur: Safety/medical concerns         Wheelchair     Assist Is the patient using a wheelchair?: Yes Type of Wheelchair: Manual Wheelchair activity did not occur:  (Therapist dependently propelling manual wheelchair secondary to fatigue)  Wheelchair assist level: Dependent - Patient 0% Max wheelchair distance: 150    Wheelchair 50 feet with 2 turns activity    Assist        Assist Level: Dependent - Patient 0%   Wheelchair 150 feet activity     Assist      Assist Level: Dependent - Patient 0%   Blood pressure 118/64, pulse 88, temperature (!) 97.5 F (36.4 C), temperature source Oral, resp. rate 18, height 5\' 6"  (1.676 m), weight 75.3 kg, SpO2 98 %.  Medical Problem List and Plan: 1. Functional deficits secondary to debility/CHF/acute hypoxic respiratory failure             -patient may shower             -ELOS/Goals: 10-14 days, SPV goals PT/OT - 6/28 - 6/14: D/w family, SW plan for discharge to hospice nursing after IPR; no referrals sent while in acute. Family working with Palliative for OP referral; will let us know if they need anything.   - 6/17: RR overnight for chest pain, Sob/tachypnea. Troponins stable 30s, EKG no STEMI. Mild weight increase, mild vascular congestion on CXR improved from last exam. Give lasix 20 mg x1, consult HF team. Responsive to PRN nitroglycerin.  - 6/18: Per son, will need to be intermittent SPV at home with neighbors; patient will be paying caregiver every other day and having her call on off days. Per therapies, self-limiting but is improving. With PT today CGA 13 ft, stair and car transfer goals at SPV and all  otehrs Mod I goals.  - 6/20: Make 15/7 for recurrent CP, SOB - 6/24: Showerd today without oxygen; sating 98-100%! Mostly endurance difficulty, SPV -Mod I with walker up to 55 feet. 6 minute walk test tomorrow.   2.  Antithrombotics: -DVT/anticoagulation:  Pharmaceutical: Lovenox 30mg  QD             -antiplatelet therapy: Aspirin 81 mg daily 3. Pain Management: Tramadol as needed 4. Mood/Behavior/Sleep: Melatonin 5 mg nightly--sleeping well, cont regimen             -antipsychotic agents: N/A - 6/18: Start Buspar  7.5 mg BID for anxiety, Xanax 0.25 mg BID PRN for severe episodes while uptitrating - 6/20: responsive to PRN xanax, ongoing anxiety/recurrent CP/SOB, increase Buspar to 7.5 mg TID  - 6/21: Adjust Xanax to 0.5 mg once daily as needed given consistent use upon waking; can further uptitrate BuSpar in 2 to 3 days - improved, using xanax in AM, tolerating therapies; has not needed nitroglycerin in several days.  Continue current regimen, can continue to uptitrate BuSpar as outpatient.  5. Neuropsych/cognition: This patient is capable of making decisions on her own behalf. 6. Skin/Wound Care: Routine skin checks 7. Fluids/Electrolytes/Nutrition: Routine in and outs with follow-up chemistries - 6/14: Na low-stable 133 this AM; K 3.2 add K dur 20 meq BID x3 days; BUN/Cr stable 1.2-1.5 -02/12/23 Na stable at 132, K 4.1, BUN/Cr improving, will decrease Kdur to QD x2 more days for now and recheck on routine labs on Monday  - 6/17: Na stable, BUN increasing, Cr stable, K WNL - 6/18: Na stable, K WNL - 6/24: Na 134; K 3.8; monitor  8.  COPD exacerbation/tobacco use.  Continue nebulizers as advised.  Check oxygen saturations every shift. Tobacco cessation education; does not want patches.  -02/20/23 sats seem to be doing well, lungs sound good today, nose irritated from [place] tube, will order vaseline.  -Pending 6-minute walk; so far satting well without oxygen.  Continue encouraged weaning.   9.   Hypertension.  Lasix 40 mg twice daily, Imdur 60 mg daily, Cozaar 12.5 mg daily, Hydralazine 10 mg every 8 hours, Toprol-XL 25 mg daily.  Monitor with increased mobility - 6/14: Mildly low today; monitor with diuresis, if symptomatic may need cards to assist in adjusting -6/15-16/24 BPs uptrending, monitor for now - 6/17-20: BP stable/low - may be too low with flomax 0.8 mg. Reduce to 0.4 mg; monitor for orthostasis -6/22-23/24 BP stable/mildly hypotensive, asymptomatic   Vitals:   02/19/23 0603 02/19/23 1328 02/19/23 1959 02/20/23 0343  BP: 113/60 110/61 122/69 117/60   02/20/23 0523 02/20/23 1256 02/20/23 1919 02/20/23 2125  BP: 115/61 (!) 105/54 128/64 (!) 98/48   02/21/23 0550 02/21/23 1454 02/21/23 1934 02/22/23 0538  BP: 129/75 (!) 106/59 111/60 118/64     10.  Diastolic congestive heart failure/intermittent chest pain.  Ejection fraction 20 to 25%.  Continue diuresis.  Jardiance 25 mg daily.  Follow-up heart failure team. Daily weights.    - 6/14: Likely decreased d/t bed vs standing, monitor trend  -6/15-16/24 wt stable , cont monitoring - 6/17: Lasix 20 mg once today as below. Per HF team recs: "May be demand ischemia with CHF though wt has continued to decrease.  EKG without acute changes.  Not on tele in rehab  - could consider other CAD beside LAD on imdur 60 - could add ranexa 500 BID or increase imdur to 90 mg daily, will give another 30 mg po now then 90 daily" - 6/18-20: 1-2x daily NTG for CP; weights stable; anxiety component as above -6/22-24/24 seems to be doing better with regards to CP; see above -Continue downtrending, vitals and labs stable  Filed Weights   02/20/23 0524 02/21/23 0509 02/22/23 0525  Weight: 76 kg 75.9 kg 75.3 kg      11.  AKI on CKD/hyponatremia.  Follow-up chemistries.  Entresto on hold until renal function improved. - resolved, Cr now stable at 1.3-1.5 - Cr stable 1.5; has been 1.2-1.5; monitor BMP in AM d/t low BP and significant  diuresis  12.  Hyperlipidemia.  Lipitor 80mg  QD   13.  Type II diabetes mellitus.  Hemoglobin A1c 6.0.   - 6/14: BG stable, continue SSI and Jardiance 25mg  every day  - 6/15-24/24 CBGs stable, monitor Recent Labs    02/21/23 1659 02/21/23 2109 02/22/23 0541  GLUCAP 125* 173* 150*       14.  Constipation. Failed enema today. Passing gas. Check KUB - mild gaseous distention, small volume stool.  Adjust bowel program - Sennakot S 2 tabs BID, PRN sorbitol.   - 6/14: Small BM and smear yesterday. 1x sorbitol. Add Miralax daily to regimen. Add PRN suppository -02/12/23 LBM this morning, monitor' -02/13/23 multiple BMs overnight, reportedly black/dark colored; pt asymptomatic. H&H showing slight decrease in Hgb 11.7 (previously 12.3-12.8); will recheck H&H at 6hr mark, get fecal occult card done, and monitor closely; if continuing to have melanotic stools or worsening anemia, may need to consult GI/start protonix/stop lovenox; will monitor.  - 6/17: + occult blood x2, HgB stable, start protonix BID - 6/19: Increase miralax to BID, hard stool 6/18 - 6/22-23/24 having daily BMs now, monitor - 6.24: multiple Bms overnight; reduce sennakot S to 1 tab QHS  15. Urinary retention. High PVR 700 yesterday. Check x3 days, start Flomax 0.4 mg at bedtime. - 6/19: High retention overnight, ISCx1. Increase flomax to 0.8 mg, timed toiletting at bedtime Q4H. UA ordered. -> reduced flomax d/t hypotension - 6/20: UA with rare bacteria, small LE, no Nitrites; given no apparent symptoms other than retention will not treat. Bethenachol contraindicated d/t CAD - no PVRs recently; rescan   LOS: 12 days A FACE TO FACE EVALUATION WAS PERFORMED  Angelina Sheriff 02/22/2023, 9:27 AM

## 2023-02-22 NOTE — Progress Notes (Signed)
Occupational Therapy Session Note  Patient Details  Name: Angela Lucero MRN: [id number] Date of Birth: [birth year]  Today's Date: 02/22/2023 Session 1 OT Individual Time: 4098-1191 OT Individual Time Calculation (min): 40 min   Session 2 OT Individual Time: 4782-9562 OT Individual Time Calculation (min): 43 min    Short Term Goals: Week 2:  OT Short Term Goal 1 (Week 2): LTG=STG 2/2 ELOS  Skilled Therapeutic Interventions/Progress Updates:  Session 1   Pt greeted semi-reclined in bed and agreeable to OT treatment session. Pt completed bed mobility w/ HOB elevated and supervision. She donned shoes at EOB w/ set-up, then ambulated into bathroom w/ RW and supervision. Pt was on 2L of O2. Pt agreeable to shower and wanted to try to shower without O2. Pt completed shower with CGA when standing to wash buttocks and use of grab bar. Pt was able to get B LE's into figure 4 position to wash lower legs. SpO2 after shower 100% on room air. SpO2 checked throughout sit<>stand dressing tasks with SpO2 maintained at 98%-100%. Pt able to thread underwear and stand to pull them up with close supervision. PT returned to bed at end of session and left without O2 on, call bell in reach, bed alarm on, and needs met.   Session 2 OT treatment session focused on pt/family education with patients son. Education provided regarding safe BADL performance in home environment, home modifications, DME needs, energy conservation techniques, and safety awareness.  Pt ambulated to bathroom w/ RW and supervision. Pt voided bladder and completed peri-care with supervision. Pt then brought to therapy gym and practiced walk-in shower transfer in simulated home environment. Pt demonstrated understanding. Pt given home exercise program and pt completed 10 reps of each exercise using yellow theraband. Pt returned to room and left seated in wc with son present and needs met.    Therapy Documentation Precautions:   Precautions Precautions: Fall Precaution Comments: Monitor SpO2, L knee pain Restrictions Weight Bearing Restrictions: No Pain: Pain Assessment Pain Scale: 0-10 Pain Score: 3  Rest and repositioned Therapy/Group: Individual Therapy  Mal Amabile 02/22/2023, 2:59 PM

## 2023-02-23 DIAGNOSIS — F54 Psychological and behavioral factors associated with disorders or diseases classified elsewhere: Secondary | ICD-10-CM

## 2023-02-23 HISTORY — DX: Psychological and behavioral factors associated with disorders or diseases classified elsewhere: F54

## 2023-02-23 LAB — GLUCOSE, CAPILLARY
Glucose-Capillary: 136 mg/dL — ABNORMAL HIGH (ref 70–99)
Glucose-Capillary: 107 mg/dL — ABNORMAL HIGH (ref 70–99)
Glucose-Capillary: 132 mg/dL — ABNORMAL HIGH (ref 70–99)

## 2023-02-23 NOTE — Progress Notes (Signed)
Occupational Therapy Session Note  Patient Details  Name: Angela Lucero MRN: [id number] Date of Birth: Nov 11, 1940  Today's Date: 02/23/2023 OT Individual Time: 1400-1500 OT Individual Time Calculation (min): 60 min    Short Term Goals: Week 2:  OT Short Term Goal 1 (Week 2): LTG=STG 2/2 ELOS  Skilled Therapeutic Interventions/Progress Updates:    Pt received supine with no c/o pain, agreeable to OT session. She completed bed mobility with (S) to EOB. SpO2 assessed and 96% on RA. She completed 120 ft of functional mobility with 2 seated rest breaks to address functional activity tolerance for carryover to home ADL routine. Patient required increased time for initiation, cuing, rest breaks, and for completion of tasks throughout session. Utilized therapeutic use of self throughout to promote efficiency. Pt completed BUE 3# dowel therex, Completed to challenge BUE strength and endurance required for maximal independence with ADLs and ADL transfers. Pt completed bicep curls, anterior deltoid raise, tricep extension, and full deltoid diagonal raise. Cueing required to ensure proper form and technique for proper muscle activation. 3x10 repetitions. Pt completed standing level reciprocal tapping activity with B UE support using a 5 in step 3x20 repetitions. Activity performed to challenge dynamic standing balance and functional activity tolerance to simulate household threshold management and reduce fall risk. Pt required (S) overall. She returned to her room and was left supine with all needs met, bed alarm set.    Therapy Documentation Precautions:  Precautions Precautions: Fall Precaution Comments: Monitor SpO2, L knee pain Restrictions Weight Bearing Restrictions: No   Therapy/Group: Individual Therapy  Crissie Reese 02/23/2023, 6:44 AM

## 2023-02-23 NOTE — Progress Notes (Signed)
Physical Therapy Session Note  Patient Details  Name: Angela Lucero MRN: [id number] Date of Birth: Jul 24, 1941  Today's Date: 02/23/2023 PT Individual Time: 4098-1191 PT Individual Time Calculation (min): 30 min   Short Term Goals: Week 2:  PT Short Term Goal 1 (Week 2): Patient will initiate stair training PT Short Term Goal 2 (Week 2): Patient will ambulate x50' with LRAD and Supv PT Short Term Goal 3 (Week 2): Patient will tolerate sitting OOB in between treatment sessions  Skilled Therapeutic Interventions/Progress Updates:  Patient greeted sitting EOB in her room and agreeable to PT treatment session. Patient stood from EOB and performed stand pivot transfer to wheelchair with RW and distant Supv. Patient wheeled to/from her room and day room for time management and energy conservation.   Patient gait trained x85' and x123' with RW and distant supv- Minor VC for stepping within the frame of the walker and improved postural extension with good effort noted, however unable to sustain as she fatigues. Patient required extended seated rest break after gait trial, however improved recovery time since yesterday. During second gait trial, patient required x2 standing rest breaks secondary to fatigue.   Patient returned to her room and ambulated in/out of the bathroom with RW and distant supv- Patient with continent void and BM (NT notified and agreeable to charting). Patient left supine in bed with bed alarm on, call bell within reach and all needs met.    Therapy Documentation Precautions:  Precautions Precautions: Fall Precaution Comments: Monitor SpO2, L knee pain Restrictions Weight Bearing Restrictions: No  Pain: Lt knee pain at baseline, patient did ask for pain medication however it had not been administered yet- Therapist notified RN regarding patient's need for pain medication at end of treatment session.    Therapy/Group: Individual Therapy  Maraki Macquarrie 02/23/2023, 9:55 AM

## 2023-02-23 NOTE — Progress Notes (Signed)
Patient awake from sleep and felt ' my anxiety is starting,' po Xanax administered requesting her Nasal oxygen provided. Oxygen off this shift 1900-0400,, tolerated w/o difficulty.

## 2023-02-23 NOTE — Progress Notes (Signed)
Physical Therapy Session Note  Patient Details  Name: Reality Dejonge MRN: [id number] Date of Birth: December 09, 1940  Today's Date: 02/23/2023 PT Individual Time: 0865-7846 PT Individual Time Calculation (min): 57 min   Short Term Goals: Week 2:  PT Short Term Goal 1 (Week 2): Patient will initiate stair training PT Short Term Goal 2 (Week 2): Patient will ambulate x50' with LRAD and Supv PT Short Term Goal 3 (Week 2): Patient will tolerate sitting OOB in between treatment sessions  Skilled Therapeutic Interventions/Progress Updates:     Pt received seated at EOB and agrees to therapy. Reports pain in Lt knee. Chronic in nature. PT provides rest breaks and mobility to manage pain. Pt performs sit to stand with RW and cues for hand placement and initiation. Pt ambulates multiple bouts with RW to work on gait training and endurance. PT follows closely with WC and provides cues for upright gaze, shoulder and hip extension to improve posture and balance, and pursed lip breathing to optimize oxygen sats. Pt ambulates bouts of 55', 45', 65', 60', and 75'. Extended seated rest breaks between each bout. PT educates on importance of pursed lip breathing for optimal oxygenation as well as beneficial stimulation on parasympathetic nervous system. WC transport back to room. Pt left seated in WC with all needs within reach.   Therapy Documentation Precautions:  Precautions Precautions: Fall Precaution Comments: Monitor SpO2, L knee pain Restrictions Weight Bearing Restrictions: No  Therapy/Group: Individual Therapy  Beau Fanny, PT, DPT 02/23/2023, 4:27 PM

## 2023-02-23 NOTE — Progress Notes (Signed)
Patient ID: Angela Lucero, female   DOB: 1941/03/21, 82 y.o.   MRN: [id number]  SW faxed outpatient PT/OT referral to [facility]- cardiac rehab ([phone]/f:[phone]). *referral received.    Cecile Sheerer, MSW, LCSWA Office: [phone] Cell: [phone] Fax: [phone]

## 2023-02-23 NOTE — Patient Care Conference (Signed)
Inpatient RehabilitationTeam Conference and Plan of Care Update Date: 02/22/2023  Time: 10:33 AM    Patient Name: Angela Lucero      Medical Record Number: [id number]  Date of Birth: 05-05-1941 Sex: Female         Room/Bed: 4W21C/4W21C-01 Payor Info: Payor: MEDICARE / Plan: MEDICARE PART A AND B / Product Type: *No Product type* /    Admit Date/Time:  02/10/2023 12:55 PM  Primary Diagnosis:  Debility  Hospital Problems: Principal Problem:   Debility Active Problems:   Melanotic stools   Anemia   Coping style affecting medical condition    Expected Discharge Date: Expected Discharge Date: 02/25/23  Team Members Present: Physician leading conference: Dr. Elijah Birk Social Worker Present: Cecile Sheerer, LCSWA Nurse Present: Vedia Pereyra, RN PT Present: Amedeo Plenty, PT OT Present: Kearney Hard, OT PPS Coordinator present : Fae Pippin, SLP     Current Status/Progress Goal Weekly Team Focus  Bowel/Bladder   Patirnt is continent of bladder and bowel, LBM 02/21/23  *** Maintain continence   Assess toileting needsQS/PRN    Swallow/Nutrition/ Hydration      ***         ADL's   Supervision overall, occasional CGA  *** Mod I/supervision for higher level iADLs   Working on activty tolerance within BADL tsks    Mobility   Supv for all functional mobility with the use of a RW- Gait limited to 55' currently, able to do 4 steps with S HR with CGA/MinA  *** Supv/ModI with LRAD  Barriers: None at this time, preparing for a safe discharge home. Continue to work on independence with functional mobility- endurance, strength, oxygen dependence, family training, education, etc.    Communication      ***          Safety/Cognition/ Behavioral Observations     ***          Pain   Patient c/o of back, shoulder pain with episodes of headache, medicated with standard medications and prn  *** Pain <= 3-4/10   Assess  pain QS/PRN with f/u    Skin   Skin intact,   *** Maintain skin integrity, no new skin issue while on IP Rehab  QS/PRN assessment      Discharge Planning:  Pt reports that her plan is to return home, and will have intermittent support from her neighbor who will check in on her once a day, and her son can provide PRN support as he works. Fam edu on 6/25 1pm-4pm with her son. SW will confirm there are no barriers to discharge.   Team Discussion: Debility. PVRs continue to be needed. Has not had any additional episodes of chest pain and has not used Nitroglycerin in 4 days. Will have cardiac rehab at discharge. Daily weights. Heart healthy diet/Thin liquids.  Currently O2 at 2L [place] but has been able to to tolerate therapy with SATs of 96-100% on RA with activity. Has also been staying OOB to increase endurence. Family education today.  Patient on target to meet rehab goals: yes, will be at goal level by discharge 02/25/23  *See Care Plan and progress notes for long and short-term goals.   Revisions to Treatment Plan:  Monitor labs, weights, and VS. Walk test 02/23/23 with therapy   Teaching Needs: Medications, safety, self care, gait/transfer training, skin care, etc.   Current Barriers to Discharge: Decreased caregiver support and daily weights.  Possible Resolutions to Barriers: Family education completed. Compliance with self care.  Order recommended DME if needed.      Medical Summary               I attest that I was present, lead the team conference, and concur with the assessment and plan of the team.   ALISABETH SELKIRK 02/22/2023, 10:33 AM

## 2023-02-23 NOTE — Progress Notes (Signed)
PROGRESS NOTE   Subjective/Complaints:  No events overnight.  No acute complaints. Xanax PRN in AM today; states the xanx takes 30 minutes or so to kick in but once it did she didn't feel like she needed the oxygen.  Weaning oxygen as able, sats 95-100%  ROS:  + Chest pain/angina - intermittent, responsive to nitroglycerin and Xanax-none since 6/21 + abdominal cramping/constipation - resolved + generalized weakness - ongoing + Urinary retention -improved Denies fevers, chills, SOB, N/V, abdominal pain, diarrhea, cough, chest pain, new weakness or paraesthesias.   As per HPI  Objective:   No results found. Recent Labs    02/21/23 0554  WBC 5.3  HGB 10.9*  HCT 34.3*  PLT 190     Recent Labs    02/21/23 0554  NA 134*  K 3.8  CL 102  CO2 22  GLUCOSE 143*  BUN 49*  CREATININE 1.37*  CALCIUM 9.4      Intake/Output Summary (Last 24 hours) at 02/23/2023 0741 Last data filed at 02/23/2023 0600 Gross per 24 hour  Intake 680 ml  Output 0 ml  Net 680 ml         Physical Exam: Vital Signs Blood pressure 137/65, pulse (!) 105, temperature 97.8 F (36.6 C), resp. rate 18, height 5\' 6"  (1.676 m), weight 75.3 kg, SpO2 99 %. Physical Exam Constitutional: No apparent distress. Appropriate appearance for age. +Obese.  Sitting in gym, working with PT.  HENT: No JVD. Neck Supple. Trachea midline. Atraumatic, normocephalic. Eyes: PERRLA. EOMI. Visual fields grossly intact.  Cardiovascular: RRR no murmurs/rub/gallops.  1+ pitting edema isolated to the bilateral feet/ankles. Peripheral pulses 2+  - unchanged Respiratory: CTAB.  No rales, rhonchi, or wheezing.  + 0 L Amanda Abdomen: + Large hernia/diastasis, normoactive BS throughout. Soft and NTND.  Skin: C/D/I. No apparent lesions. Chronic skin changes on B/L dorsal feet.   MSK:          Strength: antigravity and against resistance all 4 extremities; 4+/5  Neurologic  exam:  Cognition: AAO to person, place, time and event.  Insight: Good  insight into current condition.  Psych: Pleasant mood, appropriate affect.  Anxiety much improved.  Assessment/Plan: 1. Functional deficits which require 3+ hours per day of interdisciplinary therapy in a comprehensive inpatient rehab setting. Physiatrist is providing close team supervision and 24 hour management of active medical problems listed below. Physiatrist and rehab team continue to assess barriers to discharge/monitor patient progress toward functional and medical goals  Care Tool:  Bathing  Bathing activity did not occur: Refused Body parts bathed by patient: Chest, Right arm, Left arm, Abdomen, Front perineal area, Right upper leg, Left upper leg, Buttocks, Right lower leg, Left lower leg, Face   Body parts bathed by helper: Left lower leg, Right lower leg, Buttocks     Bathing assist Assist Level: Contact Guard/Touching assist     Upper Body Dressing/Undressing Upper body dressing   What is the patient wearing?: Dress    Upper body assist Assist Level: Supervision/Verbal cueing    Lower Body Dressing/Undressing Lower body dressing      What is the patient wearing?: Pants     Lower body  assist Assist for lower body dressing: Contact Guard/Touching assist     Toileting Toileting    Toileting assist Assist for toileting: Contact Guard/Touching assist     Transfers Chair/bed transfer  Transfers assist     Chair/bed transfer assist level: Supervision/Verbal cueing     Locomotion Ambulation   Ambulation assist      Assist level: Supervision/Verbal cueing Assistive device: Walker-rolling Max distance: 50'   Walk 10 feet activity   Assist     Assist level: Supervision/Verbal cueing Assistive device: Walker-rolling   Walk 50 feet activity   Assist Walk 50 feet with 2 turns activity did not occur: Safety/medical concerns (Unable to ambulate >10' or perform stair  mobility secondary to global deconditioning)  Assist level: Supervision/Verbal cueing Assistive device: Walker-rolling    Walk 150 feet activity   Assist Walk 150 feet activity did not occur: Safety/medical concerns         Walk 10 feet on uneven surface  activity   Assist Walk 10 feet on uneven surfaces activity did not occur: Safety/medical concerns         Wheelchair     Assist Is the patient using a wheelchair?: Yes Type of Wheelchair: Manual Wheelchair activity did not occur:  (Therapist dependently propelling manual wheelchair secondary to fatigue)  Wheelchair assist level: Dependent - Patient 0% Max wheelchair distance: 150    Wheelchair 50 feet with 2 turns activity    Assist        Assist Level: Dependent - Patient 0%   Wheelchair 150 feet activity     Assist      Assist Level: Dependent - Patient 0%   Blood pressure 137/65, pulse (!) 105, temperature 97.8 F (36.6 C), resp. rate 18, height 5\' 6"  (1.676 m), weight 75.3 kg, SpO2 99 %.  Medical Problem List and Plan: 1. Functional deficits secondary to debility/CHF/acute hypoxic respiratory failure             -patient may shower             -ELOS/Goals: 10-14 days, SPV goals PT/OT - 6/28 - 6/14: D/w family, SW plan for discharge to hospice nursing after IPR; no referrals sent while in acute. Family working with Palliative for OP referral; will let us know if they need anything.   - 6/17: RR overnight for chest pain, Sob/tachypnea. Troponins stable 30s, EKG no STEMI. Mild weight increase, mild vascular congestion on CXR improved from last exam. Give lasix 20 mg x1, consult HF team. Responsive to PRN nitroglycerin.  - 6/18: Per son, will need to be intermittent SPV at home with neighbors; patient will be paying caregiver every other day and having her call on off days. Per therapies, self-limiting but is improving. With PT today CGA 13 ft, stair and car transfer goals at SPV and all otehrs  Mod I goals.  - 6/20: Make 15/7 for recurrent CP, SOB - 6/24: Showerd today without oxygen; sating 98-100%! Mostly endurance difficulty, SPV -Mod I with walker up to 55 feet. 6 minute walk test tomorrow.  - 6/26: Doing well without oxygen; aim for DC by end of hospitalization  2.  Antithrombotics: -DVT/anticoagulation:  Pharmaceutical: Lovenox 30mg  QD             -antiplatelet therapy: Aspirin 81 mg daily 3. Pain Management: Tramadol as needed 4. Mood/Behavior/Sleep: Melatonin 5 mg nightly--sleeping well, cont regimen             -antipsychotic agents: N/A - 6/18: Start Buspar  7.5 mg BID for anxiety, Xanax 0.25 mg BID PRN for severe episodes while uptitrating - 6/20: responsive to PRN xanax, ongoing anxiety/recurrent CP/SOB, increase Buspar to 7.5 mg TID  - 6/21: Adjust Xanax to 0.5 mg once daily as needed given consistent use upon waking; can further uptitrate BuSpar in 2 to 3 days - improved, using xanax in AM, tolerating therapies; has not needed nitroglycerin in several days.  Continue current regimen, can continue to uptitrate BuSpar as outpatient.  5. Neuropsych/cognition: This patient is capable of making decisions on her own behalf. 6. Skin/Wound Care: Routine skin checks 7. Fluids/Electrolytes/Nutrition: Routine in and outs with follow-up chemistries - 6/14: Na low-stable 133 this AM; K 3.2 add K dur 20 meq BID x3 days; BUN/Cr stable 1.2-1.5 -02/12/23 Na stable at 132, K 4.1, BUN/Cr improving, will decrease Kdur to QD x2 more days for now and recheck on routine labs on Monday  - 6/17: Na stable, BUN increasing, Cr stable, K WNL - 6/18: Na stable, K WNL - 6/24: Na 134; K 3.8; monitor  8.  COPD exacerbation/tobacco use.  Continue nebulizers as advised.  Check oxygen saturations every shift. Tobacco cessation education; does not want patches.  -02/20/23 sats seem to be doing well, lungs sound good today, nose irritated from Sarah Ann tube, will order vaseline.  -Pending 6-minute walk; so far  satting well without oxygen.  Continue encouraged weaning.   9.  Hypertension.  Lasix 40 mg twice daily, Imdur 60 mg daily, Cozaar 12.5 mg daily, Hydralazine 10 mg every 8 hours, Toprol-XL 25 mg daily.  Monitor with increased mobility - 6/14: Mildly low today; monitor with diuresis, if symptomatic may need cards to assist in adjusting -6/15-16/24 BPs uptrending, monitor for now - 6/17-20: BP stable/low - may be too low with flomax 0.8 mg. Reduce to 0.4 mg; monitor for orthostasis -6/22-26/24 BP stable/mildly hypotensive, asymptomatic - normotensive   Vitals:   02/20/23 0523 02/20/23 1256 02/20/23 1919 02/20/23 2125  BP: 115/61 (!) 105/54 128/64 (!) 98/48   02/21/23 0550 02/21/23 1454 02/21/23 1934 02/22/23 0538  BP: 129/75 (!) 106/59 111/60 118/64   02/22/23 1434 02/22/23 1500 02/22/23 1955 02/23/23 0407  BP: 107/62 107/62 125/63 137/65     10.  Diastolic congestive heart failure/intermittent chest pain.  Ejection fraction 20 to 25%.  Continue diuresis.  Jardiance 25 mg daily.  Follow-up heart failure team. Daily weights.    - 6/14: Likely decreased d/t bed vs standing, monitor trend  -6/15-16/24 wt stable , cont monitoring - 6/17: Lasix 20 mg once today as below. Per HF team recs: "May be demand ischemia with CHF though wt has continued to decrease.  EKG without acute changes.  Not on tele in rehab  - could consider other CAD beside LAD on imdur 60 - could add ranexa 500 BID or increase imdur to 90 mg daily, will give another 30 mg po now then 90 daily" - 6/18-20: 1-2x daily NTG for CP; weights stable; anxiety component as above -6/22-26/24 seems to be doing better with regards to CP; see above - Weights stable  Filed Weights   02/20/23 0524 02/21/23 0509 02/22/23 0525  Weight: 76 kg 75.9 kg 75.3 kg      11.  AKI on CKD/hyponatremia.  Follow-up chemistries.  Entresto on hold until renal function improved. - resolved, Cr now stable at 1.3-1.5 - Cr stable 1.5; has been 1.2-1.5;  monitor BMP in AM d/t low BP and significant diuresis  12.  Hyperlipidemia.  Lipitor  80mg  QD   13.  Type II diabetes mellitus.  Hemoglobin A1c 6.0.   - 6/14: BG stable, continue SSI and Jardiance 25mg  every day  - 6/15-24/24 CBGs stable, monitor  - 6/26: CBG elevated overnight; monitor for trend Recent Labs    02/22/23 1159 02/22/23 1647 02/22/23 2103  GLUCAP 92  92 105* 232*       14.  Constipation. Failed enema today. Passing gas. Check KUB - mild gaseous distention, small volume stool.  Adjust bowel program - Sennakot S 2 tabs BID, PRN sorbitol.   - 6/14: Small BM and smear yesterday. 1x sorbitol. Add Miralax daily to regimen. Add PRN suppository -02/12/23 LBM this morning, monitor' -02/13/23 multiple BMs overnight, reportedly black/dark colored; pt asymptomatic. H&H showing slight decrease in Hgb 11.7 (previously 12.3-12.8); will recheck H&H at 6hr mark, get fecal occult card done, and monitor closely; if continuing to have melanotic stools or worsening anemia, may need to consult GI/start protonix/stop lovenox; will monitor.  - 6/17: + occult blood x2, HgB stable, start protonix BID - 6/19: Increase miralax to BID, hard stool 6/18 - 6/22-23/24 having daily BMs now, monitor - 6.24: multiple Bms overnight; reduce sennakot S to 1 tab at bedtime 6/26 last BM  15. Urinary retention. High PVR 700 yesterday. Check x3 days, start Flomax 0.4 mg at bedtime. - 6/19: High retention overnight, ISCx1. Increase flomax to 0.8 mg, timed toiletting at bedtime Q4H. UA ordered. -> reduced flomax d/t hypotension - 6/20: UA with rare bacteria, small LE, no Nitrites; given no apparent symptoms other than retention will not treat. Bethenachol contraindicated d/t CAD - no PVRs recently; rescan   LOS: 13 days A FACE TO FACE EVALUATION WAS PERFORMED  Angelina Sheriff 02/23/2023, 7:41 AM

## 2023-02-23 NOTE — Progress Notes (Signed)
Occupational Therapy Session Note  Patient Details  Name: Angela Lucero MRN: [id number] Date of Birth: Dec 28, 1940  Today's Date: 02/23/2023 OT Individual Time: 1100-1155 OT Individual Time Calculation (min): 55 min    Short Term Goals: Week 2:  OT Short Term Goal 1 (Week 2): LTG=STG 2/2 ELOS  Skilled Therapeutic Interventions/Progress Updates:  Pt received sitting in [facility] for skilled OT session with focus on general conditioning. Pt agreeable to interventions, demonstrating overall pleasant mood. Pt reported 8/10 L-knee pain. OT offering intermediate rest breaks and positioning suggestions throughout session to address pain/fatigue and maximize participation/safety in session.  Pt dependent for WC transport from room<> day room for energy conservation. In day room, pt participates in series of strengthening/endurance exercises, noted below: -Seated toe taps, 4x20 -Diagonal reaches, using 2# medicine ball, 3x10  Pt requires extended rest breaks, O2 levels stable (97-98%).   Pt with urgency, returned to room, ambulating into/out of bathroom and performing 3/3 toileting activities with supervision + RW.   Pt remained resting in bed with all immediate needs met at end of session. Pt continues to be appropriate for skilled OT intervention to promote further functional independence.   Therapy Documentation Precautions:  Precautions Precautions: Fall Precaution Comments: Monitor SpO2, L knee pain Restrictions Weight Bearing Restrictions: No   Therapy/Group: Individual Therapy  Lou Cal, OTR/L, MSOT  02/23/2023, 5:51 AM

## 2023-02-24 LAB — GLUCOSE, CAPILLARY
Glucose-Capillary: 128 mg/dL — ABNORMAL HIGH (ref 70–99)
Glucose-Capillary: 152 mg/dL — ABNORMAL HIGH (ref 70–99)
Glucose-Capillary: 155 mg/dL — ABNORMAL HIGH (ref 70–99)
Glucose-Capillary: 145 mg/dL — ABNORMAL HIGH (ref 70–99)

## 2023-02-24 MED ORDER — ALPRAZOLAM 0.5 MG PO TABS: 0.5000 mg | ORAL_TABLET | Freq: Every day | ORAL | 0 refills | Status: DC | PRN

## 2023-02-24 MED ORDER — TRAMADOL HCL 50 MG PO TABS: 50.0000 mg | ORAL_TABLET | Freq: Four times a day (QID) | ORAL | 0 refills | Status: DC | PRN

## 2023-02-24 MED ORDER — MELATONIN 5 MG PO TABS: 5.0000 mg | ORAL_TABLET | Freq: Every day | ORAL | 0 refills | Status: AC

## 2023-02-24 MED ORDER — ATORVASTATIN CALCIUM 80 MG PO TABS: 80.0000 mg | ORAL_TABLET | Freq: Every day | ORAL | 0 refills | Status: DC

## 2023-02-24 MED ORDER — FUROSEMIDE 40 MG PO TABS: 40.0000 mg | ORAL_TABLET | Freq: Two times a day (BID) | ORAL | 0 refills | Status: DC

## 2023-02-24 MED ORDER — UMECLIDINIUM-VILANTEROL 62.5-25 MCG/ACT IN AEPB: 1.0000 | INHALATION_SPRAY | Freq: Every day | RESPIRATORY_TRACT | 0 refills | Status: DC

## 2023-02-24 MED ORDER — BUSPIRONE HCL 7.5 MG PO TABS: 7.5000 mg | ORAL_TABLET | Freq: Three times a day (TID) | ORAL | 0 refills | Status: DC

## 2023-02-24 MED ORDER — ISOSORBIDE MONONITRATE ER 30 MG PO TB24: 90.0000 mg | ORAL_TABLET | Freq: Every day | ORAL | 0 refills | Status: DC

## 2023-02-24 MED ORDER — PANTOPRAZOLE SODIUM 40 MG PO TBEC: 40.0000 mg | DELAYED_RELEASE_TABLET | Freq: Two times a day (BID) | ORAL | 0 refills | Status: DC

## 2023-02-24 MED ORDER — LOSARTAN POTASSIUM 25 MG PO TABS: 12.5000 mg | ORAL_TABLET | Freq: Every day | ORAL | 0 refills | Status: DC

## 2023-02-24 MED ORDER — NITROGLYCERIN 0.4 MG SL SUBL: 0.4000 mg | SUBLINGUAL_TABLET | SUBLINGUAL | 0 refills | Status: DC | PRN

## 2023-02-24 MED ORDER — TAMSULOSIN HCL 0.4 MG PO CAPS: 0.4000 mg | ORAL_CAPSULE | Freq: Every day | ORAL | 0 refills | Status: DC

## 2023-02-24 MED ORDER — ALBUTEROL SULFATE HFA 108 (90 BASE) MCG/ACT IN AERS: 2.0000 | INHALATION_SPRAY | Freq: Four times a day (QID) | RESPIRATORY_TRACT | 0 refills | Status: DC | PRN

## 2023-02-24 MED ORDER — VITAMIN D3 250 MCG (10000 UT) PO TABS: 10000.0000 [IU] | ORAL_TABLET | Freq: Two times a day (BID) | ORAL | 0 refills | Status: DC

## 2023-02-24 MED ORDER — METOPROLOL SUCCINATE ER 25 MG PO TB24: 25.0000 mg | ORAL_TABLET | Freq: Every day | ORAL | 3 refills | Status: DC

## 2023-02-24 MED ORDER — EMPAGLIFLOZIN 25 MG PO TABS: 25.0000 mg | ORAL_TABLET | Freq: Every day | ORAL | 0 refills | Status: DC

## 2023-02-24 MED ORDER — HYDRALAZINE HCL 10 MG PO TABS: 10.0000 mg | ORAL_TABLET | Freq: Three times a day (TID) | ORAL | 0 refills | Status: DC

## 2023-02-24 NOTE — Progress Notes (Signed)
Occupational Therapy Discharge Summary  Patient Details  Name: Angela Lucero MRN: [id number] Date of Birth: 10/14/1940  Date of Discharge from OT service:February 24, 2023   Patient has met 9 of 9 long term goals due to improved activity tolerance, improved balance, postural control, and ability to compensate for deficits.  Patient to discharge at overall Modified Independent level.  Patient's care partner is independent to provide the necessary physical assistance at discharge for higher level IADL tasks and supervision for bathing tasks.  Reasons goals not met: n/a  Recommendation:  Patient will benefit from ongoing skilled OT services in outpatient setting to continue to advance functional skills in the area of BADL and iADL.  Equipment: 3-in-1 BSC  Reasons for discharge: treatment goals met and discharge from hospital  Patient/family agrees with progress made and goals achieved: Yes  OT Discharge Precautions/Restrictions  Precautions Precautions: Fall Restrictions Weight Bearing Restrictions: No ADL ADL Eating: Independent Where Assessed-Eating: Edge of bed Grooming: Independent Where Assessed-Grooming: Sitting at sink Upper Body Bathing: Independent Where Assessed-Upper Body Bathing: Edge of bed Lower Body Bathing: Supervision/safety Where Assessed-Lower Body Bathing: Sitting at sink, Standing at sink Upper Body Dressing: Independent Where Assessed-Upper Body Dressing: Edge of bed Lower Body Dressing: Supervision/safety Where Assessed-Lower Body Dressing: Standing at sink, Sitting at sink Toileting: Modified independent Where Assessed-Toileting: Bedside Commode Toilet Transfer: Modified independent Tub/Shower Transfer: Close supervison Film/video editor: Not assessed Perception  Perception: Within Functional Limits Praxis Praxis: Intact Cognition Cognition Overall Cognitive Status: Within Functional Limits for tasks assessed Arousal/Alertness:  Awake/alert Orientation Level: Person;Place;Situation Person: Oriented Place: Oriented Situation: Oriented Memory: Appears intact Awareness: Appears intact Problem Solving: Appears intact Brief Interview for Mental Status (BIMS) Repetition of Three Words (First Attempt): 3 Temporal Orientation: Year: Correct Temporal Orientation: Month: Accurate within 5 days Temporal Orientation: Day: Correct Recall: "Sock": Yes, no cue required Recall: "Blue": Yes, no cue required Recall: "Bed": Yes, no cue required BIMS Summary Score: 15 Sensation Sensation Light Touch: Appears Intact Hot/Cold: Appears Intact Coordination Gross Motor Movements are Fluid and Coordinated: No Fine Motor Movements are Fluid and Coordinated: Yes Motor  Motor Motor - Discharge Observations: Continues to have generalized weakness but improved since eval Mobility  Bed Mobility Bed Mobility: Sit to Supine;Supine to Sit;Rolling Right;Rolling Left Rolling Right: Independent Supine to Sit: Independent Sit to Supine: Independent Transfers Sit to Stand: Independent with assistive device Stand to Sit: Independent with assistive device  Balance Balance Balance Assessed: Yes Static Sitting Balance Static Sitting - Balance Support: Feet supported Static Sitting - Level of Assistance: 6: Modified independent (Device/Increase time) Dynamic Sitting Balance Dynamic Sitting - Balance Support: Feet supported Dynamic Sitting - Level of Assistance: 6: Modified independent (Device/Increase time) Static Standing Balance Static Standing - Balance Support: During functional activity;Bilateral upper extremity supported Static Standing - Level of Assistance: 6: Modified independent (Device/Increase time) Dynamic Standing Balance Dynamic Standing - Balance Support: During functional activity;Bilateral upper extremity supported Dynamic Standing - Level of Assistance: 6: Modified independent (Device/Increase time) Extremity/Trunk  Assessment RUE Assessment RUE Assessment: Within Functional Limits LUE Assessment LUE Assessment: Within Functional Limits   Angela Lucero Angela Lucero 02/24/2023, 10:49 AM

## 2023-02-24 NOTE — Group Note (Signed)
Patient Details Name: Angela Lucero MRN: [id number] DOB: 1940/10/29 Today's Date: 02/24/2023  Time Calculation: OT Group Time Calculation OT Group Start Time: 1430 OT Group Stop Time: 1500 OT Group Time Calculation (min): 30 min      Group Description: Dance Group: Pt participated in dance group with an emphasis on social interaction, motor planning, increasing overall activity tolerance and bimanual tasks. All songs were selected by group members. Dance moves included AROM of BUE/BLE gross motor movements with an emphasis on building functional endurance.    Individual level documentation: Patient completed group from sitting level. Patientt needed supervision to complete various dance moves with cues for technique. Pt able to independently verbalize need for rest breaks.  Patient able to create her own modifications during group.   Pain:  0/10  Precautions: Precautions: [name] 02/24/2023, 3:48 PM

## 2023-02-24 NOTE — Progress Notes (Signed)
PROGRESS NOTE   Subjective/Complaints:  No events overnight.  No acute complaints.  Patient continues to have some a.m. anxiety, but feels overall well-controlled and is very happy with being off of oxygen for almost 48 hours. Using Xanax in the a.m. ongoing, no further use of as needed nitroglycerin Saturating greater than 95% on room air overnight Only using tramadol at bedtime  ROS:  + Chest pain/angina - intermittent, responsive to nitroglycerin and Xanax-none since 6/21 + generalized weakness - ongoing + Urinary retention -improved  Denies fevers, chills, SOB, N/V, abdominal pain, diarrhea, cough, chest pain, new weakness or paraesthesias.   As per HPI  Objective:   No results found. No results for input(s): "WBC", "HGB", "HCT", "PLT" in the last 72 hours.   No results for input(s): "NA", "K", "CL", "CO2", "GLUCOSE", "BUN", "CREATININE", "CALCIUM" in the last 72 hours.    Intake/Output Summary (Last 24 hours) at 02/24/2023 0906 Last data filed at 02/24/2023 0726 Gross per 24 hour  Intake 915 ml  Output --  Net 915 ml         Physical Exam: Vital Signs Blood pressure 128/70, pulse 99, temperature 98.1 F (36.7 C), temperature source Oral, resp. rate 19, height 5\' 6"  (1.676 m), weight 74.9 kg, SpO2 95 %. Physical Exam Constitutional: No apparent distress. Appropriate appearance for age. +Obese.  Laying in bed HENT: No JVD. Neck Supple. Trachea midline. Atraumatic, normocephalic. Eyes: PERRLA. EOMI. Visual fields grossly intact.  Cardiovascular: RRR no murmurs/rub/gallops.  1+ pitting edema isolated to the bilateral feet/ankles. Peripheral pulses 2+  - unchanged Respiratory: CTAB.  No rales, rhonchi, or wheezing.  + 0 L [place] Abdomen: + Large hernia/diastasis, normoactive BS throughout. Soft and NTND.  Skin: C/D/I. No apparent lesions. Chronic skin changes on B/L dorsal feet.   MSK:          Strength:  antigravity and against resistance all 4 extremities; 4+/5  Neurologic exam:  Cognition: AAO to person, place, time and event.  Insight: Good  insight into current condition.  Psych: Pleasant mood, appropriate affect.  Anxiety much improved.  Assessment/Plan: 1. Functional deficits which require 3+ hours per day of interdisciplinary therapy in a comprehensive inpatient rehab setting. Physiatrist is providing close team supervision and 24 hour management of active medical problems listed below. Physiatrist and rehab team continue to assess barriers to discharge/monitor patient progress toward functional and medical goals  Care Tool:  Bathing  Bathing activity did not occur: Refused Body parts bathed by patient: Chest, Right arm, Left arm, Abdomen, Front perineal area, Right upper leg, Left upper leg, Buttocks, Right lower leg, Left lower leg, Face   Body parts bathed by helper: Left lower leg, Right lower leg, Buttocks     Bathing assist Assist Level: Contact Guard/Touching assist     Upper Body Dressing/Undressing Upper body dressing   What is the patient wearing?: Dress    Upper body assist Assist Level: Supervision/Verbal cueing    Lower Body Dressing/Undressing Lower body dressing      What is the patient wearing?: Pants     Lower body assist Assist for lower body dressing: Contact Guard/Touching assist     Toileting Toileting  Toileting assist Assist for toileting: Contact Guard/Touching assist     Transfers Chair/bed transfer  Transfers assist     Chair/bed transfer assist level: Supervision/Verbal cueing     Locomotion Ambulation   Ambulation assist      Assist level: Supervision/Verbal cueing Assistive device: Walker-rolling Max distance: 50'   Walk 10 feet activity   Assist     Assist level: Supervision/Verbal cueing Assistive device: Walker-rolling   Walk 50 feet activity   Assist Walk 50 feet with 2 turns activity did not  occur: Safety/medical concerns (Unable to ambulate >10' or perform stair mobility secondary to global deconditioning)  Assist level: Supervision/Verbal cueing Assistive device: Walker-rolling    Walk 150 feet activity   Assist Walk 150 feet activity did not occur: Safety/medical concerns         Walk 10 feet on uneven surface  activity   Assist Walk 10 feet on uneven surfaces activity did not occur: Safety/medical concerns         Wheelchair     Assist Is the patient using a wheelchair?: Yes Type of Wheelchair: Manual Wheelchair activity did not occur:  (Therapist dependently propelling manual wheelchair secondary to fatigue)  Wheelchair assist level: Dependent - Patient 0% Max wheelchair distance: 150    Wheelchair 50 feet with 2 turns activity    Assist        Assist Level: Dependent - Patient 0%   Wheelchair 150 feet activity     Assist      Assist Level: Dependent - Patient 0%   Blood pressure 128/70, pulse 99, temperature 98.1 F (36.7 C), temperature source Oral, resp. rate 19, height 5\' 6"  (1.676 m), weight 74.9 kg, SpO2 95 %.  Medical Problem List and Plan: 1. Functional deficits secondary to debility/CHF/acute hypoxic respiratory failure             -patient may shower             -ELOS/Goals: 10-14 days, SPV goals PT/OT - 6/28 - 6/14: D/w family, SW plan for discharge to hospice nursing after IPR; no referrals sent while in acute. Family working with Palliative for OP referral; will let us know if they need anything.   - 6/17: RR overnight for chest pain, Sob/tachypnea. Troponins stable 30s, EKG no STEMI. Mild weight increase, mild vascular congestion on CXR improved from last exam. Give lasix 20 mg x1, consult HF team. Responsive to PRN nitroglycerin.  - 6/18: Per son, will need to be intermittent SPV at home with neighbors; patient will be paying caregiver every other day and having her call on off days. Per therapies, self-limiting but  is improving. With PT today CGA 13 ft, stair and car transfer goals at SPV and all otehrs Mod I goals.  - 6/20: Make 15/7 for recurrent CP, SOB - 6/24: Showerd today without oxygen; sating 98-100%! Mostly endurance difficulty, SPV -Mod I with walker up to 55 feet. 6 minute walk test tomorrow.  - 6/26: Doing well without oxygen; aim for DC by end of hospitalization  2.  Antithrombotics: -DVT/anticoagulation:  Pharmaceutical: Lovenox 30mg  QD             -antiplatelet therapy: Aspirin 81 mg daily 3. Pain Management: Tramadol as needed  4. Mood/Behavior/Sleep: Melatonin 5 mg nightly--sleeping well, cont regimen             -antipsychotic agents: N/A - 6/18: Start Buspar 7.5 mg BID for anxiety, Xanax 0.25 mg BID PRN for severe episodes while uptitrating -  6/20: responsive to PRN xanax, ongoing anxiety/recurrent CP/SOB, increase Buspar to 7.5 mg TID  - 6/21: Adjust Xanax to 0.5 mg once daily as needed given consistent use upon waking; can further uptitrate BuSpar in 2 to 3 days - improved, using xanax in AM, tolerating therapies; has not needed nitroglycerin in several days.  Continue current regimen, can continue to uptitrate BuSpar as outpatient.  5. Neuropsych/cognition: This patient is capable of making decisions on her own behalf. 6. Skin/Wound Care: Routine skin checks 7. Fluids/Electrolytes/Nutrition: Routine in and outs with follow-up chemistries - 6/14: Na low-stable 133 this AM; K 3.2 add K dur 20 meq BID x3 days; BUN/Cr stable 1.2-1.5 -02/12/23 Na stable at 132, K 4.1, BUN/Cr improving, will decrease Kdur to QD x2 more days for now and recheck on routine labs on Monday  - 6/17: Na stable, BUN increasing, Cr stable, K WNL - 6/18: Na stable, K WNL - 6/24: Na 134; K 3.8; monitor  8.  COPD exacerbation/tobacco use.  Continue nebulizers as advised.  Check oxygen saturations every shift. Tobacco cessation education; does not want patches.  -02/20/23 sats seem to be doing well, lungs sound good  today, nose irritated from [place] tube, will order vaseline.  -Pending 6-minute walk; so far satting well without oxygen.  Continue encouraged weaning. -6/27: Has been stable on room air for 2 days, with only intermittent use of oxygen for comfort   9.  Hypertension.  Lasix 40 mg twice daily, Imdur 60 mg daily, Cozaar 12.5 mg daily, Hydralazine 10 mg every 8 hours, Toprol-XL 25 mg daily.  Monitor with increased mobility - 6/14: Mildly low today; monitor with diuresis, if symptomatic may need cards to assist in adjusting -6/15-16/24 BPs uptrending, monitor for now - 6/17-20: BP stable/low - may be too low with flomax 0.8 mg. Reduce to 0.4 mg; monitor for orthostasis -6/22-26/24 BP stable/mildly hypotensive, asymptomatic - normotensive   Vitals:   02/20/23 1919 02/20/23 2125 02/21/23 0550 02/21/23 1454  BP: 128/64 (!) 98/48 129/75 (!) 106/59   02/21/23 1934 02/22/23 0538 02/22/23 1434 02/22/23 1500  BP: 111/60 118/64 107/62 107/62   02/22/23 1955 02/23/23 0407 02/23/23 2106 02/24/23 0410  BP: 125/63 137/65 116/64 128/70     10.  Diastolic congestive heart failure/intermittent chest pain.  Ejection fraction 20 to 25%.  Continue diuresis.  Jardiance 25 mg daily.  Follow-up heart failure team. Daily weights.    - 6/14: Likely decreased d/t bed vs standing, monitor trend  -6/15-16/24 wt stable , cont monitoring - 6/17: Lasix 20 mg once today as below. Per HF team recs: "May be demand ischemia with CHF though wt has continued to decrease.  EKG without acute changes.  Not on tele in rehab  - could consider other CAD beside LAD on imdur 60 - could add ranexa 500 BID or increase imdur to 90 mg daily, will give another 30 mg po now then 90 daily" - 6/18-20: 1-2x daily NTG for CP; weights stable; anxiety component as above -6/22-26/24 seems to be doing better with regards to CP; see above - Weights stable  Filed Weights   02/21/23 0509 02/22/23 0525 02/24/23 0412  Weight: 75.9 kg 75.3 kg 74.9 kg       11.  AKI on CKD/hyponatremia.  Follow-up chemistries.  Entresto on hold until renal function improved. - resolved, Cr now stable at 1.3-1.5 - Cr stable 1.5; has been 1.2-1.5; monitor BMP in AM d/t low BP and significant diuresis  12.  Hyperlipidemia.  Lipitor 80mg  QD   13.  Type II diabetes mellitus.  Hemoglobin A1c 6.0.   - 6/14: BG stable, continue SSI and Jardiance 25mg  every day  - 6/15-24/24 CBGs stable, monitor  - 6/26: CBG elevated overnight; monitor for trend; resolved Recent Labs    02/23/23 1700 02/23/23 2143 02/24/23 0613  GLUCAP 107* 132* 128*       14.  Constipation. Failed enema today. Passing gas. Check KUB - mild gaseous distention, small volume stool.  Adjust bowel program - Sennakot S 2 tabs BID, PRN sorbitol.   - 6/14: Small BM and smear yesterday. 1x sorbitol. Add Miralax daily to regimen. Add PRN suppository -02/12/23 LBM this morning, monitor' -02/13/23 multiple BMs overnight, reportedly black/dark colored; pt asymptomatic. H&H showing slight decrease in Hgb 11.7 (previously 12.3-12.8); will recheck H&H at 6hr mark, get fecal occult card done, and monitor closely; if continuing to have melanotic stools or worsening anemia, may need to consult GI/start protonix/stop lovenox; will monitor.  - 6/17: + occult blood x2, HgB stable, start protonix BID - 6/19: Increase miralax to BID, hard stool 6/18 - 6/22-23/24 having daily BMs now, monitor - 6.24: multiple Bms overnight; reduce sennakot S to 1 tab at bedtime 6/27 last BM  15. Urinary retention. High PVR 700 yesterday. Check x3 days, start Flomax 0.4 mg at bedtime. - 6/19: High retention overnight, ISCx1. Increase flomax to 0.8 mg, timed toiletting at bedtime Q4H. UA ordered. -> reduced flomax d/t hypotension - 6/20: UA with rare bacteria, small LE, no Nitrites; given no apparent symptoms other than retention will not treat. Bethenachol contraindicated d/t CAD -PVRs today, low, resolved   LOS: 14 days A FACE TO  FACE EVALUATION WAS PERFORMED  Angela Lucero 02/24/2023, 9:06 AM

## 2023-02-24 NOTE — Progress Notes (Signed)
Physical Therapy Discharge Summary  Patient Details  Name: Angela Lucero MRN: [id number] Date of Birth: 1941-08-05  Date of Discharge from PT service:February 24, 2023  Today's Date: 02/24/2023 PT Individual Time: 1300-1415 PT Individual Time Calculation (min): 75 min    Patient has met 8 of 8 long term goals due to improved activity tolerance, improved balance, improved postural control, increased strength, decreased pain, and ability to compensate for deficits.  Patient to discharge at an ambulatory level Mod/Independent and supv for stair mobility, gait distances >120' and car transfers.   Patient's care partner is independent to provide the necessary  Supv  assistance at discharge.  Reasons goals not met: NA  Recommendation:  Patient will benefit from ongoing skilled PT services in  Cardiac Rehab  to continue to advance safe functional mobility, address ongoing impairments in endurance/activity tolerance, dynamic stability, Lt knee pain management, gait, global strengthening, and minimize fall risk.  Equipment: No equipment provided  Reasons for discharge: treatment goals met and discharge from hospital  Patient/family agrees with progress made and goals achieved: Yes  PT Discharge Precautions/Restrictions Precautions Precautions: Fall Precaution Comments: L knee pain- On RA, but monitor SpO2 Restrictions Weight Bearing Restrictions: No Pain 8/10 Lt knee pain- RN notified and administered pain medication during treatment session.  Pain Interference Pain Interference Pain Effect on Sleep: 2. Occasionally Pain Interference with Therapy Activities: 1. Rarely or not at all Pain Interference with Day-to-Day Activities: 1. Rarely or not at all Vision/Perception  Vision - History Ability to See in Adequate Light: 0 Adequate Perception Perception: Within Functional Limits Praxis Praxis: Intact  Cognition Overall Cognitive Status: Within Functional Limits for tasks  assessed Arousal/Alertness: Awake/alert Orientation Level: Oriented X4 Year: 2024 Month: June Day of Week: Correct Memory: Appears intact Awareness: Appears intact Problem Solving: Appears intact Safety/Judgment: Appears intact Sensation Sensation Light Touch: Appears Intact Hot/Cold: Appears Intact Coordination Gross Motor Movements are Fluid and Coordinated: No Fine Motor Movements are Fluid and Coordinated: Yes Coordination and Movement Description: Global deconditioning, but significantly improved since initial eval Motor  Motor Motor: Other (comment) Motor - Discharge Observations: Continues to have generalized weakness but improved since eval  Mobility Bed Mobility Bed Mobility: Sit to Supine;Supine to Sit;Rolling Right;Rolling Left Rolling Right: Independent Rolling Left: Independent Supine to Sit: Independent Sit to Supine: Independent Transfers Transfers: Stand to Sit;Sit to Stand;Stand Pivot Transfers Sit to Stand: Independent with assistive device Stand to Sit: Independent with assistive device Stand Pivot Transfers: Independent with assistive device Transfer (Assistive device): Rolling walker Locomotion  Gait Ambulation: Yes Gait Assistance: Independent with assistive device;Supervision/Verbal cueing Gait Distance (Feet): 150 Feet Assistive device: Rolling walker Gait Gait: Yes Gait Pattern: Impaired (Felxed trunk, downward gaze, and impaired foot clearance) Gait velocity: decr Stairs / Additional Locomotion Stairs: Yes Stairs Assistance: Supervision/Verbal cueing Stair Management Technique: One rail Left Number of Stairs: 4 Height of Stairs: 6 Ramp: Independent with assistive device Curb: Supervision/Verbal cueing Pick up small object from the floor assist level: Independent with assistive device Pick up small object from the floor assistive device: RW and Control and instrumentation engineer: Yes Wheelchair Assistance: Independent with  Scientist, research (life sciences): Both upper extremities Wheelchair Parts Management: Needs assistance  Trunk/Postural Assessment  Cervical Assessment Cervical Assessment: Within Functional Limits Thoracic Assessment Thoracic Assessment: Within Functional Limits Lumbar Assessment Lumbar Assessment: Exceptions to [facility] (Posterior pelvic tilt) Postural Control Postural Control: Deficits on evaluation Righting Reactions: Delayed, but improved since initial evaluation  Balance Balance Balance Assessed: Yes Static Sitting  Balance Static Sitting - Balance Support: Feet supported Static Sitting - Level of Assistance: 6: Modified independent (Device/Increase time) Dynamic Sitting Balance Dynamic Sitting - Balance Support: Feet supported Dynamic Sitting - Level of Assistance: 6: Modified independent (Device/Increase time) Static Standing Balance Static Standing - Balance Support: During functional activity;Bilateral upper extremity supported Static Standing - Level of Assistance: 6: Modified independent (Device/Increase time) Dynamic Standing Balance Dynamic Standing - Balance Support: During functional activity;Bilateral upper extremity supported Dynamic Standing - Level of Assistance: 6: Modified independent (Device/Increase time) Extremity Assessment  RUE Assessment RUE Assessment: Within Functional Limits LUE Assessment LUE Assessment: Within Functional Limits RLE Assessment RLE Assessment: Exceptions to [facility] General Strength Comments: Global strength- 3+/5 LLE Assessment LLE Assessment: Exceptions to [facility] General Strength Comments: Global strength- 3/5 and significantly limited by knee pain   Skilled Intervention- Patient greeted semi-reclined in bed and agreeable to PT treatment session. Patient transitioned to EOB ModI- Patient then stood from EOB and ambulated to/from her bathroom with RW ModI. Patient wheeled dependently to the ortho gym for time management and energy  conservation. Patient performed a car transfer with RW and supv- Due to Lt knee pain, patient required the use of her UE to place LLE into and out of the car simulator. Patient then ascended/descended a low grade ramp with RW and ModI. Patient ascended/descended x4 steps with LHR and Supv for safety- Patient utilized a step-to pattern with no LOB noted. Patient gait trained x150' with RW and Supv/ModI- Patient required Supv for gait distances >120' secondary to fatigue. Patient provided with a HEP (please see below for details). Patient left sitting upright in wheelchair in day room in preparation for dance group with all needs met.   Access Code: [email or web address] URL: [email or web address] Date: 02/24/2023 Prepared by: Sherron Ales Laketta Soderberg  Exercises - Sit to Stand  - 1 x daily - 7 x weekly - 3 sets - 10 reps - Mini Squat with Chair  - 1 x daily - 7 x weekly - 3 sets - 10 reps - Seated Long Arc Quad  - 1 x daily - 7 x weekly - 3 sets - 10 reps - Seated Hip Adduction Squeeze with Ball  - 1 x daily - 7 x weekly - 3 sets - 10 reps - Seated Hip Abduction with Resistance  - 1 x daily - 7 x weekly - 3 sets - 10 reps - Seated Gluteal Sets  - 1 x daily - 7 x weekly - 3 sets - 10 reps - Seated Heel Raise  - 1 x daily - 7 x weekly - 3 sets - 10 reps   Roux Brandy 02/24/2023, 1:51 PM

## 2023-02-24 NOTE — Progress Notes (Signed)
Inpatient Rehabilitation Discharge Medication Review by a Pharmacist  A complete drug regimen review was completed for this patient to identify any potential clinically significant medication issues.  High Risk Drug Classes Is patient taking? Indication by Medication  Antipsychotic No   Anticoagulant No   Antibiotic No   Opioid Yes Tramadol - PRN pain  Antiplatelet Yes Aspirin - CAD  Hypoglycemics/insulin Yes Empagliflozin - DM  Vasoactive Medication Yes Nitroglycerin - PRN chest pain Isosorbide, losartan, metoprolol - HTN  Chemotherapy No   Other Yes Alprazolam - PRN anxiety Buspirone - mood Melatonin - sleep Pantoprazole - GERD Tamsulosin - urinary retention Anoro Ellipta, Albuterol - SOB Miralax - constipation Atorvastatin - HLD Vit D - supplement     Type of Medication Issue Identified Description of Issue Recommendation(s)  Drug Interaction(s) (clinically significant)     Duplicate Therapy     Allergy     No Medication Administration End Date     Incorrect Dose     Additional Drug Therapy Needed     Significant med changes from prior encounter (inform family/care partners about these prior to discharge). Fenofibrate, glimepiride, Entresto, Spiriva, amlodipine, gabapentin, meloxicam, methocarbamol were stopped at IP discharge Communicate medication changes with patient/family at discharge  Other       Clinically significant medication issues were identified that warrant physician communication and completion of prescribed/recommended actions by midnight of the next day:  No   Pharmacist comments: n/a   Time spent performing this drug regimen review (minutes): 20   Thank you for allowing pharmacy to be a part of this patient's care.  Thelma Barge, PharmD Clinical Pharmacist

## 2023-02-24 NOTE — Progress Notes (Signed)
Patient ID: Angela Lucero, female   DOB: Jun 18, 1941, 82 y.o.   MRN: [id number]   SW met with pt to inform she has  been set up for outpatient therapies at [facility]-cardiac rehab, and reviewed discharge.   Cecile Sheerer, MSW, LCSWA Office: [phone] Cell: [phone] Fax: [phone]

## 2023-02-24 NOTE — Plan of Care (Signed)

## 2023-02-24 NOTE — Progress Notes (Signed)
Occupational Therapy Session Note  Patient Details  Name: Angela Lucero MRN: [id number] Date of Birth: 1940/12/31  Today's Date: 02/24/2023 OT Individual Time: 8295-6213 OT Individual Time Calculation (min): 58 min    Short Term Goals: Week 2:  OT Short Term Goal 1 (Week 2): LTG=STG 2/2 ELOS     Skilled Therapeutic Interventions/Progress Updates:    Pt received lying supine in bed with HOB elevated.  Pt had bright affect and was eager to begin OT session.  Pt stated that she needed to void.  Supine to EOB with (S).  Functional mobility to bathroom using RW with close (S) pt insisted that OTS not place hands on or support her in any way.  Pt completed toileting tasks with (S).  Pt taken to gym and was able to complete dynamic standing balance and functional reach activity placing items from counter level into cabinets and on shelves with use of RW for safety and support.  Activity useful for carryover with functional mobility in home and  iADL tasks such as putting up disshes and retrieving items to make a meal from cabinets and shelves. Pt able to tolerate activity x2 with extended rest breaks related to fatigue and SOB.  SPO2 remained above 98 throughout entire session.  Pt escorted back to room.  Transfer from wheelchair to bed using RW with (S). Pt left lying on L side with call light within reach , bed alarm set and all needs met.    Therapy Documentation Precautions:  Precautions Precautions: Fall Precaution Comments: Monitor SpO2, L knee pain Restrictions Weight Bearing Restrictions: No   Therapy/Group: Individual Therapy  Liam Graham 02/24/2023, 10:36 AM

## 2023-02-25 LAB — GLUCOSE, CAPILLARY: Glucose-Capillary: 115 mg/dL — ABNORMAL HIGH (ref 70–99)

## 2023-02-25 NOTE — Progress Notes (Signed)
PROGRESS NOTE   Subjective/Complaints:  No events overnight.  No acute complaints.   All questions regarding discharge and answered, patient feels prepared for home and is extremely grateful for therapy staff for assisting her in this goal  ROS:  + Chest pain/angina -resolved + generalized weakness - improved + Urinary retention -improved  Denies fevers, chills, SOB, N/V, abdominal pain, diarrhea, cough, chest pain, new weakness or paraesthesias.   As per HPI  Objective:   No results found. No results for input(s): "WBC", "HGB", "HCT", "PLT" in the last 72 hours.   No results for input(s): "NA", "K", "CL", "CO2", "GLUCOSE", "BUN", "CREATININE", "CALCIUM" in the last 72 hours.    Intake/Output Summary (Last 24 hours) at 02/25/2023 1625 Last data filed at 02/25/2023 0754 Gross per 24 hour  Intake 600 ml  Output --  Net 600 ml         Physical Exam: Vital Signs Blood pressure (!) 99/54, pulse 79, temperature 97.7 F (36.5 C), resp. rate 18, height 5\' 6"  (1.676 m), weight 76.1 kg, SpO2 94 %. Physical Exam Constitutional: No apparent distress. Appropriate appearance for age. +Obese.  Laying in bed HENT: No JVD. Neck Supple. Trachea midline. Atraumatic, normocephalic. Eyes: PERRLA. EOMI. Visual fields grossly intact.  Cardiovascular: RRR no murmurs/rub/gallops.  1+ pitting edema isolated to the bilateral feet/ankle - unchanged Peripheral pulses 2+  - unchanged Respiratory: CTAB.  No rales, rhonchi, or wheezing.  On RA Abdomen: + Large hernia/diastasis, normoactive BS throughout. Soft and NTND.  Skin: C/D/I. No apparent lesions. Chronic skin changes on B/L dorsal feet.   MSK:          Strength: antigravity and against resistance all 4 extremities; 4+/5  Neurologic exam:  Cognition: AAO to person, place, time and event.  Insight: Good  insight into current condition.  Psych: Pleasant mood, appropriate affect.   Anxiety much improved.  Assessment/Plan: 1. Functional deficits which require 3+ hours per day of interdisciplinary therapy in a comprehensive inpatient rehab setting. Physiatrist is providing close team supervision and 24 hour management of active medical problems listed below. Physiatrist and rehab team continue to assess barriers to discharge/monitor patient progress toward functional and medical goals  Care Tool:  Bathing  Bathing activity did not occur: Refused Body parts bathed by patient: Left arm, Right arm, Chest, Abdomen, Front perineal area, Buttocks, Right upper leg, Right lower leg, Left upper leg, Face, Left lower leg   Body parts bathed by helper: Left lower leg, Right lower leg, Buttocks     Bathing assist Assist Level: Supervision/Verbal cueing     Upper Body Dressing/Undressing Upper body dressing   What is the patient wearing?: Pull over shirt    Upper body assist Assist Level: Independent    Lower Body Dressing/Undressing Lower body dressing      What is the patient wearing?: Pants, Underwear/pull up     Lower body assist Assist for lower body dressing: Independent with assitive device     Toileting Toileting    Toileting assist Assist for toileting: Independent with assistive device     Transfers Chair/bed transfer  Transfers assist     Chair/bed transfer assist level: Independent with  assistive device Chair/bed transfer assistive device: Geologist, engineering   Ambulation assist      Assist level: Independent with assistive device Assistive device: Walker-rolling Max distance: 150'   Walk 10 feet activity   Assist     Assist level: Independent with assistive device Assistive device: Walker-rolling   Walk 50 feet activity   Assist Walk 50 feet with 2 turns activity did not occur: Safety/medical concerns (Unable to ambulate >10' or perform stair mobility secondary to global deconditioning)  Assist level:  Independent with assistive device Assistive device: Walker-rolling    Walk 150 feet activity   Assist Walk 150 feet activity did not occur: Safety/medical concerns  Assist level: Supervision/Verbal cueing Assistive device: Walker-rolling    Walk 10 feet on uneven surface  activity   Assist Walk 10 feet on uneven surfaces activity did not occur: Safety/medical concerns   Assist level: Independent with assistive device Assistive device: Walker-rolling   Wheelchair     Assist Is the patient using a wheelchair?: Yes Type of Wheelchair: Manual Wheelchair activity did not occur:  (Therapist dependently propelling manual wheelchair secondary to fatigue)  Wheelchair assist level: Independent Max wheelchair distance: 150    Wheelchair 50 feet with 2 turns activity    Assist        Assist Level: Independent   Wheelchair 150 feet activity     Assist      Assist Level: Independent   Blood pressure (!) 99/54, pulse 79, temperature 97.7 F (36.5 C), resp. rate 18, height 5\' 6"  (1.676 m), weight 76.1 kg, SpO2 94 %.  Medical Problem List and Plan: 1. Functional deficits secondary to debility/CHF/acute hypoxic respiratory failure             -patient may shower             -ELOS/Goals: 10-14 days, SPV goals PT/OT - 6/28   - stable for discharge from IRF - 6/14: D/w family, SW plan for discharge to hospice nursing after IPR; no referrals sent while in acute. Family working with Palliative for OP referral; will let us know if they need anything.   - 6/17: RR overnight for chest pain, Sob/tachypnea. Troponins stable 30s, EKG no STEMI. Mild weight increase, mild vascular congestion on CXR improved from last exam. Give lasix 20 mg x1, consult HF team. Responsive to PRN nitroglycerin.  - 6/18: Per son, will need to be intermittent SPV at home with neighbors; patient will be paying caregiver every other day and having her call on off days. Per therapies, self-limiting but  is improving. With PT today CGA 13 ft, stair and car transfer goals at SPV and all otehrs Mod I goals.  - 6/20: Make 15/7 for recurrent CP, SOB - 6/24: Showerd today without oxygen; sating 98-100%! Mostly endurance difficulty, SPV -Mod I with walker up to 55 feet. 6 minute walk test tomorrow.  - 6/26: Doing well without oxygen; aim for DC by end of hospitalization  2.  Antithrombotics: -DVT/anticoagulation:  Pharmaceutical: Lovenox 30mg  QD             -antiplatelet therapy: Aspirin 81 mg daily 3. Pain Management: Tramadol as needed  4. Mood/Behavior/Sleep: Melatonin 5 mg nightly--sleeping well, cont regimen             -antipsychotic agents: N/A - 6/18: Start Buspar 7.5 mg BID for anxiety, Xanax 0.25 mg BID PRN for severe episodes while uptitrating - 6/20: responsive to PRN xanax, ongoing anxiety/recurrent CP/SOB, increase Buspar to  7.5 mg TID  - 6/21: Adjust Xanax to 0.5 mg once daily as needed given consistent use upon waking; can further uptitrate BuSpar in 2 to 3 days - improved, using xanax in AM, tolerating therapies; has not needed nitroglycerin in several days.  Continue current regimen, can continue to uptitrate BuSpar as outpatient.  5. Neuropsych/cognition: This patient is capable of making decisions on her own behalf. 6. Skin/Wound Care: Routine skin checks 7. Fluids/Electrolytes/Nutrition: Routine in and outs with follow-up chemistries - 6/14: Na low-stable 133 this AM; K 3.2 add K dur 20 meq BID x3 days; BUN/Cr stable 1.2-1.5 -02/12/23 Na stable at 132, K 4.1, BUN/Cr improving, will decrease Kdur to QD x2 more days for now and recheck on routine labs on Monday  - 6/17: Na stable, BUN increasing, Cr stable, K WNL - 6/18: Na stable, K WNL - 6/24: Na 134; K 3.8; monitor  8.  COPD exacerbation/tobacco use.  Continue nebulizers as advised.  Check oxygen saturations every shift. Tobacco cessation education; does not want patches.  -02/20/23 sats seem to be doing well, lungs sound good  today, nose irritated from [place] tube, will order vaseline.  -Pending 6-minute walk; so far satting well without oxygen.  Continue encouraged weaning. -6/27: Has been stable on room air for 2 days, with only intermittent use of oxygen for comfort   9.  Hypertension.  Lasix 40 mg twice daily, Imdur 60 mg daily, Cozaar 12.5 mg daily, Hydralazine 10 mg every 8 hours, Toprol-XL 25 mg daily.  Monitor with increased mobility - 6/14: Mildly low today; monitor with diuresis, if symptomatic may need cards to assist in adjusting -6/15-16/24 BPs uptrending, monitor for now - 6/17-20: BP stable/low - may be too low with flomax 0.8 mg. Reduce to 0.4 mg; monitor for orthostasis -6/22-26/24 BP stable/mildly hypotensive, asymptomatic - normotensive   Vitals:   02/21/23 1454 02/21/23 1934 02/22/23 0538 02/22/23 1434  BP: (!) 106/59 111/60 118/64 107/62   02/22/23 1500 02/22/23 1955 02/23/23 0407 02/23/23 2106  BP: 107/62 125/63 137/65 116/64   02/24/23 0410 02/24/23 1610 02/24/23 1939 02/25/23 0415  BP: 128/70 131/77 127/66 (!) 99/54     10.  Diastolic congestive heart failure/intermittent chest pain.  Ejection fraction 20 to 25%.  Continue diuresis.  Jardiance 25 mg daily.  Follow-up heart failure team. Daily weights.    - 6/14: Likely decreased d/t bed vs standing, monitor trend  -6/15-16/24 wt stable , cont monitoring - 6/17: Lasix 20 mg once today as below. Per HF team recs: "May be demand ischemia with CHF though wt has continued to decrease.  EKG without acute changes.  Not on tele in rehab  - could consider other CAD beside LAD on imdur 60 - could add ranexa 500 BID or increase imdur to 90 mg daily, will give another 30 mg po now then 90 daily" - 6/18-20: 1-2x daily NTG for CP; weights stable; anxiety component as above -6/22-26/24 seems to be doing better with regards to CP; see above - Weights stable  Filed Weights   02/22/23 0525 02/24/23 0412 02/25/23 0500  Weight: 75.3 kg 74.9 kg 76.1 kg       11.  AKI on CKD/hyponatremia.  Follow-up chemistries.  Entresto on hold until renal function improved. - resolved, Cr now stable at 1.3-1.5 - Cr stable 1.5; has been 1.2-1.5; monitor BMP in AM d/t low BP and significant diuresis  12.  Hyperlipidemia.  Lipitor 80mg  QD   13.  Type II diabetes mellitus.  Hemoglobin A1c 6.0.   - 6/14: BG stable, continue SSI and Jardiance 25mg  every day  - 6/15-24/24 CBGs stable, monitor  - 6/26: CBG elevated overnight; monitor for trend; resolved Recent Labs    02/24/23 1649 02/24/23 2200 02/25/23 0632  GLUCAP 155* 145* 115*       14.  Constipation. Failed enema today. Passing gas. Check KUB - mild gaseous distention, small volume stool.  Adjust bowel program - Sennakot S 2 tabs BID, PRN sorbitol.   - 6/14: Small BM and smear yesterday. 1x sorbitol. Add Miralax daily to regimen. Add PRN suppository -02/12/23 LBM this morning, monitor' -02/13/23 multiple BMs overnight, reportedly black/dark colored; pt asymptomatic. H&H showing slight decrease in Hgb 11.7 (previously 12.3-12.8); will recheck H&H at 6hr mark, get fecal occult card done, and monitor closely; if continuing to have melanotic stools or worsening anemia, may need to consult GI/start protonix/stop lovenox; will monitor.  - 6/17: + occult blood x2, HgB stable, start protonix BID - 6/19: Increase miralax to BID, hard stool 6/18 - 6/22-23/24 having daily BMs now, monitor - 6.24: multiple Bms overnight; reduce sennakot S to 1 tab at bedtime 6/27 last BM  15. Urinary retention. High PVR 700 yesterday. Check x3 days, start Flomax 0.4 mg at bedtime. - 6/19: High retention overnight, ISCx1. Increase flomax to 0.8 mg, timed toiletting at bedtime Q4H. UA ordered. -> reduced flomax d/t hypotension - 6/20: UA with rare bacteria, small LE, no Nitrites; given no apparent symptoms other than retention will not treat. Bethenachol contraindicated d/t CAD -PVRs today, low, resolved   LOS: 15 days A FACE TO  FACE EVALUATION WAS PERFORMED  Angelina Sheriff 02/25/2023, 4:25 PM

## 2023-02-25 NOTE — Progress Notes (Signed)
Inpatient Rehabilitation Care Coordinator Discharge Note   Patient Details  Name: Zakiah Poorman MRN: [id number] Date of Birth: 07-24-1941   Discharge location: D/c to home  Length of Stay: 14 days  Discharge activity level: ambulatory level Mod/Independent and supv for stair mobility, gait distances >120' and car transfers  Home/community participation: Limited  Patient response WU:JWJXBJ Literacy - How often do you need to have someone help you when you read instructions, pamphlets, or other written material from your doctor or pharmacy?: Never  Patient response YN:WGNFAO Isolation - How often do you feel lonely or isolated from those around you?: Never  Services provided included: MD, RD, PT, OT, RN, CM, Pharmacy, Neuropsych, SW, TR  Financial Services:  Financial Services Utilized: Medicare    Choices offered to/list presented to: patient  Follow-up services arranged:  Outpatient    Outpatient Servicies: [facility] (cardiac rehab) for PT/OT      Patient response to transportation need: Is the patient able to respond to transportation needs?: Yes In the past 12 months, has lack of transportation kept you from medical appointments or from getting medications?: No In the past 12 months, has lack of transportation kept you from meetings, work, or from getting things needed for daily living?: No   Patient/Family verbalized understanding of follow-up arrangements:  Yes  Individual responsible for coordination of the follow-up plan: contact pt [phone]  Confirmed correct DME delivered: Gretchen Short 02/25/2023    Comments (or additional information):fam edu completed.   Summary of Stay    Date/Time Discharge Planning CSW  02/21/23 1536 Pt reports that her plan is to return home, and will have intermittent support from her neighbor who will check in on her once a day, and her son can provide PRN support as he works. Fam edu on 6/25 1pm-4pm with her son. SW will  confirm there are no barriers to discharge. AAC  02/14/23 1203 Pt reports that her plan is to return home, and will have intermittent support from her neighbor who will check in on her once a day, and her son can provide PRN support as he works. SW will confirm there are no barriers to discharge. AAC       Takeshi Teasdale A Lula Olszewski

## 2023-02-25 NOTE — Progress Notes (Signed)
INPATIENT REHABILITATION DISCHARGE NOTE   Discharge instructions by: Dinah Beers Pa-C  Verbalized understanding: yes  Skin care/Wound care healing? None   Pain: 0/10  IV's: none present on d/c   Tubes/Drains: none present   O2: room air   Safety instructions: given by PA  Patient belongings: sent with family   Discharged to: home   Discharged via: wheelchair  Notes:

## 2023-03-02 ENCOUNTER — Telehealth: Payer: Self-pay | Admitting: Physical Medicine and Rehabilitation

## 2023-03-02 MED ORDER — HYDRALAZINE HCL 10 MG PO TABS: 10.0000 mg | ORAL_TABLET | Freq: Three times a day (TID) | ORAL | 0 refills | Status: DC

## 2023-03-02 MED ORDER — BUSPIRONE HCL 7.5 MG PO TABS: 7.5000 mg | ORAL_TABLET | Freq: Three times a day (TID) | ORAL | 0 refills | Status: DC

## 2023-03-02 MED ORDER — ISOSORBIDE MONONITRATE ER 30 MG PO TB24: 90.0000 mg | ORAL_TABLET | Freq: Every day | ORAL | 0 refills | Status: DC

## 2023-03-02 MED ORDER — ATORVASTATIN CALCIUM 80 MG PO TABS: 80.0000 mg | ORAL_TABLET | Freq: Every day | ORAL | 0 refills | Status: DC

## 2023-03-02 NOTE — Telephone Encounter (Signed)
I've called pharmacy and these medication were not received. Would you like to resend?

## 2023-03-02 NOTE — Addendum Note (Signed)
Addended by: Janean Sark on: 03/02/2023 01:18 PM   Modules accepted: Orders

## 2023-03-02 NOTE — Addendum Note (Signed)
Addended by: Elijah Birk on: 03/02/2023 04:50 PM   Modules accepted: Orders

## 2023-03-02 NOTE — Telephone Encounter (Signed)
Patient called in and states she has not been able to get any her medications that were prescribed when she was in the hospital . Patient said she has called [facility] multiple times and they state they have not received any medication

## 2023-03-10 ENCOUNTER — Other Ambulatory Visit: Payer: Self-pay | Admitting: Physical Medicine and Rehabilitation

## 2023-03-10 NOTE — Progress Notes (Deleted)
  Cardiology Office Note:  .   Date:  03/10/2023  ID:  Angela Lucero, DOB 08-19-1941, MRN [id number] PCP: [name], MD  [facility] Providers Cardiologist:  Gypsy Balsam, MD { Click to update primary MD,subspecialty MD or APP then REFRESH:1}   History of Present Illness: .   Angela Lucero is a 82 y.o. female with a past medical history of CAD S/P PTCA and PCI in 1999 & 2005, COPD, CVA, DM 2, gout, hyperlipidemia, hypertension.  02/25/23 echo EF 20-25%, global hypokinesis, moderately elevated PA pressures, severe MR, AV sclerosis 12/15/21 cMRI severe dilatation, severe Bystolic dysfunction, subendocardial LGE consistent with prior infarct and basal atrial septum, mid anterior to/anteroseptum, apical anterior/septum and apex, EF 24% 12/14/2021 left heart cath severe single-vessel CAD with CTO of mid LAD with right to left and left to left collaterals, moderate left circumflex and RCA disease, patent distal RCA stent with mild in-stent restenosis  Hospital admission 02/04/2023 to 02/10/2023 for acute hypoxic respiratory failure, Upon presentation to the ED she was short of breath, acute sharp chest pain, BNP 3951, troponin 147-136, sodium 122, glucose 43. Eventually moved to inpatient rehab on 02/10/2023 to 02/25/2023 for debility, ultimately discharged home after PT OT services at rehab.  02/21/2023 labs sodium 134, creatinine 1.37, BUN 47, GFR 39, hemoglobin 10.9, hematocrit 34.3  ROS: ***  Studies Reviewed: .        *** Risk Assessment/Calculations:   {Does this patient have ATRIAL FIBRILLATION?[phone]} No BP recorded.  {Refresh Note OR Click here to enter BP  :1}***       Physical Exam:   VS:  There were no vitals taken for this visit.   Wt Readings from Last 3 Encounters:  02/25/23 167 lb 12.3 oz (76.1 kg)  02/10/23 196 lb 6.9 oz (89.1 kg)  02/24/22 182 lb 3.2 oz (82.6 kg)    GEN: Well nourished, well developed in no acute distress NECK: No JVD; No carotid bruits CARDIAC: ***RRR,  no murmurs, rubs, gallops RESPIRATORY:  Clear to auscultation without rales, wheezing or rhonchi  ABDOMEN: Soft, non-tender, non-distended EXTREMITIES:  No edema; No deformity   ASSESSMENT AND PLAN: .   CAD-s/P DES x 2 it appears in 2021 HFrEF-most recent EF 20 to 25% Hyperlipidemia- Hypertension-    {Are you ordering a CV Procedure (e.g. stress test, cath, DCCV, TEE, etc)?   Press F2        :[id number]}  Dispo: ***  Signed, Flossie Dibble, NP

## 2023-03-11 ENCOUNTER — Inpatient Hospital Stay: Admit: 2023-03-11 | Payer: Medicare Other | Admitting: Internal Medicine

## 2023-03-11 ENCOUNTER — Ambulatory Visit: Payer: Medicare Other | Admitting: Cardiology

## 2023-03-11 ENCOUNTER — Encounter (HOSPITAL_COMMUNITY): Payer: Self-pay

## 2023-03-11 DIAGNOSIS — R41841 Cognitive communication deficit: Secondary | ICD-10-CM | POA: Diagnosis not present

## 2023-03-11 DIAGNOSIS — R0602 Shortness of breath: Secondary | ICD-10-CM | POA: Diagnosis not present

## 2023-03-11 DIAGNOSIS — I340 Nonrheumatic mitral (valve) insufficiency: Secondary | ICD-10-CM | POA: Diagnosis not present

## 2023-03-11 DIAGNOSIS — Z86711 Personal history of pulmonary embolism: Secondary | ICD-10-CM | POA: Diagnosis not present

## 2023-03-11 DIAGNOSIS — I517 Cardiomegaly: Secondary | ICD-10-CM | POA: Diagnosis not present

## 2023-03-11 DIAGNOSIS — R279 Unspecified lack of coordination: Secondary | ICD-10-CM | POA: Diagnosis not present

## 2023-03-11 DIAGNOSIS — D692 Other nonthrombocytopenic purpura: Secondary | ICD-10-CM | POA: Diagnosis not present

## 2023-03-11 DIAGNOSIS — I5023 Acute on chronic systolic (congestive) heart failure: Secondary | ICD-10-CM | POA: Diagnosis not present

## 2023-03-11 DIAGNOSIS — R739 Hyperglycemia, unspecified: Secondary | ICD-10-CM | POA: Diagnosis not present

## 2023-03-11 DIAGNOSIS — M6259 Muscle wasting and atrophy, not elsewhere classified, multiple sites: Secondary | ICD-10-CM | POA: Diagnosis not present

## 2023-03-11 DIAGNOSIS — J441 Chronic obstructive pulmonary disease with (acute) exacerbation: Secondary | ICD-10-CM | POA: Diagnosis not present

## 2023-03-11 DIAGNOSIS — R531 Weakness: Secondary | ICD-10-CM | POA: Diagnosis not present

## 2023-03-11 DIAGNOSIS — R918 Other nonspecific abnormal finding of lung field: Secondary | ICD-10-CM | POA: Diagnosis not present

## 2023-03-11 DIAGNOSIS — Z87440 Personal history of urinary (tract) infections: Secondary | ICD-10-CM | POA: Diagnosis not present

## 2023-03-11 DIAGNOSIS — Z79899 Other long term (current) drug therapy: Secondary | ICD-10-CM | POA: Diagnosis not present

## 2023-03-11 DIAGNOSIS — K219 Gastro-esophageal reflux disease without esophagitis: Secondary | ICD-10-CM | POA: Diagnosis not present

## 2023-03-11 DIAGNOSIS — I4510 Unspecified right bundle-branch block: Secondary | ICD-10-CM | POA: Diagnosis not present

## 2023-03-11 DIAGNOSIS — I252 Old myocardial infarction: Secondary | ICD-10-CM | POA: Diagnosis not present

## 2023-03-11 DIAGNOSIS — K5909 Other constipation: Secondary | ICD-10-CM | POA: Diagnosis not present

## 2023-03-11 DIAGNOSIS — E785 Hyperlipidemia, unspecified: Secondary | ICD-10-CM | POA: Diagnosis not present

## 2023-03-11 DIAGNOSIS — F419 Anxiety disorder, unspecified: Secondary | ICD-10-CM | POA: Diagnosis not present

## 2023-03-11 DIAGNOSIS — R0902 Hypoxemia: Secondary | ICD-10-CM | POA: Diagnosis not present

## 2023-03-11 DIAGNOSIS — Z7982 Long term (current) use of aspirin: Secondary | ICD-10-CM | POA: Diagnosis not present

## 2023-03-11 DIAGNOSIS — E1165 Type 2 diabetes mellitus with hyperglycemia: Secondary | ICD-10-CM | POA: Diagnosis not present

## 2023-03-11 DIAGNOSIS — R1312 Dysphagia, oropharyngeal phase: Secondary | ICD-10-CM | POA: Diagnosis not present

## 2023-03-11 DIAGNOSIS — Z7951 Long term (current) use of inhaled steroids: Secondary | ICD-10-CM | POA: Diagnosis not present

## 2023-03-11 DIAGNOSIS — I509 Heart failure, unspecified: Secondary | ICD-10-CM | POA: Diagnosis not present

## 2023-03-11 DIAGNOSIS — I5022 Chronic systolic (congestive) heart failure: Secondary | ICD-10-CM | POA: Diagnosis not present

## 2023-03-11 DIAGNOSIS — F17200 Nicotine dependence, unspecified, uncomplicated: Secondary | ICD-10-CM | POA: Diagnosis not present

## 2023-03-11 DIAGNOSIS — Z7401 Bed confinement status: Secondary | ICD-10-CM | POA: Diagnosis not present

## 2023-03-11 DIAGNOSIS — M1990 Unspecified osteoarthritis, unspecified site: Secondary | ICD-10-CM | POA: Diagnosis not present

## 2023-03-11 DIAGNOSIS — D509 Iron deficiency anemia, unspecified: Secondary | ICD-10-CM | POA: Diagnosis not present

## 2023-03-11 DIAGNOSIS — F17210 Nicotine dependence, cigarettes, uncomplicated: Secondary | ICD-10-CM | POA: Diagnosis not present

## 2023-03-11 DIAGNOSIS — E119 Type 2 diabetes mellitus without complications: Secondary | ICD-10-CM | POA: Diagnosis not present

## 2023-03-11 DIAGNOSIS — Z7984 Long term (current) use of oral hypoglycemic drugs: Secondary | ICD-10-CM | POA: Diagnosis not present

## 2023-03-11 DIAGNOSIS — I700 Atherosclerosis of aorta: Secondary | ICD-10-CM | POA: Diagnosis not present

## 2023-03-11 DIAGNOSIS — J189 Pneumonia, unspecified organism: Secondary | ICD-10-CM | POA: Diagnosis not present

## 2023-03-11 DIAGNOSIS — Z7952 Long term (current) use of systemic steroids: Secondary | ICD-10-CM | POA: Diagnosis not present

## 2023-03-11 DIAGNOSIS — R5381 Other malaise: Secondary | ICD-10-CM | POA: Diagnosis not present

## 2023-03-11 DIAGNOSIS — I110 Hypertensive heart disease with heart failure: Secondary | ICD-10-CM | POA: Diagnosis not present

## 2023-03-11 DIAGNOSIS — R0600 Dyspnea, unspecified: Secondary | ICD-10-CM | POA: Diagnosis not present

## 2023-03-12 DIAGNOSIS — I5023 Acute on chronic systolic (congestive) heart failure: Secondary | ICD-10-CM | POA: Diagnosis not present

## 2023-03-12 DIAGNOSIS — I110 Hypertensive heart disease with heart failure: Secondary | ICD-10-CM | POA: Diagnosis not present

## 2023-03-12 DIAGNOSIS — J441 Chronic obstructive pulmonary disease with (acute) exacerbation: Secondary | ICD-10-CM | POA: Diagnosis not present

## 2023-03-13 DIAGNOSIS — I5023 Acute on chronic systolic (congestive) heart failure: Secondary | ICD-10-CM | POA: Diagnosis not present

## 2023-03-13 DIAGNOSIS — I110 Hypertensive heart disease with heart failure: Secondary | ICD-10-CM | POA: Diagnosis not present

## 2023-03-13 DIAGNOSIS — J441 Chronic obstructive pulmonary disease with (acute) exacerbation: Secondary | ICD-10-CM | POA: Diagnosis not present

## 2023-03-14 DIAGNOSIS — I110 Hypertensive heart disease with heart failure: Secondary | ICD-10-CM | POA: Diagnosis not present

## 2023-03-14 DIAGNOSIS — J441 Chronic obstructive pulmonary disease with (acute) exacerbation: Secondary | ICD-10-CM | POA: Diagnosis not present

## 2023-03-14 DIAGNOSIS — I5023 Acute on chronic systolic (congestive) heart failure: Secondary | ICD-10-CM | POA: Diagnosis not present

## 2023-03-15 ENCOUNTER — Encounter (HOSPITAL_COMMUNITY): Payer: Medicare Other

## 2023-03-15 DIAGNOSIS — R918 Other nonspecific abnormal finding of lung field: Secondary | ICD-10-CM | POA: Diagnosis not present

## 2023-03-15 DIAGNOSIS — I700 Atherosclerosis of aorta: Secondary | ICD-10-CM | POA: Diagnosis not present

## 2023-03-15 DIAGNOSIS — R0602 Shortness of breath: Secondary | ICD-10-CM | POA: Diagnosis not present

## 2023-03-15 DIAGNOSIS — I5023 Acute on chronic systolic (congestive) heart failure: Secondary | ICD-10-CM | POA: Diagnosis not present

## 2023-03-15 DIAGNOSIS — I110 Hypertensive heart disease with heart failure: Secondary | ICD-10-CM | POA: Diagnosis not present

## 2023-03-15 DIAGNOSIS — J441 Chronic obstructive pulmonary disease with (acute) exacerbation: Secondary | ICD-10-CM | POA: Diagnosis not present

## 2023-03-15 DIAGNOSIS — I517 Cardiomegaly: Secondary | ICD-10-CM | POA: Diagnosis not present

## 2023-03-16 DIAGNOSIS — R41841 Cognitive communication deficit: Secondary | ICD-10-CM | POA: Insufficient documentation

## 2023-03-16 DIAGNOSIS — F419 Anxiety disorder, unspecified: Secondary | ICD-10-CM | POA: Diagnosis not present

## 2023-03-16 DIAGNOSIS — J189 Pneumonia, unspecified organism: Secondary | ICD-10-CM | POA: Diagnosis not present

## 2023-03-16 DIAGNOSIS — R279 Unspecified lack of coordination: Secondary | ICD-10-CM | POA: Diagnosis not present

## 2023-03-16 DIAGNOSIS — I5023 Acute on chronic systolic (congestive) heart failure: Secondary | ICD-10-CM | POA: Diagnosis not present

## 2023-03-16 DIAGNOSIS — R739 Hyperglycemia, unspecified: Secondary | ICD-10-CM | POA: Diagnosis not present

## 2023-03-16 DIAGNOSIS — I5022 Chronic systolic (congestive) heart failure: Secondary | ICD-10-CM | POA: Diagnosis not present

## 2023-03-16 DIAGNOSIS — M6259 Muscle wasting and atrophy, not elsewhere classified, multiple sites: Secondary | ICD-10-CM

## 2023-03-16 DIAGNOSIS — J441 Chronic obstructive pulmonary disease with (acute) exacerbation: Secondary | ICD-10-CM | POA: Diagnosis not present

## 2023-03-16 DIAGNOSIS — F411 Generalized anxiety disorder: Secondary | ICD-10-CM

## 2023-03-16 DIAGNOSIS — E119 Type 2 diabetes mellitus without complications: Secondary | ICD-10-CM | POA: Diagnosis not present

## 2023-03-16 DIAGNOSIS — D692 Other nonthrombocytopenic purpura: Secondary | ICD-10-CM | POA: Diagnosis not present

## 2023-03-16 DIAGNOSIS — R5381 Other malaise: Secondary | ICD-10-CM | POA: Diagnosis not present

## 2023-03-16 DIAGNOSIS — I110 Hypertensive heart disease with heart failure: Secondary | ICD-10-CM | POA: Diagnosis not present

## 2023-03-16 DIAGNOSIS — I509 Heart failure, unspecified: Secondary | ICD-10-CM | POA: Diagnosis not present

## 2023-03-16 DIAGNOSIS — J449 Chronic obstructive pulmonary disease, unspecified: Secondary | ICD-10-CM | POA: Diagnosis not present

## 2023-03-16 DIAGNOSIS — Z7401 Bed confinement status: Secondary | ICD-10-CM | POA: Diagnosis not present

## 2023-03-16 DIAGNOSIS — F17200 Nicotine dependence, unspecified, uncomplicated: Secondary | ICD-10-CM | POA: Diagnosis not present

## 2023-03-16 DIAGNOSIS — F17210 Nicotine dependence, cigarettes, uncomplicated: Secondary | ICD-10-CM | POA: Insufficient documentation

## 2023-03-16 DIAGNOSIS — I340 Nonrheumatic mitral (valve) insufficiency: Secondary | ICD-10-CM | POA: Diagnosis not present

## 2023-03-16 DIAGNOSIS — R1312 Dysphagia, oropharyngeal phase: Secondary | ICD-10-CM | POA: Diagnosis not present

## 2023-03-16 DIAGNOSIS — E785 Hyperlipidemia, unspecified: Secondary | ICD-10-CM | POA: Diagnosis not present

## 2023-03-16 HISTORY — DX: Other nonthrombocytopenic purpura: D69.2

## 2023-03-16 HISTORY — DX: Muscle wasting and atrophy, not elsewhere classified, multiple sites: M62.59

## 2023-03-16 HISTORY — DX: Generalized anxiety disorder: F41.1

## 2023-03-16 HISTORY — DX: Anxiety disorder, unspecified: F41.9

## 2023-03-16 HISTORY — DX: Hyperglycemia, unspecified: R73.9

## 2023-03-16 HISTORY — DX: Nicotine dependence, cigarettes, uncomplicated: F17.210

## 2023-03-16 HISTORY — DX: Other malaise: R53.81

## 2023-03-16 HISTORY — DX: Nonrheumatic mitral (valve) insufficiency: I34.0

## 2023-03-16 HISTORY — DX: Dysphagia, oropharyngeal phase: R13.12

## 2023-03-16 HISTORY — DX: Pneumonia, unspecified organism: J18.9

## 2023-03-16 HISTORY — DX: Cognitive communication deficit: R41.841

## 2023-03-17 DIAGNOSIS — R5381 Other malaise: Secondary | ICD-10-CM | POA: Diagnosis not present

## 2023-03-17 DIAGNOSIS — J449 Chronic obstructive pulmonary disease, unspecified: Secondary | ICD-10-CM | POA: Diagnosis not present

## 2023-03-17 DIAGNOSIS — I5022 Chronic systolic (congestive) heart failure: Secondary | ICD-10-CM | POA: Diagnosis not present

## 2023-03-17 DIAGNOSIS — I340 Nonrheumatic mitral (valve) insufficiency: Secondary | ICD-10-CM | POA: Diagnosis not present

## 2023-03-21 DIAGNOSIS — J449 Chronic obstructive pulmonary disease, unspecified: Secondary | ICD-10-CM | POA: Diagnosis not present

## 2023-03-21 DIAGNOSIS — F419 Anxiety disorder, unspecified: Secondary | ICD-10-CM | POA: Diagnosis not present

## 2023-03-21 DIAGNOSIS — I5022 Chronic systolic (congestive) heart failure: Secondary | ICD-10-CM | POA: Diagnosis not present

## 2023-03-22 DIAGNOSIS — F419 Anxiety disorder, unspecified: Secondary | ICD-10-CM | POA: Diagnosis not present

## 2023-03-22 DIAGNOSIS — I5022 Chronic systolic (congestive) heart failure: Secondary | ICD-10-CM | POA: Diagnosis not present

## 2023-03-24 DIAGNOSIS — I5022 Chronic systolic (congestive) heart failure: Secondary | ICD-10-CM | POA: Diagnosis not present

## 2023-03-24 DIAGNOSIS — I340 Nonrheumatic mitral (valve) insufficiency: Secondary | ICD-10-CM | POA: Diagnosis not present

## 2023-03-24 DIAGNOSIS — J449 Chronic obstructive pulmonary disease, unspecified: Secondary | ICD-10-CM | POA: Diagnosis not present

## 2023-03-24 DIAGNOSIS — R5381 Other malaise: Secondary | ICD-10-CM | POA: Diagnosis not present

## 2023-03-28 DIAGNOSIS — J441 Chronic obstructive pulmonary disease with (acute) exacerbation: Secondary | ICD-10-CM | POA: Diagnosis not present

## 2023-03-28 DIAGNOSIS — N179 Acute kidney failure, unspecified: Secondary | ICD-10-CM | POA: Diagnosis not present

## 2023-03-28 DIAGNOSIS — F17200 Nicotine dependence, unspecified, uncomplicated: Secondary | ICD-10-CM | POA: Diagnosis not present

## 2023-03-28 DIAGNOSIS — E1165 Type 2 diabetes mellitus with hyperglycemia: Secondary | ICD-10-CM | POA: Diagnosis not present

## 2023-03-28 DIAGNOSIS — N189 Chronic kidney disease, unspecified: Secondary | ICD-10-CM | POA: Diagnosis not present

## 2023-03-28 DIAGNOSIS — E1122 Type 2 diabetes mellitus with diabetic chronic kidney disease: Secondary | ICD-10-CM | POA: Diagnosis not present

## 2023-03-28 DIAGNOSIS — E785 Hyperlipidemia, unspecified: Secondary | ICD-10-CM | POA: Diagnosis not present

## 2023-03-28 DIAGNOSIS — I129 Hypertensive chronic kidney disease with stage 1 through stage 4 chronic kidney disease, or unspecified chronic kidney disease: Secondary | ICD-10-CM | POA: Diagnosis not present

## 2023-03-28 DIAGNOSIS — D631 Anemia in chronic kidney disease: Secondary | ICD-10-CM | POA: Diagnosis not present

## 2023-03-28 DIAGNOSIS — F419 Anxiety disorder, unspecified: Secondary | ICD-10-CM | POA: Diagnosis not present

## 2023-03-28 DIAGNOSIS — R627 Adult failure to thrive: Secondary | ICD-10-CM | POA: Diagnosis not present

## 2023-03-28 DIAGNOSIS — Z9981 Dependence on supplemental oxygen: Secondary | ICD-10-CM | POA: Diagnosis not present

## 2023-03-28 DIAGNOSIS — I5023 Acute on chronic systolic (congestive) heart failure: Secondary | ICD-10-CM | POA: Diagnosis not present

## 2023-03-28 DIAGNOSIS — I5030 Unspecified diastolic (congestive) heart failure: Secondary | ICD-10-CM | POA: Diagnosis not present

## 2023-03-28 DIAGNOSIS — D692 Other nonthrombocytopenic purpura: Secondary | ICD-10-CM | POA: Diagnosis not present

## 2023-03-28 DIAGNOSIS — K5900 Constipation, unspecified: Secondary | ICD-10-CM | POA: Diagnosis not present

## 2023-03-28 DIAGNOSIS — E871 Hypo-osmolality and hyponatremia: Secondary | ICD-10-CM | POA: Diagnosis not present

## 2023-03-28 DIAGNOSIS — I051 Rheumatic mitral insufficiency: Secondary | ICD-10-CM | POA: Diagnosis not present

## 2023-03-28 DIAGNOSIS — Z7951 Long term (current) use of inhaled steroids: Secondary | ICD-10-CM | POA: Diagnosis not present

## 2023-03-29 DIAGNOSIS — I129 Hypertensive chronic kidney disease with stage 1 through stage 4 chronic kidney disease, or unspecified chronic kidney disease: Secondary | ICD-10-CM | POA: Diagnosis not present

## 2023-03-29 DIAGNOSIS — J441 Chronic obstructive pulmonary disease with (acute) exacerbation: Secondary | ICD-10-CM | POA: Diagnosis not present

## 2023-03-29 DIAGNOSIS — I5023 Acute on chronic systolic (congestive) heart failure: Secondary | ICD-10-CM | POA: Diagnosis not present

## 2023-03-29 DIAGNOSIS — N189 Chronic kidney disease, unspecified: Secondary | ICD-10-CM | POA: Diagnosis not present

## 2023-03-29 DIAGNOSIS — I5030 Unspecified diastolic (congestive) heart failure: Secondary | ICD-10-CM | POA: Diagnosis not present

## 2023-03-29 DIAGNOSIS — E1122 Type 2 diabetes mellitus with diabetic chronic kidney disease: Secondary | ICD-10-CM | POA: Diagnosis not present

## 2023-03-30 ENCOUNTER — Other Ambulatory Visit: Payer: Self-pay | Admitting: Cardiology

## 2023-04-01 DIAGNOSIS — I509 Heart failure, unspecified: Secondary | ICD-10-CM | POA: Diagnosis not present

## 2023-04-01 DIAGNOSIS — R918 Other nonspecific abnormal finding of lung field: Secondary | ICD-10-CM | POA: Diagnosis not present

## 2023-04-01 DIAGNOSIS — Z79899 Other long term (current) drug therapy: Secondary | ICD-10-CM | POA: Diagnosis not present

## 2023-04-01 DIAGNOSIS — I340 Nonrheumatic mitral (valve) insufficiency: Secondary | ICD-10-CM | POA: Diagnosis not present

## 2023-04-01 DIAGNOSIS — I5043 Acute on chronic combined systolic (congestive) and diastolic (congestive) heart failure: Secondary | ICD-10-CM | POA: Diagnosis not present

## 2023-04-01 DIAGNOSIS — I444 Left anterior fascicular block: Secondary | ICD-10-CM | POA: Diagnosis not present

## 2023-04-01 DIAGNOSIS — Z87891 Personal history of nicotine dependence: Secondary | ICD-10-CM | POA: Diagnosis not present

## 2023-04-01 DIAGNOSIS — Z7982 Long term (current) use of aspirin: Secondary | ICD-10-CM | POA: Diagnosis not present

## 2023-04-01 DIAGNOSIS — J449 Chronic obstructive pulmonary disease, unspecified: Secondary | ICD-10-CM | POA: Diagnosis not present

## 2023-04-01 DIAGNOSIS — K449 Diaphragmatic hernia without obstruction or gangrene: Secondary | ICD-10-CM | POA: Diagnosis not present

## 2023-04-01 DIAGNOSIS — E785 Hyperlipidemia, unspecified: Secondary | ICD-10-CM | POA: Diagnosis not present

## 2023-04-01 DIAGNOSIS — J90 Pleural effusion, not elsewhere classified: Secondary | ICD-10-CM | POA: Diagnosis not present

## 2023-04-01 DIAGNOSIS — G4489 Other headache syndrome: Secondary | ICD-10-CM | POA: Diagnosis not present

## 2023-04-01 DIAGNOSIS — I5189 Other ill-defined heart diseases: Secondary | ICD-10-CM | POA: Diagnosis not present

## 2023-04-01 DIAGNOSIS — R739 Hyperglycemia, unspecified: Secondary | ICD-10-CM | POA: Diagnosis not present

## 2023-04-01 DIAGNOSIS — R0602 Shortness of breath: Secondary | ICD-10-CM | POA: Diagnosis not present

## 2023-04-01 DIAGNOSIS — I214 Non-ST elevation (NSTEMI) myocardial infarction: Secondary | ICD-10-CM | POA: Diagnosis not present

## 2023-04-01 DIAGNOSIS — I4510 Unspecified right bundle-branch block: Secondary | ICD-10-CM | POA: Diagnosis not present

## 2023-04-01 DIAGNOSIS — I491 Atrial premature depolarization: Secondary | ICD-10-CM | POA: Diagnosis not present

## 2023-04-01 DIAGNOSIS — Z20822 Contact with and (suspected) exposure to covid-19: Secondary | ICD-10-CM | POA: Diagnosis not present

## 2023-04-01 DIAGNOSIS — R000 Tachycardia, unspecified: Secondary | ICD-10-CM | POA: Diagnosis not present

## 2023-04-01 DIAGNOSIS — R079 Chest pain, unspecified: Secondary | ICD-10-CM | POA: Diagnosis not present

## 2023-04-01 DIAGNOSIS — I10 Essential (primary) hypertension: Secondary | ICD-10-CM | POA: Diagnosis not present

## 2023-04-01 DIAGNOSIS — R9431 Abnormal electrocardiogram [ECG] [EKG]: Secondary | ICD-10-CM | POA: Diagnosis not present

## 2023-04-01 DIAGNOSIS — I447 Left bundle-branch block, unspecified: Secondary | ICD-10-CM | POA: Diagnosis not present

## 2023-04-01 DIAGNOSIS — J439 Emphysema, unspecified: Secondary | ICD-10-CM | POA: Diagnosis not present

## 2023-04-01 DIAGNOSIS — I5023 Acute on chronic systolic (congestive) heart failure: Secondary | ICD-10-CM | POA: Diagnosis not present

## 2023-04-01 DIAGNOSIS — I110 Hypertensive heart disease with heart failure: Secondary | ICD-10-CM | POA: Diagnosis not present

## 2023-04-01 DIAGNOSIS — I2510 Atherosclerotic heart disease of native coronary artery without angina pectoris: Secondary | ICD-10-CM | POA: Diagnosis not present

## 2023-04-01 DIAGNOSIS — R590 Localized enlarged lymph nodes: Secondary | ICD-10-CM | POA: Diagnosis not present

## 2023-04-01 DIAGNOSIS — Z955 Presence of coronary angioplasty implant and graft: Secondary | ICD-10-CM | POA: Diagnosis not present

## 2023-04-02 DIAGNOSIS — J449 Chronic obstructive pulmonary disease, unspecified: Secondary | ICD-10-CM | POA: Diagnosis not present

## 2023-04-02 DIAGNOSIS — I10 Essential (primary) hypertension: Secondary | ICD-10-CM | POA: Diagnosis not present

## 2023-04-02 DIAGNOSIS — E785 Hyperlipidemia, unspecified: Secondary | ICD-10-CM | POA: Diagnosis not present

## 2023-04-02 DIAGNOSIS — I2510 Atherosclerotic heart disease of native coronary artery without angina pectoris: Secondary | ICD-10-CM | POA: Diagnosis not present

## 2023-04-02 DIAGNOSIS — N189 Chronic kidney disease, unspecified: Secondary | ICD-10-CM | POA: Diagnosis not present

## 2023-04-02 DIAGNOSIS — I214 Non-ST elevation (NSTEMI) myocardial infarction: Secondary | ICD-10-CM | POA: Diagnosis not present

## 2023-04-02 DIAGNOSIS — I340 Nonrheumatic mitral (valve) insufficiency: Secondary | ICD-10-CM | POA: Diagnosis not present

## 2023-04-02 DIAGNOSIS — I5023 Acute on chronic systolic (congestive) heart failure: Secondary | ICD-10-CM | POA: Diagnosis not present

## 2023-04-02 DIAGNOSIS — I5189 Other ill-defined heart diseases: Secondary | ICD-10-CM | POA: Diagnosis not present

## 2023-04-02 DIAGNOSIS — J90 Pleural effusion, not elsewhere classified: Secondary | ICD-10-CM | POA: Diagnosis not present

## 2023-04-03 DIAGNOSIS — N189 Chronic kidney disease, unspecified: Secondary | ICD-10-CM | POA: Diagnosis not present

## 2023-04-03 DIAGNOSIS — J449 Chronic obstructive pulmonary disease, unspecified: Secondary | ICD-10-CM | POA: Diagnosis not present

## 2023-04-03 DIAGNOSIS — E785 Hyperlipidemia, unspecified: Secondary | ICD-10-CM | POA: Diagnosis not present

## 2023-04-03 DIAGNOSIS — I214 Non-ST elevation (NSTEMI) myocardial infarction: Secondary | ICD-10-CM | POA: Diagnosis not present

## 2023-04-03 DIAGNOSIS — I2510 Atherosclerotic heart disease of native coronary artery without angina pectoris: Secondary | ICD-10-CM | POA: Diagnosis not present

## 2023-04-03 DIAGNOSIS — I5023 Acute on chronic systolic (congestive) heart failure: Secondary | ICD-10-CM | POA: Diagnosis not present

## 2023-04-03 DIAGNOSIS — I10 Essential (primary) hypertension: Secondary | ICD-10-CM | POA: Diagnosis not present

## 2023-04-03 DIAGNOSIS — I5189 Other ill-defined heart diseases: Secondary | ICD-10-CM | POA: Diagnosis not present

## 2023-04-03 DIAGNOSIS — I340 Nonrheumatic mitral (valve) insufficiency: Secondary | ICD-10-CM | POA: Diagnosis not present

## 2023-04-03 DIAGNOSIS — J90 Pleural effusion, not elsewhere classified: Secondary | ICD-10-CM | POA: Diagnosis not present

## 2023-04-04 DIAGNOSIS — I5023 Acute on chronic systolic (congestive) heart failure: Secondary | ICD-10-CM | POA: Diagnosis not present

## 2023-04-04 DIAGNOSIS — I340 Nonrheumatic mitral (valve) insufficiency: Secondary | ICD-10-CM | POA: Diagnosis not present

## 2023-04-04 DIAGNOSIS — J90 Pleural effusion, not elsewhere classified: Secondary | ICD-10-CM | POA: Diagnosis not present

## 2023-04-04 DIAGNOSIS — I10 Essential (primary) hypertension: Secondary | ICD-10-CM | POA: Diagnosis not present

## 2023-04-04 DIAGNOSIS — I2510 Atherosclerotic heart disease of native coronary artery without angina pectoris: Secondary | ICD-10-CM | POA: Diagnosis not present

## 2023-04-04 DIAGNOSIS — E785 Hyperlipidemia, unspecified: Secondary | ICD-10-CM | POA: Diagnosis not present

## 2023-04-04 DIAGNOSIS — I214 Non-ST elevation (NSTEMI) myocardial infarction: Secondary | ICD-10-CM | POA: Diagnosis not present

## 2023-04-05 DIAGNOSIS — I10 Essential (primary) hypertension: Secondary | ICD-10-CM | POA: Diagnosis not present

## 2023-04-05 DIAGNOSIS — I2510 Atherosclerotic heart disease of native coronary artery without angina pectoris: Secondary | ICD-10-CM | POA: Diagnosis not present

## 2023-04-05 DIAGNOSIS — I214 Non-ST elevation (NSTEMI) myocardial infarction: Secondary | ICD-10-CM | POA: Diagnosis not present

## 2023-04-05 DIAGNOSIS — I5023 Acute on chronic systolic (congestive) heart failure: Secondary | ICD-10-CM | POA: Diagnosis not present

## 2023-04-05 DIAGNOSIS — J90 Pleural effusion, not elsewhere classified: Secondary | ICD-10-CM | POA: Diagnosis not present

## 2023-04-05 DIAGNOSIS — I340 Nonrheumatic mitral (valve) insufficiency: Secondary | ICD-10-CM | POA: Diagnosis not present

## 2023-04-05 DIAGNOSIS — E785 Hyperlipidemia, unspecified: Secondary | ICD-10-CM | POA: Diagnosis not present

## 2023-04-05 DIAGNOSIS — R079 Chest pain, unspecified: Secondary | ICD-10-CM | POA: Diagnosis not present

## 2023-04-06 ENCOUNTER — Encounter: Payer: Self-pay | Admitting: Internal Medicine

## 2023-04-06 NOTE — Progress Notes (Deleted)
  Cardiology Office Note:  .   Date:  04/06/2023  ID:  Angela Lucero, DOB [birth year], MRN [id number] PCP: [name], MD  [facility] Providers Cardiologist:  Gypsy Balsam, MD { Click to update primary MD,subspecialty MD or APP then REFRESH:1}   History of Present Illness: .   Angela Lucero is a 82 y.o. female with a past medical history of CAD S/P PTCA and PCI in 1999 & 2005, COPD, CVA, DM 2, gout, hyperlipidemia, hypertension.  04/05/2023 stress evaluation revealed large fixed severe defect on the anterior apical wall with no definitive reversible changes 04/05/2023 echocardiogram EF of 20 to 25%, severe pulmonary hypertension with systolic pressure 63 mmHg, severe mitral regurgitation 02/25/23 echo EF 20-25%, global hypokinesis, moderately elevated PA pressures, severe MR, AV sclerosis 12/15/21 cMRI severe dilatation, severe Bystolic dysfunction, subendocardial LGE consistent with prior infarct and basal atrial septum, mid anterior to/anteroseptum, apical anterior/septum and apex, EF 24% 12/14/2021 left heart cath severe single-vessel CAD with CTO of mid LAD with right to left and left to left collaterals, moderate left circumflex and RCA disease, patent distal RCA stent with mild in-stent restenosis  Hospital admission 02/04/2023 to 02/10/2023 for acute hypoxic respiratory failure, Upon presentation to the ED she was short of breath, acute sharp chest pain, BNP 3951, troponin 147-136, sodium 122, glucose 43. Eventually moved to inpatient rehab on 02/10/2023 to 02/25/2023 for debility, ultimately discharged home after PT OT services at rehab.    She was readmitted to [facility] on 04/01/23 for heart failure exacerbation, proBNP was 15,300.  Troponins trended 0.14> 3.42, consistent with non-STEMI, she was darted on heparin and placed on the Lasix drip.  Stress test was completed on 04/05/2023 showed large fixed severe defect of the anterior apical wall with no definitive reversible changes.   Echocardiogram revealed an EF of 20 to 25%, severe pulmonary hypertension with systolic pressure 63 mmHg, severe mitral regurgitation.  She did have an episode of NSVT, felt to be exacerbated by severe hypomagnesemia.  LDL 35, labs on day of discharge revealed creatinine 1.40, potassium 3.9, sodium 135.  EP evaluation to consider CRT therapy after GDMT is maximized.  ROS: ***  Studies Reviewed: .        *** Risk Assessment/Calculations:   {Does this patient have ATRIAL FIBRILLATION?[phone]} No BP recorded.  {Refresh Note OR Click here to enter BP  :1}***       Physical Exam:   VS:  There were no vitals taken for this visit.   Wt Readings from Last 3 Encounters:  02/25/23 167 lb 12.3 oz (76.1 kg)  02/10/23 196 lb 6.9 oz (89.1 kg)  02/24/22 182 lb 3.2 oz (82.6 kg)    GEN: Well nourished, well developed in no acute distress NECK: No JVD; No carotid bruits CARDIAC: ***RRR, no murmurs, rubs, gallops RESPIRATORY:  Clear to auscultation without rales, wheezing or rhonchi  ABDOMEN: Soft, non-tender, non-distended EXTREMITIES:  No edema; No deformity   ASSESSMENT AND PLAN: .   CAD-s/P DES x 2 it appears in 2021; stress evaluation on 04/05/2023 revealed large fixed severe defect on the anterior apical wall with no definitive reversible changes HFrEF-most recent EF 20 to 25% Hyperlipidemia-recent LDL was well-controlled 35 on 04/05/2023 Hypertension- Tobacco abuse -      {Are you ordering a CV Procedure (e.g. stress test, cath, DCCV, TEE, etc)?   Press F2        :[id number]}  Dispo: ***  Signed, Flossie Dibble, NP

## 2023-04-07 ENCOUNTER — Ambulatory Visit: Payer: Medicare Other | Admitting: Cardiology

## 2023-04-07 DIAGNOSIS — I255 Ischemic cardiomyopathy: Secondary | ICD-10-CM

## 2023-04-07 DIAGNOSIS — I5020 Unspecified systolic (congestive) heart failure: Secondary | ICD-10-CM

## 2023-04-07 DIAGNOSIS — F17200 Nicotine dependence, unspecified, uncomplicated: Secondary | ICD-10-CM

## 2023-04-07 DIAGNOSIS — I10 Essential (primary) hypertension: Secondary | ICD-10-CM

## 2023-04-07 DIAGNOSIS — E785 Hyperlipidemia, unspecified: Secondary | ICD-10-CM

## 2023-04-07 DIAGNOSIS — I2510 Atherosclerotic heart disease of native coronary artery without angina pectoris: Secondary | ICD-10-CM

## 2023-04-15 DIAGNOSIS — Z7984 Long term (current) use of oral hypoglycemic drugs: Secondary | ICD-10-CM | POA: Diagnosis not present

## 2023-04-15 DIAGNOSIS — J449 Chronic obstructive pulmonary disease, unspecified: Secondary | ICD-10-CM | POA: Diagnosis not present

## 2023-04-15 DIAGNOSIS — E1122 Type 2 diabetes mellitus with diabetic chronic kidney disease: Secondary | ICD-10-CM | POA: Diagnosis not present

## 2023-04-15 DIAGNOSIS — R457 State of emotional shock and stress, unspecified: Secondary | ICD-10-CM | POA: Diagnosis not present

## 2023-04-15 DIAGNOSIS — Z87440 Personal history of urinary (tract) infections: Secondary | ICD-10-CM | POA: Diagnosis not present

## 2023-04-15 DIAGNOSIS — I440 Atrioventricular block, first degree: Secondary | ICD-10-CM | POA: Diagnosis not present

## 2023-04-15 DIAGNOSIS — R9431 Abnormal electrocardiogram [ECG] [EKG]: Secondary | ICD-10-CM | POA: Diagnosis not present

## 2023-04-15 DIAGNOSIS — I2510 Atherosclerotic heart disease of native coronary artery without angina pectoris: Secondary | ICD-10-CM | POA: Diagnosis not present

## 2023-04-15 DIAGNOSIS — I5023 Acute on chronic systolic (congestive) heart failure: Secondary | ICD-10-CM | POA: Diagnosis not present

## 2023-04-15 DIAGNOSIS — I252 Old myocardial infarction: Secondary | ICD-10-CM | POA: Diagnosis not present

## 2023-04-15 DIAGNOSIS — Z7982 Long term (current) use of aspirin: Secondary | ICD-10-CM | POA: Diagnosis not present

## 2023-04-15 DIAGNOSIS — Z86711 Personal history of pulmonary embolism: Secondary | ICD-10-CM | POA: Diagnosis not present

## 2023-04-15 DIAGNOSIS — R918 Other nonspecific abnormal finding of lung field: Secondary | ICD-10-CM | POA: Diagnosis not present

## 2023-04-15 DIAGNOSIS — R0602 Shortness of breath: Secondary | ICD-10-CM | POA: Diagnosis not present

## 2023-04-15 DIAGNOSIS — Z79899 Other long term (current) drug therapy: Secondary | ICD-10-CM | POA: Diagnosis not present

## 2023-04-15 DIAGNOSIS — F419 Anxiety disorder, unspecified: Secondary | ICD-10-CM | POA: Diagnosis not present

## 2023-04-15 DIAGNOSIS — E785 Hyperlipidemia, unspecified: Secondary | ICD-10-CM | POA: Diagnosis not present

## 2023-04-15 DIAGNOSIS — I509 Heart failure, unspecified: Secondary | ICD-10-CM | POA: Diagnosis not present

## 2023-04-15 DIAGNOSIS — R609 Edema, unspecified: Secondary | ICD-10-CM | POA: Diagnosis not present

## 2023-04-15 DIAGNOSIS — N183 Chronic kidney disease, stage 3 unspecified: Secondary | ICD-10-CM | POA: Diagnosis not present

## 2023-04-15 DIAGNOSIS — I130 Hypertensive heart and chronic kidney disease with heart failure and stage 1 through stage 4 chronic kidney disease, or unspecified chronic kidney disease: Secondary | ICD-10-CM | POA: Diagnosis not present

## 2023-04-15 DIAGNOSIS — I517 Cardiomegaly: Secondary | ICD-10-CM | POA: Diagnosis not present

## 2023-04-15 DIAGNOSIS — I4510 Unspecified right bundle-branch block: Secondary | ICD-10-CM | POA: Diagnosis not present

## 2023-04-15 DIAGNOSIS — Z9981 Dependence on supplemental oxygen: Secondary | ICD-10-CM | POA: Diagnosis not present

## 2023-04-15 DIAGNOSIS — M1990 Unspecified osteoarthritis, unspecified site: Secondary | ICD-10-CM | POA: Diagnosis not present

## 2023-04-15 DIAGNOSIS — F17210 Nicotine dependence, cigarettes, uncomplicated: Secondary | ICD-10-CM | POA: Diagnosis not present

## 2023-04-19 ENCOUNTER — Encounter: Payer: Self-pay | Admitting: *Deleted

## 2023-04-19 ENCOUNTER — Ambulatory Visit: Payer: Medicare Other | Attending: Cardiology | Admitting: Cardiology

## 2023-04-19 VITALS — BP 100/70 | HR 80 | Ht 66.0 in | Wt 173.0 lb

## 2023-04-19 DIAGNOSIS — I255 Ischemic cardiomyopathy: Secondary | ICD-10-CM

## 2023-04-19 DIAGNOSIS — I2510 Atherosclerotic heart disease of native coronary artery without angina pectoris: Secondary | ICD-10-CM

## 2023-04-19 DIAGNOSIS — R0609 Other forms of dyspnea: Secondary | ICD-10-CM | POA: Diagnosis present

## 2023-04-19 DIAGNOSIS — R944 Abnormal results of kidney function studies: Secondary | ICD-10-CM | POA: Diagnosis present

## 2023-04-19 DIAGNOSIS — Z79899 Other long term (current) drug therapy: Secondary | ICD-10-CM | POA: Diagnosis present

## 2023-04-19 MED ORDER — SPIRONOLACTONE 25 MG PO TABS: 25.0000 mg | ORAL_TABLET | Freq: Every day | ORAL | 0 refills | Status: DC

## 2023-04-19 MED ORDER — SPIRONOLACTONE 25 MG PO TABS: 25.0000 mg | ORAL_TABLET | Freq: Every day | ORAL | 3 refills | Status: DC

## 2023-04-19 NOTE — Progress Notes (Unsigned)
Cardiology Office Note:  .   Date:  04/20/2023  ID:  Alease Kantner, DOB 19-Aug-1941, MRN [id number] PCP: [name], MD  [facility] Providers Cardiologist:  Gypsy Balsam, MD    History of Present Illness: .   Angela Lucero is a 82 y.o. female with a past medical history of CAD S/P PTCA and PCI in 1999 & 2005, COPD oxygen dependent, CVA, DM 2, gout, hyperlipidemia, hypertension.  04/05/2023 stress evaluation revealed large fixed severe defect on the anterior apical wall with no definitive reversible changes 04/05/2023 echocardiogram EF of 20 to 25%, severe pulmonary hypertension with systolic pressure 63 mmHg, severe mitral regurgitation 02/25/23 echo EF 20-25%, global hypokinesis, moderately elevated PA pressures, severe MR, AV sclerosis 12/15/21 cMRI severe dilatation, severe diastolic dysfunction, subendocardial LGE consistent with prior infarct and basal atrial septum, mid anterior to/anteroseptum, apical anterior/septum and apex, EF 24% 12/14/2021 left heart cath severe single-vessel CAD with CTO of mid LAD with right to left and left to left collaterals, moderate left circumflex and RCA disease, patent distal RCA stent with mild in-stent restenosis  Hospital admission 02/04/2023 to 02/10/2023 for acute hypoxic respiratory failure, Upon presentation to the ED she was short of breath, acute sharp chest pain, BNP 3951, troponin 147-136, sodium 122, glucose 43. Eventually moved to inpatient rehab on 02/10/2023 to 02/25/2023 for debility, ultimately discharged home after PT OT services at rehab.    She was readmitted to [facility] on 04/01/23 for heart failure exacerbation, proBNP was 15,300.  Troponins trended 0.14> 3.42, consistent with non-STEMI, she was darted on heparin and placed on the Lasix drip.  Stress test was completed on 04/05/2023 showed large fixed severe defect of the anterior apical wall with no definitive reversible changes.  Echocardiogram revealed an EF of 20 to 25%, severe pulmonary  hypertension with systolic pressure 63 mmHg, severe mitral regurgitation.  She did have an episode of NSVT, felt to be exacerbated by severe hypomagnesemia.  LDL 35, labs on day of discharge revealed creatinine 1.40, potassium 3.9, sodium 135.  EP evaluation to consider CRT therapy after GDMT is maximized.  Admitted [facility] 04/15/2023, some confusion around how she was taking her medications and had not been taking her diuretics.  She required IV Lasix. Potassium 4.1, creatinine 1.20, proBNP 12,700, LDL 36.  She presents today accompanied by her son for follow-up of her heart failure.  Is been a very stressful summer for her, she has been in and out of the hospital on multiple occasions for heart failure exacerbation.  She continues to have some pretty significant pedal edema, she is weighing herself daily, she is adhering to fluid restriction, adhering to sodium restriction. She denies chest pain, palpitations, dyspnea, pnd, n, v, dizziness, syncope, weight gain, or early satiety.   ROS: Review of Systems  Constitutional:  Positive for malaise/fatigue.  HENT: Negative.    Eyes: Negative.   Respiratory:  Positive for shortness of breath.   Cardiovascular:  Positive for leg swelling.  Gastrointestinal: Negative.   Genitourinary: Negative.   Musculoskeletal: Negative.   Skin: Negative.   Neurological: Negative.   Endo/Heme/Allergies:  Bruises/bleeds easily.  Psychiatric/Behavioral: Negative.       Studies Reviewed: .          Risk Assessment/Calculations:             Physical Exam:   VS:  BP 100/70 (BP Location: Left Arm, Patient Position: Sitting, Cuff Size: Normal)   Pulse 80   Ht 5\' 6"  (1.676 m)  Wt 173 lb (78.5 kg)   SpO2 96%   BMI 27.92 kg/m    Wt Readings from Last 3 Encounters:  04/19/23 173 lb (78.5 kg)  02/25/23 167 lb 12.3 oz (76.1 kg)  02/10/23 196 lb 6.9 oz (89.1 kg)    GEN: Well nourished, well developed in no acute distress NECK: No JVD; No carotid  bruits CARDIAC: RRR, no murmurs, rubs, gallops RESPIRATORY:  Clear to auscultation without rales, wheezing or rhonchi  ABDOMEN: Soft, non-tender, non-distended EXTREMITIES: +2 pedal edema mid tibia; No deformity   ASSESSMENT AND PLAN: .   CAD-s/P DES x 2 it appears in 2021; stress evaluation on 04/05/2023 revealed large fixed severe defect on the anterior apical wall with no definitive reversible changes. Stable with no anginal symptoms. No indication for ischemic evaluation.  Continue aspirin 81 mg daily, continue Imdur 90 mg daily, continue metoprolol 25 mg daily. 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  Continue Genia Del is on 25 mg daily for DM, continue Entresto 24-26 mg twice daily, continue metoprolol 25 mg daily.  Will start spironolactone 12.5 mg daily, GFR 43, potassium 3.8.  Will repeat BMET in 3 days, again in 10 days. Hyperlipidemia-recent LDL was well-controlled 35 on 04/05/2023, continue Lipitor 80 mg daily. Hypertension-blood pressure is actually low today, we will stop her hydralazine. COPD-currently oxygen dependent, followed by Dr. Winfred Leeds        Dispo: Labs per above, spironolactone per above  Signed, Flossie Dibble, NP

## 2023-04-19 NOTE — Patient Instructions (Signed)
Medication Instructions:  Your physician has recommended you make the following change in your medication:  Stop the Hydralazine  *If you need a refill on your cardiac medications before your next appointment, please call your pharmacy*   Lab Work: Your physician recommends that you return for lab work in: Friday, Aug. 23rd for [facility] Then again on Friday, 8/30 for BMP and ProBNP Lab opens at 8am. You DO NOT NEED an appointment. Best time to come is between 8am and 12noon and between 1:30 and 4:30. If you have been asked to fast for your blood work please have nothing to eat or drink after midnight. You may have water.   If you have labs (blood work) drawn today and your tests are completely normal, you will receive your results only by: MyChart Message (if you have MyChart) OR A paper copy in the mail If you have any lab test that is abnormal or we need to change your treatment, we will call you to review the results.   Testing/Procedures: NONE   Follow-Up: At [facility], you and your health needs are our priority.  As part of our continuing mission to provide you with exceptional heart care, we have created designated Provider Care Teams.  These Care Teams include your primary Cardiologist (physician) and Advanced Practice Providers (APPs -  Physician Assistants and Nurse Practitioners) who all work together to provide you with the care you need, when you need it.  We recommend signing up for the patient portal called "MyChart".  Sign up information is provided on this After Visit Summary.  MyChart is used to connect with patients for Virtual Visits (Telemedicine).  Patients are able to view lab/test results, encounter notes, upcoming appointments, etc.  Non-urgent messages can be sent to your provider as well.   To learn more about what you can do with MyChart, go to [email or web address].    Your next appointment:   3 week(s)  Provider:   Wallis Bamberg, NP Rosalita Levan)     Other Instructions

## 2023-04-21 DIAGNOSIS — I129 Hypertensive chronic kidney disease with stage 1 through stage 4 chronic kidney disease, or unspecified chronic kidney disease: Secondary | ICD-10-CM | POA: Diagnosis not present

## 2023-04-21 DIAGNOSIS — E1122 Type 2 diabetes mellitus with diabetic chronic kidney disease: Secondary | ICD-10-CM | POA: Diagnosis not present

## 2023-04-21 DIAGNOSIS — N189 Chronic kidney disease, unspecified: Secondary | ICD-10-CM | POA: Diagnosis not present

## 2023-04-21 DIAGNOSIS — I5030 Unspecified diastolic (congestive) heart failure: Secondary | ICD-10-CM | POA: Diagnosis not present

## 2023-04-21 DIAGNOSIS — I5023 Acute on chronic systolic (congestive) heart failure: Secondary | ICD-10-CM | POA: Diagnosis not present

## 2023-04-21 DIAGNOSIS — J441 Chronic obstructive pulmonary disease with (acute) exacerbation: Secondary | ICD-10-CM | POA: Diagnosis not present

## 2023-04-23 DIAGNOSIS — I5023 Acute on chronic systolic (congestive) heart failure: Secondary | ICD-10-CM | POA: Diagnosis not present

## 2023-04-23 DIAGNOSIS — I129 Hypertensive chronic kidney disease with stage 1 through stage 4 chronic kidney disease, or unspecified chronic kidney disease: Secondary | ICD-10-CM | POA: Diagnosis not present

## 2023-04-23 DIAGNOSIS — I5030 Unspecified diastolic (congestive) heart failure: Secondary | ICD-10-CM | POA: Diagnosis not present

## 2023-04-23 DIAGNOSIS — E1122 Type 2 diabetes mellitus with diabetic chronic kidney disease: Secondary | ICD-10-CM | POA: Diagnosis not present

## 2023-04-23 DIAGNOSIS — N189 Chronic kidney disease, unspecified: Secondary | ICD-10-CM | POA: Diagnosis not present

## 2023-04-23 DIAGNOSIS — J441 Chronic obstructive pulmonary disease with (acute) exacerbation: Secondary | ICD-10-CM | POA: Diagnosis not present

## 2023-04-25 ENCOUNTER — Encounter: Payer: Self-pay | Admitting: Internal Medicine

## 2023-04-25 ENCOUNTER — Ambulatory Visit: Payer: Medicare Other | Admitting: Internal Medicine

## 2023-04-25 VITALS — BP 118/70 | HR 100 | Temp 97.9°F | Resp 18 | Ht 66.0 in | Wt 180.5 lb

## 2023-04-25 DIAGNOSIS — F411 Generalized anxiety disorder: Secondary | ICD-10-CM | POA: Diagnosis not present

## 2023-04-25 DIAGNOSIS — E785 Hyperlipidemia, unspecified: Secondary | ICD-10-CM | POA: Diagnosis not present

## 2023-04-25 DIAGNOSIS — I5023 Acute on chronic systolic (congestive) heart failure: Secondary | ICD-10-CM

## 2023-04-25 DIAGNOSIS — J9601 Acute respiratory failure with hypoxia: Secondary | ICD-10-CM

## 2023-04-25 DIAGNOSIS — I255 Ischemic cardiomyopathy: Secondary | ICD-10-CM

## 2023-04-25 DIAGNOSIS — E1169 Type 2 diabetes mellitus with other specified complication: Secondary | ICD-10-CM

## 2023-04-25 NOTE — Progress Notes (Unsigned)
   Office Visit  Subjective   Patient ID: Angela Lucero   DOB: 24-Dec-1940   Age: 82 y.o.   MRN: [id number]   Chief Complaint Chief Complaint  Patient presents with   Hospitalization Follow-up     History of Present Illness 82 years old female who is here hospital discharge for transition of care visit. She use oxygen all the time. She is getting [facility] for home PT, check on her.   Past Medical History Past Medical History:  Diagnosis Date   Acute hypoxic respiratory failure (HCC) 02/04/2023   Acute on chronic HFrEF (heart failure with reduced ejection fraction) (HCC) 02/05/2023   Acute systolic heart failure (HCC) 12/14/2021   Anxiety disorder, unspecified 03/16/2023   Arthritis 2010   Lower back and neck   CAD (coronary artery disease)    Cardiomyopathy (HCC) 01/01/2022   Chronic edema 12/18/2021   Bilateral lower extremities         Congestive heart failure (CHF) (HCC) 12/10/2021   COPD (chronic obstructive pulmonary disease) (HCC)    COPD exacerbation (HCC) 02/05/2023   Coronary artery disease PTCA and stenting in 1997 and 2005 in [place] 12/10/2021   CVA (cerebral vascular accident) (HCC)    1997, 2005 stents   Diabetes (HCC)    Diabetes mellitus, type II (HCC)    Gout 1998   Heart attack (HCC)    4166,0630   Hyperlipidemia    Hypertension    Hypertriglyceridemia    Muscle wasting and atrophy, not elsewhere classified, multiple sites 03/16/2023   Non-ST elevation (NSTEMI) myocardial infarction (HCC) 12/14/2021   Nonrheumatic mitral (valve) insufficiency 03/16/2023   Type 2 diabetes mellitus with complication, without long-term current use of insulin (HCC) 02/01/2018     Allergies No Known Allergies   Review of Systems Review of Systems  Constitutional: Negative.   HENT: Negative.    Respiratory:  Positive for shortness of breath.   Cardiovascular:  Positive for leg swelling.  Gastrointestinal: Negative.   Genitourinary: Negative.    Neurological: Negative.        Objective:    Vitals BP 118/70 (BP Location: Left Arm, Patient Position: Sitting, Cuff Size: Normal)   Pulse 100   Temp 97.9 F (36.6 C)   Resp 18   Ht 5\' 6"  (1.676 m)   Wt 180 lb 8 oz (81.9 kg)   SpO2 90%   BMI 29.13 kg/m    Physical Examination Physical Exam Constitutional:      Appearance: Normal appearance.  HENT:     Head: Normocephalic and atraumatic.  Cardiovascular:     Rate and Rhythm: Normal rate and regular rhythm.     Heart sounds: Normal heart sounds.  Pulmonary:     Effort: Pulmonary effort is normal.     Breath sounds: Normal breath sounds.  Abdominal:     General: Bowel sounds are normal.     Palpations: Abdomen is soft.  Musculoskeletal:     Right lower leg: Edema present.     Left lower leg: Edema present.  Neurological:     Mental Status: She is alert.        Assessment & Plan:   Dyslipidemia associated with type 2 diabetes mellitus (HCC) I will do HbA1c and lipid panel.    Return in about 2 weeks (around 05/09/2023).   [name], MD

## 2023-04-25 NOTE — Assessment & Plan Note (Signed)
I will do HbA1c and lipid panel.

## 2023-04-26 ENCOUNTER — Telehealth: Payer: Self-pay | Admitting: Internal Medicine

## 2023-04-26 DIAGNOSIS — N189 Chronic kidney disease, unspecified: Secondary | ICD-10-CM | POA: Diagnosis not present

## 2023-04-26 DIAGNOSIS — J441 Chronic obstructive pulmonary disease with (acute) exacerbation: Secondary | ICD-10-CM | POA: Diagnosis not present

## 2023-04-26 DIAGNOSIS — I5030 Unspecified diastolic (congestive) heart failure: Secondary | ICD-10-CM | POA: Diagnosis not present

## 2023-04-26 DIAGNOSIS — I129 Hypertensive chronic kidney disease with stage 1 through stage 4 chronic kidney disease, or unspecified chronic kidney disease: Secondary | ICD-10-CM | POA: Diagnosis not present

## 2023-04-26 DIAGNOSIS — E1122 Type 2 diabetes mellitus with diabetic chronic kidney disease: Secondary | ICD-10-CM | POA: Diagnosis not present

## 2023-04-26 DIAGNOSIS — I5023 Acute on chronic systolic (congestive) heart failure: Secondary | ICD-10-CM | POA: Diagnosis not present

## 2023-04-26 LAB — CMP14 + ANION GAP
ALT: 11 IU/L (ref 0–32)
AST: 20 IU/L (ref 0–40)
Albumin: 3.8 g/dL (ref 3.8–4.8)
Alkaline Phosphatase: 66 IU/L (ref 44–121)
Anion Gap: 15 mmol/L (ref 10.0–18.0)
BUN/Creatinine Ratio: 32 — ABNORMAL HIGH (ref 12–28)
BUN: 50 mg/dL — ABNORMAL HIGH (ref 8–27)
Bilirubin Total: 0.4 mg/dL (ref 0.0–1.2)
CO2: 27 mmol/L (ref 20–29)
Calcium: 9.5 mg/dL (ref 8.7–10.3)
Chloride: 95 mmol/L — ABNORMAL LOW (ref 96–106)
Creatinine, Ser: 1.55 mg/dL — ABNORMAL HIGH (ref 0.57–1.00)
Globulin, Total: 2.3 g/dL (ref 1.5–4.5)
Glucose: 94 mg/dL (ref 70–99)
Potassium: 4.7 mmol/L (ref 3.5–5.2)
Sodium: 137 mmol/L (ref 134–144)
Total Protein: 6.1 g/dL (ref 6.0–8.5)
eGFR: 34 mL/min/{1.73_m2} — ABNORMAL LOW (ref >59)

## 2023-04-26 LAB — HEMOGLOBIN A1C
Est. average glucose Bld gHb Est-mCnc: 143 mg/dL
Hgb A1c MFr Bld: 6.6 % — ABNORMAL HIGH (ref 4.8–5.6)

## 2023-04-26 MED ORDER — METOPROLOL SUCCINATE ER 25 MG PO TB24: 25.0000 mg | ORAL_TABLET | Freq: Every day | ORAL | 3 refills | Status: DC

## 2023-04-26 MED ORDER — POLYETHYLENE GLYCOL 3350 17 GM/SCOOP PO POWD: 17.0000 g | Freq: Every day | ORAL | 0 refills | Status: DC | PRN

## 2023-04-26 MED ORDER — SPIRONOLACTONE 50 MG PO TABS: 50.0000 mg | ORAL_TABLET | Freq: Every day | ORAL | 2 refills | Status: DC

## 2023-04-26 MED ORDER — EMPAGLIFLOZIN 25 MG PO TABS: 25.0000 mg | ORAL_TABLET | Freq: Every day | ORAL | 0 refills | Status: DC

## 2023-04-26 MED ORDER — ATORVASTATIN CALCIUM 80 MG PO TABS: 80.0000 mg | ORAL_TABLET | Freq: Every day | ORAL | 0 refills | Status: DC

## 2023-04-26 MED ORDER — ISOSORBIDE MONONITRATE ER 30 MG PO TB24: 90.0000 mg | ORAL_TABLET | Freq: Every day | ORAL | 0 refills | Status: DC

## 2023-04-26 MED ORDER — BUSPIRONE HCL 10 MG PO TABS: 10.0000 mg | ORAL_TABLET | Freq: Three times a day (TID) | ORAL | 2 refills | Status: DC

## 2023-04-26 MED ORDER — TORSEMIDE 20 MG PO TABS: 60.0000 mg | ORAL_TABLET | Freq: Two times a day (BID) | ORAL | 1 refills | Status: DC

## 2023-04-26 MED ORDER — ENTRESTO 24-26 MG PO TABS: 1.0000 | ORAL_TABLET | Freq: Two times a day (BID) | ORAL | 3 refills | Status: DC

## 2023-04-26 MED ORDER — PANTOPRAZOLE SODIUM 40 MG PO TBEC: 40.0000 mg | DELAYED_RELEASE_TABLET | Freq: Every day | ORAL | 2 refills | Status: AC

## 2023-04-26 MED ORDER — UMECLIDINIUM-VILANTEROL 62.5-25 MCG/ACT IN AEPB: 1.0000 | INHALATION_SPRAY | Freq: Every day | RESPIRATORY_TRACT | 0 refills | Status: AC

## 2023-04-26 MED ORDER — TRAMADOL HCL 50 MG PO TABS: 50.0000 mg | ORAL_TABLET | Freq: Four times a day (QID) | ORAL | 0 refills | Status: DC | PRN

## 2023-04-26 MED ORDER — ALBUTEROL SULFATE HFA 108 (90 BASE) MCG/ACT IN AERS: 2.0000 | INHALATION_SPRAY | Freq: Four times a day (QID) | RESPIRATORY_TRACT | 0 refills | Status: DC | PRN

## 2023-04-26 MED ORDER — CLONAZEPAM 0.5 MG PO TABS: 0.5000 mg | ORAL_TABLET | Freq: Two times a day (BID) | ORAL | 1 refills | Status: DC | PRN

## 2023-04-26 MED ORDER — TAMSULOSIN HCL 0.4 MG PO CAPS: 0.4000 mg | ORAL_CAPSULE | Freq: Every day | ORAL | 0 refills | Status: DC

## 2023-04-26 NOTE — Telephone Encounter (Signed)
I have spoken with her about her test results.

## 2023-04-26 NOTE — Telephone Encounter (Signed)
I have called her and spoken with her about her test results.

## 2023-04-27 DIAGNOSIS — I130 Hypertensive heart and chronic kidney disease with heart failure and stage 1 through stage 4 chronic kidney disease, or unspecified chronic kidney disease: Secondary | ICD-10-CM | POA: Diagnosis not present

## 2023-04-27 DIAGNOSIS — I051 Rheumatic mitral insufficiency: Secondary | ICD-10-CM | POA: Diagnosis not present

## 2023-04-27 DIAGNOSIS — D692 Other nonthrombocytopenic purpura: Secondary | ICD-10-CM | POA: Diagnosis not present

## 2023-04-27 DIAGNOSIS — I2510 Atherosclerotic heart disease of native coronary artery without angina pectoris: Secondary | ICD-10-CM | POA: Diagnosis not present

## 2023-04-27 DIAGNOSIS — Z9981 Dependence on supplemental oxygen: Secondary | ICD-10-CM | POA: Diagnosis not present

## 2023-04-27 DIAGNOSIS — I471 Supraventricular tachycardia, unspecified: Secondary | ICD-10-CM | POA: Diagnosis not present

## 2023-04-27 DIAGNOSIS — D631 Anemia in chronic kidney disease: Secondary | ICD-10-CM | POA: Diagnosis not present

## 2023-04-27 DIAGNOSIS — I252 Old myocardial infarction: Secondary | ICD-10-CM | POA: Diagnosis not present

## 2023-04-27 DIAGNOSIS — E1122 Type 2 diabetes mellitus with diabetic chronic kidney disease: Secondary | ICD-10-CM | POA: Diagnosis not present

## 2023-04-27 DIAGNOSIS — I5030 Unspecified diastolic (congestive) heart failure: Secondary | ICD-10-CM | POA: Diagnosis not present

## 2023-04-27 DIAGNOSIS — J449 Chronic obstructive pulmonary disease, unspecified: Secondary | ICD-10-CM | POA: Diagnosis not present

## 2023-04-27 DIAGNOSIS — Z556 Problems related to health literacy: Secondary | ICD-10-CM | POA: Diagnosis not present

## 2023-04-27 DIAGNOSIS — E785 Hyperlipidemia, unspecified: Secondary | ICD-10-CM | POA: Diagnosis not present

## 2023-04-27 DIAGNOSIS — I5023 Acute on chronic systolic (congestive) heart failure: Secondary | ICD-10-CM | POA: Diagnosis not present

## 2023-04-27 DIAGNOSIS — F17200 Nicotine dependence, unspecified, uncomplicated: Secondary | ICD-10-CM | POA: Diagnosis not present

## 2023-04-27 DIAGNOSIS — N183 Chronic kidney disease, stage 3 unspecified: Secondary | ICD-10-CM | POA: Diagnosis not present

## 2023-04-27 DIAGNOSIS — F419 Anxiety disorder, unspecified: Secondary | ICD-10-CM | POA: Diagnosis not present

## 2023-04-27 DIAGNOSIS — Z791 Long term (current) use of non-steroidal anti-inflammatories (NSAID): Secondary | ICD-10-CM | POA: Diagnosis not present

## 2023-04-28 ENCOUNTER — Other Ambulatory Visit: Payer: Self-pay

## 2023-04-28 DIAGNOSIS — D631 Anemia in chronic kidney disease: Secondary | ICD-10-CM | POA: Diagnosis not present

## 2023-04-28 DIAGNOSIS — E1122 Type 2 diabetes mellitus with diabetic chronic kidney disease: Secondary | ICD-10-CM | POA: Diagnosis not present

## 2023-04-28 DIAGNOSIS — N183 Chronic kidney disease, stage 3 unspecified: Secondary | ICD-10-CM | POA: Diagnosis not present

## 2023-04-28 DIAGNOSIS — I5030 Unspecified diastolic (congestive) heart failure: Secondary | ICD-10-CM | POA: Diagnosis not present

## 2023-04-28 DIAGNOSIS — I5023 Acute on chronic systolic (congestive) heart failure: Secondary | ICD-10-CM | POA: Diagnosis not present

## 2023-04-28 DIAGNOSIS — I130 Hypertensive heart and chronic kidney disease with heart failure and stage 1 through stage 4 chronic kidney disease, or unspecified chronic kidney disease: Secondary | ICD-10-CM | POA: Diagnosis not present

## 2023-04-28 MED ORDER — ENTRESTO 24-26 MG PO TABS: 1.0000 | ORAL_TABLET | Freq: Two times a day (BID) | ORAL | 3 refills | Status: DC

## 2023-04-28 MED ORDER — MAGNESIUM OXIDE -MG SUPPLEMENT 400 (240 MG) MG PO TABS: 1.0000 | ORAL_TABLET | Freq: Every day | ORAL | 1 refills | Status: AC

## 2023-04-28 NOTE — Assessment & Plan Note (Signed)
She take clonazepam as needed basis

## 2023-04-28 NOTE — Assessment & Plan Note (Signed)
She use oxygen 2 L all the time

## 2023-04-28 NOTE — Assessment & Plan Note (Signed)
She has gained 5 pounds within last 5 days, I will increase spironolactone to 50 mg daily and she will decrease fluid intake as well. She has appointment with cardiologist next week and labs drawn on Friday.

## 2023-04-28 NOTE — Assessment & Plan Note (Signed)
Her EF was 20-25%

## 2023-04-30 ENCOUNTER — Encounter (HOSPITAL_COMMUNITY): Payer: Self-pay

## 2023-04-30 DIAGNOSIS — I509 Heart failure, unspecified: Secondary | ICD-10-CM | POA: Diagnosis not present

## 2023-04-30 DIAGNOSIS — I5189 Other ill-defined heart diseases: Secondary | ICD-10-CM | POA: Diagnosis not present

## 2023-04-30 DIAGNOSIS — M25461 Effusion, right knee: Secondary | ICD-10-CM | POA: Diagnosis not present

## 2023-04-30 DIAGNOSIS — R918 Other nonspecific abnormal finding of lung field: Secondary | ICD-10-CM | POA: Diagnosis not present

## 2023-04-30 DIAGNOSIS — I252 Old myocardial infarction: Secondary | ICD-10-CM | POA: Diagnosis not present

## 2023-04-30 DIAGNOSIS — E1122 Type 2 diabetes mellitus with diabetic chronic kidney disease: Secondary | ICD-10-CM | POA: Diagnosis not present

## 2023-04-30 DIAGNOSIS — E785 Hyperlipidemia, unspecified: Secondary | ICD-10-CM | POA: Diagnosis not present

## 2023-04-30 DIAGNOSIS — Z955 Presence of coronary angioplasty implant and graft: Secondary | ICD-10-CM | POA: Diagnosis not present

## 2023-04-30 DIAGNOSIS — W19XXXA Unspecified fall, initial encounter: Secondary | ICD-10-CM | POA: Diagnosis not present

## 2023-04-30 DIAGNOSIS — I517 Cardiomegaly: Secondary | ICD-10-CM | POA: Diagnosis not present

## 2023-04-30 DIAGNOSIS — M109 Gout, unspecified: Secondary | ICD-10-CM | POA: Diagnosis not present

## 2023-04-30 DIAGNOSIS — Z66 Do not resuscitate: Secondary | ICD-10-CM | POA: Diagnosis not present

## 2023-04-30 DIAGNOSIS — I700 Atherosclerosis of aorta: Secondary | ICD-10-CM | POA: Diagnosis not present

## 2023-04-30 DIAGNOSIS — Z8679 Personal history of other diseases of the circulatory system: Secondary | ICD-10-CM | POA: Diagnosis not present

## 2023-04-30 DIAGNOSIS — M47816 Spondylosis without myelopathy or radiculopathy, lumbar region: Secondary | ICD-10-CM | POA: Diagnosis not present

## 2023-04-30 DIAGNOSIS — I214 Non-ST elevation (NSTEMI) myocardial infarction: Secondary | ICD-10-CM | POA: Diagnosis not present

## 2023-04-30 DIAGNOSIS — I429 Cardiomyopathy, unspecified: Secondary | ICD-10-CM | POA: Diagnosis not present

## 2023-04-30 DIAGNOSIS — R0600 Dyspnea, unspecified: Secondary | ICD-10-CM | POA: Diagnosis not present

## 2023-04-30 DIAGNOSIS — J9621 Acute and chronic respiratory failure with hypoxia: Secondary | ICD-10-CM | POA: Diagnosis not present

## 2023-04-30 DIAGNOSIS — Z87440 Personal history of urinary (tract) infections: Secondary | ICD-10-CM | POA: Diagnosis not present

## 2023-04-30 DIAGNOSIS — I2510 Atherosclerotic heart disease of native coronary artery without angina pectoris: Secondary | ICD-10-CM | POA: Diagnosis not present

## 2023-04-30 DIAGNOSIS — S79911A Unspecified injury of right hip, initial encounter: Secondary | ICD-10-CM | POA: Diagnosis not present

## 2023-04-30 DIAGNOSIS — Y9301 Activity, walking, marching and hiking: Secondary | ICD-10-CM | POA: Diagnosis present

## 2023-04-30 DIAGNOSIS — J9611 Chronic respiratory failure with hypoxia: Secondary | ICD-10-CM | POA: Diagnosis not present

## 2023-04-30 DIAGNOSIS — Z79899 Other long term (current) drug therapy: Secondary | ICD-10-CM | POA: Diagnosis not present

## 2023-04-30 DIAGNOSIS — Z888 Allergy status to other drugs, medicaments and biological substances status: Secondary | ICD-10-CM | POA: Diagnosis not present

## 2023-04-30 DIAGNOSIS — I420 Dilated cardiomyopathy: Secondary | ICD-10-CM | POA: Diagnosis not present

## 2023-04-30 DIAGNOSIS — I5021 Acute systolic (congestive) heart failure: Secondary | ICD-10-CM | POA: Diagnosis not present

## 2023-04-30 DIAGNOSIS — N183 Chronic kidney disease, stage 3 unspecified: Secondary | ICD-10-CM | POA: Diagnosis not present

## 2023-04-30 DIAGNOSIS — R54 Age-related physical debility: Secondary | ICD-10-CM | POA: Diagnosis not present

## 2023-04-30 DIAGNOSIS — Z01810 Encounter for preprocedural cardiovascular examination: Secondary | ICD-10-CM | POA: Diagnosis not present

## 2023-04-30 DIAGNOSIS — J449 Chronic obstructive pulmonary disease, unspecified: Secondary | ICD-10-CM | POA: Diagnosis not present

## 2023-04-30 DIAGNOSIS — J9610 Chronic respiratory failure, unspecified whether with hypoxia or hypercapnia: Secondary | ICD-10-CM | POA: Diagnosis not present

## 2023-04-30 DIAGNOSIS — Z7982 Long term (current) use of aspirin: Secondary | ICD-10-CM | POA: Diagnosis not present

## 2023-04-30 DIAGNOSIS — S72001A Fracture of unspecified part of neck of right femur, initial encounter for closed fracture: Secondary | ICD-10-CM | POA: Diagnosis not present

## 2023-04-30 DIAGNOSIS — Z87891 Personal history of nicotine dependence: Secondary | ICD-10-CM | POA: Diagnosis not present

## 2023-04-30 DIAGNOSIS — M25551 Pain in right hip: Secondary | ICD-10-CM | POA: Diagnosis not present

## 2023-04-30 DIAGNOSIS — I130 Hypertensive heart and chronic kidney disease with heart failure and stage 1 through stage 4 chronic kidney disease, or unspecified chronic kidney disease: Secondary | ICD-10-CM | POA: Diagnosis not present

## 2023-04-30 DIAGNOSIS — E781 Pure hyperglyceridemia: Secondary | ICD-10-CM | POA: Diagnosis not present

## 2023-04-30 DIAGNOSIS — M1990 Unspecified osteoarthritis, unspecified site: Secondary | ICD-10-CM | POA: Diagnosis not present

## 2023-04-30 DIAGNOSIS — N1832 Chronic kidney disease, stage 3b: Secondary | ICD-10-CM | POA: Diagnosis not present

## 2023-04-30 DIAGNOSIS — Z8673 Personal history of transient ischemic attack (TIA), and cerebral infarction without residual deficits: Secondary | ICD-10-CM | POA: Diagnosis not present

## 2023-04-30 DIAGNOSIS — I340 Nonrheumatic mitral (valve) insufficiency: Secondary | ICD-10-CM | POA: Diagnosis not present

## 2023-04-30 DIAGNOSIS — F419 Anxiety disorder, unspecified: Secondary | ICD-10-CM | POA: Diagnosis not present

## 2023-04-30 DIAGNOSIS — R0989 Other specified symptoms and signs involving the circulatory and respiratory systems: Secondary | ICD-10-CM | POA: Diagnosis not present

## 2023-04-30 DIAGNOSIS — Z86711 Personal history of pulmonary embolism: Secondary | ICD-10-CM | POA: Diagnosis not present

## 2023-04-30 DIAGNOSIS — R262 Difficulty in walking, not elsewhere classified: Secondary | ICD-10-CM | POA: Diagnosis not present

## 2023-04-30 DIAGNOSIS — Y92029 Unspecified place in mobile home as the place of occurrence of the external cause: Secondary | ICD-10-CM | POA: Diagnosis not present

## 2023-04-30 DIAGNOSIS — I361 Nonrheumatic tricuspid (valve) insufficiency: Secondary | ICD-10-CM | POA: Diagnosis not present

## 2023-04-30 DIAGNOSIS — D72829 Elevated white blood cell count, unspecified: Secondary | ICD-10-CM | POA: Diagnosis not present

## 2023-04-30 DIAGNOSIS — S72041A Displaced fracture of base of neck of right femur, initial encounter for closed fracture: Secondary | ICD-10-CM | POA: Diagnosis not present

## 2023-04-30 DIAGNOSIS — I5022 Chronic systolic (congestive) heart failure: Secondary | ICD-10-CM | POA: Diagnosis not present

## 2023-04-30 DIAGNOSIS — I5020 Unspecified systolic (congestive) heart failure: Secondary | ICD-10-CM | POA: Diagnosis not present

## 2023-04-30 DIAGNOSIS — M1711 Unilateral primary osteoarthritis, right knee: Secondary | ICD-10-CM | POA: Diagnosis not present

## 2023-04-30 DIAGNOSIS — F411 Generalized anxiety disorder: Secondary | ICD-10-CM | POA: Diagnosis not present

## 2023-04-30 DIAGNOSIS — Z7984 Long term (current) use of oral hypoglycemic drugs: Secondary | ICD-10-CM | POA: Diagnosis not present

## 2023-04-30 DIAGNOSIS — Z9981 Dependence on supplemental oxygen: Secondary | ICD-10-CM | POA: Diagnosis not present

## 2023-04-30 DIAGNOSIS — I272 Pulmonary hypertension, unspecified: Secondary | ICD-10-CM | POA: Diagnosis not present

## 2023-04-30 DIAGNOSIS — I5023 Acute on chronic systolic (congestive) heart failure: Secondary | ICD-10-CM | POA: Diagnosis not present

## 2023-04-30 DIAGNOSIS — W010XXA Fall on same level from slipping, tripping and stumbling without subsequent striking against object, initial encounter: Secondary | ICD-10-CM | POA: Diagnosis present

## 2023-05-01 ENCOUNTER — Inpatient Hospital Stay (HOSPITAL_COMMUNITY): Payer: Medicare Other

## 2023-05-01 ENCOUNTER — Inpatient Hospital Stay (HOSPITAL_COMMUNITY)
Admission: RE | Admit: 2023-05-01 | Discharge: 2023-05-09 | DRG: 521 | Disposition: A | Discharge status: Skilled Nursing Facility | Payer: Medicare Other | Attending: Internal Medicine | Admitting: Internal Medicine

## 2023-05-01 ENCOUNTER — Encounter (HOSPITAL_COMMUNITY): Payer: Self-pay | Admitting: Orthopedic Surgery

## 2023-05-01 DIAGNOSIS — I420 Dilated cardiomyopathy: Secondary | ICD-10-CM | POA: Diagnosis not present

## 2023-05-01 DIAGNOSIS — Z955 Presence of coronary angioplasty implant and graft: Secondary | ICD-10-CM

## 2023-05-01 DIAGNOSIS — R54 Age-related physical debility: Secondary | ICD-10-CM | POA: Diagnosis present

## 2023-05-01 DIAGNOSIS — I340 Nonrheumatic mitral (valve) insufficiency: Secondary | ICD-10-CM | POA: Diagnosis not present

## 2023-05-01 DIAGNOSIS — J9611 Chronic respiratory failure with hypoxia: Secondary | ICD-10-CM | POA: Diagnosis not present

## 2023-05-01 DIAGNOSIS — R0602 Shortness of breath: Secondary | ICD-10-CM | POA: Diagnosis not present

## 2023-05-01 DIAGNOSIS — Z4789 Encounter for other orthopedic aftercare: Secondary | ICD-10-CM | POA: Diagnosis not present

## 2023-05-01 DIAGNOSIS — R262 Difficulty in walking, not elsewhere classified: Secondary | ICD-10-CM | POA: Diagnosis not present

## 2023-05-01 DIAGNOSIS — J441 Chronic obstructive pulmonary disease with (acute) exacerbation: Secondary | ICD-10-CM | POA: Diagnosis not present

## 2023-05-01 DIAGNOSIS — S72041A Displaced fracture of base of neck of right femur, initial encounter for closed fracture: Secondary | ICD-10-CM | POA: Diagnosis not present

## 2023-05-01 DIAGNOSIS — M47816 Spondylosis without myelopathy or radiculopathy, lumbar region: Secondary | ICD-10-CM | POA: Diagnosis present

## 2023-05-01 DIAGNOSIS — Z888 Allergy status to other drugs, medicaments and biological substances status: Secondary | ICD-10-CM

## 2023-05-01 DIAGNOSIS — Z90711 Acquired absence of uterus with remaining cervical stump: Secondary | ICD-10-CM

## 2023-05-01 DIAGNOSIS — Z8673 Personal history of transient ischemic attack (TIA), and cerebral infarction without residual deficits: Secondary | ICD-10-CM | POA: Diagnosis not present

## 2023-05-01 DIAGNOSIS — I252 Old myocardial infarction: Secondary | ICD-10-CM

## 2023-05-01 DIAGNOSIS — F17210 Nicotine dependence, cigarettes, uncomplicated: Secondary | ICD-10-CM | POA: Diagnosis not present

## 2023-05-01 DIAGNOSIS — S72001A Fracture of unspecified part of neck of right femur, initial encounter for closed fracture: Principal | ICD-10-CM | POA: Diagnosis present

## 2023-05-01 DIAGNOSIS — I272 Pulmonary hypertension, unspecified: Secondary | ICD-10-CM | POA: Diagnosis present

## 2023-05-01 DIAGNOSIS — Z01810 Encounter for preprocedural cardiovascular examination: Secondary | ICD-10-CM | POA: Diagnosis not present

## 2023-05-01 DIAGNOSIS — Z87891 Personal history of nicotine dependence: Secondary | ICD-10-CM

## 2023-05-01 DIAGNOSIS — J449 Chronic obstructive pulmonary disease, unspecified: Secondary | ICD-10-CM | POA: Diagnosis present

## 2023-05-01 DIAGNOSIS — Z8249 Family history of ischemic heart disease and other diseases of the circulatory system: Secondary | ICD-10-CM

## 2023-05-01 DIAGNOSIS — I130 Hypertensive heart and chronic kidney disease with heart failure and stage 1 through stage 4 chronic kidney disease, or unspecified chronic kidney disease: Secondary | ICD-10-CM | POA: Diagnosis present

## 2023-05-01 DIAGNOSIS — I5020 Unspecified systolic (congestive) heart failure: Secondary | ICD-10-CM | POA: Diagnosis not present

## 2023-05-01 DIAGNOSIS — R0902 Hypoxemia: Secondary | ICD-10-CM | POA: Diagnosis not present

## 2023-05-01 DIAGNOSIS — Y9301 Activity, walking, marching and hiking: Secondary | ICD-10-CM | POA: Diagnosis present

## 2023-05-01 DIAGNOSIS — E1122 Type 2 diabetes mellitus with diabetic chronic kidney disease: Secondary | ICD-10-CM | POA: Diagnosis present

## 2023-05-01 DIAGNOSIS — Z7982 Long term (current) use of aspirin: Secondary | ICD-10-CM

## 2023-05-01 DIAGNOSIS — Z8679 Personal history of other diseases of the circulatory system: Secondary | ICD-10-CM | POA: Diagnosis not present

## 2023-05-01 DIAGNOSIS — R918 Other nonspecific abnormal finding of lung field: Secondary | ICD-10-CM | POA: Diagnosis not present

## 2023-05-01 DIAGNOSIS — Z86711 Personal history of pulmonary embolism: Secondary | ICD-10-CM | POA: Diagnosis not present

## 2023-05-01 DIAGNOSIS — I509 Heart failure, unspecified: Secondary | ICD-10-CM

## 2023-05-01 DIAGNOSIS — Y92029 Unspecified place in mobile home as the place of occurrence of the external cause: Secondary | ICD-10-CM

## 2023-05-01 DIAGNOSIS — N1832 Chronic kidney disease, stage 3b: Secondary | ICD-10-CM | POA: Diagnosis present

## 2023-05-01 DIAGNOSIS — M25551 Pain in right hip: Secondary | ICD-10-CM | POA: Diagnosis not present

## 2023-05-01 DIAGNOSIS — F419 Anxiety disorder, unspecified: Secondary | ICD-10-CM | POA: Diagnosis not present

## 2023-05-01 DIAGNOSIS — I5022 Chronic systolic (congestive) heart failure: Secondary | ICD-10-CM | POA: Diagnosis not present

## 2023-05-01 DIAGNOSIS — I2510 Atherosclerotic heart disease of native coronary artery without angina pectoris: Secondary | ICD-10-CM | POA: Diagnosis present

## 2023-05-01 DIAGNOSIS — Z79899 Other long term (current) drug therapy: Secondary | ICD-10-CM

## 2023-05-01 DIAGNOSIS — D72829 Elevated white blood cell count, unspecified: Secondary | ICD-10-CM | POA: Diagnosis not present

## 2023-05-01 DIAGNOSIS — F411 Generalized anxiety disorder: Secondary | ICD-10-CM | POA: Diagnosis present

## 2023-05-01 DIAGNOSIS — M109 Gout, unspecified: Secondary | ICD-10-CM | POA: Diagnosis present

## 2023-05-01 DIAGNOSIS — R0989 Other specified symptoms and signs involving the circulatory and respiratory systems: Secondary | ICD-10-CM | POA: Diagnosis not present

## 2023-05-01 DIAGNOSIS — I5023 Acute on chronic systolic (congestive) heart failure: Secondary | ICD-10-CM | POA: Diagnosis present

## 2023-05-01 DIAGNOSIS — E781 Pure hyperglyceridemia: Secondary | ICD-10-CM | POA: Diagnosis present

## 2023-05-01 DIAGNOSIS — Z66 Do not resuscitate: Secondary | ICD-10-CM | POA: Diagnosis present

## 2023-05-01 DIAGNOSIS — Z7984 Long term (current) use of oral hypoglycemic drugs: Secondary | ICD-10-CM

## 2023-05-01 DIAGNOSIS — E785 Hyperlipidemia, unspecified: Secondary | ICD-10-CM | POA: Diagnosis not present

## 2023-05-01 DIAGNOSIS — Z7951 Long term (current) use of inhaled steroids: Secondary | ICD-10-CM

## 2023-05-01 DIAGNOSIS — I700 Atherosclerosis of aorta: Secondary | ICD-10-CM | POA: Diagnosis not present

## 2023-05-01 DIAGNOSIS — Z96641 Presence of right artificial hip joint: Secondary | ICD-10-CM | POA: Diagnosis not present

## 2023-05-01 DIAGNOSIS — I214 Non-ST elevation (NSTEMI) myocardial infarction: Secondary | ICD-10-CM | POA: Diagnosis present

## 2023-05-01 DIAGNOSIS — Z7401 Bed confinement status: Secondary | ICD-10-CM | POA: Diagnosis not present

## 2023-05-01 DIAGNOSIS — I5021 Acute systolic (congestive) heart failure: Secondary | ICD-10-CM | POA: Diagnosis not present

## 2023-05-01 DIAGNOSIS — W010XXA Fall on same level from slipping, tripping and stumbling without subsequent striking against object, initial encounter: Secondary | ICD-10-CM | POA: Diagnosis present

## 2023-05-01 DIAGNOSIS — Z9049 Acquired absence of other specified parts of digestive tract: Secondary | ICD-10-CM

## 2023-05-01 DIAGNOSIS — S72001D Fracture of unspecified part of neck of right femur, subsequent encounter for closed fracture with routine healing: Secondary | ICD-10-CM | POA: Diagnosis not present

## 2023-05-01 DIAGNOSIS — R296 Repeated falls: Secondary | ICD-10-CM | POA: Diagnosis not present

## 2023-05-01 DIAGNOSIS — R41841 Cognitive communication deficit: Secondary | ICD-10-CM | POA: Diagnosis not present

## 2023-05-01 DIAGNOSIS — R5381 Other malaise: Secondary | ICD-10-CM | POA: Diagnosis not present

## 2023-05-01 DIAGNOSIS — Z9981 Dependence on supplemental oxygen: Secondary | ICD-10-CM | POA: Diagnosis not present

## 2023-05-01 DIAGNOSIS — R1312 Dysphagia, oropharyngeal phase: Secondary | ICD-10-CM | POA: Diagnosis not present

## 2023-05-01 DIAGNOSIS — M6259 Muscle wasting and atrophy, not elsewhere classified, multiple sites: Secondary | ICD-10-CM | POA: Diagnosis not present

## 2023-05-01 DIAGNOSIS — N183 Chronic kidney disease, stage 3 unspecified: Secondary | ICD-10-CM | POA: Diagnosis not present

## 2023-05-01 DIAGNOSIS — Z471 Aftercare following joint replacement surgery: Secondary | ICD-10-CM | POA: Diagnosis not present

## 2023-05-01 DIAGNOSIS — N390 Urinary tract infection, site not specified: Secondary | ICD-10-CM | POA: Diagnosis not present

## 2023-05-01 DIAGNOSIS — J9621 Acute and chronic respiratory failure with hypoxia: Secondary | ICD-10-CM | POA: Diagnosis present

## 2023-05-01 HISTORY — DX: Chronic respiratory failure with hypoxia: J96.11

## 2023-05-01 HISTORY — DX: Chronic kidney disease, stage 3 unspecified: N18.30

## 2023-05-01 HISTORY — DX: Personal history of other diseases of the circulatory system: Z86.79

## 2023-05-01 LAB — COMPREHENSIVE METABOLIC PANEL WITH GFR
ALT: 18 U/L (ref 0–44)
AST: 24 U/L (ref 15–41)
Albumin: 2.9 g/dL — ABNORMAL LOW (ref 3.5–5.0)
Alkaline Phosphatase: 58 U/L (ref 38–126)
Anion gap: 10 (ref 5–15)
BUN: 49 mg/dL — ABNORMAL HIGH (ref 8–23)
CO2: 30 mmol/L (ref 22–32)
Calcium: 8.9 mg/dL (ref 8.9–10.3)
Chloride: 96 mmol/L — ABNORMAL LOW (ref 98–111)
Creatinine, Ser: 1.66 mg/dL — ABNORMAL HIGH (ref 0.44–1.00)
GFR, Estimated: 31 mL/min — ABNORMAL LOW
Glucose, Bld: 147 mg/dL — ABNORMAL HIGH (ref 70–99)
Potassium: 4.2 mmol/L (ref 3.5–5.1)
Sodium: 136 mmol/L (ref 135–145)
Total Bilirubin: 0.9 mg/dL (ref 0.3–1.2)
Total Protein: 6 g/dL — ABNORMAL LOW (ref 6.5–8.1)

## 2023-05-01 LAB — PROTIME-INR
INR: 1.1 (ref 0.8–1.2)
Prothrombin Time: 14.4 s (ref 11.4–15.2)

## 2023-05-01 LAB — ECHOCARDIOGRAM LIMITED
Calc EF: 23.4 %
S' Lateral: 6 cm
Single Plane A2C EF: 12.1 %
Single Plane A4C EF: 30.5 %
Weight: 2888.91 [oz_av]

## 2023-05-01 LAB — CBC WITH DIFFERENTIAL/PLATELET
Abs Immature Granulocytes: 0.03 10*3/uL (ref 0.00–0.07)
Basophils Absolute: 0 10*3/uL (ref 0.0–0.1)
Basophils Relative: 1 %
Eosinophils Absolute: 0.1 10*3/uL (ref 0.0–0.5)
Eosinophils Relative: 2 %
HCT: 35.9 % — ABNORMAL LOW (ref 36.0–46.0)
Hemoglobin: 11 g/dL — ABNORMAL LOW (ref 12.0–15.0)
Immature Granulocytes: 0 %
Lymphocytes Relative: 6 %
Lymphs Abs: 0.5 10*3/uL — ABNORMAL LOW (ref 0.7–4.0)
MCH: 28.4 pg (ref 26.0–34.0)
MCHC: 30.6 g/dL (ref 30.0–36.0)
MCV: 92.8 fL (ref 80.0–100.0)
Monocytes Absolute: 0.8 10*3/uL (ref 0.1–1.0)
Monocytes Relative: 9 %
Neutro Abs: 7.4 10*3/uL (ref 1.7–7.7)
Neutrophils Relative %: 82 %
Platelets: 174 10*3/uL (ref 150–400)
RBC: 3.87 MIL/uL (ref 3.87–5.11)
RDW: 18.2 % — ABNORMAL HIGH (ref 11.5–15.5)
WBC: 8.9 10*3/uL (ref 4.0–10.5)
nRBC: 0 % (ref 0.0–0.2)

## 2023-05-01 LAB — GLUCOSE, CAPILLARY
Glucose-Capillary: 127 mg/dL — ABNORMAL HIGH (ref 70–99)
Glucose-Capillary: 147 mg/dL — ABNORMAL HIGH (ref 70–99)
Glucose-Capillary: 160 mg/dL — ABNORMAL HIGH (ref 70–99)

## 2023-05-01 LAB — TSH: TSH: 6.532 u[IU]/mL — ABNORMAL HIGH (ref 0.350–4.500)

## 2023-05-01 LAB — VITAMIN B12: Vitamin B-12: 1012 pg/mL — ABNORMAL HIGH (ref 180–914)

## 2023-05-01 LAB — SURGICAL PCR SCREEN
MRSA, PCR: NEGATIVE
Staphylococcus aureus: POSITIVE — AB

## 2023-05-01 LAB — BRAIN NATRIURETIC PEPTIDE: B Natriuretic Peptide: 2534 pg/mL — ABNORMAL HIGH (ref 0.0–100.0)

## 2023-05-01 MED ORDER — MELATONIN 5 MG PO TABS
5.0000 mg | ORAL_TABLET | Freq: Every day | ORAL | Status: DC
  Administered 2023-05-01 – 2023-05-08 (×8): 5 mg via ORAL
  Filled 2023-05-01 (×8): qty 1

## 2023-05-01 MED ORDER — ONDANSETRON HCL 4 MG/2ML IJ SOLN: 4.0000 mg | Freq: Four times a day (QID) | INTRAMUSCULAR | Status: DC | PRN

## 2023-05-01 MED ORDER — METOPROLOL TARTRATE 5 MG/5ML IV SOLN: 5.0000 mg | INTRAVENOUS | Status: DC | PRN

## 2023-05-01 MED ORDER — ACETAMINOPHEN 650 MG RE SUPP: 650.0000 mg | Freq: Four times a day (QID) | RECTAL | Status: DC | PRN

## 2023-05-01 MED ORDER — CHLORHEXIDINE GLUCONATE CLOTH 2 % EX PADS
6.0000 | MEDICATED_PAD | Freq: Every day | CUTANEOUS | Status: DC
  Administered 2023-05-01 – 2023-05-03 (×3): 6 via TOPICAL

## 2023-05-01 MED ORDER — SODIUM CHLORIDE 0.9% FLUSH
3.0000 mL | Freq: Two times a day (BID) | INTRAVENOUS | Status: DC
  Administered 2023-05-01 – 2023-05-02 (×4): 3 mL via INTRAVENOUS

## 2023-05-01 MED ORDER — OXYCODONE-ACETAMINOPHEN 5-325 MG PO TABS
1.0000 | ORAL_TABLET | Freq: Four times a day (QID) | ORAL | Status: DC | PRN
  Administered 2023-05-01: 2 via ORAL
  Administered 2023-05-01 – 2023-05-02 (×5): 1 via ORAL
  Administered 2023-05-03: 2 via ORAL
  Filled 2023-05-01 (×2): qty 1
  Filled 2023-05-01: qty 2
  Filled 2023-05-01: qty 1
  Filled 2023-05-01: qty 2
  Filled 2023-05-01: qty 1
  Filled 2023-05-01: qty 2
  Filled 2023-05-01: qty 1
  Filled 2023-05-01: qty 2

## 2023-05-01 MED ORDER — SPIRONOLACTONE 25 MG PO TABS: 50.0000 mg | ORAL_TABLET | Freq: Every day | ORAL | Status: DC

## 2023-05-01 MED ORDER — ONDANSETRON HCL 4 MG PO TABS: 4.0000 mg | ORAL_TABLET | Freq: Four times a day (QID) | ORAL | Status: DC | PRN

## 2023-05-01 MED ORDER — SACUBITRIL-VALSARTAN 24-26 MG PO TABS: 1.0000 | ORAL_TABLET | Freq: Two times a day (BID) | ORAL | Status: DC

## 2023-05-01 MED ORDER — CLONAZEPAM 0.5 MG PO TABS
0.5000 mg | ORAL_TABLET | Freq: Two times a day (BID) | ORAL | Status: DC | PRN
  Administered 2023-05-02 – 2023-05-08 (×9): 0.5 mg via ORAL
  Filled 2023-05-01 (×9): qty 1

## 2023-05-01 MED ORDER — SODIUM CHLORIDE 0.9% FLUSH: 3.0000 mL | INTRAVENOUS | Status: DC | PRN

## 2023-05-01 MED ORDER — FUROSEMIDE 10 MG/ML IJ SOLN: 20.0000 mg | Freq: Two times a day (BID) | INTRAMUSCULAR | Status: DC

## 2023-05-01 MED ORDER — PANTOPRAZOLE SODIUM 40 MG PO TBEC
40.0000 mg | DELAYED_RELEASE_TABLET | Freq: Every day | ORAL | Status: DC
  Administered 2023-05-01 – 2023-05-09 (×9): 40 mg via ORAL
  Filled 2023-05-01 (×9): qty 1

## 2023-05-01 MED ORDER — ACETAMINOPHEN 325 MG PO TABS
650.0000 mg | ORAL_TABLET | Freq: Four times a day (QID) | ORAL | Status: DC | PRN
  Administered 2023-05-02: 650 mg via ORAL
  Filled 2023-05-01 (×2): qty 2

## 2023-05-01 MED ORDER — SENNOSIDES-DOCUSATE SODIUM 8.6-50 MG PO TABS: 1.0000 | ORAL_TABLET | Freq: Every evening | ORAL | Status: DC | PRN

## 2023-05-01 MED ORDER — HEPARIN SODIUM (PORCINE) 5000 UNIT/ML IJ SOLN
5000.0000 [IU] | Freq: Three times a day (TID) | INTRAMUSCULAR | Status: DC
  Administered 2023-05-01: 5000 [IU] via SUBCUTANEOUS
  Filled 2023-05-01: qty 1

## 2023-05-01 MED ORDER — DEXTROSE 50 % IV SOLN: 1.0000 | Freq: Every day | INTRAVENOUS | Status: DC | PRN

## 2023-05-01 MED ORDER — ALBUTEROL SULFATE (2.5 MG/3ML) 0.083% IN NEBU: 3.0000 mL | INHALATION_SOLUTION | Freq: Four times a day (QID) | RESPIRATORY_TRACT | Status: DC | PRN

## 2023-05-01 MED ORDER — ISOSORBIDE MONONITRATE ER 60 MG PO TB24: 90.0000 mg | ORAL_TABLET | Freq: Every day | ORAL | Status: DC

## 2023-05-01 MED ORDER — BUSPIRONE HCL 10 MG PO TABS
10.0000 mg | ORAL_TABLET | Freq: Three times a day (TID) | ORAL | Status: DC
  Administered 2023-05-01 – 2023-05-09 (×24): 10 mg via ORAL
  Filled 2023-05-01 (×24): qty 1

## 2023-05-01 MED ORDER — FUROSEMIDE 10 MG/ML IJ SOLN
40.0000 mg | Freq: Two times a day (BID) | INTRAMUSCULAR | Status: DC
  Administered 2023-05-01 (×2): 40 mg via INTRAVENOUS
  Filled 2023-05-01 (×3): qty 4

## 2023-05-01 MED ORDER — ATORVASTATIN CALCIUM 80 MG PO TABS
80.0000 mg | ORAL_TABLET | Freq: Every day | ORAL | Status: DC
  Administered 2023-05-01 – 2023-05-09 (×9): 80 mg via ORAL
  Filled 2023-05-01 (×9): qty 1

## 2023-05-01 MED ORDER — MUPIROCIN 2 % EX OINT
1.0000 | TOPICAL_OINTMENT | Freq: Two times a day (BID) | CUTANEOUS | Status: AC
  Administered 2023-05-01 – 2023-05-06 (×10): 1 via NASAL
  Filled 2023-05-01 (×4): qty 22

## 2023-05-01 MED ORDER — LEVALBUTEROL HCL 0.63 MG/3ML IN NEBU
0.6300 mg | INHALATION_SOLUTION | Freq: Four times a day (QID) | RESPIRATORY_TRACT | Status: DC | PRN
  Filled 2023-05-01: qty 3

## 2023-05-01 MED ORDER — PERFLUTREN LIPID MICROSPHERE
1.0000 mL | INTRAVENOUS | Status: AC | PRN
  Administered 2023-05-01: 2 mL via INTRAVENOUS
  Filled 2023-05-01: qty 10

## 2023-05-01 MED ORDER — SODIUM CHLORIDE 0.9 % IV SOLN: 250.0000 mL | INTRAVENOUS | Status: DC | PRN

## 2023-05-01 MED ORDER — METOPROLOL SUCCINATE ER 25 MG PO TB24
25.0000 mg | ORAL_TABLET | Freq: Every day | ORAL | Status: DC
  Filled 2023-05-01: qty 1

## 2023-05-01 MED ORDER — NITROGLYCERIN 0.4 MG SL SUBL: 0.4000 mg | SUBLINGUAL_TABLET | SUBLINGUAL | Status: DC | PRN

## 2023-05-01 MED ORDER — HYDRALAZINE HCL 20 MG/ML IJ SOLN: 10.0000 mg | INTRAMUSCULAR | Status: DC | PRN

## 2023-05-01 MED ORDER — UMECLIDINIUM-VILANTEROL 62.5-25 MCG/ACT IN AEPB
1.0000 | INHALATION_SPRAY | Freq: Every day | RESPIRATORY_TRACT | Status: DC
  Administered 2023-05-01 – 2023-05-09 (×8): 1 via RESPIRATORY_TRACT
  Filled 2023-05-01: qty 14

## 2023-05-01 MED ORDER — ASPIRIN 81 MG PO TBEC
81.0000 mg | DELAYED_RELEASE_TABLET | Freq: Every day | ORAL | Status: DC
  Administered 2023-05-01 – 2023-05-09 (×9): 81 mg via ORAL
  Filled 2023-05-01 (×9): qty 1

## 2023-05-01 MED ORDER — POLYETHYLENE GLYCOL 3350 17 G PO PACK: 17.0000 g | PACK | Freq: Every day | ORAL | Status: DC | PRN

## 2023-05-01 MED ORDER — MORPHINE SULFATE (PF) 2 MG/ML IV SOLN
1.0000 mg | INTRAVENOUS | Status: DC | PRN
  Administered 2023-05-01 – 2023-05-02 (×4): 1 mg via INTRAVENOUS
  Filled 2023-05-01 (×4): qty 1

## 2023-05-01 NOTE — H&P (Addendum)
History and Physical    Angela Lucero MWU:[id number] DOB: Sep 14, 1940 DOA: 05/01/2023  PCP: Angela Northern, MD   Patient coming from: Home   Chief Complaint:  Fall   HPI:  Angela Lucero is a 82 y.o. 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   Patient having sustained a fall while she was using her walker.  Patient was initially presented to emergency department at [facility] where she found to have right-sided hip fracture, acute exacerbation of CHF as well.  Patient has been recently admitted on 8/16 to 8/19 for a recent NSTMI and acute on chronic congestive heart failure.  Patient was treated with IV Lasix and discharged torsemide 60 mg twice daily.  Patient was doing well until she has a fall yesterday 04/30/2023.  Patient reported she has tripped over her walker and developed right-sided hip pain.  EMS was called and bring her to the ED and patient found to have a hip fracture. Patient denies any chest pain.  She is chronically short of breath and no significant recent change.  Patient reported her legs are swollen.  Denies any fever, chill and cough.  Patient is a medication include Jardiance 25 mg daily, Tylenol as needed, Protonix 40 mg twice daily, nitroglycerin as needed, Lopressor XL 25 mg daily, melatonin 5 mg daily, hydralazine 10 mg 3 times daily, Toradol as needed, albuterol nebulizer as needed, Ellipta 1 puff daily, nicotine 7 mg p.o. daily, mag oxide 40 mg daily, Imdur 60 mg daily, torsemide 60 mg p.o. twice daily, Klonopin 0.5 mg p.o. twice daily as needed, aspirin 81 mg daily, Lipitor 80 mg daily, buspirone 10 mg p.o. 3 times daily and Entresto once daily.  ED Course:  At initial presentation to ED respiratory rate 20 and O2 sat 90% on 4 L, oxygen, heart rate 104, blood  pressure 116/59.  Chest x-ray showing cardiomegaly with pulmonary vascular congestion.  Interstitial prominence bilaterally, possible edema or infiltrate.  X-ray of the right hip showed there is a displaced fracture of the right femoral neck on the right with the superior subluxation of the distal fracture fragment.  The remaining bony structures are intact.  No dislocation.  Degenerative changes of the lower lumbar spine.  Vascular calcification.  Patient has been evaluated by anesthesia and cardiology due to severity of the cardiac issues and they recommended transfer to higher level of care. Case has been discussed with orthopedic surgeon over here at [facility] and patient has been accepted by orthopedics.  Most recent lab check 8/31 troponin 0.15 CBC unremarkable WBC 8, hemoglobin 11, hematocrit 34 and platelet 164 CMP showing sodium 134, potassium 4.1, chloride 95, bicarb 29, BUN 56, creatinine 1.6 and blood glucose 161.  Patient has yellow form singed which states DO NOT RESUSCITATE.  Patient has been evaluated by orthopedic surgeon Dr. Stann Mainland while at the [facility] and orthopedic recommended right hip hemiarthroplasty as surgery will improve ambulation more quickly.  Case has been discussed with on-call orthopedics with Dr.Dumonski of [place] at [facility] and he has been accepted patient to transfer to [facility].   Review of Systems:  Review of Systems  Constitutional:  Negative for chills, fever and weight loss.  Respiratory:  Positive for cough and shortness of breath. Negative for hemoptysis and sputum production.  Cardiovascular:  Positive for orthopnea, leg swelling and PND. Negative for chest pain and palpitations.  Gastrointestinal:  Negative for abdominal pain, heartburn, nausea and vomiting.  Musculoskeletal:  Negative for neck pain.       Right-sided hip joint pain 10/10  Skin:  Negative for rash.  Neurological:  Negative for dizziness and  headaches.  Psychiatric/Behavioral:  The patient is not nervous/anxious.     Past Medical History:  Diagnosis Date   Acute hypoxic respiratory failure (HCC) 02/04/2023   Acute on chronic HFrEF (heart failure with reduced ejection fraction) (HCC) 02/05/2023   Acute systolic heart failure (HCC) 12/14/2021   Anxiety disorder, unspecified 03/16/2023   Arthritis 2010   Lower back and neck   CAD (coronary artery disease)    Cardiomyopathy (HCC) 01/01/2022   Chronic edema 12/18/2021   Bilateral lower extremities         Congestive heart failure (CHF) (HCC) 12/10/2021   COPD (chronic obstructive pulmonary disease) (HCC)    COPD exacerbation (HCC) 02/05/2023   Coronary artery disease PTCA and stenting in 1997 and 2005 in [place] 12/10/2021   CVA (cerebral vascular accident) (HCC)    1997, 2005 stents   Diabetes (HCC)    Diabetes mellitus, type II (HCC)    Gout 1998   Heart attack (HCC)    5638,7564   Hyperlipidemia    Hypertension    Hypertriglyceridemia    Muscle wasting and atrophy, not elsewhere classified, multiple sites 03/16/2023   Non-ST elevation (NSTEMI) myocardial infarction (HCC) 12/14/2021   Nonrheumatic mitral (valve) insufficiency 03/16/2023   Type 2 diabetes mellitus with complication, without long-term current use of insulin (HCC) 02/01/2018    Past Surgical History:  Procedure Laterality Date   APPENDECTOMY  1971   CHOLECYSTECTOMY     LEFT HEART CATH AND CORONARY ANGIOGRAPHY N/A 12/14/2021   Procedure: LEFT HEART CATH AND CORONARY ANGIOGRAPHY;  Surgeon: Yvonne Kendall, MD;  Location: [facility] INVASIVE CV LAB;  Service: Cardiovascular;  Laterality: N/A;   Navel hernia repair  2007   PARTIAL HYSTERECTOMY  1976   Stent Heart     TUBAL LIGATION  1974   Ulna nerve transpost L arm       reports that she quit smoking about 3 months ago. Her smoking use included cigarettes. She has never used smokeless tobacco. She reports that she does not currently use alcohol. She  reports that she does not use drugs.  No Known Allergies  Family History  Problem Relation Age of Onset   Heart disease Mother    Hypertension Mother    Heart disease Father    Hypertension Father    Cancer Brother     Prior to Admission medications   Medication Sig Start Date End Date Taking? Authorizing Provider  acetaminophen (TYLENOL) 500 MG tablet Take 500 mg by mouth every 6 (six) hours as needed for mild pain.    [provider]  albuterol (VENTOLIN HFA) 108 (90 Base) MCG/ACT inhaler Inhale 2 puffs into the lungs every 6 (six) hours as needed for wheezing or shortness of breath. 04/26/23   Angela Northern, MD  aspirin EC 81 MG tablet Take 81 mg by mouth daily. Swallow whole.    [provider]  atorvastatin (LIPITOR) 80 MG tablet Take 1 tablet (80 mg total) by mouth daily. 04/26/23   Angela Northern, MD  busPIRone (BUSPAR) 10 MG tablet Take 1 tablet (10 mg total) by mouth 3 (three) times daily. 04/26/23   Angela Northern, MD  Cetirizine-Pseudoephedrine (  ALLERGY RELIEF D PO) Take 1 tablet by mouth daily.    [provider]  Cholecalciferol (VITAMIN D3) 250 MCG (10000 UT) TABS Take 10,000 Units by mouth in the morning and at bedtime. 02/24/23   Angiulli, Mcarthur Rossetti, PA-C  clonazePAM (KLONOPIN) 0.5 MG tablet Take 1 tablet (0.5 mg total) by mouth 2 (two) times daily as needed for anxiety. 04/26/23   Angela Northern, MD  empagliflozin (JARDIANCE) 25 MG TABS tablet Take 1 tablet (25 mg total) by mouth daily. 04/26/23   Angela Northern, MD  ENTRESTO 24-26 MG Take 1 tablet by mouth 2 (two) times daily. 04/28/23   Angela Northern, MD  isosorbide mononitrate (IMDUR) 30 MG 24 hr tablet Take 3 tablets (90 mg total) by mouth daily. 04/26/23   Angela Northern, MD  magnesium oxide (MAG-OX) 400 (240 Mg) MG tablet Take 1 tablet (400 mg total) by mouth daily. 04/28/23   Angela Northern, MD  melatonin 5 MG TABS Take 1 tablet (5 mg total) by mouth at bedtime. 02/24/23   Angiulli, Mcarthur Rossetti, PA-C  metoprolol succinate  (TOPROL-XL) 25 MG 24 hr tablet Take 1 tablet (25 mg total) by mouth daily. 04/26/23   Angela Northern, MD  Multiple Vitamin (MULTIVITAMIN ADULT PO) Take 1 tablet by mouth daily.    [provider]  nicotine (NICODERM CQ - DOSED IN MG/24 HR) 7 mg/24hr patch Place 7 mg onto the skin daily. 03/22/23   [provider]  nitroGLYCERIN (NITROSTAT) 0.4 MG SL tablet Place 1 tablet (0.4 mg total) under the tongue every 5 (five) minutes as needed for chest pain. 02/24/23   Angiulli, Mcarthur Rossetti, PA-C  Omega 3 1000 MG CAPS Take 2 capsules by mouth in the morning and at bedtime.    [provider]  pantoprazole (PROTONIX) 40 MG tablet Take 1 tablet (40 mg total) by mouth daily. 04/26/23   Angela Northern, MD  polyethylene glycol powder (GLYCOLAX/MIRALAX) 17 GM/SCOOP powder Take 17 g by mouth daily as needed for mild constipation. 04/26/23   Angela Northern, MD  spironolactone (ALDACTONE) 50 MG tablet Take 1 tablet (50 mg total) by mouth daily. 04/26/23   Angela Northern, MD  tamsulosin (FLOMAX) 0.4 MG CAPS capsule Take 1 capsule (0.4 mg total) by mouth daily after supper. 04/26/23   Angela Northern, MD  torsemide (DEMADEX) 20 MG tablet Take 3 tablets (60 mg total) by mouth 2 (two) times daily. 04/26/23   Angela Northern, MD  traMADol (ULTRAM) 50 MG tablet Take 1 tablet (50 mg total) by mouth every 6 (six) hours as needed for moderate pain. 04/26/23   Angela Northern, MD  umeclidinium-vilanterol [facility] ELLIPTA) 62.5-25 MCG/ACT AEPB Inhale 1 puff into the lungs daily. 04/26/23   Angela Northern, MD     Physical Exam: Vitals:   05/01/23 0340  BP: 139/64  Pulse: (!) 114  Resp: (!) 22  Temp: 99.5 F (37.5 C)  SpO2: 95%    Physical Exam Constitutional:      General: She is not in acute distress.    Appearance: She is ill-appearing.  HENT:     Head: Normocephalic.     Mouth/Throat:     Mouth: Mucous membranes are moist.  Eyes:     Pupils: Pupils are equal, round, and reactive to light.  Cardiovascular:     Rate and Rhythm:  Normal rate and regular rhythm.     Pulses: Normal pulses.     Heart sounds: Normal heart sounds.  Pulmonary:     Effort:  Pulmonary effort is normal.     Breath sounds: Rales present. No wheezing or rhonchi.  Abdominal:     General: Bowel sounds are normal. There is no distension.     Tenderness: There is no abdominal tenderness.  Musculoskeletal:        General: Swelling present.     Cervical back: Neck supple.     Right lower leg: Edema present.     Left lower leg: Edema present.     Comments: Bilateral lower extremities edema 1+  Skin:    General: Skin is warm.     Capillary Refill: Capillary refill takes less than 2 seconds.  Neurological:     Mental Status: She is oriented to person, place, and time.  Psychiatric:        Mood and Affect: Mood normal.      Labs on Admission: I have personally reviewed following labs and imaging studies  CBC: No results for input(s): "WBC", "NEUTROABS", "HGB", "HCT", "MCV", "PLT" in the last 168 hours. Basic Metabolic Panel: Recent Labs  Lab 04/25/23 1620  NA 137  K 4.7  CL 95*  CO2 27  GLUCOSE 94  BUN 50*  CREATININE 1.55*  CALCIUM 9.5   GFR: Estimated Creatinine Clearance: 31.2 mL/min (A) (by C-G formula based on SCr of 1.55 mg/dL (H)). Liver Function Tests: Recent Labs  Lab 04/25/23 1620  AST 20  ALT 11  ALKPHOS 66  BILITOT 0.4  PROT 6.1  ALBUMIN 3.8   No results for input(s): "LIPASE", "AMYLASE" in the last 168 hours. No results for input(s): "AMMONIA" in the last 168 hours. Coagulation Profile: No results for input(s): "INR", "PROTIME" in the last 168 hours. Cardiac Enzymes: No results for input(s): "CKTOTAL", "CKMB", "CKMBINDEX", "TROPONINI", "TROPONINIHS" in the last 168 hours. BNP (last 3 results) Recent Labs    02/04/23 1152  BNP 3,951.4*   HbA1C: No results for input(s): "HGBA1C" in the last 72 hours. CBG: No results for input(s): "GLUCAP" in the last 168 hours. Lipid Profile: No results for  input(s): "CHOL", "HDL", "LDLCALC", "TRIG", "CHOLHDL", "LDLDIRECT" in the last 72 hours. Thyroid Function Tests: No results for input(s): "TSH", "T4TOTAL", "FREET4", "T3FREE", "THYROIDAB" in the last 72 hours. Anemia Panel: No results for input(s): "VITAMINB12", "FOLATE", "FERRITIN", "TIBC", "IRON", "RETICCTPCT" in the last 72 hours. Urine analysis:    Component Value Date/Time   COLORURINE YELLOW 02/16/2023 1805   APPEARANCEUR HAZY (A) 02/16/2023 1805   LABSPEC 1.012 02/16/2023 1805   PHURINE 5.0 02/16/2023 1805   GLUCOSEU >=500 (A) 02/16/2023 1805   HGBUR NEGATIVE 02/16/2023 1805   BILIRUBINUR NEGATIVE 02/16/2023 1805   KETONESUR NEGATIVE 02/16/2023 1805   PROTEINUR 30 (A) 02/16/2023 1805   NITRITE NEGATIVE 02/16/2023 1805   LEUKOCYTESUR SMALL (A) 02/16/2023 1805    Radiological Exams on Admission: I have personally reviewed images No results found.  Pending EKG.   Assessment/Plan: Principal Problem:   Closed right hip fracture, initial encounter [facility]) Active Problems:   Displaced fracture of right femoral neck (HCC)   Acute exacerbation of CHF (congestive heart failure) (HCC)   COPD (chronic obstructive pulmonary disease) (HCC)   GAD (generalized anxiety disorder)   Hyperlipidemia   History of CAD (coronary artery disease)   Chronic hypoxic respiratory failure (HCC)   Chronic kidney disease (CKD), stage III (moderate) (HCC)    Assessment and Plan: Right-sided femoral neck fracture -Patient sustained mechanical fall while using walker at home.  - X-ray of the right hip showed displaced fracture of the  right femoral neck on the right with superior subluxation of the distal fracture fragment. - EDP physician at [facility] case has been discussed with orthopedic surgeon with Dr. Yevette Edwards.  Orthopedic has been accepted the patient and patient has been transferred to [facility] for further evaluation. - Continue pain control with Percocet every 6 hours as needed for moderate  to severe pain and morphine 1 mg every 3 hours as needed for severe and breakthrough pain. - Holding PT and OT consult until surgery. - Keeping patient n.p.o. for surgery in a.m.   Mechanical fall - Mechanical fall due to loss of balance while using a walker. - Need to consult PT and OT after surgical intervention. -Continue fall precaution  Acute exacerbation of CHF  History of CHF with reduced EF less than 20 to 25% -Patient has shortness of breath that has been worsening with associated worsening bilateral lower extremity swelling and having some productive cough. - Concern for CHF exacerbation.  While patient at [facility] has been started treating with Lasix 40 mg IV twice daily.  Patient's most recent blood pressure is 139/64 and heart rate 114. - Chest x-ray showed pulmonary vascular congestion and cardiomegaly. - Per chart review most recent echocardiogram from 02/05/2023 showed EF 20 to 25%, left ventricular cavity mildly dilated, intermittent diastolic filling due to E-A effusion. -Concern for CHF exacerbation.  Checking BNP.  [facility] 40 mg IV once. - Due to borderline soft blood pressure continue with gentle IV diuretics with  Lasix 40 mg IV twice daily.  Assess blood pressure and volume status on daily basis and adjust dose of Lasix. -At home patient takes  Entresto 24-26 mg twice daily, spironolactone 50 mg daily, Jardiance 25 mg daily, Imdur 60 mg daily and Toprol-XL 25 mg daily.  Given patient blood pressure is borderline soft holding spironolactone and Imdur at this time. -Plan to continue Entresto 24-26 twice daily and Toprol-XL 25 mg daily.  If patient blood pressure can tolerate in that case will resume Imdur and spironolactone. -Obtaining echocardiogram. - Strict I's/O - Continue monitor urine output  Chronic hypoxic respiratory failure History of COPD -Chronic hypoxic respiratory failure on 4 L oxygen at baseline and  history of COPD. -Currently on 4 L oxygen maintaining O2 sat 95%.  Continue to check pulse ox.  And continue supplemental oxygen. -Currently stable from COPD perspective. - Continue Anoro Ellipta 1 puff daily  -Continue Xopenex as needed -Continue incentive spirometry.  CKD stage 3B Creatinine 1.55 and GFR 34.  Creatinine and GFR around patient baseline - Continue to monitor renal function and urine output.   History of CAD -Patient has recent episode of NSTMI in 2 weeks ago.  - Plan to continue aspirin 81 mg daily, Lipitor 80 mg daily, Toprol-XL 25 mg daily. -If patient blood pressure allows will resume Imdur. -Continue nitroglycerin as needed.  Generalized anxiety disorder -Continue BuSpar 10 mg 3 times daily and Klonopin 0.5 mg twice daily as needed.   Hyperlipidemia -Continue Lipitor  DM type II - As patient is currently n.p.o. holding Jardiance - Continue to check POC blood glucose 4 times daily. -Continue hypoglycemia protocol.  DVT prophylaxis:  SQ Heparin Code Status:  DNR/DNI(Do NOT Intubate).  Verified the Yellow DNR form  Diet: Currently n.p.o. Family Communication: Updated patient's Angela Lucero over phone. Disposition Plan: Orthopedic has been informed and will evaluate patient in the a.m. Consults: Orthopedic surgeon Admission status:   Inpatient, medical-telemetry  Severity  of Illness: The appropriate patient status for this patient is INPATIENT. Inpatient status is judged to be reasonable and necessary in order to provide the required intensity of service to ensure the patient's safety. The patient's presenting symptoms, physical exam findings, and initial radiographic and laboratory data in the context of their chronic comorbidities is felt to place them at high risk for further clinical deterioration. Furthermore, it is not anticipated that the patient will be medically stable for discharge from the hospital within 2 midnights of admission.   * I certify  that at the point of admission it is my clinical judgment that the patient will require inpatient hospital care spanning beyond 2 midnights from the point of admission due to high intensity of service, high risk for further deterioration and high frequency of surveillance required.Marland Kitchen    Tereasa Coop, MD [facility]  How to contact the [facility] Attending or Consulting provider 7A - 7P or covering provider during after hours 7P -7A, for this patient.  Check the care team in [facility] and look for a) attending/consulting [facility] provider listed and b) the [facility] team listed Log into [email or web address] and use [place]'s universal password to access. If you do not have the password, please contact the hospital operator. Locate the [facility] provider you are looking for under [facility] and page to a number that you can be directly reached. If you still have difficulty reaching the provider, please page the [facility] (Director on Call) for the Hospitalists listed on amion for assistance.  05/01/2023, 4:45 AM

## 2023-05-01 NOTE — Consult Note (Addendum)
Cardiology Consultation   Patient ID: Angela Lucero MRN: [id number]; DOB: 04-22-1941  Admit date: 05/01/2023 Date of Consult: 05/01/2023  PCP:  [name], MD   [facility] Providers Cardiologist:  Gypsy Balsam, MD     Patient Profile:   Angela Lucero is a 82 y.o. female with a hx of CAD s/p PTCA, COPD on home oxygen, HFrEF, history of CVA, type 2 DM, gout, HLD, HTN, CKD stage IIIb, GAD who is being seen 05/01/2023 for preoperative evaluation at the request of Dr. Nelson Chimes.  History of Present Illness:   Angela Lucero is an 82 year old female with above medical history who is followed by Dr. Bing Matter. Per chart review, patient has a long history of CAD and previously underwent PTCA in 1999 and PCI in 2005. These were completed in [place]. She established care with Dr. Bing Matter in 11/2021 for preoperative evaluation prior to knee surgery. Underwent echocardiogram in 11/2021 that was a poor study but estimated EF between 40-45%. Underwent LHC on 12/15/22 that showed severe single-vessel CAD with chronic total occlusion of the mid LAD with collaterals, patent distal RCA stent. Echo on 12/15/21 showed EF 30-35%, a fibrous structure that moved with tricuspid valve annulus, normal RV function, mild-moderate MR. Underwent cMRI on 12/16/21 that showed no right atrial mass, subendocardial LGE consistent with prior infarct in LAD territory, LVEF 24%, RVEF 52%.   Patient was recently admitted from 6/7-6/13/24 for acute hypoxic respiratory failure. Upon presentation to the ED, she was short of breath, had acute sharp chest pain. BNP was elevated to 3951. hsTn 147, 136. Echocardiogram 02/05/23 showed EF 20-25%, normal RV function with moderately elevated pulmonary artery systolic pressure, severe mitral regurgitation. Patient was diuresed and ultimately discharged to inpatient rehab. Eventually returned home after PT and OT in rehab  She was admitted to [facility] on 04/01/23 for CHF exacerbation. Pro BNP  elevated to 15,300. Troponin 0.14>3.42 consistent with NSTEMI. Patient as started on IV heparin and lasix drip. Underwent stress test on 04/05/23 that showed large fixed severe defect of the anterior apical wall with no definitive reversible changes. Echocardiogram revealed EF 20-25%, severe pulmonary HTN, severe mitral regurgitation. While admitted, patient had an episode of NSVT that was thought to be exacerbated by hypomagnesemia. There was recommendation for EP  referral after GDMT was optimized.   She was again admitted to [facility] on 04/15/23. There was some confusion around how patient had been taking her medications, and she had not been taking her diuretics. proBNP elevated to 12,700. She was treated with IV lasix. Last seen in the cardiology office on 04/19/23. At that appointment, patient continued to have significant pedal edema, shortness of breath, malaise/fatigue. She remained on jardiance 25 mg daily, entresto 24-26 mg BID, metoprolol succinate 25 mg daily. Started spironolactone 12.5 mg daily. Her BP was low so her hydralazine was stopped.   Patient presented to the ED at [facility] on 8/31 after she had a mechanical fall. Patient had been using a walker when she fell. She was found to have a right hip fracture as well as acute CHF exacerbation. CXR showed pulmonary vascular congestion and cardiomegaly. She was transferred to [facility] for further treatment. At [facility], BNP was elevated to 2,534. K 4.2, creatinine 1.66, hemoglobin 11.   Patient was seen by orthopedic surgery who are considering hip surgery. Cardiology consulted for CHF, preoperative evaluation.   On interview, patient reports that she is in quite a bit of pain from her right  hip. Reports that she has had a few difficult months due to her multiple hospitalizations. For the past few days, she had been doing OK from a cardiac standpoint. She was able to do light housework and move around her home without chest pain or  shortness of breath. However, on the day that she broke her hip, she did notice more shortness of breath than usual. Also noted orthopnea. Wears 2 L of oxygen when she is at rest, usually does not wear oxygen if she is moving around her home. Has been having more ankle swelling than usual over the past 3 weeks. Son is at bedside, and he believes that part of her shortness of breath is secondary to anxiety.    Past Medical History:  Diagnosis Date   Acute hypoxic respiratory failure (HCC) 02/04/2023   Acute on chronic HFrEF (heart failure with reduced ejection fraction) (HCC) 02/05/2023   Acute systolic heart failure (HCC) 12/14/2021   Anxiety disorder, unspecified 03/16/2023   Arthritis 2010   Lower back and neck   CAD (coronary artery disease)    Cardiomyopathy (HCC) 01/01/2022   Chronic edema 12/18/2021   Bilateral lower extremities         Congestive heart failure (CHF) (HCC) 12/10/2021   COPD (chronic obstructive pulmonary disease) (HCC)    COPD exacerbation (HCC) 02/05/2023   Coronary artery disease PTCA and stenting in 1997 and 2005 in [place] 12/10/2021   CVA (cerebral vascular accident) (HCC)    1997, 2005 stents   Diabetes (HCC)    Diabetes mellitus, type II (HCC)    Gout 1998   Heart attack (HCC)    [id number]   Hyperlipidemia    Hypertension    Hypertriglyceridemia    Muscle wasting and atrophy, not elsewhere classified, multiple sites 03/16/2023   Non-ST elevation (NSTEMI) myocardial infarction (HCC) 12/14/2021   Nonrheumatic mitral (valve) insufficiency 03/16/2023   Type 2 diabetes mellitus with complication, without long-term current use of insulin (HCC) 02/01/2018    Past Surgical History:  Procedure Laterality Date   APPENDECTOMY  1971   CHOLECYSTECTOMY     LEFT HEART CATH AND CORONARY ANGIOGRAPHY N/A 12/14/2021   Procedure: LEFT HEART CATH AND CORONARY ANGIOGRAPHY;  Surgeon: Yvonne Kendall, MD;  Location: [facility] INVASIVE CV LAB;  Service: Cardiovascular;   Laterality: N/A;   Navel hernia repair  2007   PARTIAL HYSTERECTOMY  1976   Stent Heart     TUBAL LIGATION  1974   Ulna nerve transpost L arm       Home Medications:  Prior to Admission medications   Medication Sig Start Date End Date Taking? Authorizing Provider  acetaminophen (TYLENOL) 500 MG tablet Take 500 mg by mouth every 6 (six) hours as needed for mild pain.    [provider]  albuterol (VENTOLIN HFA) 108 (90 Base) MCG/ACT inhaler Inhale 2 puffs into the lungs every 6 (six) hours as needed for wheezing or shortness of breath. 04/26/23   [name], MD  aspirin EC 81 MG tablet Take 81 mg by mouth daily. Swallow whole.    [provider]  atorvastatin (LIPITOR) 80 MG tablet Take 1 tablet (80 mg total) by mouth daily. 04/26/23   [name], MD  busPIRone (BUSPAR) 10 MG tablet Take 1 tablet (10 mg total) by mouth 3 (three) times daily. 04/26/23   [name], MD  Cetirizine-Pseudoephedrine (ALLERGY RELIEF D PO) Take 1 tablet by mouth daily.    [provider]  Cholecalciferol (VITAMIN D3) 250 MCG (  10000 UT) TABS Take 10,000 Units by mouth in the morning and at bedtime. 02/24/23   Angiulli, Mcarthur Rossetti, PA-C  clonazePAM (KLONOPIN) 0.5 MG tablet Take 1 tablet (0.5 mg total) by mouth 2 (two) times daily as needed for anxiety. 04/26/23   [name], MD  empagliflozin (JARDIANCE) 25 MG TABS tablet Take 1 tablet (25 mg total) by mouth daily. 04/26/23   [name], MD  ENTRESTO 24-26 MG Take 1 tablet by mouth 2 (two) times daily. 04/28/23   [name], MD  isosorbide mononitrate (IMDUR) 30 MG 24 hr tablet Take 3 tablets (90 mg total) by mouth daily. 04/26/23   [name], MD  magnesium oxide (MAG-OX) 400 (240 Mg) MG tablet Take 1 tablet (400 mg total) by mouth daily. 04/28/23   [name], MD  melatonin 5 MG TABS Take 1 tablet (5 mg total) by mouth at bedtime. 02/24/23   Angiulli, Mcarthur Rossetti, PA-C  metoprolol succinate (TOPROL-XL) 25 MG 24 hr tablet Take 1 tablet (25 mg total) by  mouth daily. 04/26/23   [name], MD  Multiple Vitamin (MULTIVITAMIN ADULT PO) Take 1 tablet by mouth daily.    [provider]  nicotine (NICODERM CQ - DOSED IN MG/24 HR) 7 mg/24hr patch Place 7 mg onto the skin daily. 03/22/23   [provider]  nitroGLYCERIN (NITROSTAT) 0.4 MG SL tablet Place 1 tablet (0.4 mg total) under the tongue every 5 (five) minutes as needed for chest pain. 02/24/23   Angiulli, Mcarthur Rossetti, PA-C  Omega 3 1000 MG CAPS Take 2 capsules by mouth in the morning and at bedtime.    [provider]  pantoprazole (PROTONIX) 40 MG tablet Take 1 tablet (40 mg total) by mouth daily. 04/26/23   [name], MD  polyethylene glycol powder (GLYCOLAX/MIRALAX) 17 GM/SCOOP powder Take 17 g by mouth daily as needed for mild constipation. 04/26/23   [name], MD  spironolactone (ALDACTONE) 50 MG tablet Take 1 tablet (50 mg total) by mouth daily. 04/26/23   [name], MD  tamsulosin (FLOMAX) 0.4 MG CAPS capsule Take 1 capsule (0.4 mg total) by mouth daily after supper. 04/26/23   [name], MD  torsemide (DEMADEX) 20 MG tablet Take 3 tablets (60 mg total) by mouth 2 (two) times daily. 04/26/23   [name], MD  traMADol (ULTRAM) 50 MG tablet Take 1 tablet (50 mg total) by mouth every 6 (six) hours as needed for moderate pain. 04/26/23   [name], MD  umeclidinium-vilanterol [facility] ELLIPTA) 62.5-25 MCG/ACT AEPB Inhale 1 puff into the lungs daily. 04/26/23   [name], MD    Inpatient Medications: Scheduled Meds:  aspirin EC  81 mg Oral Daily   atorvastatin  80 mg Oral Daily   busPIRone  10 mg Oral TID   furosemide  40 mg Intravenous BID   melatonin  5 mg Oral QHS   metoprolol succinate  25 mg Oral Daily   pantoprazole  40 mg Oral Daily   sacubitril-valsartan  1 tablet Oral BID   sodium chloride flush  3 mL Intravenous Q12H   umeclidinium-vilanterol  1 puff Inhalation Daily   Continuous Infusions:  sodium chloride     PRN Meds: sodium chloride, acetaminophen  **OR** acetaminophen, clonazePAM, dextrose, levalbuterol, morphine injection, nitroGLYCERIN, ondansetron **OR** ondansetron (ZOFRAN) IV, oxyCODONE-acetaminophen, polyethylene glycol, sodium chloride flush  Allergies:   No Known Allergies  Social History:   Social History   Socioeconomic History   Marital status: Widowed    Spouse  name: Not on file   Number of children: Not on file   Years of education: Not on file   Highest education level: Not on file  Occupational History   Not on file  Tobacco Use   Smoking status: Former    Current packs/day: 0.00    Types: Cigarettes    Quit date: 01/2023    Years since quitting: 0.2   Smokeless tobacco: Never  Vaping Use   Vaping status: Never Used  Substance and Sexual Activity   Alcohol use: Not Currently   Drug use: Never   Sexual activity: Not Currently  Other Topics Concern   Not on file  Social History Narrative   Not on file   Social Determinants of Health   Financial Resource Strain: Low Risk  (02/10/2023)   Overall Financial Resource Strain (CARDIA)    Difficulty of Paying Living Expenses: Not very hard  Food Insecurity: No Food Insecurity (02/04/2023)   Hunger Vital Sign    Worried About Running Out of Food in the Last Year: Never true    Ran Out of Food in the Last Year: Never true  Transportation Needs: No Transportation Needs (02/10/2023)   PRAPARE - Administrator, Civil Service (Medical): No    Lack of Transportation (Non-Medical): No  Physical Activity: Not on file  Stress: Not on file  Social Connections: Not on file  Intimate Partner Violence: Not on file    Family History:    Family History  Problem Relation Age of Onset   Heart disease Mother    Hypertension Mother    Heart disease Father    Hypertension Father    Cancer Brother      ROS:  Please see the history of present illness.   All other ROS reviewed and negative.     Physical Exam/Data:   Vitals:   05/01/23 0500 05/01/23  0611 05/01/23 0734 05/01/23 0828  BP:  (!) 88/45 (!) 92/58   Pulse:  (!) 111  (!) 101  Resp:   18   Temp:  98.4 F (36.9 C) 98.9 F (37.2 C)   TempSrc:  Oral Oral   SpO2:  97% 97% 98%  Weight: 81.9 kg      No intake or output data in the 24 hours ending 05/01/23 0939    05/01/2023    5:00 AM 04/25/2023    3:40 PM 04/19/2023    2:14 PM  Last 3 Weights  Weight (lbs) 180 lb 8.9 oz 180 lb 8 oz 173 lb  Weight (kg) 81.9 kg 81.874 kg 78.472 kg     Body mass index is 29.14 kg/m.  General:  Well nourished, well developed. Appears uncomfortable in the bed.  HEENT: normal Neck: no JVD Cardiac:  normal S1, S2; RRR; faint systolic murmur at left sternal border  Lungs:  crackles in bilateral lung bases. Normal work of breathing on 4 L via [place] Abd: soft, nontender, no hepatomegaly  Ext: 2+ edema in BLE Skin: warm and dry  Neuro:  CNs 2-12 intact, no focal abnormalities noted Psych:  Normal affect   EKG:  The EKG was personally reviewed and demonstrates:  EKG ordered  Telemetry:  Telemetry was personally reviewed and demonstrates:  NA   Relevant CV Studies:  Echocardiogram 02/05/23 showed EF 20-25%, normal RV function with moderately elevated pulmonary artery systolic pressure, severe mitral regurgitation   Stress test on 04/05/23 that showed large fixed severe defect of the anterior apical wall with no  definitive reversible changes.   Echocardiogram revealed EF 20-25%, severe pulmonary HTN, severe mitral regurgitation.   Laboratory Data:  High Sensitivity Troponin:  No results for input(s): "TROPONINIHS" in the last 720 hours.   Chemistry Recent Labs  Lab 04/25/23 1620 05/01/23 0636  NA 137 136  K 4.7 4.2  CL 95* 96*  CO2 27 30  GLUCOSE 94 147*  BUN 50* 49*  CREATININE 1.55* 1.66*  CALCIUM 9.5 8.9  GFRNONAA  --  31*  ANIONGAP  --  10    Recent Labs  Lab 04/25/23 1620 05/01/23 0636  PROT 6.1 6.0*  ALBUMIN 3.8 2.9*  AST 20 24  ALT 11 18  ALKPHOS 66 58  BILITOT 0.4 0.9    Lipids No results for input(s): "CHOL", "TRIG", "HDL", "LABVLDL", "LDLCALC", "CHOLHDL" in the last 168 hours.  Hematology Recent Labs  Lab 05/01/23 0636  WBC 8.9  RBC 3.87  HGB 11.0*  HCT 35.9*  MCV 92.8  MCH 28.4  MCHC 30.6  RDW 18.2*  PLT 174   Thyroid No results for input(s): "TSH", "FREET4" in the last 168 hours.  BNP Recent Labs  Lab 05/01/23 0636  BNP 2,534.0*    DDimer No results for input(s): "DDIMER" in the last 168 hours.   Radiology/Studies:  No results found.   Assessment and Plan:   Preoperative Evaluation  - Patient is an 82 year old female with a past medical history of systolic heart failure with EF 20-25% on echo from 03/2023, severe pulmonary HTN, severe mitral regurgitation, CAD, COPD on home oxygen. Currently admitted and pending ORIF for right hip fracture - Patient had an NSTEMI on 04/01/23 at [facility]- underwent nuclear stress test that showed a large fixed severe defect of the anterior apical wall and no definitive reversible changes.  - Patient denies recent chest pain. Does have some shortness of breath, orthopnea, and ankle edema. BNP this admission elevated to 2534. CXR with pulmonary vascular congestion (at [place]). She is currently requiring 4 L oxygen via  when her baseline is 2  - On IV lasix - continue to diurese so she is optimized prior to surgery  - Overall, patient is a high risk surgical candidate given her medical history. However, risk would not be reduced by additional cardiac studies. No further cardiac workup needed prior to surgery  -  Likely that she is a prohibitive risk for general anesthesia and would recommend using regional anesthesia if possible  - Final recommendations per Dr. Jacques Navy   Acute on chronic systolic heart failure  Severe MR  Severe pulmonary HTN  - Most recent echocardiogram from 03/2023 showed EF 20-25%, severe pulmonary HTN, severe MR  - Continues to complain of orthopnea, ankle edema. BNP  elevated to 2534. CXR with pulmonary vascular congestion  - Repeat echo pending this admission  - BP was soft overnight- holding spironolactone, entresto, and metoprolol to allow BP room for diuresis  - Continue IV lasix 40 mg BID  - Strict I/Os, daily weights  - Note, patient has been admitted multiple times in the past 3 months with CHF exacerbations, COPD exacerbations. Now with hip fracture. Consider palliative care consultation to better establish goals of care   CAD  - Patient previously had PTCA and PCI in 1999, 2005 - Most recent cath from 11/2021 showed CTO of the mid LAD, patent distal RCA stent  - Continue ASA- can hold perioperatively if bleeding risk high  - Continue lipitor 80  - Metoprolol held for  low BP as above      Risk Assessment/Risk Scores:    New York Heart Association (NYHA) Functional Class NYHA Class IV      For questions or updates, please contact [facility] Please consult [email or web address] for contact info under    Signed, Jonita Albee, PA-C  05/01/2023 9:39 AM  Patient seen and examined with KJ PA-C.  Agree as above, with the following exceptions and changes as noted below.  82 year old female with recurrent hospitalizations for heart failure now admitted with hip fracture after mechanical fall.  Mentating clearly but on increased oxygen requirement compared to baseline.  Plan is for repair of right femoral neck fracture with hemiarthroplasty with Dr Blanchie Dessert. Gen: NAD, CV: RRR, 2/6 systolic apical murmur, Lungs: clear, Abd: soft, Extrem: Warm, well perfused, no edema, Neuro/Psych: alert and oriented x 3, normal mood and affect. All available labs, radiology testing, previous records reviewed.   Patient understands that she is high surgical risk at baseline.  She has a severely reduced ejection fraction and now is hypotensive on her heart failure therapy, we will hold her Entresto.  She also has advanced COPD on home oxygen, and resultant  pulmonary hypertension as noted on echo.  In addition, severe mitral valve regurgitation is noted on most recent echo in June.  She is at a high risk for mace undergoing procedures or sedation which may complicate hypotension.  She is also currently experiencing a heart failure decompensation with some signs of volume overload with an increased oxygen requirement.  At this time she would be prohibitive risk for general anesthesia.  If general anesthesia is required emergently, cardiac trained anesthesiologist would be recommended. She is high risk for intermediate risk surgery with regional anesthesia.  We will optimize the patient with diuresis today, primary service has ordered a repeat echocardiogram but I suspect this will not change management.  No other testing is required to further optimize the patient.  We will reevaluate the patient in the morning for appropriateness to proceed to the OR.  Parke Poisson, MD 05/01/23 1:04 PM

## 2023-05-01 NOTE — Consult Note (Signed)
ORTHOPAEDIC CONSULTATION  REQUESTING PHYSICIAN: Miguel Rota, MD  Chief Complaint: Right femoral neck fracture  HPI: Angela Lucero is a 82 y.o. female who complains of pain in her right hip sustained after a fall the other day.  She was initially [facility] but due to exacerbation of heart failure and no ICU availability recommended transfer for higher level of care.  She denies distal numbness and tingling.  She notes some soreness in her right shoulder but able to move fully without any limitations.  She denies pain other joints or extremities.  She denies distal numbness and tingling.  Past Medical History:  Diagnosis Date   Acute hypoxic respiratory failure (HCC) 02/04/2023   Acute on chronic HFrEF (heart failure with reduced ejection fraction) (HCC) 02/05/2023   Acute systolic heart failure (HCC) 12/14/2021   Anxiety disorder, unspecified 03/16/2023   Arthritis 2010   Lower back and neck   CAD (coronary artery disease)    Cardiomyopathy (HCC) 01/01/2022   Chronic edema 12/18/2021   Bilateral lower extremities         Congestive heart failure (CHF) (HCC) 12/10/2021   COPD (chronic obstructive pulmonary disease) (HCC)    COPD exacerbation (HCC) 02/05/2023   Coronary artery disease PTCA and stenting in 1997 and 2005 in [place] 12/10/2021   CVA (cerebral vascular accident) (HCC)    1997, 2005 stents   Diabetes (HCC)    Diabetes mellitus, type II (HCC)    Gout 1998   Heart attack (HCC)    4132,4401   Hyperlipidemia    Hypertension    Hypertriglyceridemia    Muscle wasting and atrophy, not elsewhere classified, multiple sites 03/16/2023   Non-ST elevation (NSTEMI) myocardial infarction (HCC) 12/14/2021   Nonrheumatic mitral (valve) insufficiency 03/16/2023   Type 2 diabetes mellitus with complication, without long-term current use of insulin (HCC) 02/01/2018   Past Surgical History:  Procedure Laterality Date   APPENDECTOMY  1971   CHOLECYSTECTOMY     LEFT HEART CATH  AND CORONARY ANGIOGRAPHY N/A 12/14/2021   Procedure: LEFT HEART CATH AND CORONARY ANGIOGRAPHY;  Surgeon: Yvonne Kendall, MD;  Location: [facility] INVASIVE [facility];  Service: Cardiovascular;  Laterality: N/A;   Navel hernia repair  2007   PARTIAL HYSTERECTOMY  1976   Stent Heart     TUBAL LIGATION  1974   Ulna nerve transpost L arm     Social History   Socioeconomic History   Marital status: Widowed    Spouse name: Not on file   Number of children: Not on file   Years of education: Not on file   Highest education level: Not on file  Occupational History   Not on file  Tobacco Use   Smoking status: Former    Current packs/day: 0.00    Types: Cigarettes    Quit date: 01/2023    Years since quitting: 0.2   Smokeless tobacco: Never  Vaping Use   Vaping status: Never Used  Substance and Sexual Activity   Alcohol use: Not Currently   Drug use: Never   Sexual activity: Not Currently  Other Topics Concern   Not on file  Social History Narrative   Not on file   Social Determinants of Health   Financial Resource Strain: Low Risk  (02/10/2023)   Overall Financial Resource Strain (CARDIA)    Difficulty of Paying Living Expenses: Not very hard  Food Insecurity: No Food Insecurity (02/04/2023)   Hunger Vital Sign    Worried About Running Out of Food  in the Last Year: Never true    Ran Out of Food in the Last Year: Never true  Transportation Needs: No Transportation Needs (02/10/2023)   PRAPARE - Administrator, Civil Service (Medical): No    Lack of Transportation (Non-Medical): No  Physical Activity: Not on file  Stress: Not on file  Social Connections: Not on file   Family History  Problem Relation Age of Onset   Heart disease Mother    Hypertension Mother    Heart disease Father    Hypertension Father    Cancer Brother    No Known Allergies   Positive ROS: All other systems have been reviewed and were otherwise negative with the exception of those mentioned in the  HPI and as above.  Physical Exam: General: Alert, no acute distress Cardiovascular: No pedal edema Respiratory: No cyanosis, no use of accessory musculature Skin: No lesions in the area of chief complaint Neurologic: Sensation intact distally Psychiatric: Patient is competent for consent with normal mood and affect  MUSCULOSKELETAL:  RLE No traumatic wounds, ecchymosis, or rash  Nontender  Groin pain with log roll  No knee or ankle effusion  Knee stable to varus/ valgus stress  Sens DPN, SPN, TN intact  Motor EHL, ext, flex 5/5  DP 2+, PT 2+, No significant edema  LLE No traumatic wounds, ecchymosis, or rash  Nontender  No groin pain with log roll  No knee or ankle effusion  Knee stable to varus/ valgus stress  Sens DPN, SPN, TN intact  Motor EHL, ext, flex 5/5  DP 2+, PT 2+, No significant edema   IMAGING: X-ray reports describe right femoral neck fracture no imaging available to review  Assessment: Principal Problem:   Closed right hip fracture, initial encounter (HCC) Active Problems:   Acute exacerbation of CHF (congestive heart failure) (HCC)   COPD (chronic obstructive pulmonary disease) (HCC)   GAD (generalized anxiety disorder)   Hyperlipidemia   Displaced fracture of right femoral neck (HCC)   History of CAD (coronary artery disease)   Chronic hypoxic respiratory failure (HCC)   Chronic kidney disease (CKD), stage III (moderate) (HCC)  Right femoral neck fracture  Plan: Right femoral neck fracture discussed would benefit from hemiarthroplasty.  Will obtain new x-rays as x-rays from [place] are not available.  Risks and benefits of hemiarthroplasty surgery were discussed.The risks benefits and alternatives were discussed with the patient including but not limited to the risks of nonoperative treatment, versus surgical intervention including infection, bleeding, nerve injury, periprosthetic fracture, the need for revision surgery, dislocation, leg length  discrepancy, blood clots, cardiopulmonary complications, morbidity, mortality, among others, and they were willing to proceed.     Joen Laura, MD  Contact information:   [email or web address] 7am-5pm epic message Dr. Blanchie Dessert, or call office for patient follow up: [phone] After hours and holidays please check [email or web address] for group call information for [facility]

## 2023-05-01 NOTE — Plan of Care (Signed)

## 2023-05-01 NOTE — Progress Notes (Signed)
   05/01/23 0734  Assess: MEWS Score  Temp 98.9 F (37.2 C)  BP (!) 92/58  MAP (mmHg) 68  ECG Heart Rate (!) 111  Resp 18  SpO2 97 %  O2 Device Room Air  Assess: MEWS Score  MEWS Temp 0  MEWS Systolic 1  MEWS Pulse 2  MEWS RR 0  MEWS LOC 0  MEWS Score 3  MEWS Score Color Yellow  Assess: if the MEWS score is Yellow or Red  Were vital signs accurate and taken at a resting state? Yes  Does the patient meet 2 or more of the SIRS criteria? No  Does the patient have a confirmed or suspected source of infection? No  MEWS guidelines implemented  No, previously yellow, continue vital signs every 4 hours  Notify: Charge Nurse/RN  Name of Charge Nurse/RN Notified Lauren RN  Provider Notification  Provider Name/Title Stephania Fragmin MD  Date Provider Notified 05/01/23  Time Provider Notified [phone]  Method of Notification Face-to-face  Provider response At bedside  Date of Provider Response 05/01/23  Time of Provider Response 0755  Assess: SIRS CRITERIA  SIRS Temperature  0  SIRS Pulse 1  SIRS Respirations  0  SIRS WBC 0  SIRS Score Sum  1   MD Amin made aware. At bedside to assess. Cardiology to see. Hold morning meds until further notice.

## 2023-05-01 NOTE — Progress Notes (Signed)
*  PRELIMINARY RESULTS* Echocardiogram Limited 2-D Echocardiogram  has been performed with Definity.  Stacey Drain 05/01/2023, 3:43 PM

## 2023-05-01 NOTE — Progress Notes (Signed)
PROGRESS NOTE    Angela Lucero  ZOX:[id number] DOB: 04/02/1941 DOA: 05/01/2023 PCP: [name], MD   Brief Narrative:  82 year old with history of systolic CHF EF 20-25%, CAD status post PCI, COPD on 2-3 L nasal cannula, CKD stage IIIa, CVA, HLD, HTN, DM2 comes to the [facility] ED for evaluation of fall while using her walker and sustaining right hip fracture and was also noted to be in CHF exacerbation therefore transferred to [facility] for further orthopedic and cardiology care as well.  She was recently admitted at [facility] from 8/16 - 8/19 for NSTEMI and CHF exacerbation treated with diuretics and eventually discharged.  Admits of medication compliance.   Assessment & Plan:  Principal Problem:   Closed right hip fracture, initial encounter [facility]) Active Problems:   Displaced fracture of right femoral neck (HCC)   Acute exacerbation of CHF (congestive heart failure) (HCC)   COPD (chronic obstructive pulmonary disease) (HCC)   GAD (generalized anxiety disorder)   Hyperlipidemia   History of CAD (coronary artery disease)   Chronic hypoxic respiratory failure (HCC)   Chronic kidney disease (CKD), stage III (moderate) (HCC)    Right-sided femoral neck fracture; Closed This was after mechanical fall.  Orthopedic consulted, planning on ORIF once she is medically more stabilized.  She is a high risk for surgery, recommend cardiac optimization prior to proceeding with any form of surgical intervention.   Acute on chronic hypoxic respiratory distress Acute congestive heart failure with reduced EF, 20%, class III Patient's recent echocardiogram has shown EF of 20-25%.  At this time blood pressure is soft with some tachycardia.  She is on Toprol, Lasix, Entresto.  Follows closely outpatient with Dr. Bing Matter.  Went ahead and consulted [facility] cardiology to help in management and optimizing especially if she is going to go through surgery.    Chronic hypoxic respiratory  failure History of COPD Continue supplemental oxygen.  As needed bronchodilators.  Also continue home Anoro   CKD stage 3B Creatinine is currently stable around baseline of 1.6     History of CAD Currently chest pain-free.  Recent NSTEMI 2 weeks ago.  Currently on cardiac medications.  Further adjustments per cardiology team   Generalized anxiety disorder -Continue BuSpar 10 mg 3 times daily and Klonopin 0.5 mg twice daily as needed.     Hyperlipidemia -Continue Lipitor   DM type II Currently p.o. meds on hold.  Sliding scale and Accu-Cheks   DVT prophylaxis:  SQ Heparin Code Status: DNR Diet: Currently n.p.o. Family Communication: Updated Family Consults: Orthopedic surgeon & Cardiology.   Continue hospital stay for CHF exacerbation management and right hip fracture.  Subjective:  Patient seen at bedside.  Tells me she does have some orthopnea and feeling slightly more short of breath than usual.  Also reports of right extremity pain as expected.  Examination:  General exam: Appears calm and comfortable  Respiratory system: Lateral rhonchi Cardiovascular system: S1 & S2 heard, RRR. No JVD, murmurs, rubs, gallops or clicks. 3+ b/l LE pitting.  Gastrointestinal system: Abdomen is nondistended, soft and nontender. No organomegaly or masses felt. Normal bowel sounds heard. Central nervous system: Alert and oriented. No focal neurological deficits. Extremities: Symmetric 5 x 5 power.  Limited range of motion of right lower extremity by Skin: No rashes, lesions or ulcers Psychiatry: Judgement and insight appear normal. Mood & affect appropriate.      Diet Orders (From admission, onward)     Start     Ordered  05/02/23 0001  Diet NPO time specified  Diet effective midnight        05/01/23 0408            Objective: Vitals:   05/01/23 0500 05/01/23 0611 05/01/23 0734 05/01/23 0828  BP:  (!) 88/45 (!) 92/58   Pulse:  (!) 111  (!) 101  Resp:   18   Temp:  98.4  F (36.9 C) 98.9 F (37.2 C)   TempSrc:  Oral Oral   SpO2:  97% 97% 98%  Weight: 81.9 kg      No intake or output data in the 24 hours ending 05/01/23 0936 Filed Weights   05/01/23 0500  Weight: 81.9 kg    Scheduled Meds:  aspirin EC  81 mg Oral Daily   atorvastatin  80 mg Oral Daily   busPIRone  10 mg Oral TID   furosemide  40 mg Intravenous BID   melatonin  5 mg Oral QHS   metoprolol succinate  25 mg Oral Daily   pantoprazole  40 mg Oral Daily   sacubitril-valsartan  1 tablet Oral BID   sodium chloride flush  3 mL Intravenous Q12H   umeclidinium-vilanterol  1 puff Inhalation Daily   Continuous Infusions:  sodium chloride      Nutritional status     Body mass index is 29.14 kg/m.  Data Reviewed:   CBC: Recent Labs  Lab 05/01/23 0636  WBC 8.9  NEUTROABS 7.4  HGB 11.0*  HCT 35.9*  MCV 92.8  PLT 174   Basic Metabolic Panel: Recent Labs  Lab 04/25/23 1620 05/01/23 0636  NA 137 136  K 4.7 4.2  CL 95* 96*  CO2 27 30  GLUCOSE 94 147*  BUN 50* 49*  CREATININE 1.55* 1.66*  CALCIUM 9.5 8.9   GFR: Estimated Creatinine Clearance: 29.1 mL/min (A) (by C-G formula based on SCr of 1.66 mg/dL (H)). Liver Function Tests: Recent Labs  Lab 04/25/23 1620 05/01/23 0636  AST 20 24  ALT 11 18  ALKPHOS 66 58  BILITOT 0.4 0.9  PROT 6.1 6.0*  ALBUMIN 3.8 2.9*   No results for input(s): "LIPASE", "AMYLASE" in the last 168 hours. No results for input(s): "AMMONIA" in the last 168 hours. Coagulation Profile: Recent Labs  Lab 05/01/23 0636  INR 1.1   Cardiac Enzymes: No results for input(s): "CKTOTAL", "CKMB", "CKMBINDEX", "TROPONINI" in the last 168 hours. BNP (last 3 results) No results for input(s): "PROBNP" in the last 8760 hours. HbA1C: No results for input(s): "HGBA1C" in the last 72 hours. CBG: Recent Labs  Lab 05/01/23 0629  GLUCAP 147*   Lipid Profile: No results for input(s): "CHOL", "HDL", "LDLCALC", "TRIG", "CHOLHDL", "LDLDIRECT" in the  last 72 hours. Thyroid Function Tests: No results for input(s): "TSH", "T4TOTAL", "FREET4", "T3FREE", "THYROIDAB" in the last 72 hours. Anemia Panel: No results for input(s): "VITAMINB12", "FOLATE", "FERRITIN", "TIBC", "IRON", "RETICCTPCT" in the last 72 hours. Sepsis Labs: No results for input(s): "PROCALCITON", "LATICACIDVEN" in the last 168 hours.  Recent Results (from the past 240 hour(s))  Surgical pcr screen     Status: Abnormal   Collection Time: 05/01/23  3:53 AM   Specimen: Nasal Mucosa; Nasal Swab  Result Value Ref Range Status   MRSA, PCR NEGATIVE NEGATIVE Final   Staphylococcus aureus POSITIVE (A) NEGATIVE Final    Comment: (NOTE) The Xpert SA Assay (FDA approved for NASAL specimens in patients 79 years of age and older), is one component of a comprehensive surveillance program. It  is not intended to diagnose infection nor to guide or monitor treatment. Performed at [facility], [address] [place], [place] [address]          Radiology Studies: No results found.         LOS: 0 days   Time spent= 35 mins    Miguel Rota, MD [facility]  If 7PM-7AM, please contact night-coverage  05/01/2023, 9:36 AM

## 2023-05-02 DIAGNOSIS — I5023 Acute on chronic systolic (congestive) heart failure: Secondary | ICD-10-CM

## 2023-05-02 DIAGNOSIS — S72001A Fracture of unspecified part of neck of right femur, initial encounter for closed fracture: Secondary | ICD-10-CM | POA: Diagnosis not present

## 2023-05-02 LAB — CBC
HCT: 35.2 % — ABNORMAL LOW (ref 36.0–46.0)
Hemoglobin: 10.5 g/dL — ABNORMAL LOW (ref 12.0–15.0)
MCH: 27.3 pg (ref 26.0–34.0)
MCHC: 29.8 g/dL — ABNORMAL LOW (ref 30.0–36.0)
MCV: 91.4 fL (ref 80.0–100.0)
Platelets: 167 10*3/uL (ref 150–400)
RBC: 3.85 MIL/uL — ABNORMAL LOW (ref 3.87–5.11)
RDW: 18.1 % — ABNORMAL HIGH (ref 11.5–15.5)
WBC: 9.4 10*3/uL (ref 4.0–10.5)
nRBC: 0 % (ref 0.0–0.2)

## 2023-05-02 LAB — COMPREHENSIVE METABOLIC PANEL
ALT: 17 U/L (ref 0–44)
AST: 21 U/L (ref 15–41)
Albumin: 2.7 g/dL — ABNORMAL LOW (ref 3.5–5.0)
Alkaline Phosphatase: 58 U/L (ref 38–126)
Anion gap: 8 (ref 5–15)
BUN: 53 mg/dL — ABNORMAL HIGH (ref 8–23)
CO2: 29 mmol/L (ref 22–32)
Calcium: 8.6 mg/dL — ABNORMAL LOW (ref 8.9–10.3)
Chloride: 99 mmol/L (ref 98–111)
Creatinine, Ser: 1.75 mg/dL — ABNORMAL HIGH (ref 0.44–1.00)
GFR, Estimated: 29 mL/min — ABNORMAL LOW (ref >60)
Glucose, Bld: 148 mg/dL — ABNORMAL HIGH (ref 70–99)
Potassium: 4.2 mmol/L (ref 3.5–5.1)
Sodium: 136 mmol/L (ref 135–145)
Total Bilirubin: 0.9 mg/dL (ref 0.3–1.2)
Total Protein: 5.8 g/dL — ABNORMAL LOW (ref 6.5–8.1)

## 2023-05-02 LAB — PHOSPHORUS: Phosphorus: 3.8 mg/dL (ref 2.5–4.6)

## 2023-05-02 LAB — MAGNESIUM: Magnesium: 1.7 mg/dL (ref 1.7–2.4)

## 2023-05-02 LAB — GLUCOSE, CAPILLARY
Glucose-Capillary: 140 mg/dL — ABNORMAL HIGH (ref 70–99)
Glucose-Capillary: 163 mg/dL — ABNORMAL HIGH (ref 70–99)
Glucose-Capillary: 132 mg/dL — ABNORMAL HIGH (ref 70–99)
Glucose-Capillary: 143 mg/dL — ABNORMAL HIGH (ref 70–99)

## 2023-05-02 LAB — T4, FREE: Free T4: 0.68 ng/dL (ref 0.61–1.12)

## 2023-05-02 MED ORDER — INSULIN ASPART 100 UNIT/ML IJ SOLN
0.0000 [IU] | Freq: Three times a day (TID) | INTRAMUSCULAR | Status: DC
  Administered 2023-05-02: 2 [IU] via SUBCUTANEOUS
  Administered 2023-05-02: 1 [IU] via SUBCUTANEOUS
  Administered 2023-05-03 – 2023-05-04 (×2): 2 [IU] via SUBCUTANEOUS
  Administered 2023-05-04: 1 [IU] via SUBCUTANEOUS
  Administered 2023-05-04: 2 [IU] via SUBCUTANEOUS
  Administered 2023-05-05: 1 [IU] via SUBCUTANEOUS
  Administered 2023-05-05: 2 [IU] via SUBCUTANEOUS
  Administered 2023-05-05: 3 [IU] via SUBCUTANEOUS
  Administered 2023-05-06 (×3): 2 [IU] via SUBCUTANEOUS
  Administered 2023-05-07: 3 [IU] via SUBCUTANEOUS
  Administered 2023-05-07: 2 [IU] via SUBCUTANEOUS
  Administered 2023-05-07: 3 [IU] via SUBCUTANEOUS
  Administered 2023-05-08: 1 [IU] via SUBCUTANEOUS
  Administered 2023-05-08 – 2023-05-09 (×4): 2 [IU] via SUBCUTANEOUS

## 2023-05-02 MED ORDER — FUROSEMIDE 10 MG/ML IJ SOLN
80.0000 mg | Freq: Once | INTRAMUSCULAR | Status: AC
  Administered 2023-05-02: 80 mg via INTRAVENOUS
  Filled 2023-05-02: qty 8

## 2023-05-02 MED ORDER — FUROSEMIDE 10 MG/ML IJ SOLN
80.0000 mg | Freq: Two times a day (BID) | INTRAMUSCULAR | Status: DC
  Administered 2023-05-02 – 2023-05-06 (×9): 80 mg via INTRAVENOUS
  Filled 2023-05-02 (×8): qty 8

## 2023-05-02 MED ORDER — METOPROLOL SUCCINATE ER 25 MG PO TB24
12.5000 mg | ORAL_TABLET | Freq: Every day | ORAL | Status: DC
  Administered 2023-05-02 – 2023-05-07 (×5): 12.5 mg via ORAL
  Filled 2023-05-02 (×4): qty 1

## 2023-05-02 NOTE — Progress Notes (Signed)
   Patient Name: Angela Lucero Date of Encounter: 05/02/2023 [facility] Cardiologist: Gypsy Balsam, MD   Interval Summary  .    Short of breath, complaining of hip and shoulder pain, waiting for pain medication. No chest pain.   Vital Signs .    Vitals:   05/01/23 2309 05/02/23 0426 05/02/23 0724 05/02/23 0802  BP: (!) 106/57 113/64  113/72  Pulse: 98   (!) 114  Resp:  17  (!) 27  Temp:  98 F (36.7 C)  98.6 F (37 C)  TempSrc:    Oral  SpO2: 97% 97%  97%  Weight:   82.6 kg     Intake/Output Summary (Last 24 hours) at 05/02/2023 0920 Last data filed at 05/01/2023 2216 Gross per 24 hour  Intake 240 ml  Output 950 ml  Net -710 ml      05/02/2023    7:24 AM 05/01/2023    5:00 AM 04/25/2023    3:40 PM  Last 3 Weights  Weight (lbs) 182 lb 1.6 oz 180 lb 8.9 oz 180 lb 8 oz  Weight (kg) 82.6 kg 81.9 kg 81.874 kg      Telemetry/ECG    Sinus tachycardia HR 110-115 bpm - Personally Reviewed  Physical Exam .   GEN: No acute distress.  Elderly, on 4.5 L O2 per  Neck: No JVD Cardiac: tachy and regular, no murmur appreciable today Respiratory: diminished in the bases, rhonchi and crackles bilaterally. GI: Soft, nontender, non-distended  MS: 1+ bilateral pretibial edema  Assessment & Plan .     Acute on chronic HFrEF: pt with LVEF 25%, severe MR, severe pulmonary HTN. Her O2 requirement is increased. Weight essentially unchanged from yesterday. I/O's may not be accurate per bedside RN. With crackles on exam, increased O2 requirement, I think she needs further diuresis prior to going to the OR. As previously outlined, with her degree of HF and COPD, she will be a high-risk operative candidate even with better optimization of her CHF. She seems to be in very significant pain from her hip fracture and I agree that it is appropriate to try to get her to the OR for hip hemiarthroplasty if possible. Plan:  Increase IV lasix to 40 mg BID today Repeat portable CXR in early am  tomorrow Decrease metoprolol succinate to 12.5 mg daily in setting volume overload Hold other GDMT to allow BP for diuresis and reduce risk of AKI Severe MR: likely ischemic, heart failure related. I reviewed yesterday's echo and there is no doppler assessment for valve disease. LVEF unchanged from prior. Not a candidate for valve intervention. Continue HF therapy. AKI on CKD 3b: creatinine mildly increased from baseline today, now up to 1.75. still volume overloaded so continue with diuresis and repeat in am.   Overall this patient is in a very tenuous situation with her degree of heart failure and COPD, in need of operative repair of a hip fracture. Plan as above, cards team will reassess in am. Labs and CXR ordered for tomorrow am.   For questions or updates, please contact  [facility] Please consult [email or web address] for contact info under   Signed, Tonny Bollman, MD

## 2023-05-02 NOTE — TOC CAGE-AID Note (Signed)
Transition of Care [facility]) - CAGE-AID Screening   Patient Details  Name: Angela Lucero MRN: [id number] Date of Birth: 15-Jul-1941  Transition of Care [facility]) CM/SW Contact:    Janora Norlander, RN Phone Number: [phone] 05/02/2023, 9:52 PM   Clinical Narrative: Pt here after falling and sustaining a right hip fracture.  Pt states that she quit smoking about 3 months ago and reports that she does not drink alcohol and does not do drugs.  Screening complete.   CAGE-AID Screening:    Have You Ever Felt You Ought to Cut Down on Your Drinking or Drug Use?: No Have People Annoyed You By Critizing Your Drinking Or Drug Use?: No Have You Felt Bad Or Guilty About Your Drinking Or Drug Use?: No Have You Ever Had a Drink or Used Drugs First Thing In The Morning to Steady Your Nerves or to Get Rid of a Hangover?: No CAGE-AID Score: 0  Substance Abuse Education Offered: No

## 2023-05-02 NOTE — Progress Notes (Signed)
PROGRESS NOTE    Angela Lucero  UJW:[id number] DOB: December 08, 1940 DOA: 05/01/2023 PCP: [name], MD     Brief Narrative:  82 year old with history of systolic CHF EF 20-25%, CAD status post PCI, COPD on 2-3 L nasal cannula, CKD stage IIIa, CVA, HLD, HTN, DM2 comes to the [facility] ED for evaluation of fall while using her walker and sustaining right hip fracture and was also noted to be in CHF exacerbation therefore transferred to [facility] for further orthopedic and cardiology care as well.  She was recently admitted at [facility] from 8/16 - 8/19 for NSTEMI and CHF exacerbation treated with diuretics and eventually discharged.  Admits of medication compliance.  Upon admission, cardiology was consulted.  Patient was started on diuretics.     Assessment & Plan:  Principal Problem:   Closed right hip fracture, initial encounter [facility]) Active Problems:   Displaced fracture of right femoral neck (HCC)   Acute exacerbation of CHF (congestive heart failure) (HCC)   COPD (chronic obstructive pulmonary disease) (HCC)   GAD (generalized anxiety disorder)   Hyperlipidemia   History of CAD (coronary artery disease)   Chronic hypoxic respiratory failure (HCC)   Chronic kidney disease (CKD), stage III (moderate) (HCC)     Right-sided femoral neck fracture; Closed This was after mechanical fall.  Orthopedic consulted, planning on ORIF once she is medically more stabilized.  Currently being managed from cardiac standpoint but regardless she will remain high risk.   Acute on chronic hypoxic respiratory distress Acute congestive heart failure with reduced EF, 20%, class III Patient's recent echocardiogram has shown EF of 20-25%.  At this time blood pressure is soft with some tachycardia.  Seen by cardiology.  She is on Toprol, Lasix, Entresto.  Increase Lasix IV today.  Acceptable to have some rising creatinine.  Tachycardia/tachypnea - Ordered repeat EKG.  On IV Lasix.     Chronic  hypoxic respiratory failure History of COPD Continue supplemental oxygen.  As needed bronchodilators.  Also continue home Anoro   CKD stage 3B Creatinine is currently stable around baseline of 1.6     History of CAD Currently chest pain-free.  Recent NSTEMI 2 weeks ago.  Currently on cardiac medications.  Further adjustments per cardiology team   Generalized anxiety disorder -Continue BuSpar 10 mg 3 times daily and Klonopin 0.5 mg twice daily as needed.     Hyperlipidemia -Continue Lipitor   DM type II Currently p.o. meds on hold.  Sliding scale and Accu-Cheks   DVT prophylaxis:  SQ Heparin Code Status: DNR Diet:Cardiac diet Family Communication: Son at bedside Consults: Orthopedic surgeon & Cardiology.    Continue hospital stay for CHF exacerbation management and right hip fracture.     Subjective: Patient seen and examined at bedside.  Reporting of some hip pain as expected.  She does agree that she wants to proceed with surgery but understands she is not a high risk candidate.  Son is present at bedside. They confirm patient should remain DNR/DNI but okay with intubation during the surgery  Examination:  General exam: Appears calm and comfortable, 5 L nasal cannula Respiratory system: Bilateral rhonchi Cardiovascular system: S1 & S2 heard, RRR. No JVD, murmurs, rubs, gallops or clicks. No pedal edema. Gastrointestinal system: Abdomen is nondistended, soft and nontender. No organomegaly or masses felt. Normal bowel sounds heard. Central nervous system: Alert and oriented. No focal neurological deficits. Extremities: Symmetric 4 x 5 power. Skin: No rashes, lesions or ulcers Psychiatry: Judgement and insight appear  normal. Mood & affect appropriate.       Diet Orders (From admission, onward)     Start     Ordered   05/02/23 0830  Diet Heart Room service appropriate? Yes; Fluid consistency: Thin; Fluid restriction: 1500 mL Fluid  Diet effective now       Question  Answer Comment  Room service appropriate? Yes   Fluid consistency: Thin   Fluid restriction: 1500 mL Fluid      05/02/23 0830            Objective: Vitals:   05/01/23 2309 05/02/23 0426 05/02/23 0724 05/02/23 0802  BP: (!) 106/57 113/64  113/72  Pulse: 98   (!) 114  Resp:  17  (!) 27  Temp:  98 F (36.7 C)  98.6 F (37 C)  TempSrc:    Oral  SpO2: 97% 97%  97%  Weight:   82.6 kg     Intake/Output Summary (Last 24 hours) at 05/02/2023 1109 Last data filed at 05/01/2023 2216 Gross per 24 hour  Intake 240 ml  Output 950 ml  Net -710 ml   Filed Weights   05/01/23 0500 05/02/23 0724  Weight: 81.9 kg 82.6 kg    Scheduled Meds:  aspirin EC  81 mg Oral Daily   atorvastatin  80 mg Oral Daily   busPIRone  10 mg Oral TID   Chlorhexidine Gluconate Cloth  6 each Topical Q0600   furosemide  80 mg Intravenous BID   furosemide  80 mg Intravenous Once   insulin aspart  0-9 Units Subcutaneous TID WC   melatonin  5 mg Oral QHS   metoprolol succinate  12.5 mg Oral Daily   mupirocin ointment  1 Application Nasal BID   pantoprazole  40 mg Oral Daily   sodium chloride flush  3 mL Intravenous Q12H   umeclidinium-vilanterol  1 puff Inhalation Daily   Continuous Infusions:  sodium chloride      Nutritional status     Body mass index is 29.39 kg/m.  Data Reviewed:   CBC: Recent Labs  Lab 05/01/23 0636 05/02/23 0309  WBC 8.9 9.4  NEUTROABS 7.4  --   HGB 11.0* 10.5*  HCT 35.9* 35.2*  MCV 92.8 91.4  PLT 174 167   Basic Metabolic Panel: Recent Labs  Lab 04/25/23 1620 05/01/23 0636 05/02/23 0309  NA 137 136 136  K 4.7 4.2 4.2  CL 95* 96* 99  CO2 27 30 29   GLUCOSE 94 147* 148*  BUN 50* 49* 53*  CREATININE 1.55* 1.66* 1.75*  CALCIUM 9.5 8.9 8.6*  MG  --   --  1.7  PHOS  --   --  3.8   GFR: Estimated Creatinine Clearance: 27.8 mL/min (A) (by C-G formula based on SCr of 1.75 mg/dL (H)). Liver Function Tests: Recent Labs  Lab 04/25/23 1620 05/01/23 0636  05/02/23 0309  AST 20 24 21   ALT 11 18 17   ALKPHOS 66 58 58  BILITOT 0.4 0.9 0.9  PROT 6.1 6.0* 5.8*  ALBUMIN 3.8 2.9* 2.7*   No results for input(s): "LIPASE", "AMYLASE" in the last 168 hours. No results for input(s): "AMMONIA" in the last 168 hours. Coagulation Profile: Recent Labs  Lab 05/01/23 0636  INR 1.1   Cardiac Enzymes: No results for input(s): "CKTOTAL", "CKMB", "CKMBINDEX", "TROPONINI" in the last 168 hours. BNP (last 3 results) No results for input(s): "PROBNP" in the last 8760 hours. HbA1C: No results for input(s): "HGBA1C" in the last 72 hours.  CBG: Recent Labs  Lab 05/01/23 0629 05/01/23 1811 05/01/23 2358 05/02/23 0717  GLUCAP 147* 160* 127* 140*   Lipid Profile: No results for input(s): "CHOL", "HDL", "LDLCALC", "TRIG", "CHOLHDL", "LDLDIRECT" in the last 72 hours. Thyroid Function Tests: Recent Labs    05/01/23 0636 05/02/23 0309  TSH 6.532*  --   FREET4  --  0.68   Anemia Panel: Recent Labs    05/01/23 0636  VITAMINB12 1,012*   Sepsis Labs: No results for input(s): "PROCALCITON", "LATICACIDVEN" in the last 168 hours.  Recent Results (from the past 240 hour(s))  Surgical pcr screen     Status: Abnormal   Collection Time: 05/01/23  3:53 AM   Specimen: Nasal Mucosa; Nasal Swab  Result Value Ref Range Status   MRSA, PCR NEGATIVE NEGATIVE Final   Staphylococcus aureus POSITIVE (A) NEGATIVE Final    Comment: (NOTE) The Xpert SA Assay (FDA approved for NASAL specimens in patients 47 years of age and older), is one component of a comprehensive surveillance program. It is not intended to diagnose infection nor to guide or monitor treatment. Performed at [facility], [address], [place], [place] [address]          Radiology Studies: ECHOCARDIOGRAM LIMITED  Result Date: 05/01/2023    ECHOCARDIOGRAM LIMITED REPORT   Patient Name:   JAYME MCIRVIN  Date of Exam: 05/01/2023 Medical Rec #:  [id number]  Height:       66.0 in Accession #:     [id number] Weight:       180.6 lb Date of Birth:  06/24/1941  BSA:          1.915 m Patient Age:    81 years   BP:           92/58 mmHg Patient Gender: F          HR:           101 bpm. Exam Location:  Inpatient Procedure: Limited Echo and Intracardiac Opacification Agent Indications:    CHF-Acute Systolic I50.21  History:        Patient has prior history of Echocardiogram examinations, most                 recent 04/01/2023. CHF, COPD and Stroke; Risk                 Factors:Hypertension, Diabetes and Dyslipidemia.  Sonographer:    Celesta Gentile RCS Referring Phys: [id number] SUBRINA SUNDIL IMPRESSIONS  1. Anterior wall is very thin c/w scar. Left ventricular ejection fraction, by estimation, is 20 to 25%. The left ventricle has severely decreased function. The left ventricular internal cavity size was severely dilated.  2. Right ventricular systolic function is normal. The right ventricular size is mildly enlarged.  3. The mitral valve is normal in structure.  4. The aortic valve is normal in structure.  5. The inferior vena cava is dilated in size with >50% respiratory variability, suggesting right atrial pressure of 8 mmHg. Conclusion(s)/Recommendation(s): Limited study. No detailed valve assessment. Otherwise no significant changes from prior study. FINDINGS  Left Ventricle: Anterior wall is very thin c/w scar. Left ventricular ejection fraction, by estimation, is 20 to 25%. The left ventricle has severely decreased function. Definity contrast agent was given IV to delineate the left ventricular endocardial borders. The left ventricular internal cavity size was severely dilated. Right Ventricle: The right ventricular size is mildly enlarged. Right ventricular systolic function is normal. Pericardium: There is no evidence of pericardial  effusion. Mitral Valve: The mitral valve is normal in structure. Aortic Valve: The aortic valve is normal in structure. Venous: The inferior vena cava is dilated in size with  greater than 50% respiratory variability, suggesting right atrial pressure of 8 mmHg. LEFT VENTRICLE PLAX 2D LVIDd:         6.80 cm LVIDs:         6.00 cm LV PW:         1.20 cm LV IVS:        0.70 cm LVOT diam:     1.90 cm LVOT Area:     2.84 cm  LV Volumes (MOD) LV vol d, MOD A2C: 157.0 ml LV vol d, MOD A4C: 190.0 ml LV vol s, MOD A2C: 138.0 ml LV vol s, MOD A4C: 132.0 ml LV SV MOD A2C:     19.0 ml LV SV MOD A4C:     190.0 ml LV SV MOD BP:      40.9 ml RIGHT VENTRICLE TAPSE (M-mode): 2.4 cm LEFT ATRIUM         Index LA diam:    4.10 cm 2.14 cm/m   AORTA Ao Root diam: 3.60 cm  SHUNTS Systemic Diam: 1.90 cm Carolan Clines Electronically signed by Carolan Clines Signature Date/Time: 05/01/2023/4:33:23 PM    Final    DG Knee 2 Views Right  Result Date: 05/01/2023 CLINICAL DATA:  Right hip fracture. EXAM: RIGHT KNEE - 3 VIEW COMPARISON:  None Available. FINDINGS: There is diffuse decreased bone mineralization. Moderate lateral and mild medial compartment joint space narrowing and peripheral osteophytosis. Mild patellofemoral joint space narrowing and superior osteophytosis. Small joint effusion. Minimal chronic enthesopathic change at the quadriceps insertion on the patella. High-grade atherosclerotic calcifications. No acute fracture or dislocation. IMPRESSION: 1. 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 2. Small joint effusion. Electronically Signed   By: Neita Garnet M.D.   On: 05/01/2023 11:19   DG HIP UNILAT WITH PELVIS 2-3 VIEWS RIGHT  Result Date: 05/01/2023 CLINICAL DATA:  Right hip fracture. EXAM: DG HIP (WITH OR WITHOUT PELVIS) 2-3V RIGHT COMPARISON:  Pelvis and right hip radiographs 04/30/2023 at 4:26 a.m. FINDINGS: There is diffuse decreased bone mineralization. Redemonstration of acute proximal femoral fracture with approximately 1.9 cm superior displacement of the distal fracture component with respect to the proximal fracture component. The right femoral head  remains appropriately located with respect to the right acetabulum. Mild bilateral femoroacetabular joint space narrowing. Moderate to high-grade atherosclerotic calcifications. IMPRESSION: Redemonstration of acute proximal femoral fracture with approximately 1.9 cm superior displacement of the distal fracture component with respect to the proximal fracture component. Electronically Signed   By: Neita Garnet M.D.   On: 05/01/2023 10:47   DG Chest Port 1 View  Result Date: 05/01/2023 CLINICAL DATA:  Dyspnea.  Right hip fracture EXAM: PORTABLE CHEST 1 VIEW COMPARISON:  04/30/2023 from [facility] FINDINGS: Numerous leads and wires project over the chest. Patient rotated left. Moderate cardiomegaly. Atherosclerosis in the transverse aorta. Possible small left pleural effusion versus artifact secondary to overlying breast tissue. Similar. No pneumothorax. Mild pulmonary venous congestion, likely superimposed upon chronic interstitial thickening. No lobar consolidation. IMPRESSION: Similar appearance of cardiomegaly and pulmonary venous congestion. Possible small left pleural effusion versus artifact from overlying breast tissue. Aortic Atherosclerosis (ICD10-I70.0). Electronically Signed   By: Jeronimo Greaves M.D.   On: 05/01/2023 10:38           LOS: 1 day   Time spent= 35 mins  Miguel Rota, MD [facility]  If 7PM-7AM, please contact night-coverage  05/02/2023, 11:09 AM

## 2023-05-02 NOTE — Hospital Course (Addendum)
  Brief Narrative:  82 year old with history of systolic CHF EF 20-25%, CAD status post PCI, COPD on 2-3 L nasal cannula, CKD stage IIIa, CVA, HLD, HTN, DM2 comes to the [facility] ED for evaluation of fall while using her walker and sustaining right hip fracture and was also noted to be in CHF exacerbation therefore transferred to [facility] for further orthopedic and cardiology care as well.  She was recently admitted at [facility] from 8/16 - 8/19 for NSTEMI and CHF exacerbation treated with diuretics and eventually discharged.  Admits of medication compliance.  Upon admission, cardiology was consulted.  Patient's respirations are slowly improving with aggressive diuretics.  Eventually patient was medically cleared for ORIF on 9/3.  Postoperatively doing well.     Assessment & Plan:  Principal Problem:   Closed right hip fracture, initial encounter [facility]) Active Problems:   Displaced fracture of right femoral neck (HCC)   Acute exacerbation of CHF (congestive heart failure) (HCC)   COPD (chronic obstructive pulmonary disease) (HCC)   GAD (generalized anxiety disorder)   Hyperlipidemia   History of CAD (coronary artery disease)   Chronic hypoxic respiratory failure (HCC)   Chronic kidney disease (CKD), stage III (moderate) (HCC)     Right-sided femoral neck fracture; Closed S/p Right hip hemiarthroplasty 9/3 Surgical procedure was delayed as patient had to be medically optimized from cardiac standpoint.  Eventually underwent ORIF on 9/3.  Postop management including pain management, weightbearing precautions, dressing and follow-up per orthopedic   Acute on chronic hypoxic respiratory distress Acute congestive heart failure with reduced EF, 20%, class III Patient's recent echocardiogram has shown EF of 20-25%.  Volume status improving.  Entresto on hold, diuretics per cardio.  Continue Toprol-XL  Tachycardia/tachypnea - Slowly proving with diuretics   Chronic hypoxic  respiratory failure History of COPD Continue supplemental oxygen.  As needed bronchodilators.  Also continue home Anoro   CKD stage 3B Creatinine is currently stable around baseline of 1.6     History of CAD Currently chest pain-free.  Recent NSTEMI 2 weeks ago.  Currently on cardiac medications.  Further adjustments per cardiology team   Generalized anxiety disorder -Continue BuSpar 10 mg 3 times daily and Klonopin 0.5 mg twice daily as needed.     Hyperlipidemia -Continue Lipitor   DM type II  Sliding scale and Accu-Cheks   DVT prophylaxis:  SQ Heparin Code Status: DNR Diet:Cardiac diet Family Communication: Son Consults: Orthopedic surgeon & Cardiology.  Post op management PT/OT. ON going CHF management per cardio. Hopefully will be medically ready to be discharged in next 24-48 hrs.

## 2023-05-02 NOTE — Progress Notes (Signed)
     Subjective: Patient reports pain as moderate.  Discomfort with range of motion but better with rest.  Patient getting optimized by cardiology prior to proceeding to the OR for hip hemiarthroplasty.  Denies distal numbness and tingling.  No new issues.  Objective:   VITALS:   Vitals:   05/01/23 1413 05/01/23 2029 05/01/23 2309 05/02/23 0426  BP: (!) 105/53 (!) 104/55 (!) 106/57 113/64  Pulse: 99 93 98   Resp: 18   17  Temp:    98 F (36.7 C)  TempSrc:      SpO2: 94% 97% 97% 97%  Weight:        Sensation intact distally Intact pulses distally Dorsiflexion/Plantar flexion intact Compartment soft    Lab Results  Component Value Date   WBC 9.4 05/02/2023   HGB 10.5 (L) 05/02/2023   HCT 35.2 (L) 05/02/2023   MCV 91.4 05/02/2023   PLT 167 05/02/2023   BMET    Component Value Date/Time   NA 136 05/02/2023 0309   NA 137 04/25/2023 1620   K 4.2 05/02/2023 0309   CL 99 05/02/2023 0309   CO2 29 05/02/2023 0309   GLUCOSE 148 (H) 05/02/2023 0309   BUN 53 (H) 05/02/2023 0309   BUN 50 (H) 04/25/2023 1620   CREATININE 1.75 (H) 05/02/2023 0309   CALCIUM 8.6 (L) 05/02/2023 0309   EGFR 34 (L) 04/25/2023 1620   GFRNONAA 29 (L) 05/02/2023 0309    Xray: X-rays reviewed demonstrate displaced and shortened femoral neck fracture on the right.  Assessment/Plan:     Principal Problem:   Closed right hip fracture, initial encounter (HCC) Active Problems:   Acute exacerbation of CHF (congestive heart failure) (HCC)   COPD (chronic obstructive pulmonary disease) (HCC)   GAD (generalized anxiety disorder)   Hyperlipidemia   Displaced fracture of right femoral neck (HCC)   History of CAD (coronary artery disease)   Chronic hypoxic respiratory failure (HCC)   Chronic kidney disease (CKD), stage III (moderate) (HCC)  Right displaced femoral neck fracture  Discussed with the patient plan for hip hemiarthroplasty when she is cleared by medicine and cardiology.  N.p.o. today for  possible OR after patient is evaluated by cardiology.   Nikitia Asbill A Janeann Paisley 05/02/2023, 6:49 AM   Weber Cooks, MD  Contact information:   [phone] 7am-5pm epic message Dr. Blanchie Dessert, or call office for patient follow up: [phone] After hours and holidays please check [email or web address] for group call information for [facility]

## 2023-05-02 NOTE — Plan of Care (Signed)
  Problem: Education: Goal: Knowledge of General Education information will improve Description: Including pain rating scale, medication(s)/side effects and non-pharmacologic comfort measures Outcome: Not Applicable   Problem: Health Behavior/Discharge Planning: Goal: Ability to manage health-related needs will improve Outcome: Not Applicable   Problem: Clinical Measurements: Goal: Ability to maintain clinical measurements within normal limits will improve Outcome: Not Applicable Goal: Will remain free from infection Outcome: Not Applicable Goal: Diagnostic test results will improve Outcome: Not Applicable Goal: Respiratory complications will improve Outcome: Not Applicable Goal: Cardiovascular complication will be avoided Outcome: Not Applicable   Problem: Activity: Goal: Risk for activity intolerance will decrease Outcome: Not Applicable   Problem: Nutrition: Goal: Adequate nutrition will be maintained Outcome: Not Applicable   Problem: Coping: Goal: Level of anxiety will decrease Outcome: Not Applicable   Problem: Elimination: Goal: Will not experience complications related to bowel motility Outcome: Not Applicable Goal: Will not experience complications related to urinary retention Outcome: Not Applicable   Problem: Pain Managment: Goal: General experience of comfort will improve Outcome: Not Applicable   Problem: Safety: Goal: Ability to remain free from injury will improve Outcome: Not Applicable   Problem: Skin Integrity: Goal: Risk for impaired skin integrity will decrease Outcome: Not Applicable   Problem: Education: Goal: Ability to demonstrate management of disease process will improve Outcome: Not Applicable Goal: Ability to verbalize understanding of medication therapies will improve Outcome: Not Applicable Goal: Individualized Educational Video(s) Outcome: Not Applicable   Problem: Activity: Goal: Capacity to carry out activities will  improve Outcome: Not Applicable   Problem: Cardiac: Goal: Ability to achieve and maintain adequate cardiopulmonary perfusion will improve Outcome: Not Applicable   Problem: Education: Goal: Ability to describe self-care measures that may prevent or decrease complications (Diabetes Survival Skills Education) will improve Outcome: Not Applicable Goal: Individualized Educational Video(s) Outcome: Not Applicable   Problem: Coping: Goal: Ability to adjust to condition or change in health will improve Outcome: Not Applicable   Problem: Fluid Volume: Goal: Ability to maintain a balanced intake and output will improve Outcome: Not Applicable   Problem: Health Behavior/Discharge Planning: Goal: Ability to identify and utilize available resources and services will improve Outcome: Not Applicable Goal: Ability to manage health-related needs will improve Outcome: Not Applicable   Problem: Metabolic: Goal: Ability to maintain appropriate glucose levels will improve Outcome: Not Applicable   Problem: Nutritional: Goal: Maintenance of adequate nutrition will improve Outcome: Not Applicable Goal: Progress toward achieving an optimal weight will improve Outcome: Not Applicable   Problem: Skin Integrity: Goal: Risk for impaired skin integrity will decrease Outcome: Not Applicable   Problem: Tissue Perfusion: Goal: Adequacy of tissue perfusion will improve Outcome: Not Applicable

## 2023-05-03 ENCOUNTER — Inpatient Hospital Stay (HOSPITAL_COMMUNITY): Payer: Medicare Other

## 2023-05-03 ENCOUNTER — Encounter (HOSPITAL_COMMUNITY): Payer: Self-pay | Admitting: Internal Medicine

## 2023-05-03 ENCOUNTER — Other Ambulatory Visit: Payer: Self-pay

## 2023-05-03 ENCOUNTER — Encounter (HOSPITAL_COMMUNITY)
Admission: RE | Disposition: A | Discharge status: Skilled Nursing Facility | Payer: Self-pay | Source: Home / Self Care | Attending: Internal Medicine

## 2023-05-03 DIAGNOSIS — I130 Hypertensive heart and chronic kidney disease with heart failure and stage 1 through stage 4 chronic kidney disease, or unspecified chronic kidney disease: Secondary | ICD-10-CM

## 2023-05-03 DIAGNOSIS — Z87891 Personal history of nicotine dependence: Secondary | ICD-10-CM

## 2023-05-03 DIAGNOSIS — Z01810 Encounter for preprocedural cardiovascular examination: Secondary | ICD-10-CM

## 2023-05-03 DIAGNOSIS — S72001A Fracture of unspecified part of neck of right femur, initial encounter for closed fracture: Secondary | ICD-10-CM

## 2023-05-03 DIAGNOSIS — I5020 Unspecified systolic (congestive) heart failure: Secondary | ICD-10-CM | POA: Diagnosis not present

## 2023-05-03 DIAGNOSIS — N183 Chronic kidney disease, stage 3 unspecified: Secondary | ICD-10-CM | POA: Diagnosis not present

## 2023-05-03 DIAGNOSIS — I5023 Acute on chronic systolic (congestive) heart failure: Secondary | ICD-10-CM | POA: Diagnosis not present

## 2023-05-03 HISTORY — PX: TOTAL HIP ARTHROPLASTY: SHX124

## 2023-05-03 LAB — CBC
HCT: 35 % — ABNORMAL LOW (ref 36.0–46.0)
Hemoglobin: 10.9 g/dL — ABNORMAL LOW (ref 12.0–15.0)
MCH: 28.2 pg (ref 26.0–34.0)
MCHC: 31.1 g/dL (ref 30.0–36.0)
MCV: 90.4 fL (ref 80.0–100.0)
Platelets: 193 10*3/uL (ref 150–400)
RBC: 3.87 MIL/uL (ref 3.87–5.11)
RDW: 18.4 % — ABNORMAL HIGH (ref 11.5–15.5)
WBC: 12.2 10*3/uL — ABNORMAL HIGH (ref 4.0–10.5)
nRBC: 0 % (ref 0.0–0.2)

## 2023-05-03 LAB — TYPE AND SCREEN
ABO/RH(D): A POS
Antibody Screen: NEGATIVE

## 2023-05-03 LAB — COMPREHENSIVE METABOLIC PANEL
ALT: 15 U/L (ref 0–44)
AST: 16 U/L (ref 15–41)
Albumin: 2.7 g/dL — ABNORMAL LOW (ref 3.5–5.0)
Alkaline Phosphatase: 56 U/L (ref 38–126)
Anion gap: 12 (ref 5–15)
BUN: 51 mg/dL — ABNORMAL HIGH (ref 8–23)
CO2: 27 mmol/L (ref 22–32)
Calcium: 9 mg/dL (ref 8.9–10.3)
Chloride: 96 mmol/L — ABNORMAL LOW (ref 98–111)
Creatinine, Ser: 1.54 mg/dL — ABNORMAL HIGH (ref 0.44–1.00)
GFR, Estimated: 34 mL/min — ABNORMAL LOW (ref >60)
Glucose, Bld: 154 mg/dL — ABNORMAL HIGH (ref 70–99)
Potassium: 4.6 mmol/L (ref 3.5–5.1)
Sodium: 135 mmol/L (ref 135–145)
Total Bilirubin: 1 mg/dL (ref 0.3–1.2)
Total Protein: 6.1 g/dL — ABNORMAL LOW (ref 6.5–8.1)

## 2023-05-03 LAB — MAGNESIUM: Magnesium: 1.7 mg/dL (ref 1.7–2.4)

## 2023-05-03 LAB — GLUCOSE, CAPILLARY
Glucose-Capillary: 162 mg/dL — ABNORMAL HIGH (ref 70–99)
Glucose-Capillary: 144 mg/dL — ABNORMAL HIGH (ref 70–99)
Glucose-Capillary: 108 mg/dL — ABNORMAL HIGH (ref 70–99)
Glucose-Capillary: 169 mg/dL — ABNORMAL HIGH (ref 70–99)

## 2023-05-03 LAB — ABO/RH: ABO/RH(D): A POS

## 2023-05-03 SURGERY — ARTHROPLASTY, HIP, TOTAL,POSTERIOR APPROACH
Anesthesia: Spinal | Site: Hip | Laterality: Right

## 2023-05-03 MED ORDER — DEXAMETHASONE SODIUM PHOSPHATE 10 MG/ML IJ SOLN
INTRAMUSCULAR | Status: DC | PRN
  Administered 2023-05-03: 5 mg via INTRAVENOUS

## 2023-05-03 MED ORDER — ENOXAPARIN SODIUM 30 MG/0.3ML IJ SOSY
30.0000 mg | PREFILLED_SYRINGE | INTRAMUSCULAR | Status: DC
  Administered 2023-05-04 – 2023-05-09 (×6): 30 mg via SUBCUTANEOUS
  Filled 2023-05-03 (×6): qty 0.3

## 2023-05-03 MED ORDER — SODIUM CHLORIDE 0.9 % IR SOLN
Status: DC | PRN
  Administered 2023-05-03: 1000 mL

## 2023-05-03 MED ORDER — ACETAMINOPHEN 500 MG PO TABS
500.0000 mg | ORAL_TABLET | Freq: Four times a day (QID) | ORAL | Status: AC
  Administered 2023-05-03 – 2023-05-04 (×4): 500 mg via ORAL
  Filled 2023-05-03 (×3): qty 1

## 2023-05-03 MED ORDER — FENTANYL CITRATE (PF) 100 MCG/2ML IJ SOLN: 25.0000 ug | INTRAMUSCULAR | Status: DC | PRN

## 2023-05-03 MED ORDER — HYDROCODONE-ACETAMINOPHEN 5-325 MG PO TABS
1.0000 | ORAL_TABLET | ORAL | Status: DC | PRN
  Administered 2023-05-03 – 2023-05-05 (×3): 2 via ORAL
  Administered 2023-05-09: 1 via ORAL
  Filled 2023-05-03 (×3): qty 2
  Filled 2023-05-03: qty 1

## 2023-05-03 MED ORDER — ALBUMIN HUMAN 5 % IV SOLN: INTRAVENOUS | Status: DC | PRN

## 2023-05-03 MED ORDER — DOCUSATE SODIUM 100 MG PO CAPS
100.0000 mg | ORAL_CAPSULE | Freq: Two times a day (BID) | ORAL | Status: DC
  Administered 2023-05-03 – 2023-05-08 (×10): 100 mg via ORAL
  Filled 2023-05-03 (×12): qty 1

## 2023-05-03 MED ORDER — DROPERIDOL 2.5 MG/ML IJ SOLN: 0.6250 mg | Freq: Once | INTRAMUSCULAR | Status: DC | PRN

## 2023-05-03 MED ORDER — TRANEXAMIC ACID-NACL 1000-0.7 MG/100ML-% IV SOLN
INTRAVENOUS | Status: AC
  Filled 2023-05-03: qty 100

## 2023-05-03 MED ORDER — POTASSIUM CHLORIDE IN NACL 20-0.45 MEQ/L-% IV SOLN
INTRAVENOUS | Status: DC
  Filled 2023-05-03: qty 1000

## 2023-05-03 MED ORDER — CEFAZOLIN SODIUM-DEXTROSE 2-4 GM/100ML-% IV SOLN
2.0000 g | Freq: Four times a day (QID) | INTRAVENOUS | Status: AC
  Administered 2023-05-03 – 2023-05-04 (×2): 2 g via INTRAVENOUS
  Filled 2023-05-03 (×2): qty 100

## 2023-05-03 MED ORDER — ONDANSETRON HCL 4 MG/2ML IJ SOLN
INTRAMUSCULAR | Status: DC | PRN
  Administered 2023-05-03: 4 mg via INTRAVENOUS

## 2023-05-03 MED ORDER — TRANEXAMIC ACID-NACL 1000-0.7 MG/100ML-% IV SOLN
1000.0000 mg | Freq: Once | INTRAVENOUS | Status: AC
  Administered 2023-05-03: 1000 mg via INTRAVENOUS
  Filled 2023-05-03: qty 100

## 2023-05-03 MED ORDER — LACTATED RINGERS IV SOLN: INTRAVENOUS | Status: DC

## 2023-05-03 MED ORDER — PROPOFOL 10 MG/ML IV BOLUS
INTRAVENOUS | Status: DC | PRN
Start: 2023-05-03 — End: 2023-05-03
  Administered 2023-05-03: 50 ug/kg/min via INTRAVENOUS
  Administered 2023-05-03: 10 mg via INTRAVENOUS

## 2023-05-03 MED ORDER — PHENYLEPHRINE HCL-NACL 20-0.9 MG/250ML-% IV SOLN
INTRAVENOUS | Status: DC | PRN
  Administered 2023-05-03: 15 ug/min via INTRAVENOUS

## 2023-05-03 MED ORDER — PHENOL 1.4 % MT LIQD: 1.0000 | OROMUCOSAL | Status: DC | PRN

## 2023-05-03 MED ORDER — BUPIVACAINE HCL (PF) 0.5 % IJ SOLN
INTRAMUSCULAR | Status: DC | PRN
Start: 2023-05-03 — End: 2023-05-03
  Administered 2023-05-03: 10 mL

## 2023-05-03 MED ORDER — ONDANSETRON HCL 4 MG/2ML IJ SOLN
INTRAMUSCULAR | Status: AC
  Filled 2023-05-03: qty 2

## 2023-05-03 MED ORDER — FENTANYL CITRATE (PF) 250 MCG/5ML IJ SOLN
INTRAMUSCULAR | Status: DC | PRN
  Administered 2023-05-03: 100 ug via INTRAVENOUS

## 2023-05-03 MED ORDER — BUPIVACAINE HCL (PF) 0.5 % IJ SOLN
INTRAMUSCULAR | Status: AC
  Filled 2023-05-03: qty 30

## 2023-05-03 MED ORDER — FENTANYL CITRATE (PF) 250 MCG/5ML IJ SOLN
INTRAMUSCULAR | Status: AC
  Filled 2023-05-03: qty 5

## 2023-05-03 MED ORDER — POVIDONE-IODINE 10 % EX SWAB: 2.0000 | Freq: Once | CUTANEOUS | Status: DC

## 2023-05-03 MED ORDER — MENTHOL 3 MG MT LOZG: 1.0000 | LOZENGE | OROMUCOSAL | Status: DC | PRN

## 2023-05-03 MED ORDER — CHLORHEXIDINE GLUCONATE 4 % EX SOLN
60.0000 mL | Freq: Once | CUTANEOUS | Status: DC
  Administered 2023-05-03: 4 via TOPICAL
  Filled 2023-05-03: qty 60

## 2023-05-03 MED ORDER — ONDANSETRON HCL 4 MG PO TABS: 4.0000 mg | ORAL_TABLET | Freq: Four times a day (QID) | ORAL | Status: DC | PRN

## 2023-05-03 MED ORDER — HYDROCODONE-ACETAMINOPHEN 7.5-325 MG PO TABS
1.0000 | ORAL_TABLET | ORAL | Status: DC | PRN
  Administered 2023-05-04: 2 via ORAL
  Administered 2023-05-04: 1 via ORAL
  Administered 2023-05-05 – 2023-05-08 (×14): 2 via ORAL
  Administered 2023-05-09: 1 via ORAL
  Administered 2023-05-09: 2 via ORAL
  Filled 2023-05-03 (×4): qty 2
  Filled 2023-05-03: qty 1
  Filled 2023-05-03 (×5): qty 2
  Filled 2023-05-03: qty 1
  Filled 2023-05-03 (×7): qty 2

## 2023-05-03 MED ORDER — CHLORHEXIDINE GLUCONATE 0.12 % MT SOLN
OROMUCOSAL | Status: AC
  Administered 2023-05-03: 15 mL via OROMUCOSAL
  Filled 2023-05-03: qty 15

## 2023-05-03 MED ORDER — SENNA 8.6 MG PO TABS
1.0000 | ORAL_TABLET | Freq: Two times a day (BID) | ORAL | Status: DC
  Administered 2023-05-03 – 2023-05-08 (×10): 8.6 mg via ORAL
  Filled 2023-05-03 (×12): qty 1

## 2023-05-03 MED ORDER — BISACODYL 10 MG RE SUPP: 10.0000 mg | Freq: Every day | RECTAL | Status: DC | PRN

## 2023-05-03 MED ORDER — DEXAMETHASONE SODIUM PHOSPHATE 10 MG/ML IJ SOLN
INTRAMUSCULAR | Status: AC
  Filled 2023-05-03: qty 1

## 2023-05-03 MED ORDER — CHLORHEXIDINE GLUCONATE 0.12 % MT SOLN: 15.0000 mL | Freq: Once | OROMUCOSAL | Status: AC

## 2023-05-03 MED ORDER — ONDANSETRON HCL 4 MG/2ML IJ SOLN: 4.0000 mg | Freq: Four times a day (QID) | INTRAMUSCULAR | Status: DC | PRN

## 2023-05-03 MED ORDER — EPHEDRINE SULFATE-NACL 50-0.9 MG/10ML-% IV SOSY
PREFILLED_SYRINGE | INTRAVENOUS | Status: DC | PRN
  Administered 2023-05-03: 5 mg via INTRAVENOUS

## 2023-05-03 MED ORDER — FERROUS SULFATE 325 (65 FE) MG PO TABS
325.0000 mg | ORAL_TABLET | Freq: Three times a day (TID) | ORAL | Status: DC
  Administered 2023-05-03 – 2023-05-09 (×17): 325 mg via ORAL
  Filled 2023-05-03 (×17): qty 1

## 2023-05-03 MED ORDER — LIDOCAINE 2% (20 MG/ML) 5 ML SYRINGE
INTRAMUSCULAR | Status: AC
  Filled 2023-05-03: qty 5

## 2023-05-03 MED ORDER — BUPIVACAINE HCL (PF) 0.5 % IJ SOLN
INTRAMUSCULAR | Status: AC
  Filled 2023-05-03: qty 10

## 2023-05-03 MED ORDER — PHENYLEPHRINE 80 MCG/ML (10ML) SYRINGE FOR IV PUSH (FOR BLOOD PRESSURE SUPPORT)
PREFILLED_SYRINGE | INTRAVENOUS | Status: DC | PRN
  Administered 2023-05-03 (×3): 80 ug via INTRAVENOUS
  Administered 2023-05-03: 160 ug via INTRAVENOUS

## 2023-05-03 MED ORDER — MAGNESIUM CITRATE PO SOLN: 1.0000 | Freq: Once | ORAL | Status: DC | PRN

## 2023-05-03 MED ORDER — CEFAZOLIN SODIUM-DEXTROSE 2-4 GM/100ML-% IV SOLN
INTRAVENOUS | Status: AC
  Filled 2023-05-03: qty 100

## 2023-05-03 MED ORDER — TRANEXAMIC ACID-NACL 1000-0.7 MG/100ML-% IV SOLN
1000.0000 mg | INTRAVENOUS | Status: AC
  Administered 2023-05-03: 1000 mg via INTRAVENOUS
  Filled 2023-05-03: qty 100

## 2023-05-03 MED ORDER — PROPOFOL 10 MG/ML IV BOLUS
INTRAVENOUS | Status: AC
  Filled 2023-05-03: qty 20

## 2023-05-03 MED ORDER — POLYETHYLENE GLYCOL 3350 17 G PO PACK: 17.0000 g | PACK | Freq: Every day | ORAL | Status: DC | PRN

## 2023-05-03 MED ORDER — CEFAZOLIN SODIUM-DEXTROSE 2-4 GM/100ML-% IV SOLN
2.0000 g | INTRAVENOUS | Status: AC
  Administered 2023-05-03: 2 g via INTRAVENOUS
  Filled 2023-05-03: qty 100

## 2023-05-03 MED ORDER — ALUM & MAG HYDROXIDE-SIMETH 200-200-20 MG/5ML PO SUSP: 30.0000 mL | ORAL | Status: DC | PRN

## 2023-05-03 MED ORDER — MORPHINE SULFATE (PF) 2 MG/ML IV SOLN
0.5000 mg | INTRAVENOUS | Status: DC | PRN
  Administered 2023-05-03 – 2023-05-04 (×4): 1 mg via INTRAVENOUS
  Filled 2023-05-03 (×4): qty 1

## 2023-05-03 MED ORDER — ORAL CARE MOUTH RINSE: 15.0000 mL | Freq: Once | OROMUCOSAL | Status: AC

## 2023-05-03 MED ORDER — EPHEDRINE 5 MG/ML INJ
INTRAVENOUS | Status: AC
  Filled 2023-05-03: qty 5

## 2023-05-03 MED ORDER — ACETAMINOPHEN 325 MG PO TABS
325.0000 mg | ORAL_TABLET | Freq: Four times a day (QID) | ORAL | Status: DC | PRN
  Administered 2023-05-05 – 2023-05-07 (×2): 650 mg via ORAL
  Filled 2023-05-03 (×2): qty 2

## 2023-05-03 MED ORDER — BUPIVACAINE HCL (PF) 0.75 % IJ SOLN
INTRAMUSCULAR | Status: DC | PRN
Start: 2023-05-03 — End: 2023-05-03
  Administered 2023-05-03: 1.8 mL

## 2023-05-03 SURGICAL SUPPLY — 69 items
BAG COUNTER SPONGE SURGICOUNT (BAG) ×1 IMPLANT
BAG SPNG CNTER NS LX DISP (BAG) ×1
BIT DRILL 5/64X5 DISP (BIT) ×1 IMPLANT
BLADE SAW SGTL 73X25 THK (BLADE) ×1 IMPLANT
CLSR STERI-STRIP ANTIMIC 1/2X4 (GAUZE/BANDAGES/DRESSINGS) ×2 IMPLANT
COVER SURGICAL LIGHT HANDLE (MISCELLANEOUS) ×1 IMPLANT
DRAPE INCISE IOBAN 66X45 STRL (DRAPES) IMPLANT
DRAPE ORTHO SPLIT 77X108 STRL (DRAPES) ×4
DRAPE SURG ORHT 6 SPLT 77X108 (DRAPES) ×2 IMPLANT
DRAPE U-SHAPE 47X51 STRL (DRAPES) ×1 IMPLANT
DRSG MEPILEX POST OP 4X12 (GAUZE/BANDAGES/DRESSINGS) IMPLANT
DRSG MEPILEX POST OP 4X8 (GAUZE/BANDAGES/DRESSINGS) IMPLANT
DURAPREP 26ML APPLICATOR (WOUND CARE) ×1 IMPLANT
ELECT CAUTERY BLADE 6.4 (BLADE) ×1 IMPLANT
ELECT REM PT RETURN 9FT ADLT (ELECTROSURGICAL) ×1
ELECTRODE REM PT RTRN 9FT ADLT (ELECTROSURGICAL) ×1 IMPLANT
GLOVE BIO SURGEON STRL SZ7 (GLOVE) ×2 IMPLANT
GLOVE BIO SURGEON STRL SZ7.5 (GLOVE) IMPLANT
GLOVE BIOGEL PI IND STRL 6.5 (GLOVE) IMPLANT
GLOVE BIOGEL PI IND STRL 7.0 (GLOVE) ×1 IMPLANT
GLOVE BIOGEL PI IND STRL 7.5 (GLOVE) IMPLANT
GLOVE BIOGEL PI IND STRL 8 (GLOVE) IMPLANT
GLOVE ORTHO TXT STRL SZ7.5 (GLOVE) ×1 IMPLANT
GLOVE SURG ORTHO 7.0 STRL STRW (GLOVE) IMPLANT
GLOVE SURG SS PI 6.5 STRL IVOR (GLOVE) IMPLANT
GOWN STRL REUS W/ TWL LRG LVL3 (GOWN DISPOSABLE) ×1 IMPLANT
GOWN STRL REUS W/ TWL XL LVL3 (GOWN DISPOSABLE) ×1 IMPLANT
GOWN STRL REUS W/TWL LRG LVL3 (GOWN DISPOSABLE) ×3
GOWN STRL REUS W/TWL XL LVL3 (GOWN DISPOSABLE)
HEAD FEM UNIPOLAR 47 OD STRL (Hips) IMPLANT
HOOD PEEL AWAY T7 (MISCELLANEOUS) ×1 IMPLANT
HOOD W/PEELAWAY (MISCELLANEOUS) ×1 IMPLANT
KIT BASIN OR (CUSTOM PROCEDURE TRAY) ×1 IMPLANT
KIT TURNOVER KIT B (KITS) ×1 IMPLANT
MANIFOLD NEPTUNE II (INSTRUMENTS) ×1 IMPLANT
NDL 18GX1X1/2 (RX/OR ONLY) (NEEDLE) ×1 IMPLANT
NDL HYPO 25GX1X1/2 BEV (NEEDLE) IMPLANT
NDL KEITH (NEEDLE) IMPLANT
NEEDLE 18GX1X1/2 (RX/OR ONLY) (NEEDLE) ×1 IMPLANT
NEEDLE HYPO 25GX1X1/2 BEV (NEEDLE) ×1 IMPLANT
NEEDLE KEITH (NEEDLE) ×1 IMPLANT
NS IRRIG 1000ML POUR BTL (IV SOLUTION) ×1 IMPLANT
PACK TOTAL JOINT (CUSTOM PROCEDURE TRAY) ×1 IMPLANT
PAD ARMBOARD 7.5X6 YLW CONV (MISCELLANEOUS) ×2 IMPLANT
PRESSURIZER FEMORAL UNIV (MISCELLANEOUS) IMPLANT
RETRIEVER SUT HEWSON (MISCELLANEOUS) ×1 IMPLANT
SPACER DEPUY (Hips) IMPLANT
STEM SUMMIT PRESSFIT SZ 6 (Hips) IMPLANT
SUCTION TUBE FRAZIER 10FR DISP (SUCTIONS) ×1 IMPLANT
SUCTION TUBE FRAZIER 12FR DISP (SUCTIONS) IMPLANT
SUT ETHIBOND 2 V 37 (SUTURE) IMPLANT
SUT FIBERWIRE #2 38 REV NDL BL (SUTURE) ×2
SUT VIC AB 0 CT1 27 (SUTURE) ×1
SUT VIC AB 0 CT1 27XBRD ANBCTR (SUTURE) ×1 IMPLANT
SUT VIC AB 1 CT1 27 (SUTURE) ×3
SUT VIC AB 1 CT1 27XBRD ANTBC (SUTURE) IMPLANT
SUT VIC AB 2-0 CT1 27 (SUTURE) ×2
SUT VIC AB 2-0 CT1 TAPERPNT 27 (SUTURE) ×1 IMPLANT
SUT VIC AB 3-0 SH 27 (SUTURE) ×2
SUT VIC AB 3-0 SH 27X BRD (SUTURE) IMPLANT
SUT VIC AB 3-0 SH 27XBRD (SUTURE) IMPLANT
SUT VIC AB 3-0 SH 8-18 (SUTURE) ×1 IMPLANT
SUTURE FIBERWR#2 38 REV NDL BL (SUTURE) ×3 IMPLANT
SYR CONTROL 10ML LL (SYRINGE) ×1 IMPLANT
TOWEL GREEN STERILE (TOWEL DISPOSABLE) ×1 IMPLANT
TOWEL GREEN STERILE FF (TOWEL DISPOSABLE) ×1 IMPLANT
TRAY CATH INTERMITTENT SS 16FR (CATHETERS) IMPLANT
TRAY FOLEY W/BAG SLVR 14FR (SET/KITS/TRAYS/PACK) IMPLANT
WATER STERILE IRR 1000ML POUR (IV SOLUTION) IMPLANT

## 2023-05-03 NOTE — Anesthesia Preprocedure Evaluation (Addendum)
Anesthesia Evaluation  Patient identified by MRN, date of birth, ID band Patient awake    Reviewed: Allergy & Precautions, H&P , NPO status , Patient's Chart, lab work & pertinent test results  Airway Mallampati: II  TM Distance: >3 FB Neck ROM: Full    Dental no notable dental hx.    Pulmonary COPD, former smoker   Pulmonary exam normal breath sounds clear to auscultation       Cardiovascular hypertension, + CAD, + Past MI and +CHF  Normal cardiovascular exam Rhythm:Regular Rate:Normal     Neuro/Psych  PSYCHIATRIC DISORDERS Anxiety     CVA    GI/Hepatic negative GI ROS, Neg liver ROS,,,  Endo/Other  diabetes    Renal/GU CRFRenal disease  negative genitourinary   Musculoskeletal  (+) Arthritis ,    Abdominal   Peds negative pediatric ROS (+)  Hematology  (+) Blood dyscrasia, anemia   Anesthesia Other Findings   Reproductive/Obstetrics negative OB ROS                             Anesthesia Physical Anesthesia Plan  ASA: 4  Anesthesia Plan: Spinal   Post-op Pain Management:    Induction: Intravenous  PONV Risk Score and Plan: Ondansetron, Treatment may vary due to age or medical condition and Propofol infusion  Airway Management Planned:   Additional Equipment:   Intra-op Plan:   Post-operative Plan: Extubation in OR  Informed Consent: I have reviewed the patients History and Physical, chart, labs and discussed the procedure including the risks, benefits and alternatives for the proposed anesthesia with the patient or authorized representative who has indicated his/her understanding and acceptance.    Suspend DNR and Discussed DNR with patient.   Dental advisory given  Plan Discussed with: CRNA  Anesthesia Plan Comments: (82 year old with history of systolic CHF EF 20-25%, CAD status post PCI, COPD on 2-3 L nasal cannula, CKD stage IIIa, CVA, HLD, HTN, DM2 comes to the  [facility] ED for evaluation of fall while using her walker and sustaining right hip fracture and was also noted to be in CHF exacerbation therefore transferred to [facility] for further orthopedic and cardiology care as well.  She was recently admitted at [facility] from 8/16 - 8/19 for NSTEMI and CHF exacerbation treated with diuretics and eventually discharged.  Admits of medication compliance.  Upon admission, cardiology was consulted.  Patient's respirations are slowly improving with aggressive diuretics.  DNR discussed at length with Patient and patient's son. Both agree to rescind the DNR perioperatively. )        Anesthesia Quick Evaluation

## 2023-05-03 NOTE — Transfer of Care (Signed)
Immediate Anesthesia Transfer of Care Note  Patient: Angela Lucero  Procedure(s) Performed: POSTERIOR HEMI HIP (Right: Hip)  Patient Location: PACU  Anesthesia Type:Spinal  Level of Consciousness: awake, alert , and oriented  Airway & Oxygen Therapy: Patient Spontanous Breathing and Patient connected to face mask oxygen  Post-op Assessment: Report given to RN and Post -op Vital signs reviewed and stable  Post vital signs: Reviewed and stable  Last Vitals:  Vitals Value Taken Time  BP 93/55 05/03/23 1530  Temp    Pulse 81 05/03/23 1533  Resp 17 05/03/23 1533  SpO2 100 % 05/03/23 1533  Vitals shown include unfiled device data.  Last Pain:  Vitals:   05/03/23 0800  TempSrc:   PainSc: 9          Complications: No notable events documented.

## 2023-05-03 NOTE — Op Note (Signed)
05/01/2023 - 05/03/2023  3:16 PM  PATIENT:  Angela Lucero   MRN: [id number]  PRE-OPERATIVE DIAGNOSIS: Displaced right femoral neck fracture  POST-OPERATIVE DIAGNOSIS: Same  PROCEDURE:  Procedure(s): POSTERIOR HEMI HIP  PREOPERATIVE INDICATIONS:  Svetlana Illa is an 82 y.o. female who was admitted 05/01/2023 with a diagnosis of Closed right hip fracture, initial encounter [facility]) and elected for surgical management.  The risks benefits and alternatives were discussed with the patient including but not limited to the risks of nonoperative treatment, versus surgical intervention including infection, bleeding, nerve injury, periprosthetic fracture, the need for revision surgery, dislocation, leg length discrepancy, blood clots, cardiopulmonary complications, morbidity, mortality, among others, and they were willing to proceed.   OPERATIVE REPORT     SURGEON:  Teryl Lucy, MD    ASSISTANT:  Roswell Nickel, PA-C,   (Present throughout the entire procedure,  necessary for completion of procedure in a timely manner, assisting with retraction, instrumentation, and closure)     ANESTHESIA: Spinal  ESTIMATED BLOOD LOSS: 200 mL    COMPLICATIONS:  None.   UNIQUE ASPECTS OF THE CASE: The size 6 was still just a little bit rotationally unstable, but I definitely had to work to get it down, and so I was prepared to cement if necessary, but the final implant was extremely tight, and in fact did not even fully seat, leaving just 1 mm above the calcar.     COMPONENTS:    Implant Name Type Inv. Item Serial No. Manufacturer Lot No. LRB No. Used Action  STEM SUMMIT PRESSFIT SZ 6 - [id number] Hips STEM SUMMIT PRESSFIT SZ 6  [employer]PU[employer]TH[employer] [id number] Right 1 Implanted  SPACER DEPUY - [id number] Hips SPACER DEPUY  DEPUY ORTH[employer] [id number] Right 1 Implanted  HEAD FEM UNIPOLAR 47 OD STRL - [id number] Hips HEAD FEM UNIPOLAR 47 OD STRL  DEPUY ORTH[employer] [id number] Right 1 Implanted      PROCEDURE IN DETAIL: The  patient was met in the holding area and identified.  The appropriate hip  was marked at the operative site. The patient was then transported to the OR and  placed under anesthesia.  At that point, the patient was  placed in the lateral decubitus position with the operative side up and  secured to the operating room table and all bony prominences padded.     The operative lower extremity was prepped from the iliac crest to the toes.  Sterile draping was performed.  Time out was performed prior to incision.      A routine posterolateral approach was utilized via sharp dissection  carried down to the subcutaneous tissue.  Gross bleeders were Bovie  coagulated.  The iliotibial band was identified and incised  along the length of the skin incision.  Self-retaining retractors were  inserted.  With the hip internally rotated, the short external rotators  were identified. The piriformis was tagged with Ethibond, and the hip capsule released in a T-type fashion.  The femoral neck was exposed, and I resected the femoral neck using the appropriate jig. This was performed at approximately a thumb's breadth above the lesser trochanter.    I then exposed the deep acetabulum, cleared out any tissue including the ligamentum teres.    I then prepared the proximal femur using the cookie-cutter, the lateralizing reamer, and then sequentially broached.  A trial utilized, and I reduced the hip and it was found to have excellent stability with functional range of motion. The trial components were then removed.   I then  place the real implant and I impacted the real head ball into place. The hip was then reduced and taken through functional range of motion and found to have excellent stability. Leg lengths were restored.  I then used a 2 mm drill bits to pass the Ethibond suture from the capsule and piriformis through the greater trochanter, and secured this. Excellent posterior capsular repair was achieved. I also closed  the T in the capsule.  I then irrigated the hip copiously again with pulse lavage, and repaired the fascia with Vicryl, followed by Vicryl for the subcutaneous tissue, Monocryl for the skin, Steri-Strips and sterile gauze. The wounds were injected. The patient was then awakened and returned to PACU in stable and satisfactory condition. There were no complications.  Teryl Lucy, MD Orthopedic Surgeon [phone]   05/03/2023 3:16 PM

## 2023-05-03 NOTE — Progress Notes (Signed)
Rounding Note    Patient Name: Angela Lucero Date of Encounter: 05/03/2023  [facility] Cardiologist: Gypsy Balsam, MD   Subjective   Breathing improved.    Inpatient Medications    Scheduled Meds:  aspirin EC  81 mg Oral Daily   atorvastatin  80 mg Oral Daily   busPIRone  10 mg Oral TID   chlorhexidine  60 mL Topical Once   Chlorhexidine Gluconate Cloth  6 each Topical Q0600   furosemide  80 mg Intravenous BID   insulin aspart  0-9 Units Subcutaneous TID WC   melatonin  5 mg Oral QHS   metoprolol succinate  12.5 mg Oral Daily   mupirocin ointment  1 Application Nasal BID   pantoprazole  40 mg Oral Daily   povidone-iodine  2 Application Topical Once   sodium chloride flush  3 mL Intravenous Q12H   umeclidinium-vilanterol  1 puff Inhalation Daily   Continuous Infusions:  sodium chloride      ceFAZolin (ANCEF) IV     tranexamic acid     PRN Meds: sodium chloride, acetaminophen **OR** acetaminophen, clonazePAM, dextrose, hydrALAZINE, levalbuterol, morphine injection, nitroGLYCERIN, ondansetron **OR** ondansetron (ZOFRAN) IV, oxyCODONE-acetaminophen, polyethylene glycol, senna-docusate, sodium chloride flush   Vital Signs    Vitals:   05/02/23 1554 05/02/23 1932 05/03/23 0345 05/03/23 0730  BP: 113/65 121/67 113/62 118/74  Pulse: 94 97 (!) 104 (!) 106  Resp: 17 20 20    Temp: 98.2 F (36.8 C) 98 F (36.7 C) 98.2 F (36.8 C) 98.1 F (36.7 C)  TempSrc: Oral Oral Oral Oral  SpO2: 98% 98% 99% 99%  Weight:        Intake/Output Summary (Last 24 hours) at 05/03/2023 0821 Last data filed at 05/03/2023 1610 Gross per 24 hour  Intake 237 ml  Output 1600 ml  Net -1363 ml      05/02/2023    7:24 AM 05/01/2023    5:00 AM 04/25/2023    3:40 PM  Last 3 Weights  Weight (lbs) 182 lb 1.6 oz 180 lb 8.9 oz 180 lb 8 oz  Weight (kg) 82.6 kg 81.9 kg 81.874 kg      Telemetry    Sinus tach - Personally Reviewed  ECG    Sinus tach, IVCD - Personally  Reviewed  Physical Exam   GEN: No acute distress.  Frail Neck: No JVD Cardiac: RRR, no murmurs, rubs, or gallops.  Respiratory: crackles at bases to auscultation bilaterally. GI: Soft, nontender, non-distended  MS: No edema; No deformity. Neuro:  Nonfocal  Psych: Normal affect   Labs    High Sensitivity Troponin:  No results for input(s): "TROPONINIHS" in the last 720 hours.   Chemistry Recent Labs  Lab 05/01/23 0636 05/02/23 0309 05/03/23 0318  NA 136 136 135  K 4.2 4.2 4.6  CL 96* 99 96*  CO2 30 29 27   GLUCOSE 147* 148* 154*  BUN 49* 53* 51*  CREATININE 1.66* 1.75* 1.54*  CALCIUM 8.9 8.6* 9.0  MG  --  1.7 1.7  PROT 6.0* 5.8* 6.1*  ALBUMIN 2.9* 2.7* 2.7*  AST 24 21 16   ALT 18 17 15   ALKPHOS 58 58 56  BILITOT 0.9 0.9 1.0  GFRNONAA 31* 29* 34*  ANIONGAP 10 8 12     Lipids No results for input(s): "CHOL", "TRIG", "HDL", "LABVLDL", "LDLCALC", "CHOLHDL" in the last 168 hours.  Hematology Recent Labs  Lab 05/01/23 0636 05/02/23 0309 05/03/23 0318  WBC 8.9 9.4 12.2*  RBC 3.87 3.85*  3.87  HGB 11.0* 10.5* 10.9*  HCT 35.9* 35.2* 35.0*  MCV 92.8 91.4 90.4  MCH 28.4 27.3 28.2  MCHC 30.6 29.8* 31.1  RDW 18.2* 18.1* 18.4*  PLT 174 167 193   Thyroid  Recent Labs  Lab 05/01/23 0636 05/02/23 0309  TSH 6.532*  --   FREET4  --  0.68    BNP Recent Labs  Lab 05/01/23 0636  BNP 2,534.0*    DDimer No results for input(s): "DDIMER" in the last 168 hours.   Radiology    ECHOCARDIOGRAM LIMITED  Result Date: 05/01/2023    ECHOCARDIOGRAM LIMITED REPORT   Patient Name:   Angela Lucero  Date of Exam: 05/01/2023 Medical Rec #:  [id number]  Height:       66.0 in Accession #:    [id number] Weight:       180.6 lb Date of Birth:  1941/03/26  BSA:          1.915 m Patient Age:    81 years   BP:           92/58 mmHg Patient Gender: F          HR:           101 bpm. Exam Location:  Inpatient Procedure: Limited Echo and Intracardiac Opacification Agent Indications:    CHF-Acute Systolic  I50.21  History:        Patient has prior history of Echocardiogram examinations, most                 recent 04/01/2023. CHF, COPD and Stroke; Risk                 Factors:Hypertension, Diabetes and Dyslipidemia.  Sonographer:    Celesta Gentile RCS Referring Phys: [id number] SUBRINA SUNDIL IMPRESSIONS  1. Anterior wall is very thin c/w scar. Left ventricular ejection fraction, by estimation, is 20 to 25%. The left ventricle has severely decreased function. The left ventricular internal cavity size was severely dilated.  2. Right ventricular systolic function is normal. The right ventricular size is mildly enlarged.  3. The mitral valve is normal in structure.  4. The aortic valve is normal in structure.  5. The inferior vena cava is dilated in size with >50% respiratory variability, suggesting right atrial pressure of 8 mmHg. Conclusion(s)/Recommendation(s): Limited study. No detailed valve assessment. Otherwise no significant changes from prior study. FINDINGS  Left Ventricle: Anterior wall is very thin c/w scar. Left ventricular ejection fraction, by estimation, is 20 to 25%. The left ventricle has severely decreased function. Definity contrast agent was given IV to delineate the left ventricular endocardial borders. The left ventricular internal cavity size was severely dilated. Right Ventricle: The right ventricular size is mildly enlarged. Right ventricular systolic function is normal. Pericardium: There is no evidence of pericardial effusion. Mitral Valve: The mitral valve is normal in structure. Aortic Valve: The aortic valve is normal in structure. Venous: The inferior vena cava is dilated in size with greater than 50% respiratory variability, suggesting right atrial pressure of 8 mmHg. LEFT VENTRICLE PLAX 2D LVIDd:         6.80 cm LVIDs:         6.00 cm LV PW:         1.20 cm LV IVS:        0.70 cm LVOT diam:     1.90 cm LVOT Area:     2.84 cm  LV Volumes (MOD) LV vol d, MOD A2C: 157.0 ml LV vol  d, MOD A4C:  190.0 ml LV vol s, MOD A2C: 138.0 ml LV vol s, MOD A4C: 132.0 ml LV SV MOD A2C:     19.0 ml LV SV MOD A4C:     190.0 ml LV SV MOD BP:      40.9 ml RIGHT VENTRICLE TAPSE (M-mode): 2.4 cm LEFT ATRIUM         Index LA diam:    4.10 cm 2.14 cm/m   AORTA Ao Root diam: 3.60 cm  SHUNTS Systemic Diam: 1.90 cm Carolan Clines Electronically signed by Carolan Clines Signature Date/Time: 05/01/2023/4:33:23 PM    Final    DG Knee 2 Views Right  Result Date: 05/01/2023 CLINICAL DATA:  Right hip fracture. EXAM: RIGHT KNEE - 3 VIEW COMPARISON:  None Available. FINDINGS: There is diffuse decreased bone mineralization. Moderate lateral and mild medial compartment joint space narrowing and peripheral osteophytosis. Mild patellofemoral joint space narrowing and superior osteophytosis. Small joint effusion. Minimal chronic enthesopathic change at the quadriceps insertion on the patella. High-grade atherosclerotic calcifications. No acute fracture or dislocation. IMPRESSION: 1. 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 2. Small joint effusion. Electronically Signed   By: Neita Garnet M.D.   On: 05/01/2023 11:19   DG HIP UNILAT WITH PELVIS 2-3 VIEWS RIGHT  Result Date: 05/01/2023 CLINICAL DATA:  Right hip fracture. EXAM: DG HIP (WITH OR WITHOUT PELVIS) 2-3V RIGHT COMPARISON:  Pelvis and right hip radiographs 04/30/2023 at 4:26 a.m. FINDINGS: There is diffuse decreased bone mineralization. Redemonstration of acute proximal femoral fracture with approximately 1.9 cm superior displacement of the distal fracture component with respect to the proximal fracture component. The right femoral head remains appropriately located with respect to the right acetabulum. Mild bilateral femoroacetabular joint space narrowing. Moderate to high-grade atherosclerotic calcifications. IMPRESSION: Redemonstration of acute proximal femoral fracture with approximately 1.9 cm superior displacement of the distal fracture  component with respect to the proximal fracture component. Electronically Signed   By: Neita Garnet M.D.   On: 05/01/2023 10:47   DG Chest Port 1 View  Result Date: 05/01/2023 CLINICAL DATA:  Dyspnea.  Right hip fracture EXAM: PORTABLE CHEST 1 VIEW COMPARISON:  04/30/2023 from [facility] FINDINGS: Numerous leads and wires project over the chest. Patient rotated left. Moderate cardiomegaly. Atherosclerosis in the transverse aorta. Possible small left pleural effusion versus artifact secondary to overlying breast tissue. Similar. No pneumothorax. Mild pulmonary venous congestion, likely superimposed upon chronic interstitial thickening. No lobar consolidation. IMPRESSION: Similar appearance of cardiomegaly and pulmonary venous congestion. Possible small left pleural effusion versus artifact from overlying breast tissue. Aortic Atherosclerosis (ICD10-I70.0). Electronically Signed   By: Jeronimo Greaves M.D.   On: 05/01/2023 10:38    Cardiac Studies   Low EF by most recent echocardiogram.  CXR shows densitiesat both bases; more likely edema  Patient Profile     82 y.o. female with hip fracture  Assessment & Plan    Acute on chronic systolic heart failure: LVEF 25%.  She also needs hip surgery.  She has been diuresed with IV Lasix.  Creatinine improved today.  I's and O's show that she was out -1300 yesterday.  She appears stable on O2 [place] 2-3L.  OK to proceed with surgery today from a cardiac standpoint.  She is still higher risk given low EF and mitral regurgitation but is likely at her baseline.  I spoke to the son and he is aware and also understands the risks of surgery.  I think the risks of  not having surgery are higher.  Patient is in agreement.      For questions or updates, please contact [facility] Please consult [email or web address] for contact info under        Signed, Lance Muss, MD  05/03/2023, 8:21 AM

## 2023-05-03 NOTE — Progress Notes (Signed)
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  She was recently admitted at [facility] from 8/16 - 8/19 for NSTEMI and CHF exacerbation treated with diuretics and eventually discharged.  Admits of medication compliance.  Upon admission, cardiology was consulted.  Patient's respirations are slowly improving with aggressive diuretics.     Assessment & Plan:  Principal Problem:   Closed right hip fracture, initial encounter [facility]) Active Problems:   Displaced fracture of right femoral neck (HCC)   Acute exacerbation of CHF (congestive heart failure) (HCC)   COPD (chronic obstructive pulmonary disease) (HCC)   GAD (generalized anxiety disorder)   Hyperlipidemia   History of CAD (coronary artery disease)   Chronic hypoxic respiratory failure (HCC)   Chronic kidney disease (CKD), stage III (moderate) (HCC)     Right-sided femoral neck fracture; Closed This was after mechanical fall.  Orthopedic consulted, planning on ORIF once she is medically more stabilized.  Currently being managed from cardiac standpoint but regardless she will remain high risk.  Patient and family has been very clear that they would like to proceed with surgery and understands the high risk.   Acute on chronic hypoxic respiratory distress Acute congestive heart failure with reduced EF, 20%, class III Patient's recent echocardiogram has shown EF of 20-25%.  Volume status is improved.  Tells me her breathing is better, heart rate improved.  Will continue IV Lasix and  Toprol.  Entresto on hold.  Cardiology cleared her for surgery but she still remains at high risk.  Tachycardia/tachypnea - Slowly proving with diuretics   Chronic hypoxic respiratory failure History of COPD Continue supplemental oxygen.  As needed bronchodilators.  Also continue home Anoro   CKD stage 3B Creatinine is currently stable around baseline of 1.6     History of CAD Currently chest pain-free.  Recent NSTEMI 2 weeks ago.  Currently on cardiac medications.  Further adjustments per cardiology team   Generalized anxiety disorder -Continue BuSpar 10 mg 3 times daily and Klonopin 0.5 mg twice daily as needed.     Hyperlipidemia -Continue Lipitor   DM type II Currently p.o. meds on hold.  Sliding scale and Accu-Cheks   DVT prophylaxis:  SQ Heparin Code Status: DNR Diet:Cardiac diet Family Communication: Son at bedside Consults: Orthopedic surgeon & Cardiology.  Continue IV diuretics.  Plans for ORIF today     Subjective: Tells me her breathing is much better today   Examination:  General exam: Appears calm and comfortable, 3 L nasal cannula Respiratory system: Some bibasilar crackles Cardiovascular system: S1 & S2 heard, RRR. No JVD, murmurs, rubs, gallops or clicks.  1+ bilateral lower extremity pitting edema Gastrointestinal system: Abdomen is nondistended, soft and nontender. No organomegaly or masses felt. Normal bowel sounds heard. Central nervous system: Alert and oriented. No focal neurological deficits. Extremities: Symmetric 5 x 5 power. Skin: No rashes, lesions or ulcers Psychiatry: Judgement and insight appear normal. Mood & affect appropriate.       Diet Orders (From admission, onward)  Start     Ordered   05/02/23 0830  Diet Heart Room service appropriate? Yes; Fluid consistency: Thin; Fluid restriction: 1500 mL Fluid  Diet effective now       Question Answer Comment  Room service appropriate? Yes   Fluid consistency: Thin   Fluid  restriction: 1500 mL Fluid      05/02/23 0830            Objective: Vitals:   05/02/23 1932 05/03/23 0345 05/03/23 0700 05/03/23 0730  BP: 121/67 113/62  118/74  Pulse: 97 (!) 104  100  Resp: 20 20 (!) 32 (!) 24  Temp: 98 F (36.7 C) 98.2 F (36.8 C)  98.1 F (36.7 C)  TempSrc: Oral Oral  Oral  SpO2: 98% 99%  99%  Weight:        Intake/Output Summary (Last 24 hours) at 05/03/2023 1140 Last data filed at 05/03/2023 1000 Gross per 24 hour  Intake 237 ml  Output 1800 ml  Net -1563 ml   Filed Weights   05/01/23 0500 05/02/23 0724  Weight: 81.9 kg 82.6 kg    Scheduled Meds:  aspirin EC  81 mg Oral Daily   atorvastatin  80 mg Oral Daily   busPIRone  10 mg Oral TID   chlorhexidine  60 mL Topical Once   Chlorhexidine Gluconate Cloth  6 each Topical Q0600   furosemide  80 mg Intravenous BID   insulin aspart  0-9 Units Subcutaneous TID WC   melatonin  5 mg Oral QHS   metoprolol succinate  12.5 mg Oral Daily   mupirocin ointment  1 Application Nasal BID   pantoprazole  40 mg Oral Daily   povidone-iodine  2 Application Topical Once   sodium chloride flush  3 mL Intravenous Q12H   umeclidinium-vilanterol  1 puff Inhalation Daily   Continuous Infusions:  sodium chloride      ceFAZolin (ANCEF) IV     tranexamic acid      Nutritional status     Body mass index is 29.39 kg/m.  Data Reviewed:   CBC: Recent Labs  Lab 05/01/23 0636 05/02/23 0309 05/03/23 0318  WBC 8.9 9.4 12.2*  NEUTROABS 7.4  --   --   HGB 11.0* 10.5* 10.9*  HCT 35.9* 35.2* 35.0*  MCV 92.8 91.4 90.4  PLT 174 167 193   Basic Metabolic Panel: Recent Labs  Lab 05/01/23 0636 05/02/23 0309 05/03/23 0318  NA 136 136 135  K 4.2 4.2 4.6  CL 96* 99 96*  CO2 30 29 27   GLUCOSE 147* 148* 154*  BUN 49* 53* 51*  CREATININE 1.66* 1.75* 1.54*  CALCIUM 8.9 8.6* 9.0  MG  --  1.7 1.7  PHOS  --  3.8  --    GFR: Estimated Creatinine Clearance: 31.6 mL/min (A) (by C-G formula based on SCr of 1.54  mg/dL (H)). Liver Function Tests: Recent Labs  Lab 05/01/23 0636 05/02/23 0309 05/03/23 0318  AST 24 21 16   ALT 18 17 15   ALKPHOS 58 58 56  BILITOT 0.9 0.9 1.0  PROT 6.0* 5.8* 6.1*  ALBUMIN 2.9* 2.7* 2.7*   No results for input(s): "LIPASE", "AMYLASE" in the last 168 hours. No results for input(s): "AMMONIA" in the last 168 hours. Coagulation Profile: Recent Labs  Lab 05/01/23 0636  INR 1.1   Cardiac Enzymes: No results for input(s): "CKTOTAL", "CKMB", "CKMBINDEX", "TROPONINI" in the last 168 hours. BNP (last 3 results) No results for input(s): "PROBNP" in the last 8760  hours. HbA1C: No results for input(s): "HGBA1C" in the last 72 hours. CBG: Recent Labs  Lab 05/02/23 1153 05/02/23 1718 05/02/23 1926 05/03/23 0731 05/03/23 1134  GLUCAP 163* 132* 143* 162* 144*   Lipid Profile: No results for input(s): "CHOL", "HDL", "LDLCALC", "TRIG", "CHOLHDL", "LDLDIRECT" in the last 72 hours. Thyroid Function Tests: Recent Labs    05/01/23 0636 05/02/23 0309  TSH 6.532*  --   FREET4  --  0.68   Anemia Panel: Recent Labs    05/01/23 0636  VITAMINB12 1,012*   Sepsis Labs: No results for input(s): "PROCALCITON", "LATICACIDVEN" in the last 168 hours.  Recent Results (from the past 240 hour(s))  Surgical pcr screen     Status: Abnormal   Collection Time: 05/01/23  3:53 AM   Specimen: Nasal Mucosa; Nasal Swab  Result Value Ref Range Status   MRSA, PCR NEGATIVE NEGATIVE Final   Staphylococcus aureus POSITIVE (A) NEGATIVE Final    Comment: (NOTE) The Xpert SA Assay (FDA approved for NASAL specimens in patients 56 years of age and older), is one component of a comprehensive surveillance program. It is not intended to diagnose infection nor to guide or monitor treatment. Performed at [facility], [address], [place], [place] [address]          Radiology Studies: [facility] Chest [place] 1V same Day  Result Date: 05/03/2023 CLINICAL DATA:  Shortness of breath,  congestive heart failure. EXAM: PORTABLE CHEST 1 VIEW COMPARISON:  May 01, 2023. FINDINGS: Stable cardiomegaly. Mild central pulmonary vascular congestion is noted. Mild interstitial densities are noted concerning for pulmonary edema. Bony thorax is unremarkable. IMPRESSION: Cardiomegaly with mild central pulmonary vascular congestion and probable bilateral pulmonary edema. Aortic Atherosclerosis (ICD10-I70.0). Electronically Signed   By: Lupita Raider M.D.   On: 05/03/2023 09:29   ECHOCARDIOGRAM LIMITED  Result Date: 05/01/2023    ECHOCARDIOGRAM LIMITED REPORT   Patient Name:   Angela Lucero  Date of Exam: 05/01/2023 Medical Rec #:  [id number]  Height:       66.0 in Accession #:    [id number] Weight:       180.6 lb Date of Birth:  [birth year]  BSA:          1.915 m Patient Age:    81 years   BP:           92/58 mmHg Patient Gender: F          HR:           101 bpm. Exam Location:  Inpatient Procedure: Limited Echo and Intracardiac Opacification Agent Indications:    CHF-Acute Systolic I50.21  History:        Patient has prior history of Echocardiogram examinations, most                 recent 04/01/2023. CHF, COPD and Stroke; Risk                 Factors:Hypertension, Diabetes and Dyslipidemia.  Sonographer:    Celesta Gentile RCS Referring Phys: [id number] SUBRINA SUNDIL IMPRESSIONS  1. Anterior wall is very thin c/w scar. Left ventricular ejection fraction, by estimation, is 20 to 25%. The left ventricle has severely decreased function. The left ventricular internal cavity size was severely dilated.  2. Right ventricular systolic function is normal. The right ventricular size is mildly enlarged.  3. The mitral valve is normal in structure.  4. The aortic valve is normal in structure.  5. The inferior vena cava is  dilated in size with >50% respiratory variability, suggesting right atrial pressure of 8 mmHg. Conclusion(s)/Recommendation(s): Limited study. No detailed valve assessment. Otherwise no significant changes  from prior study. FINDINGS  Left Ventricle: Anterior wall is very thin c/w scar. Left ventricular ejection fraction, by estimation, is 20 to 25%. The left ventricle has severely decreased function. Definity contrast agent was given IV to delineate the left ventricular endocardial borders. The left ventricular internal cavity size was severely dilated. Right Ventricle: The right ventricular size is mildly enlarged. Right ventricular systolic function is normal. Pericardium: There is no evidence of pericardial effusion. Mitral Valve: The mitral valve is normal in structure. Aortic Valve: The aortic valve is normal in structure. Venous: The inferior vena cava is dilated in size with greater than 50% respiratory variability, suggesting right atrial pressure of 8 mmHg. LEFT VENTRICLE PLAX 2D LVIDd:         6.80 cm LVIDs:         6.00 cm LV PW:         1.20 cm LV IVS:        0.70 cm LVOT diam:     1.90 cm LVOT Area:     2.84 cm  LV Volumes (MOD) LV vol d, MOD A2C: 157.0 ml LV vol d, MOD A4C: 190.0 ml LV vol s, MOD A2C: 138.0 ml LV vol s, MOD A4C: 132.0 ml LV SV MOD A2C:     19.0 ml LV SV MOD A4C:     190.0 ml LV SV MOD BP:      40.9 ml RIGHT VENTRICLE TAPSE (M-mode): 2.4 cm LEFT ATRIUM         Index LA diam:    4.10 cm 2.14 cm/m   AORTA Ao Root diam: 3.60 cm  SHUNTS Systemic Diam: 1.90 cm Carolan Clines Electronically signed by Carolan Clines Signature Date/Time: 05/01/2023/4:33:23 PM    Final            LOS: 2 days   Time spent= 35 mins    Miguel Rota, MD [facility]  If 7PM-7AM, please contact night-coverage  05/03/2023, 11:40 AM

## 2023-05-03 NOTE — Progress Notes (Signed)
The risks benefits and alternatives were discussed with the patient including but not limited to the risks of nonoperative treatment, versus surgical intervention including infection, bleeding, nerve injury, periprosthetic fracture, the need for revision surgery, dislocation, leg length discrepancy, blood clots, cardiopulmonary complications, morbidity, mortality, among others, and they were willing to proceed.    Plan right hip hemi.   Eulas Post, MD

## 2023-05-03 NOTE — Progress Notes (Signed)
     Subjective: Patient reports pain as mild. Less short of breath today. Not cleared by cardiology today. Possible OR today with Dr. Dion Saucier if cleared by cardiology.  Objective:   VITALS:   Vitals:   05/02/23 1152 05/02/23 1554 05/02/23 1932 05/03/23 0345  BP: (!) 103/58 113/65 121/67 113/62  Pulse:  94 97 (!) 104  Resp: (!) 25 17 20 20   Temp: 98.2 F (36.8 C) 98.2 F (36.8 C) 98 F (36.7 C) 98.2 F (36.8 C)  TempSrc: Oral Oral Oral Oral  SpO2: 100% 98% 98% 99%  Weight:        Sensation intact distally Intact pulses distally Dorsiflexion/Plantar flexion intact Compartment soft    Lab Results  Component Value Date   WBC 12.2 (H) 05/03/2023   HGB 10.9 (L) 05/03/2023   HCT 35.0 (L) 05/03/2023   MCV 90.4 05/03/2023   PLT 193 05/03/2023   BMET    Component Value Date/Time   NA 135 05/03/2023 0318   NA 137 04/25/2023 1620   K 4.6 05/03/2023 0318   CL 96 (L) 05/03/2023 0318   CO2 27 05/03/2023 0318   GLUCOSE 154 (H) 05/03/2023 0318   BUN 51 (H) 05/03/2023 0318   BUN 50 (H) 04/25/2023 1620   CREATININE 1.54 (H) 05/03/2023 0318   CALCIUM 9.0 05/03/2023 0318   EGFR 34 (L) 04/25/2023 1620   GFRNONAA 34 (L) 05/03/2023 0318    Xray: X-rays reviewed demonstrate displaced and shortened femoral neck fracture on the right.  Assessment/Plan:     Principal Problem:   Closed right hip fracture, initial encounter (HCC) Active Problems:   Acute exacerbation of CHF (congestive heart failure) (HCC)   COPD (chronic obstructive pulmonary disease) (HCC)   GAD (generalized anxiety disorder)   Hyperlipidemia   Displaced fracture of right femoral neck (HCC)   History of CAD (coronary artery disease)   Chronic hypoxic respiratory failure (HCC)   Chronic kidney disease (CKD), stage III (moderate) (HCC)  Right displaced femoral neck fracture  Discussed with the patient plan for hip hemiarthroplasty when she is cleared by medicine and cardiology.  N.p.o. today for possible  OR after patient is evaluated by cardiology.   Cindee Mclester A Jalexus Brett 05/03/2023, 7:24 AM   Weber Cooks, MD  Contact information:   [phone] 7am-5pm epic message Dr. Blanchie Dessert, or call office for patient follow up: [phone] After hours and holidays please check [email or web address] for group call information for [facility]

## 2023-05-03 NOTE — Anesthesia Procedure Notes (Signed)
Spinal  Patient location during procedure: OR Start time: 05/03/2023 1:00 PM End time: 05/03/2023 1:05 PM Reason for block: surgical anesthesia Staffing Performed: anesthesiologist  Anesthesiologist: Olanta Nation, MD Performed by: [place] Nation, MD Authorized by: Graham Nation, MD   Preanesthetic Checklist Completed: patient identified, IV checked, site marked, risks and benefits discussed, surgical consent, monitors and equipment checked, pre-op evaluation and timeout performed Spinal Block Patient position: sitting Prep: DuraPrep Patient monitoring: heart rate, cardiac monitor, continuous pulse ox and blood pressure Approach: midline Location: L4-5 Needle Needle type: Pencan  Needle gauge: 24 G Assessment Sensory level: T6 Additional Notes Functioning IV was confirmed and monitors were applied. Sterile prep and drape, including hand hygiene and sterile gloves were used. The patient was positioned and the spine was prepped. The skin was anesthetized with lidocaine.  Free flow of clear CSF was obtained prior to injecting local anesthetic into the CSF.  The spinal needle aspirated freely following injection.  The needle was carefully withdrawn.  The patient tolerated the procedure well.

## 2023-05-04 ENCOUNTER — Encounter (HOSPITAL_COMMUNITY): Payer: Self-pay | Admitting: Orthopedic Surgery

## 2023-05-04 DIAGNOSIS — J9611 Chronic respiratory failure with hypoxia: Secondary | ICD-10-CM

## 2023-05-04 DIAGNOSIS — I5023 Acute on chronic systolic (congestive) heart failure: Secondary | ICD-10-CM | POA: Diagnosis not present

## 2023-05-04 DIAGNOSIS — S72001A Fracture of unspecified part of neck of right femur, initial encounter for closed fracture: Secondary | ICD-10-CM | POA: Diagnosis not present

## 2023-05-04 LAB — BASIC METABOLIC PANEL
Anion gap: 14 (ref 5–15)
BUN: 56 mg/dL — ABNORMAL HIGH (ref 8–23)
CO2: 27 mmol/L (ref 22–32)
Calcium: 8.7 mg/dL — ABNORMAL LOW (ref 8.9–10.3)
Chloride: 94 mmol/L — ABNORMAL LOW (ref 98–111)
Creatinine, Ser: 1.54 mg/dL — ABNORMAL HIGH (ref 0.44–1.00)
GFR, Estimated: 34 mL/min — ABNORMAL LOW (ref >60)
Glucose, Bld: 153 mg/dL — ABNORMAL HIGH (ref 70–99)
Potassium: 5 mmol/L (ref 3.5–5.1)
Sodium: 135 mmol/L (ref 135–145)

## 2023-05-04 LAB — GLUCOSE, CAPILLARY
Glucose-Capillary: 145 mg/dL — ABNORMAL HIGH (ref 70–99)
Glucose-Capillary: 191 mg/dL — ABNORMAL HIGH (ref 70–99)
Glucose-Capillary: 172 mg/dL — ABNORMAL HIGH (ref 70–99)
Glucose-Capillary: 155 mg/dL — ABNORMAL HIGH (ref 70–99)

## 2023-05-04 LAB — CBC
HCT: 32.9 % — ABNORMAL LOW (ref 36.0–46.0)
Hemoglobin: 10.1 g/dL — ABNORMAL LOW (ref 12.0–15.0)
MCH: 28 pg (ref 26.0–34.0)
MCHC: 30.7 g/dL (ref 30.0–36.0)
MCV: 91.1 fL (ref 80.0–100.0)
Platelets: 219 10*3/uL (ref 150–400)
RBC: 3.61 MIL/uL — ABNORMAL LOW (ref 3.87–5.11)
RDW: 18.1 % — ABNORMAL HIGH (ref 11.5–15.5)
WBC: 13.3 10*3/uL — ABNORMAL HIGH (ref 4.0–10.5)
nRBC: 0 % (ref 0.0–0.2)

## 2023-05-04 LAB — MAGNESIUM: Magnesium: 1.8 mg/dL (ref 1.7–2.4)

## 2023-05-04 MED ORDER — HYDROCODONE-ACETAMINOPHEN 5-325 MG PO TABS: 1.0000 | ORAL_TABLET | Freq: Four times a day (QID) | ORAL | 0 refills | Status: DC | PRN

## 2023-05-04 MED ORDER — CHLORHEXIDINE GLUCONATE CLOTH 2 % EX PADS
6.0000 | MEDICATED_PAD | Freq: Every day | CUTANEOUS | Status: DC
  Administered 2023-05-04 – 2023-05-09 (×6): 6 via TOPICAL

## 2023-05-04 MED ORDER — ENOXAPARIN SODIUM 40 MG/0.4ML IJ SOSY: 30.0000 mg | PREFILLED_SYRINGE | INTRAMUSCULAR | 0 refills | Status: DC

## 2023-05-04 MED ORDER — HYDROMORPHONE HCL 1 MG/ML IJ SOLN
0.5000 mg | Freq: Four times a day (QID) | INTRAMUSCULAR | Status: AC | PRN
  Administered 2023-05-04 – 2023-05-06 (×3): 0.5 mg via INTRAVENOUS
  Filled 2023-05-04 (×3): qty 0.5

## 2023-05-04 NOTE — Progress Notes (Signed)
Subjective: Patient awake and alert this morning. Reports approx 6/10 pain along the right thigh that she feels like is improving and well controlled with current pain regimen. SOB improved, now on 1L [place]. Notes improved comfort with leg elevation and ice packs. Denies worsening SOB, chest pain, abdominal pain, lower extremity edema, fever, chills, N/V.   Objective:   VITALS:   Vitals:   05/03/23 1819 05/03/23 1952 05/03/23 2343 05/04/23 0827  BP: (!) 92/48 100/76 106/62 112/71  Pulse: 80 89 89 95  Resp: (!) 22 20 20 19   Temp: 97.9 F (36.6 C) 98.1 F (36.7 C) 97.9 F (36.6 C) 97.6 F (36.4 C)  TempSrc: Oral Oral Oral   SpO2: 93% 94% 100% 92%  Weight:       Foley present draining yellow urine  Physical Exam: General: NAD, A/O x4, laying with HOB at 30 degrees, right leg elevated  Respiratory: No increased WOB, Breathing comfortably on 1L [place] Right Hip Exam   Tenderness  The patient is experiencing tenderness in the lateral.  Muscle Strength  Right hip normal muscle strength: Freely moves right leg. ROM and strength not tested in early post operative period.  Other  Sensation: normal Pulse: present  Comments:  Incision to right posterolateral hip with bandage covering, C/D/I without saturation. Sensation intact distally Intact pulses distally Dorsiflexion/Plantar flexion intact Compartment soft  1+ pedal edema bilaterally         Lab Results  Component Value Date   WBC 13.3 (H) 05/04/2023   HGB 10.1 (L) 05/04/2023   HCT 32.9 (L) 05/04/2023   MCV 91.1 05/04/2023   PLT 219 05/04/2023   BMET    Component Value Date/Time   NA 135 05/04/2023 0636   NA 137 04/25/2023 1620   K 5.0 05/04/2023 0636   CL 94 (L) 05/04/2023 0636   CO2 27 05/04/2023 0636   GLUCOSE 153 (H) 05/04/2023 0636   BUN 56 (H) 05/04/2023 0636   BUN 50 (H) 04/25/2023 1620   CREATININE 1.54 (H) 05/04/2023 0636   CALCIUM 8.7 (L) 05/04/2023 0636   EGFR 34 (L) 04/25/2023 1620   GFRNONAA  34 (L) 05/04/2023 0636    Post- OP R Hip XR 9/3: expected post surgical changes with excellent position and placement of hardware  Assessment/Plan: 1 Day Post-Op   Right displaced femoral neck fracture- S/p Right Hemi Arthroplasty  - WBAT, ambulate as patient is able - DVT ppx: Lovenox, continue daily ASA; continue Lovenox after discharge for post op DVT ppx  - Transfuse for hgb <8 given CHF, post op hgb 10.1, expected leukocytosis  - Pain management:   - Mild pain: Tylenol 500mg  Q6h SCH  - Mod pain: Norco 5/325 q4h prn  - Severe pain: Norco 7.5/325 q4h prn - Additional medical mgmt per hospitalist and cardiology  - Dispo plan: Anticipate discharge to SNF based on discussion with patient this morning. Has help from son and neighbors    Principal Problem:   Closed right hip fracture, initial encounter [facility]) Active Problems:   Acute exacerbation of CHF (congestive heart failure) (HCC)   COPD (chronic obstructive pulmonary disease) (HCC)   GAD (generalized anxiety disorder)   Hyperlipidemia   Displaced fracture of right femoral neck (HCC)   History of CAD (coronary artery disease)   Chronic hypoxic respiratory failure (HCC)   Chronic kidney disease (CKD), stage III (moderate) (HCC)      Angela Lucero 05/04/2023, 2:46 PM   Attending: Dorthula Nettles, MD  Contact information:   Weekdays 7am-5pm epic message Dr. Dion Saucier, or call office for patient follow up: [phone] After hours and holidays please check [email or web address] for group call information for [facility]

## 2023-05-04 NOTE — Anesthesia Postprocedure Evaluation (Signed)
Anesthesia Post Note  Patient: Angela Lucero  Procedure(s) Performed: POSTERIOR HEMI HIP (Right: Hip)     Patient location during evaluation: PACU Anesthesia Type: Spinal Level of consciousness: awake and alert Pain management: pain level controlled Vital Signs Assessment: post-procedure vital signs reviewed and stable Respiratory status: spontaneous breathing, nonlabored ventilation, respiratory function stable and patient connected to nasal cannula oxygen Cardiovascular status: blood pressure returned to baseline and stable Postop Assessment: no apparent nausea or vomiting Anesthetic complications: no   No notable events documented.  Last Vitals:  Vitals:   05/03/23 2343 05/04/23 0827  BP: 106/62 112/71  Pulse: 89 95  Resp: 20 19  Temp: 36.6 C 36.4 C  SpO2: 100% 92%    Last Pain:  Vitals:   05/04/23 0900  TempSrc:   PainSc: 7                  [place] Nation

## 2023-05-04 NOTE — Progress Notes (Signed)
Rounding Note    Patient Name: Angela Lucero Date of Encounter: 05/04/2023  [facility] Cardiologist: Gypsy Balsam, MD   Subjective   Tolerated surgery.  Still with some hip pain.   Inpatient Medications    Scheduled Meds:  acetaminophen  500 mg Oral Q6H   aspirin EC  81 mg Oral Daily   atorvastatin  80 mg Oral Daily   busPIRone  10 mg Oral TID   docusate sodium  100 mg Oral BID   enoxaparin (LOVENOX) injection  30 mg Subcutaneous Q24H   ferrous sulfate  325 mg Oral TID PC   furosemide  80 mg Intravenous BID   insulin aspart  0-9 Units Subcutaneous TID WC   melatonin  5 mg Oral QHS   metoprolol succinate  12.5 mg Oral Daily   mupirocin ointment  1 Application Nasal BID   pantoprazole  40 mg Oral Daily   senna  1 tablet Oral BID   umeclidinium-vilanterol  1 puff Inhalation Daily   Continuous Infusions:  sodium chloride Stopped (05/03/23 2144)   PRN Meds: sodium chloride, acetaminophen, alum & mag hydroxide-simeth, bisacodyl, clonazePAM, dextrose, hydrALAZINE, HYDROcodone-acetaminophen, HYDROcodone-acetaminophen, levalbuterol, magnesium citrate, menthol-cetylpyridinium **OR** phenol, nitroGLYCERIN, ondansetron **OR** ondansetron (ZOFRAN) IV, polyethylene glycol   Vital Signs    Vitals:   05/03/23 1819 05/03/23 1952 05/03/23 2343 05/04/23 0827  BP: (!) 92/48 100/76 106/62 112/71  Pulse: 80 89 89 95  Resp: (!) 22 20 20 19   Temp: 97.9 F (36.6 C) 98.1 F (36.7 C) 97.9 F (36.6 C) 97.6 F (36.4 C)  TempSrc: Oral Oral Oral   SpO2: 93% 94% 100% 92%  Weight:        Intake/Output Summary (Last 24 hours) at 05/04/2023 0912 Last data filed at 05/03/2023 1900 Gross per 24 hour  Intake 1010.36 ml  Output 680 ml  Net 330.36 ml      05/02/2023    7:24 AM 05/01/2023    5:00 AM 04/25/2023    3:40 PM  Last 3 Weights  Weight (lbs) 182 lb 1.6 oz 180 lb 8.9 oz 180 lb 8 oz  Weight (kg) 82.6 kg 81.9 kg 81.874 kg      Telemetry    NSR - Personally Reviewed  ECG       Physical Exam   GEN: No acute distress.   Neck: No JVD Cardiac: RRR, premature beats, no murmurs, rubs, or gallops.  Respiratory: Clear to auscultation bilaterally. GI: Soft, nontender, non-distended  MS: No edema; No deformity. Neuro:  Nonfocal  Psych: Normal affect   Labs    High Sensitivity Troponin:  No results for input(s): "TROPONINIHS" in the last 720 hours.   Chemistry Recent Labs  Lab 05/01/23 0636 05/02/23 0309 05/03/23 0318 05/04/23 0636  NA 136 136 135 135  K 4.2 4.2 4.6 5.0  CL 96* 99 96* 94*  CO2 30 29 27 27   GLUCOSE 147* 148* 154* 153*  BUN 49* 53* 51* 56*  CREATININE 1.66* 1.75* 1.54* 1.54*  CALCIUM 8.9 8.6* 9.0 8.7*  MG  --  1.7 1.7 1.8  PROT 6.0* 5.8* 6.1*  --   ALBUMIN 2.9* 2.7* 2.7*  --   AST 24 21 16   --   ALT 18 17 15   --   ALKPHOS 58 58 56  --   BILITOT 0.9 0.9 1.0  --   GFRNONAA 31* 29* 34* 34*  ANIONGAP 10 8 12 14     Lipids No results for input(s): "CHOL", "TRIG", "HDL", "  LABVLDL", "LDLCALC", "CHOLHDL" in the last 168 hours.  Hematology Recent Labs  Lab 05/02/23 0309 05/03/23 0318 05/04/23 0636  WBC 9.4 12.2* 13.3*  RBC 3.85* 3.87 3.61*  HGB 10.5* 10.9* 10.1*  HCT 35.2* 35.0* 32.9*  MCV 91.4 90.4 91.1  MCH 27.3 28.2 28.0  MCHC 29.8* 31.1 30.7  RDW 18.1* 18.4* 18.1*  PLT 167 193 219   Thyroid  Recent Labs  Lab 05/01/23 0636 05/02/23 0309  TSH 6.532*  --   FREET4  --  0.68    BNP Recent Labs  Lab 05/01/23 0636  BNP 2,534.0*    DDimer No results for input(s): "DDIMER" in the last 168 hours.   Radiology    DG HIP UNILAT W OR W/O PELVIS 2-3 VIEWS RIGHT  Result Date: 05/03/2023 CLINICAL DATA:  Status post right hip hemiarthroplasty. EXAM: DG HIP (WITH OR WITHOUT PELVIS) 2-3V RIGHT COMPARISON:  Right hip radiographs 05/01/2023 FINDINGS: Interval right hip hemiarthroplasty. No perihardware lucency is seen to indicate hardware failure or loosening. Expected postoperative subcutaneous air within the lateral right hip  and thigh. Mild-to-moderate bilateral inferior sacroiliac subchondral sclerosis. Mild left femoroacetabular joint space narrowing. No acute fracture or dislocation.Moderate atherosclerotic calcifications. IMPRESSION: Interval right hip hemiarthroplasty without evidence of hardware failure or loosening. Electronically Signed   By: Neita Garnet M.D.   On: 05/03/2023 18:06   DG Chest Port 1V same Day  Result Date: 05/03/2023 CLINICAL DATA:  Shortness of breath, congestive heart failure. EXAM: PORTABLE CHEST 1 VIEW COMPARISON:  May 01, 2023. FINDINGS: Stable cardiomegaly. Mild central pulmonary vascular congestion is noted. Mild interstitial densities are noted concerning for pulmonary edema. Bony thorax is unremarkable. IMPRESSION: Cardiomegaly with mild central pulmonary vascular congestion and probable bilateral pulmonary edema. Aortic Atherosclerosis (ICD10-I70.0). Electronically Signed   By: Lupita Raider M.D.   On: 05/03/2023 09:29    Cardiac Studies   Low EF by echo  Patient Profile     82 y.o. female with low EF, mitral regurgitation s/p hip surgery  Assessment & Plan    Acute on Chronic systolic CHF: Continue Lasix.  Renal function stable. If respiratory status stable tomorrow, can switch to home dose of oral diuretics.    Chronic resp failure: stable on 2-3 L        For questions or updates, please contact [facility] Please consult [email or web address] for contact info under        Signed, Lance Muss, MD  05/04/2023, 9:12 AM

## 2023-05-04 NOTE — Care Management Important Message (Signed)
Important Message  Patient Details  Name: Angela Lucero MRN: [id number] Date of Birth: Dec 11, 1940   Medicare Important Message Given:  Yes     Sherilyn Banker 05/04/2023, 1:22 PM

## 2023-05-04 NOTE — Evaluation (Signed)
Physical Therapy Evaluation Patient Details Name: Angela Lucero MRN: [id number] DOB: [birth year] Today's Date: 05/04/2023  History of Present Illness  Pt is 82 yo presenting to [facility] ED following a ground level fall while using her walker and sustaining a R hip fracture. She was found to have HF exacerbation as well and transferred to [facility]. Recently admitted to [facility] from 8/16-8/19 for NSTEMI and CHF exacerbation. Currently pt is s/p R posterior hemi hip arthroplasty. PMH: Acute exacerbation of CHF, COPD, GAD, hyperlipidemia, CAD, chronic hypoxic respiratory failure, Chronic kidney disease III.  Clinical Impression  Pt is presenting below baseline level of functioning. Prior to hospitalization pt lived alone with intermittent use of AD. Currently pt is total assist for bed mobility and sit to stand. Pt states she has a lot of support from her neighbor "Chrissy" Pt was unable to progress gait this session. Due to current functional status, home set up and available assistance at home recommending skilled physical therapy services > 3 hours/day 5-6x/week at a higher level of care and  frequency in order to return to PLOF with decreased risk for falls, injury and re-hospitalization.       If plan is discharge home, recommend the following: A little help with walking and/or transfers;Assistance with cooking/housework;Assist for transportation;Help with stairs or ramp for entrance   Can travel by private vehicle   No    Equipment Recommendations Other (comment) (defer to post acute)  Recommendations for Other Services  Rehab consult;OT consult    Functional Status Assessment Patient has had a recent decline in their functional status and demonstrates the ability to make significant improvements in function in a reasonable and predictable amount of time.     Precautions / Restrictions Precautions Precautions: Posterior Hip Precaution Booklet Issued: Yes (comment) Precaution Comments: pt  educated on hip precautions Restrictions Weight Bearing Restrictions: Yes RLE Weight Bearing: Weight bearing as tolerated      Mobility  Bed Mobility Overal bed mobility: Needs Assistance Bed Mobility: Supine to Sit, Sit to Supine     Supine to sit: +2 for physical assistance, Max assist Sit to supine: +2 for physical assistance, Max assist   General bed mobility comments: Pt was able to help bring her LLE toward the EOB and rotate her trunk toward the L. Max A +2 for supine<>sitting EOB making pt a total assist.    Transfers Overall transfer level: Needs assistance Equipment used: Rolling walker (2 wheels) Transfers: Sit to/from Stand Sit to Stand: Max assist, +2 physical assistance           General transfer comment: Pt was able to get to standing with Max A +2 and pulling up on the RW with Max stabilization in order to prevent if flipping back. Pt was unable to assist with pushing up from the EOB which was attempted first. pt was unable to get fully upright standing EOB with heavy reliance on AD.    Ambulation/Gait   General Gait Details: Pt attempted to progress her LLE and was unable. Unable to progress gait at this time.      Balance Overall balance assessment: Needs assistance Sitting-balance support: Bilateral upper extremity supported, Feet unsupported, Feet supported, Single extremity supported Sitting balance-Leahy Scale: Poor Sitting balance - Comments: Min A, reliance on UE support   Standing balance support: Bilateral upper extremity supported, Reliant on assistive device for balance Standing balance-Leahy Scale: Poor Standing balance comment: Max A to remain standing with forward lean  Pertinent Vitals/Pain Pain Assessment Pain Assessment: 0-10 Pain Score: 8  Pain Location: R hip pain Pain Descriptors / Indicators: Aching, Sharp Pain Intervention(s): Monitored during session, Limited activity within patient's tolerance, Premedicated before  session    Home Living Family/patient expects to be discharged to:: Private residence Living Arrangements: Alone Available Help at Discharge: Family;Friend(s);Available PRN/intermittently (Chrissy who is a neighbor is able to help) Type of Home: Mobile home Home Access: Stairs to enter Entrance Stairs-Rails: Left Entrance Stairs-Number of Steps: 3   Home Layout: One level Home Equipment: Cane - single Librarian, academic (2 wheels);Shower seat;Grab bars - tub/shower Additional Comments: pt states that her son is contemplating a ramp for her home.    Prior Function Prior Level of Function : Independent/Modified Independent         Mobility Comments: Pt states that she furniture walkes in the home and either uses the cane or walker out in the community. ADLs Comments: neighbor gets her groceries and helps with driving/cleaning; has not driven for a year, sits to shower, fearful of falling in tub     Extremity/Trunk Assessment   Upper Extremity Assessment Upper Extremity Assessment: Defer to OT evaluation    Lower Extremity Assessment Lower Extremity Assessment: Generalized weakness    Cervical / Trunk Assessment Cervical / Trunk Assessment: Normal  Communication   Communication Communication: No apparent difficulties  Cognition Arousal: Alert Behavior During Therapy: WFL for tasks assessed/performed Overall Cognitive Status: Within Functional Limits for tasks assessed        General Comments General comments (skin integrity, edema, etc.): no noted drainage.        Assessment/Plan    PT Assessment Patient needs continued PT services  PT Problem List Decreased strength;Decreased mobility;Decreased safety awareness;Decreased activity tolerance;Decreased balance;Pain       PT Treatment Interventions DME instruction;Therapeutic exercise;Gait training;Balance training;Stair training;Neuromuscular re-education;Functional mobility training;Therapeutic  activities;Patient/family education    PT Goals (Current goals can be found in the Care Plan section)  Acute Rehab PT Goals Patient Stated Goal: Go to rehab to get stronger and return home. PT Goal Formulation: With patient Time For Goal Achievement: 05/18/23 Potential to Achieve Goals: Fair    Frequency Min 1X/week        AM-PAC PT "6 Clicks" Mobility  Outcome Measure Help needed turning from your back to your side while in a flat bed without using bedrails?: Total Help needed moving from lying on your back to sitting on the side of a flat bed without using bedrails?: Total Help needed moving to and from a bed to a chair (including a wheelchair)?: Total Help needed standing up from a chair using your arms (e.g., wheelchair or bedside chair)?: Total Help needed to walk in hospital room?: Total Help needed climbing 3-5 steps with a railing? : Total 6 Click Score: 6    End of Session Equipment Utilized During Treatment: Gait belt Activity Tolerance: Patient limited by pain Patient left: in bed;with call bell/phone within reach;with bed alarm set Nurse Communication: Mobility status PT Visit Diagnosis: Other abnormalities of gait and mobility (R26.89)    Time: 1610-9604 PT Time Calculation (min) (ACUTE ONLY): 56 min   Charges:   PT Evaluation $PT Eval Low Complexity: 1 Low PT Treatments $Therapeutic Activity: 38-52 mins PT General Charges $$ ACUTE PT VISIT: 1 Visit        Harrel Carina, DPT, CLT  Acute Rehabilitation Services Office: [phone] (Secure chat preferred)   Claudia Desanctis 05/04/2023, 2:04 PM

## 2023-05-04 NOTE — Progress Notes (Addendum)
Inpatient Rehab Admissions Coordinator:   Per therapy recommendations, patient was screened for CIR candidacy by Megan Salon, MS, CCC-SLP. At this time, Pt. Is total A for bed mobility and unable to achieve full standing with max A of 2 people. Pt. Is not at a level to tolerate the intensity of CIR at this time; however,  Pt. may have potential to progress to becoming a potential CIR candidate, so CIR admissions team will follow and monitor for progress and participation with therapies and place consult order if Pt. appears to be an appropriate candidate. Please contact me with any questions.   Megan Salon, MS, CCC-SLP Rehab Admissions Coordinator  [phone] (celll) [phone] (office)

## 2023-05-04 NOTE — Progress Notes (Signed)
Heart Failure Navigator Progress Note  Assessed for Heart & Vascular TOC clinic readiness.  Patient declined TOC as wishes to follow up with her cardiologist in [facility] will sign off at this time.    Trevontae Lindahl,RN, BSN,MSN Heart Failure Nurse Navigator. Contact by secure chat only.

## 2023-05-04 NOTE — Plan of Care (Signed)

## 2023-05-04 NOTE — Progress Notes (Signed)
PROGRESS NOTE    Angela Lucero  XBJ:[id number] DOB: 07/20/1941 DOA: 05/01/2023 PCP: [name], MD     Brief Narrative:  82 year old with history of systolic CHF EF 20-25%, CAD status post PCI, COPD on 2-3 L nasal cannula, CKD stage IIIa, CVA, HLD, HTN, DM2 comes to the [facility] ED for evaluation of fall while using her walker and sustaining right hip fracture and was also noted to be in CHF exacerbation therefore transferred to [facility] for further orthopedic and cardiology care as well.  She was recently admitted at [facility] from 8/16 - 8/19 for NSTEMI and CHF exacerbation treated with diuretics and eventually discharged.  Admits of medication compliance.  Upon admission, cardiology was consulted.  Patient's respirations are slowly improving with aggressive diuretics.     Assessment & Plan:  Principal Problem:   Closed right hip fracture, initial encounter [facility]) Active Problems:   Displaced fracture of right femoral neck (HCC)   Acute exacerbation of CHF (congestive heart failure) (HCC)   COPD (chronic obstructive pulmonary disease) (HCC)   GAD (generalized anxiety disorder)   Hyperlipidemia   History of CAD (coronary artery disease)   Chronic hypoxic respiratory failure (HCC)   Chronic kidney disease (CKD), stage III (moderate) (HCC)     Right-sided femoral neck fracture; Closed S/p Right hip hemiarthroplasty 9/3 Surgical procedure was delayed as patient had to be medically optimized from cardiac standpoint.  Eventually underwent ORIF on 9/3.  Postop management including pain management, weightbearing precautions, dressing and follow-up per orthopedic   Acute on chronic hypoxic respiratory distress Acute congestive heart failure with reduced EF, 20%, class III Patient's recent echocardiogram has shown EF of 20-25%.  Volume status improving.  Entresto on hold, diuretics per cardio.  Continue Toprol-XL  Tachycardia/tachypnea - Slowly proving with diuretics    Chronic hypoxic respiratory failure History of COPD Continue supplemental oxygen.  As needed bronchodilators.  Also continue home Anoro   CKD stage 3B Creatinine is currently stable around baseline of 1.6     History of CAD Currently chest pain-free.  Recent NSTEMI 2 weeks ago.  Currently on cardiac medications.  Further adjustments per cardiology team   Generalized anxiety disorder -Continue BuSpar 10 mg 3 times daily and Klonopin 0.5 mg twice daily as needed.     Hyperlipidemia -Continue Lipitor   DM type II  Sliding scale and Accu-Cheks   DVT prophylaxis:  SQ Heparin Code Status: DNR Diet:Cardiac diet Family Communication: Son Consults: Orthopedic surgeon & Cardiology.  Post op management PT/OT. ON going CHF management per cardio. Hopefully will be medically ready to be discharged in next 24-48 hrs.       Subjective: Tolerated ORIF yesterday.  This morning tells me she has pain as expected but breathing is significantly better.   Examination:  General exam: Appears calm and comfortable  Respiratory system: Minimal bibasilar crackles Cardiovascular system: S1 & S2 heard, RRR. No JVD, murmurs, rubs, gallops or clicks. No pedal edema. Gastrointestinal system: Abdomen is nondistended, soft and nontender. No organomegaly or masses felt. Normal bowel sounds heard. Central nervous system: Alert and oriented. No focal neurological deficits. Extremities: Symmetric 4 x 5 power. Skin: Right hip dressing noted Psychiatry: Judgement and insight appear normal. Mood & affect appropriate.       Diet Orders (From admission, onward)     Start     Ordered   05/03/23 1804  Diet Heart Room service appropriate? Yes; Fluid consistency: Thin  Diet effective now  Question Answer Comment  Room service appropriate? Yes   Fluid consistency: Thin      05/03/23 1803            Objective: Vitals:   05/03/23 1819 05/03/23 1952 05/03/23 2343 05/04/23 0827  BP: (!) 92/48  100/76 106/62 112/71  Pulse: 80 89 89 95  Resp: (!) 22 20 20 19   Temp: 97.9 F (36.6 C) 98.1 F (36.7 C) 97.9 F (36.6 C) 97.6 F (36.4 C)  TempSrc: Oral Oral Oral   SpO2: 93% 94% 100% 92%  Weight:        Intake/Output Summary (Last 24 hours) at 05/04/2023 1210 Last data filed at 05/03/2023 1900 Gross per 24 hour  Intake 1010.36 ml  Output 450 ml  Net 560.36 ml   Filed Weights   05/01/23 0500 05/02/23 0724  Weight: 81.9 kg 82.6 kg    Scheduled Meds:  acetaminophen  500 mg Oral Q6H   aspirin EC  81 mg Oral Daily   atorvastatin  80 mg Oral Daily   busPIRone  10 mg Oral TID   docusate sodium  100 mg Oral BID   enoxaparin (LOVENOX) injection  30 mg Subcutaneous Q24H   ferrous sulfate  325 mg Oral TID PC   furosemide  80 mg Intravenous BID   insulin aspart  0-9 Units Subcutaneous TID WC   melatonin  5 mg Oral QHS   metoprolol succinate  12.5 mg Oral Daily   mupirocin ointment  1 Application Nasal BID   pantoprazole  40 mg Oral Daily   senna  1 tablet Oral BID   umeclidinium-vilanterol  1 puff Inhalation Daily   Continuous Infusions:  sodium chloride Stopped (05/03/23 2144)    Nutritional status     Body mass index is 29.39 kg/m.  Data Reviewed:   CBC: Recent Labs  Lab 05/01/23 0636 05/02/23 0309 05/03/23 0318 05/04/23 0636  WBC 8.9 9.4 12.2* 13.3*  NEUTROABS 7.4  --   --   --   HGB 11.0* 10.5* 10.9* 10.1*  HCT 35.9* 35.2* 35.0* 32.9*  MCV 92.8 91.4 90.4 91.1  PLT 174 167 193 219   Basic Metabolic Panel: Recent Labs  Lab 05/01/23 0636 05/02/23 0309 05/03/23 0318 05/04/23 0636  NA 136 136 135 135  K 4.2 4.2 4.6 5.0  CL 96* 99 96* 94*  CO2 30 29 27 27   GLUCOSE 147* 148* 154* 153*  BUN 49* 53* 51* 56*  CREATININE 1.66* 1.75* 1.54* 1.54*  CALCIUM 8.9 8.6* 9.0 8.7*  MG  --  1.7 1.7 1.8  PHOS  --  3.8  --   --    GFR: Estimated Creatinine Clearance: 31.6 mL/min (A) (by C-G formula based on SCr of 1.54 mg/dL (H)). Liver Function Tests: Recent  Labs  Lab 05/01/23 0636 05/02/23 0309 05/03/23 0318  AST 24 21 16   ALT 18 17 15   ALKPHOS 58 58 56  BILITOT 0.9 0.9 1.0  PROT 6.0* 5.8* 6.1*  ALBUMIN 2.9* 2.7* 2.7*   No results for input(s): "LIPASE", "AMYLASE" in the last 168 hours. No results for input(s): "AMMONIA" in the last 168 hours. Coagulation Profile: Recent Labs  Lab 05/01/23 0636  INR 1.1   Cardiac Enzymes: No results for input(s): "CKTOTAL", "CKMB", "CKMBINDEX", "TROPONINI" in the last 168 hours. BNP (last 3 results) No results for input(s): "PROBNP" in the last 8760 hours. HbA1C: No results for input(s): "HGBA1C" in the last 72 hours. CBG: Recent Labs  Lab 05/03/23 0731 05/03/23  1134 05/03/23 1532 05/03/23 1821 05/04/23 0825  GLUCAP 162* 144* 108* 169* 145*   Lipid Profile: No results for input(s): "CHOL", "HDL", "LDLCALC", "TRIG", "CHOLHDL", "LDLDIRECT" in the last 72 hours. Thyroid Function Tests: Recent Labs    05/02/23 0309  FREET4 0.68   Anemia Panel: No results for input(s): "VITAMINB12", "FOLATE", "FERRITIN", "TIBC", "IRON", "RETICCTPCT" in the last 72 hours. Sepsis Labs: No results for input(s): "PROCALCITON", "LATICACIDVEN" in the last 168 hours.  Recent Results (from the past 240 hour(s))  Surgical pcr screen     Status: Abnormal   Collection Time: 05/01/23  3:53 AM   Specimen: Nasal Mucosa; Nasal Swab  Result Value Ref Range Status   MRSA, PCR NEGATIVE NEGATIVE Final   Staphylococcus aureus POSITIVE (A) NEGATIVE Final    Comment: (NOTE) The Xpert SA Assay (FDA approved for NASAL specimens in patients 24 years of age and older), is one component of a comprehensive surveillance program. It is not intended to diagnose infection nor to guide or monitor treatment. Performed at [facility], [address], [place], [place] [address]          Radiology Studies: DG HIP UNILAT W OR W/O PELVIS 2-3 VIEWS RIGHT  Result Date: 05/03/2023 CLINICAL DATA:  Status post right hip  hemiarthroplasty. EXAM: DG HIP (WITH OR WITHOUT PELVIS) 2-3V RIGHT COMPARISON:  Right hip radiographs 05/01/2023 FINDINGS: Interval right hip hemiarthroplasty. No perihardware lucency is seen to indicate hardware failure or loosening. Expected postoperative subcutaneous air within the lateral right hip and thigh. Mild-to-moderate bilateral inferior sacroiliac subchondral sclerosis. Mild left femoroacetabular joint space narrowing. No acute fracture or dislocation.Moderate atherosclerotic calcifications. IMPRESSION: Interval right hip hemiarthroplasty without evidence of hardware failure or loosening. Electronically Signed   By: Neita Garnet M.D.   On: 05/03/2023 18:06   DG Chest Port 1V same Day  Result Date: 05/03/2023 CLINICAL DATA:  Shortness of breath, congestive heart failure. EXAM: PORTABLE CHEST 1 VIEW COMPARISON:  May 01, 2023. FINDINGS: Stable cardiomegaly. Mild central pulmonary vascular congestion is noted. Mild interstitial densities are noted concerning for pulmonary edema. Bony thorax is unremarkable. IMPRESSION: Cardiomegaly with mild central pulmonary vascular congestion and probable bilateral pulmonary edema. Aortic Atherosclerosis (ICD10-I70.0). Electronically Signed   By: Lupita Raider M.D.   On: 05/03/2023 09:29           LOS: 3 days   Time spent= 35 mins    Miguel Rota, MD [facility]  If 7PM-7AM, please contact night-coverage  05/04/2023, 12:10 PM

## 2023-05-05 DIAGNOSIS — J9611 Chronic respiratory failure with hypoxia: Secondary | ICD-10-CM | POA: Diagnosis not present

## 2023-05-05 DIAGNOSIS — I5023 Acute on chronic systolic (congestive) heart failure: Secondary | ICD-10-CM | POA: Diagnosis not present

## 2023-05-05 LAB — CBC
HCT: 32.2 % — ABNORMAL LOW (ref 36.0–46.0)
Hemoglobin: 9.9 g/dL — ABNORMAL LOW (ref 12.0–15.0)
MCH: 27.3 pg (ref 26.0–34.0)
MCHC: 30.7 g/dL (ref 30.0–36.0)
MCV: 89 fL (ref 80.0–100.0)
Platelets: 243 10*3/uL (ref 150–400)
RBC: 3.62 MIL/uL — ABNORMAL LOW (ref 3.87–5.11)
RDW: 17.8 % — ABNORMAL HIGH (ref 11.5–15.5)
WBC: 11.4 10*3/uL — ABNORMAL HIGH (ref 4.0–10.5)
nRBC: 0 % (ref 0.0–0.2)

## 2023-05-05 LAB — GLUCOSE, CAPILLARY
Glucose-Capillary: 129 mg/dL — ABNORMAL HIGH (ref 70–99)
Glucose-Capillary: 225 mg/dL — ABNORMAL HIGH (ref 70–99)
Glucose-Capillary: 151 mg/dL — ABNORMAL HIGH (ref 70–99)
Glucose-Capillary: 208 mg/dL — ABNORMAL HIGH (ref 70–99)

## 2023-05-05 LAB — BASIC METABOLIC PANEL
Anion gap: 11 (ref 5–15)
BUN: 65 mg/dL — ABNORMAL HIGH (ref 8–23)
CO2: 26 mmol/L (ref 22–32)
Calcium: 8.4 mg/dL — ABNORMAL LOW (ref 8.9–10.3)
Chloride: 94 mmol/L — ABNORMAL LOW (ref 98–111)
Creatinine, Ser: 1.57 mg/dL — ABNORMAL HIGH (ref 0.44–1.00)
GFR, Estimated: 33 mL/min — ABNORMAL LOW (ref >60)
Glucose, Bld: 148 mg/dL — ABNORMAL HIGH (ref 70–99)
Potassium: 4.2 mmol/L (ref 3.5–5.1)
Sodium: 131 mmol/L — ABNORMAL LOW (ref 135–145)

## 2023-05-05 LAB — MAGNESIUM: Magnesium: 1.8 mg/dL (ref 1.7–2.4)

## 2023-05-05 MED ORDER — POLYETHYLENE GLYCOL 3350 17 G PO PACK
34.0000 g | PACK | Freq: Every day | ORAL | Status: DC
  Administered 2023-05-06 – 2023-05-08 (×3): 34 g via ORAL
  Filled 2023-05-05 (×4): qty 2

## 2023-05-05 NOTE — Progress Notes (Signed)
Subjective: She is having some expected pain still but improved. States she was able to stand yesterday with PT assist for approx 2-3 minutes. Her main concern is plan moving forward regarding inpatient rehab. She is thankful for much she has improved since admission. Denies SOB, CP, fevers, chills, N/V, focal deficits, new or worsening pain.    Objective:   VITALS:   Vitals:   05/04/23 1648 05/04/23 2003 05/05/23 0432 05/05/23 0532  BP: 109/71 104/64 99/64 110/61  Pulse: 93 (!) 105 96   Resp: 18 18 20    Temp: (!) 97.5 F (36.4 C) 97.9 F (36.6 C) 97.8 F (36.6 C)   TempSrc: Oral Oral Oral   SpO2: 99%  95%   Weight:       Foley present draining yellow urine  Physical Exam: General: NAD, A/O x4, laying with HOB at 30 degrees, right leg elevated  Respiratory: No increased WOB, Breathing comfortably on 1L [place] Right Hip Exam   Tenderness  The patient is experiencing tenderness in the lateral.  Muscle Strength  Right hip normal muscle strength: Freely moves right leg. ROM and strength not tested in early post operative period.  Other  Sensation: normal Pulse: present  Comments:  Incision to right posterolateral hip with bandage covering, C/D/I without saturation. Sensation intact distally Intact pulses distally Dorsiflexion/Plantar flexion intact Compartment soft          Lab Results  Component Value Date   WBC 13.3 (H) 05/04/2023   HGB 10.1 (L) 05/04/2023   HCT 32.9 (L) 05/04/2023   MCV 91.1 05/04/2023   PLT 219 05/04/2023   BMET    Component Value Date/Time   NA 135 05/04/2023 0636   NA 137 04/25/2023 1620   K 5.0 05/04/2023 0636   CL 94 (L) 05/04/2023 0636   CO2 27 05/04/2023 0636   GLUCOSE 153 (H) 05/04/2023 0636   BUN 56 (H) 05/04/2023 0636   BUN 50 (H) 04/25/2023 1620   CREATININE 1.54 (H) 05/04/2023 0636   CALCIUM 8.7 (L) 05/04/2023 0636   EGFR 34 (L) 04/25/2023 1620   GFRNONAA 34 (L) 05/04/2023 0636    Post- OP R Hip XR 9/3: expected  post surgical changes with excellent position and placement of hardware  Assessment/Plan: 2 Days Post-Op   Right displaced femoral neck fracture- S/p Right Hemi Arthroplasty  - WBAT, ambulate as patient is able - Continue dressing, if saturated can be replaced  - DVT ppx: SQ heparin given CrCl, continue daily ASA; likely to continue SQ heparin after discharge for post op DVT ppx for at least 4-6 weeks (appreciate input from hospital medicine and cardiology)  - Transfuse for hgb <8 given CHF - Pain management:   - Mild pain: Tylenol 500mg  Q6h [facility]  - Mod pain: Norco 5/325 q4h prn  - Severe pain: Norco 7.5/325 q4h prn - Additional medical mgmt per hospitalist and cardiology  - Dispo plan: Anticipate discharge to inpatient rehab based on discussion with patient and chart review. Dispo pending hospital medicine and cardiac clearance      Principal Problem:   Closed right hip fracture, initial encounter (HCC) Active Problems:   Acute exacerbation of CHF (congestive heart failure) (HCC)   COPD (chronic obstructive pulmonary disease) (HCC)   GAD (generalized anxiety disorder)   Hyperlipidemia   Displaced fracture of right femoral neck (HCC)   History of CAD (coronary artery disease)   Chronic hypoxic respiratory failure (HCC)   Chronic kidney disease (CKD), stage III (  moderate) (HCC)      Corinna Capra 05/05/2023, 6:58 AM   Attending: Dorthula Nettles, MD  Contact information:   [email or web address] 7am-5pm epic message Dr. Dion Saucier, or call office for patient follow up: [phone] After hours and holidays please check [email or web address] for group call information for [facility]

## 2023-05-05 NOTE — Progress Notes (Signed)
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  She was recently admitted at [facility] from 8/16 - 8/19 for NSTEMI and CHF exacerbation treated with diuretics and eventually discharged.  Admits of medication compliance.  Upon admission, cardiology was consulted.  Patient's respirations are slowly improving with aggressive diuretics.  Eventually patient was medically cleared for ORIF on 9/3.  Postoperatively doing well.     Assessment & Plan:  Principal Problem:   Closed right hip fracture, initial encounter [facility]) Active Problems:   Displaced fracture of right femoral neck (HCC)   Acute exacerbation of CHF (congestive heart failure) (HCC)   COPD (chronic obstructive pulmonary disease) (HCC)   GAD (generalized anxiety disorder)   Hyperlipidemia   History of CAD (coronary artery disease)   Chronic hypoxic respiratory failure (HCC)   Chronic kidney disease (CKD), stage III (moderate) (HCC)     Right-sided femoral neck fracture; Closed S/p Right hip hemiarthroplasty 9/3 Surgical procedure was delayed as patient had to be medically optimized from cardiac standpoint.  Eventually underwent ORIF on 9/3.  Postop management including pain management, weightbearing precautions, dressing and follow-up per orthopedic. No bm since surgery will escalate miralax   Acute on chronic hypoxic respiratory distress Acute congestive heart failure with reduced EF, 20%, class III Patient's recent echocardiogram has shown EF of 20-25%.  Volume status  improving.  Entresto on hold, diuretics per cardio. They are planning on switch to oral diuretics tomorrow.  Continue Toprol-XL  Tachycardia/tachypnea - Slowly proving with diuretics   Chronic hypoxic respiratory failure History of COPD Continue supplemental oxygen.  As needed bronchodilators.  Also continue home Anoro   CKD stage 3B Creatinine is currently stable around baseline of 1.6     History of CAD Currently chest pain-free.  Recent NSTEMI 2 weeks ago.  Currently on cardiac medications.  Further adjustments per cardiology team   Generalized anxiety disorder -Continue BuSpar 10 mg 3 times daily and Klonopin 0.5 mg twice daily as needed.     Hyperlipidemia -Continue Lipitor   DM type II  Sliding scale and Accu-Cheks   DVT prophylaxis:  SQ Heparin Code Status: DNR Diet:Cardiac diet Family Communication: son jeffrey updated telephonically 9/5 Consults: Orthopedic surgeon & Cardiology.  Pending snf placement, has declined CIR     Subjective: Pain controlled, thinks breathing worse on 1 liter requests home 2 liters   Examination:  General exam: Appears calm and comfortable  Respiratory system: Minimal bibasilar crackles Cardiovascular system: S1 & S2 heard, RRR. No JVD, murmurs, rubs, gallops or clicks.   Gastrointestinal system: Abdomen is nondistended, soft and nontender. No organomegaly or masses felt. Normal bowel sounds heard. Central nervous system: Alert and oriented. No focal neurological deficits. Extremities: Symmetric 4 x 5 power. Trace edema Skin: Right hip dressing noted Psychiatry: Judgement and insight appear normal. Mood & affect appropriate.       Diet Orders (From admission, onward)     Start     Ordered   05/03/23 1804  Diet Heart Room service appropriate? Yes;  Fluid consistency: Thin  Diet effective now       Question Answer Comment  Room service appropriate? Yes   Fluid consistency: Thin      05/03/23 1803             Objective: Vitals:   05/05/23 0432 05/05/23 0532 05/05/23 0737 05/05/23 0814  BP: 99/64 110/61 119/68 (!) 106/53  Pulse: 96  98 100  Resp: 20  17 17   Temp: 97.8 F (36.6 C)   (!) 97.4 F (36.3 C)  TempSrc: Oral     SpO2: 95%  98% 96%  Weight:        Intake/Output Summary (Last 24 hours) at 05/05/2023 1422 Last data filed at 05/04/2023 2316 Gross per 24 hour  Intake --  Output 1450 ml  Net -1450 ml   Filed Weights   05/01/23 0500 05/02/23 0724  Weight: 81.9 kg 82.6 kg    Scheduled Meds:  aspirin EC  81 mg Oral Daily   atorvastatin  80 mg Oral Daily   busPIRone  10 mg Oral TID   Chlorhexidine Gluconate Cloth  6 each Topical Daily   docusate sodium  100 mg Oral BID   enoxaparin (LOVENOX) injection  30 mg Subcutaneous Q24H   ferrous sulfate  325 mg Oral TID PC   furosemide  80 mg Intravenous BID   insulin aspart  0-9 Units Subcutaneous TID WC   melatonin  5 mg Oral QHS   metoprolol succinate  12.5 mg Oral Daily   mupirocin ointment  1 Application Nasal BID   pantoprazole  40 mg Oral Daily   senna  1 tablet Oral BID   umeclidinium-vilanterol  1 puff Inhalation Daily   Continuous Infusions:  sodium chloride Stopped (05/03/23 2144)    Nutritional status     Body mass index is 29.39 kg/m.  Data Reviewed:   CBC: Recent Labs  Lab 05/01/23 0636 05/02/23 0309 05/03/23 0318 05/04/23 0636 05/05/23 0659  WBC 8.9 9.4 12.2* 13.3* 11.4*  NEUTROABS 7.4  --   --   --   --   HGB 11.0* 10.5* 10.9* 10.1* 9.9*  HCT 35.9* 35.2* 35.0* 32.9* 32.2*  MCV 92.8 91.4 90.4 91.1 89.0  PLT 174 167 193 219 243   Basic Metabolic Panel: Recent Labs  Lab 05/01/23 0636 05/02/23 0309 05/03/23 0318 05/04/23 0636 05/05/23 0659  NA 136 136 135 135 131*  K 4.2 4.2 4.6 5.0 4.2  CL 96* 99 96* 94* 94*  CO2 30 29 27 27 26   GLUCOSE 147* 148* 154* 153* 148*  BUN 49* 53* 51* 56* 65*  CREATININE 1.66* 1.75* 1.54* 1.54* 1.57*  CALCIUM 8.9 8.6* 9.0 8.7* 8.4*  MG  --  1.7 1.7 1.8 1.8   PHOS  --  3.8  --   --   --    GFR: Estimated Creatinine Clearance: 31 mL/min (A) (by C-G formula based on SCr of 1.57 mg/dL (H)). Liver Function Tests: Recent Labs  Lab 05/01/23 0636 05/02/23 0309 05/03/23 0318  AST 24 21 16   ALT 18 17 15   ALKPHOS 58 58 56  BILITOT 0.9 0.9 1.0  PROT 6.0* 5.8* 6.1*  ALBUMIN 2.9* 2.7* 2.7*   No results for input(s): "LIPASE", "AMYLASE" in the last 168 hours. No results for input(s): "AMMONIA" in the last 168 hours. Coagulation Profile: Recent Labs  Lab 05/01/23 0636  INR 1.1   Cardiac Enzymes: No results for input(s): "CKTOTAL", "CKMB", "CKMBINDEX", "TROPONINI" in the last 168 hours. BNP (  last 3 results) No results for input(s): "PROBNP" in the last 8760 hours. HbA1C: No results for input(s): "HGBA1C" in the last 72 hours. CBG: Recent Labs  Lab 05/04/23 1216 05/04/23 1647 05/04/23 2149 05/05/23 0732 05/05/23 1205  GLUCAP 191* 172* 155* 129* 225*   Lipid Profile: No results for input(s): "CHOL", "HDL", "LDLCALC", "TRIG", "CHOLHDL", "LDLDIRECT" in the last 72 hours. Thyroid Function Tests: No results for input(s): "TSH", "T4TOTAL", "FREET4", "T3FREE", "THYROIDAB" in the last 72 hours.  Anemia Panel: No results for input(s): "VITAMINB12", "FOLATE", "FERRITIN", "TIBC", "IRON", "RETICCTPCT" in the last 72 hours. Sepsis Labs: No results for input(s): "PROCALCITON", "LATICACIDVEN" in the last 168 hours.  Recent Results (from the past 240 hour(s))  Surgical pcr screen     Status: Abnormal   Collection Time: 05/01/23  3:53 AM   Specimen: Nasal Mucosa; Nasal Swab  Result Value Ref Range Status   MRSA, PCR NEGATIVE NEGATIVE Final   Staphylococcus aureus POSITIVE (A) NEGATIVE Final    Comment: (NOTE) The Xpert SA Assay (FDA approved for NASAL specimens in patients 91 years of age and older), is one component of a comprehensive surveillance program. It is not intended to diagnose infection nor to guide or monitor  treatment. Performed at [facility], [address] [place], [place] [address]          Radiology Studies: DG HIP UNILAT W OR W/O PELVIS 2-3 VIEWS RIGHT  Result Date: 05/03/2023 CLINICAL DATA:  Status post right hip hemiarthroplasty. EXAM: DG HIP (WITH OR WITHOUT PELVIS) 2-3V RIGHT COMPARISON:  Right hip radiographs 05/01/2023 FINDINGS: Interval right hip hemiarthroplasty. No perihardware lucency is seen to indicate hardware failure or loosening. Expected postoperative subcutaneous air within the lateral right hip and thigh. Mild-to-moderate bilateral inferior sacroiliac subchondral sclerosis. Mild left femoroacetabular joint space narrowing. No acute fracture or dislocation.Moderate atherosclerotic calcifications. IMPRESSION: Interval right hip hemiarthroplasty without evidence of hardware failure or loosening. Electronically Signed   By: Neita Garnet M.D.   On: 05/03/2023 18:06           LOS: 4 days   Time spent= 25 mins    Silvano Bilis, MD [facility]  If 7PM-7AM, please contact night-coverage  05/05/2023, 2:22 PM

## 2023-05-05 NOTE — Progress Notes (Signed)
Rounding Note    Patient Name: Angela Lucero Date of Encounter: 05/05/2023  [facility] Cardiologist: Gypsy Balsam, MD   Subjective   Short of breath this AM  Inpatient Medications    Scheduled Meds:  aspirin EC  81 mg Oral Daily   atorvastatin  80 mg Oral Daily   busPIRone  10 mg Oral TID   Chlorhexidine Gluconate Cloth  6 each Topical Daily   docusate sodium  100 mg Oral BID   enoxaparin (LOVENOX) injection  30 mg Subcutaneous Q24H   ferrous sulfate  325 mg Oral TID PC   furosemide  80 mg Intravenous BID   insulin aspart  0-9 Units Subcutaneous TID WC   melatonin  5 mg Oral QHS   metoprolol succinate  12.5 mg Oral Daily   mupirocin ointment  1 Application Nasal BID   pantoprazole  40 mg Oral Daily   senna  1 tablet Oral BID   umeclidinium-vilanterol  1 puff Inhalation Daily   Continuous Infusions:  sodium chloride Stopped (05/03/23 2144)   PRN Meds: sodium chloride, acetaminophen, alum & mag hydroxide-simeth, bisacodyl, clonazePAM, dextrose, hydrALAZINE, HYDROcodone-acetaminophen, HYDROcodone-acetaminophen, HYDROmorphone (DILAUDID) injection, levalbuterol, magnesium citrate, menthol-cetylpyridinium **OR** phenol, nitroGLYCERIN, ondansetron **OR** ondansetron (ZOFRAN) IV, polyethylene glycol   Vital Signs    Vitals:   05/05/23 0432 05/05/23 0532 05/05/23 0737 05/05/23 0814  BP: 99/64 110/61 119/68 (!) 106/53  Pulse: 96  98 100  Resp: 20  17 17   Temp: 97.8 F (36.6 C)   (!) 97.4 F (36.3 C)  TempSrc: Oral     SpO2: 95%  98% 96%  Weight:        Intake/Output Summary (Last 24 hours) at 05/05/2023 1253 Last data filed at 05/04/2023 2316 Gross per 24 hour  Intake --  Output 1450 ml  Net -1450 ml      05/02/2023    7:24 AM 05/01/2023    5:00 AM 04/25/2023    3:40 PM  Last 3 Weights  Weight (lbs) 182 lb 1.6 oz 180 lb 8.9 oz 180 lb 8 oz  Weight (kg) 82.6 kg 81.9 kg 81.874 kg      Telemetry    NSR, sinus tach - Personally Reviewed  ECG       Physical Exam   GEN: No acute distress.   Neck: No JVD Cardiac: RRR, no murmurs, rubs, or gallops.  Respiratory: Clear to auscultation bilaterally. GI: Soft, nontender, non-distended  MS: No edema; No deformity. Neuro:  Nonfocal  Psych: Normal affect   Labs    High Sensitivity Troponin:  No results for input(s): "TROPONINIHS" in the last 720 hours.   Chemistry Recent Labs  Lab 05/01/23 0636 05/01/23 0636 05/02/23 0309 05/03/23 0318 05/04/23 0636 05/05/23 0659  NA 136  --  136 135 135 131*  K 4.2  --  4.2 4.6 5.0 4.2  CL 96*  --  99 96* 94* 94*  CO2 30  --  29 27 27 26   GLUCOSE 147*  --  148* 154* 153* 148*  BUN 49*  --  53* 51* 56* 65*  CREATININE 1.66*  --  1.75* 1.54* 1.54* 1.57*  CALCIUM 8.9  --  8.6* 9.0 8.7* 8.4*  MG  --    < > 1.7 1.7 1.8 1.8  PROT 6.0*  --  5.8* 6.1*  --   --   ALBUMIN 2.9*  --  2.7* 2.7*  --   --   AST 24  --  21 16  --   --  ALT 18  --  17 15  --   --   ALKPHOS 58  --  58 56  --   --   BILITOT 0.9  --  0.9 1.0  --   --   GFRNONAA 31*  --  29* 34* 34* 33*  ANIONGAP 10  --  8 12 14 11    < > = values in this interval not displayed.    Lipids No results for input(s): "CHOL", "TRIG", "HDL", "LABVLDL", "LDLCALC", "CHOLHDL" in the last 168 hours.  Hematology Recent Labs  Lab 05/03/23 0318 05/04/23 0636 05/05/23 0659  WBC 12.2* 13.3* 11.4*  RBC 3.87 3.61* 3.62*  HGB 10.9* 10.1* 9.9*  HCT 35.0* 32.9* 32.2*  MCV 90.4 91.1 89.0  MCH 28.2 28.0 27.3  MCHC 31.1 30.7 30.7  RDW 18.4* 18.1* 17.8*  PLT 193 219 243   Thyroid  Recent Labs  Lab 05/01/23 0636 05/02/23 0309  TSH 6.532*  --   FREET4  --  0.68    BNP Recent Labs  Lab 05/01/23 0636  BNP 2,534.0*    DDimer No results for input(s): "DDIMER" in the last 168 hours.   Radiology    DG HIP UNILAT W OR W/O PELVIS 2-3 VIEWS RIGHT  Result Date: 05/03/2023 CLINICAL DATA:  Status post right hip hemiarthroplasty. EXAM: DG HIP (WITH OR WITHOUT PELVIS) 2-3V RIGHT COMPARISON:   Right hip radiographs 05/01/2023 FINDINGS: Interval right hip hemiarthroplasty. No perihardware lucency is seen to indicate hardware failure or loosening. Expected postoperative subcutaneous air within the lateral right hip and thigh. Mild-to-moderate bilateral inferior sacroiliac subchondral sclerosis. Mild left femoroacetabular joint space narrowing. No acute fracture or dislocation.Moderate atherosclerotic calcifications. IMPRESSION: Interval right hip hemiarthroplasty without evidence of hardware failure or loosening. Electronically Signed   By: Neita Garnet M.D.   On: 05/03/2023 18:06    Cardiac Studies     Patient Profile     82 y.o. female with low EF and Mitral regurgitation  Assessment & Plan    Cr remains stable despite aggressive diuresis. Switch to PO tomorrow rather than today given worsening SHOB.   Baseline O2 is 2L [place] at home.  She was turned down to 1L and became [facility].  Would keep her at 2 L [place].      For questions or updates, please contact [facility] Please consult [email or web address] for contact info under        Signed, Lance Muss, MD  05/05/2023, 12:53 PM

## 2023-05-05 NOTE — [place] FL2 (Signed)
[place] MEDICAID FL2 LEVEL OF CARE FORM     IDENTIFICATION  Patient Name: Angela Lucero Birthdate: 06/03/1941 Sex: female Admission Date (Current Location): 05/01/2023  [facility] and [place] Number:  Producer, television/film/video and Address:  [facility] [place], [address], [place], [place] [address]      Provider Number: [id number]  Attending Physician Name and Address:  Kathrynn Running, MD  Relative Name and Phone Number:  Pitts,Jeffrey Son   [phone]    Current Level of Care: Hospital Recommended Level of Care: Skilled Nursing Facility Prior Approval Number:    Date Approved/Denied:   PASRR Number: [id number]  Discharge Plan: SNF    Current Diagnoses: Patient Active Problem List   Diagnosis Date Noted   Displaced fracture of right femoral neck (HCC) 05/01/2023   History of CAD (coronary artery disease) 05/01/2023   Chronic hypoxic respiratory failure (HCC) 05/01/2023   Chronic kidney disease (CKD), stage III (moderate) (HCC) 05/01/2023   Closed right hip fracture, initial encounter (HCC) 05/01/2023   GAD (generalized anxiety disorder) 03/16/2023   Cognitive communication deficit 03/16/2023   Dysphagia, oropharyngeal phase 03/16/2023   Hyperglycemia, unspecified 03/16/2023   Hyperlipidemia 03/16/2023   Muscle wasting and atrophy, not elsewhere classified, multiple sites 03/16/2023   Nicotine dependence, cigarettes, uncomplicated 03/16/2023   Nonrheumatic mitral (valve) insufficiency 03/16/2023   Other malaise 03/16/2023   Other nonthrombocytopenic purpura (HCC) 03/16/2023   Pneumonia, unspecified organism 03/16/2023   Coping style affecting medical condition 02/23/2023   Melanotic stools 02/13/2023   Anemia 02/13/2023   Debility 02/10/2023   AKI (acute kidney injury) (HCC) 02/06/2023   Acute on chronic hypoxic respiratory failure (HCC) 02/05/2023   COPD (chronic obstructive pulmonary disease) (HCC) 02/05/2023   Acute on chronic HFrEF (heart  failure with reduced ejection fraction) (HCC) 02/05/2023   Acute hypoxic respiratory failure (HCC) 02/04/2023   Hyponatremia 02/04/2023   Elevated troponin 02/04/2023   Cardiomyopathy (HCC) 01/01/2022   Chronic edema 12/18/2021   Non-ST elevation (NSTEMI) myocardial infarction (HCC) 12/14/2021   Acute systolic heart failure (HCC) 12/14/2021   Coronary artery disease PTCA and stenting in 1997 and 2005 in [place] 12/10/2021   Acute exacerbation of CHF (congestive heart failure) (HCC) 12/10/2021   Smoking 12/10/2021   Dyslipidemia 12/10/2021   Preop cardiovascular exam 12/10/2021   Flat foot 02/01/2018   Dyslipidemia associated with type 2 diabetes mellitus (HCC) 02/01/2018   Arthritis of both feet 02/01/2018    Orientation RESPIRATION BLADDER Height & Weight     Self, Time, Situation, Place  O2 Continent, Indwelling catheter Weight: 182 lb 1.6 oz (82.6 kg) Height:     BEHAVIORAL SYMPTOMS/MOOD NEUROLOGICAL BOWEL NUTRITION STATUS      Continent Diet (see discharge summary)  AMBULATORY STATUS COMMUNICATION OF NEEDS Skin   Total Care Verbally Skin abrasions, Surgical wounds                       Personal Care Assistance Level of Assistance  Bathing, Feeding, Dressing, Total care Bathing Assistance: Maximum assistance Feeding assistance: Limited assistance Dressing Assistance: Maximum assistance Total Care Assistance: Maximum assistance   Functional Limitations Info  Sight, Hearing, Speech Sight Info: Adequate Hearing Info: Adequate Speech Info: Adequate    SPECIAL CARE FACTORS FREQUENCY  PT (By licensed PT), OT (By licensed OT)     PT Frequency: 5x week OT Frequency: 5x week            Contractures Contractures Info: Not present  Additional Factors Info  Code Status, Allergies, Insulin Sliding Scale Code Status Info: DNR-limited Allergies Info: NKA   Insulin Sliding Scale Info: Novolog: see discharge summary       Current Medications (05/05/2023):   This is the current hospital active medication list Current Facility-Administered Medications  Medication Dose Route Frequency Provider Last Rate Last Admin   0.9 %  sodium chloride infusion  250 mL Intravenous PRN Corinna Capra, PA-C   Stopped at 05/03/23 2144   acetaminophen (TYLENOL) tablet 325-650 mg  325-650 mg Oral Q6H PRN Corinna Capra, PA-C       alum & mag hydroxide-simeth (MAALOX/MYLANTA) 200-200-20 MG/5ML suspension 30 mL  30 mL Oral Q4H PRN Corinna Capra, PA-C       aspirin EC tablet 81 mg  81 mg Oral Daily Corinna Capra, PA-C   81 mg at 05/05/23 0805   atorvastatin (LIPITOR) tablet 80 mg  80 mg Oral Daily Corinna Capra, PA-C   80 mg at 05/05/23 0805   bisacodyl (DULCOLAX) suppository 10 mg  10 mg Rectal Daily PRN Corinna Capra, PA-C       busPIRone (BUSPAR) tablet 10 mg  10 mg Oral TID Corinna Capra, PA-C   10 mg at 05/05/23 2595   Chlorhexidine Gluconate Cloth 2 % PADS 6 each  6 each Topical Daily Amin, Ankit C, MD   6 each at 05/05/23 0805   clonazePAM (KLONOPIN) tablet 0.5 mg  0.5 mg Oral BID PRN Corinna Capra, PA-C   0.5 mg at 05/04/23 1019   dextrose 50 % solution 50 mL  1 ampule Intravenous Daily PRN [place] Nickel A, PA-C       docusate sodium (COLACE) capsule 100 mg  100 mg Oral BID [place] Nickel A, PA-C   100 mg at 05/05/23 0804   enoxaparin (LOVENOX) injection 30 mg  30 mg Subcutaneous Q24H [place] Nickel A, PA-C   30 mg at 05/05/23 0805   ferrous sulfate tablet 325 mg  325 mg Oral TID PC Corinna Capra, PA-C   325 mg at 05/05/23 0805   furosemide (LASIX) injection 80 mg  80 mg Intravenous BID Corinna Capra, PA-C   80 mg at 05/05/23 0805   hydrALAZINE (APRESOLINE) injection 10 mg  10 mg Intravenous Q4H PRN Corinna Capra, PA-C       HYDROcodone-acetaminophen (NORCO) 7.5-325 MG per tablet 1-2 tablet  1-2 tablet Oral Q4H PRN Corinna Capra, PA-C   2 tablet at 05/05/23 0805   HYDROcodone-acetaminophen (NORCO/VICODIN) 5-325 MG per tablet 1-2 tablet  1-2 tablet  Oral Q4H PRN Corinna Capra, PA-C   2 tablet at 05/05/23 6387   HYDROmorphone (DILAUDID) injection 0.5 mg  0.5 mg Intravenous Q6H PRN Corinna Capra, PA-C   0.5 mg at 05/05/23 5643   insulin aspart (novoLOG) injection 0-9 Units  0-9 Units Subcutaneous TID WC Corinna Capra, PA-C   1 Units at 05/05/23 3295   levalbuterol (XOPENEX) nebulizer solution 0.63 mg  0.63 mg Nebulization Q6H PRN Roswell Nickel A, PA-C       magnesium citrate solution 1 Bottle  1 Bottle Oral Once PRN Corinna Capra, PA-C       melatonin tablet 5 mg  5 mg Oral QHS Roswell Nickel A, PA-C   5 mg at 05/04/23 2022   menthol-cetylpyridinium (CEPACOL) lozenge 3 mg  1 lozenge Oral PRN Corinna Capra, PA-C       Or  phenol (CHLORASEPTIC) mouth spray 1 spray  1 spray Mouth/Throat PRN Corinna Capra, PA-C       metoprolol succinate (TOPROL-XL) 24 hr tablet 12.5 mg  12.5 mg Oral Daily Roswell Nickel A, PA-C   12.5 mg at 05/04/23 0800   mupirocin ointment (BACTROBAN) 2 % 1 Application  1 Application Nasal BID Corinna Capra, PA-C   1 Application at 05/05/23 1610   nitroGLYCERIN (NITROSTAT) SL tablet 0.4 mg  0.4 mg Sublingual Q5 min PRN Corinna Capra, PA-C       ondansetron [facility]) tablet 4 mg  4 mg Oral Q6H PRN Corinna Capra, PA-C       Or   ondansetron (ZOFRAN) injection 4 mg  4 mg Intravenous Q6H PRN Corinna Capra, PA-C       pantoprazole (PROTONIX) EC tablet 40 mg  40 mg Oral Daily Roswell Nickel A, PA-C   40 mg at 05/05/23 0804   polyethylene glycol (MIRALAX / GLYCOLAX) packet 17 g  17 g Oral Daily PRN Corinna Capra, PA-C       senna (SENOKOT) tablet 8.6 mg  1 tablet Oral BID Roswell Nickel A, PA-C   8.6 mg at 05/05/23 0804   umeclidinium-vilanterol (ANORO ELLIPTA) 62.5-25 MCG/ACT 1 puff  1 puff Inhalation Daily Corinna Capra, PA-C   1 puff at 05/05/23 9604     Discharge Medications: Please see discharge summary for a list of discharge medications.  Relevant Imaging Results:  Relevant Lab  Results:   Additional Information SSN: [id number]  Lorri Frederick, LCSW

## 2023-05-05 NOTE — Progress Notes (Signed)
Physical Therapy Treatment Patient Details Name: Angela Lucero MRN: [id number] DOB: May 12, 1941 Today's Date: 05/05/2023   History of Present Illness Pt is 82 yo presenting to [facility] ED following a ground level fall while using her walker and sustaining a R hip fracture. She was found to have HF exacerbation as well and transferred to [facility]. Recently admitted to [facility] from 8/16-8/19 for NSTEMI and CHF exacerbation. Currently pt is s/p R posterior hemi hip arthroplasty. PMH: Acute exacerbation of CHF, COPD, GAD, hyperlipidemia, CAD, chronic hypoxic respiratory failure, Chronic kidney disease III.    PT Comments  Pt is slowly progressing towards goals. Currently pt is Mod A for supine to sitting and Max A for sitting to supine. Pt was Mod A for sit to stand from elevated EOB and was unable to progress gait this session due to R knee buckling with WB. Due to current functional status, home set up and available assistance at home recommending skilled physical therapy services < 3 hours/day 5x/week at a higher level of care and frequency in order to return to PLOF with decreased risk for falls, injury and re-hospitalization.     If plan is discharge home, recommend the following: A little help with walking and/or transfers;Assistance with cooking/housework;Assist for transportation;Help with stairs or ramp for entrance   Can travel by private vehicle     No  Equipment Recommendations  Other (comment) (defer to post acute)       Precautions / Restrictions Precautions Precautions: Posterior Hip Precaution Booklet Issued: No Precaution Comments: pt educated on hip precautions, re-iterated throughout session. Pt has difficutly remembering Restrictions Weight Bearing Restrictions: Yes RLE Weight Bearing: Weight bearing as tolerated     Mobility  Bed Mobility Overal bed mobility: Needs Assistance Bed Mobility: Supine to Sit, Sit to Supine     Supine to sit: Mod assist, HOB elevated, Used  rails Sit to supine: Max assist, Used rails   General bed mobility comments: Mod A for bil LE for supine to sitting with HOB up and Max A for bil LE and some assist at trunk for sitting to supine. Pt was able to control trunk into supine position. Pt was Max A for scooting up in the bed with use of rails.    Transfers Overall transfer level: Needs assistance Equipment used: Rolling walker (2 wheels) Transfers: Sit to/from Stand Sit to Stand: From elevated surface, Mod assist           General transfer comment: Mod A for sit to stand from EOB with RW and CGA for scooting back into the bed. Pt was unable to scoot up side of bed. Requires Max cuers to maintain 90 degrees or greater angle at hips to prevent breaking hip precautions.    Ambulation/Gait       General Gait Details: Attempted stepping with the RLE with small step and then LLE with R knee buckling. Pt then stated she could do no more and sat EOB.        Balance Overall balance assessment: Needs assistance Sitting-balance support: Bilateral upper extremity supported, Feet unsupported, Feet supported, Single extremity supported Sitting balance-Leahy Scale: Fair Sitting balance - Comments: Improved balance from yesterday   Standing balance support: Bilateral upper extremity supported, Reliant on assistive device for balance Standing balance-Leahy Scale: Poor Standing balance comment: Mod A to remain standing improved upright balance this session.            Cognition Arousal: Alert Behavior During Therapy: WFL for tasks assessed/performed Overall  Cognitive Status: Within Functional Limits for tasks assessed             General Comments General comments (skin integrity, edema, etc.): R hip incision no noted drainage.      Pertinent Vitals/Pain Pain Assessment Pain Assessment: Faces Faces Pain Scale: Hurts even more Breathing: occasional labored breathing, short period of hyperventilation Negative  Vocalization: occasional moan/groan, low speech, negative/disapproving quality Facial Expression: sad, frightened, frown Body Language: tense, distressed pacing, fidgeting Facial Expression: Tense Pain Location: R hip pain Pain Descriptors / Indicators: Guarding, Grimacing Pain Intervention(s): Monitored during session, Limited activity within patient's tolerance     PT Goals (current goals can now be found in the care plan section) Acute Rehab PT Goals Patient Stated Goal: Go to rehab to get stronger and return home. PT Goal Formulation: With patient Time For Goal Achievement: 05/18/23 Potential to Achieve Goals: Fair Progress towards PT goals: Progressing toward goals    Frequency    Min 1X/week      PT Plan  Discharge recommendations updated.        AM-PAC PT "6 Clicks" Mobility   Outcome Measure  Help needed turning from your back to your side while in a flat bed without using bedrails?: A Lot Help needed moving from lying on your back to sitting on the side of a flat bed without using bedrails?: A Lot Help needed moving to and from a bed to a chair (including a wheelchair)?: Total Help needed standing up from a chair using your arms (e.g., wheelchair or bedside chair)?: Total Help needed to walk in hospital room?: Total Help needed climbing 3-5 steps with a railing? : Total 6 Click Score: 8    End of Session Equipment Utilized During Treatment: Gait belt Activity Tolerance: Patient limited by pain Patient left: in bed;with call bell/phone within reach;with bed alarm set Nurse Communication: Mobility status PT Visit Diagnosis: Other abnormalities of gait and mobility (R26.89)     Time: 1610-9604 PT Time Calculation (min) (ACUTE ONLY): 39 min  Charges:    $Therapeutic Activity: 38-52 mins PT General Charges $$ ACUTE PT VISIT: 1 Visit                     Harrel Carina, DPT, CLT  Acute Rehabilitation Services Office: [phone] (Secure chat  preferred)    Claudia Desanctis 05/05/2023, 12:56 PM

## 2023-05-05 NOTE — TOC Initial Note (Addendum)
Transition of Care [facility]) - Initial/Assessment Note    Patient Details  Name: Angela Lucero MRN: [id number] Date of Birth: 07-18-1941  Transition of [facility]) CM/SW Contact:    Lorri Frederick, LCSW Phone Number: 05/05/2023, 10:32 AM  Clinical Narrative:    CSW met with pt regarding DC plan.  Discussed that PT has recommended CIR.  Per pt, she has been to both CIR and SNF in the recent past, is aware of the higher level of intensity at CIR.  Pt stating she is not able to handle CIR intensity at this time and would prefer DC to SNF.  She was at [facility] in [place] this year and would be interested in returning there.  She does have secondary insurance that helps with copays.  Pt from home alone, good support from neighbor and son. HH "just getting started" prior to admission, she believes it was [facility].  Permission given to speak with son Trey Paula and to send referral to [facility].  CSW sent referral to [facility], reached out to [facility] to review and to check how many SNF days were already used.       1130: TC [facility].  He has made bed offer.  Pt used 10 days during prior admission in July.    CSW spoke to son Trey Paula, discussed the above.  He would prefer CIR admit but understands his mother's thoughts and would be agreeable to [facility] if that is her decision.  Would like to see if she improves before making final decision.           Expected Discharge Plan: Skilled Nursing Facility Barriers to Discharge: Continued Medical Work up, SNF Pending bed offer   Patient Goals and CMS Choice Patient states their goals for this hospitalization and ongoing recovery are:: see my great grandkids again   Choice offered to / list presented to : Patient (would like to return to [facility] in [place])      Expected Discharge Plan and Services In-house Referral: Clinical Social Work   Post Acute Care Choice: Skilled Nursing Facility Living arrangements for the past 2 months: Mobile Home                                       Prior Living Arrangements/Services Living arrangements for the past 2 months: Mobile Home Lives with:: Self Patient language and need for interpreter reviewed:: Yes Do you feel safe going back to the place where you live?: Yes      Need for Family Participation in Patient Care: Yes (Comment) Care giver support system in place?: Yes (comment) Current home services: Home PT ([facility]?) Criminal Activity/Legal Involvement Pertinent to Current Situation/Hospitalization: No - Comment as needed  Activities of Daily Living      Permission Sought/Granted Permission sought to share information with : Family Supports Permission granted to share information with : Yes, Verbal Permission Granted  Share Information with NAME: son Trey Paula  Permission granted to share info w AGENCY: SNF        Emotional Assessment Appearance:: Appears stated age Attitude/Demeanor/Rapport: Engaged Affect (typically observed): Appropriate, Pleasant Orientation: : Oriented to Self, Oriented to Place, Oriented to  Time, Oriented to Situation      Admission diagnosis:  Closed right hip fracture, initial encounter [facility]) [S72.001A] Patient Active Problem List   Diagnosis Date Noted   Displaced fracture of right femoral neck (HCC) 05/01/2023  History of CAD (coronary artery disease) 05/01/2023   Chronic hypoxic respiratory failure (HCC) 05/01/2023   Chronic kidney disease (CKD), stage III (moderate) (HCC) 05/01/2023   Closed right hip fracture, initial encounter (HCC) 05/01/2023   GAD (generalized anxiety disorder) 03/16/2023   Cognitive communication deficit 03/16/2023   Dysphagia, oropharyngeal phase 03/16/2023   Hyperglycemia, unspecified 03/16/2023   Hyperlipidemia 03/16/2023   Muscle wasting and atrophy, not elsewhere classified, multiple sites 03/16/2023   Nicotine dependence, cigarettes, uncomplicated 03/16/2023   Nonrheumatic mitral (valve) insufficiency  03/16/2023   Other malaise 03/16/2023   Other nonthrombocytopenic purpura (HCC) 03/16/2023   Pneumonia, unspecified organism 03/16/2023   Coping style affecting medical condition 02/23/2023   Melanotic stools 02/13/2023   Anemia 02/13/2023   Debility 02/10/2023   AKI (acute kidney injury) (HCC) 02/06/2023   Acute on chronic hypoxic respiratory failure (HCC) 02/05/2023   COPD (chronic obstructive pulmonary disease) (HCC) 02/05/2023   Acute on chronic HFrEF (heart failure with reduced ejection fraction) (HCC) 02/05/2023   Acute hypoxic respiratory failure (HCC) 02/04/2023   Hyponatremia 02/04/2023   Elevated troponin 02/04/2023   Cardiomyopathy (HCC) 01/01/2022   Chronic edema 12/18/2021   Non-ST elevation (NSTEMI) myocardial infarction (HCC) 12/14/2021   Acute systolic heart failure (HCC) 12/14/2021   Coronary artery disease PTCA and stenting in 1997 and 2005 in [place] 12/10/2021   Acute exacerbation of CHF (congestive heart failure) (HCC) 12/10/2021   Smoking 12/10/2021   Dyslipidemia 12/10/2021   Preop cardiovascular exam 12/10/2021   Flat foot 02/01/2018   Dyslipidemia associated with type 2 diabetes mellitus (HCC) 02/01/2018   Arthritis of both feet 02/01/2018   PCP:  [name], MD Pharmacy:   [facility] [phone] [place], [place] - [address] [place] [address] Phone: [phone] Fax: [phone]  [facility] - [place], [place] - [address] [facility] [address] [place] [address] [place] [address] Phone: [phone] Fax: [phone]  [facility] [address] [place] [address] Phone: [phone] Fax: [phone]     Social Determinants of Health (SDOH) Social History: SDOH Screenings   Food Insecurity: No Food Insecurity (02/04/2023)  Housing: Low Risk  (02/04/2023)  Transportation Needs: No Transportation Needs (02/10/2023)  Utilities: Not At Risk (02/04/2023)  Alcohol Screen: Low Risk   (02/10/2023)  Financial Resource Strain: Low Risk  (02/10/2023)  Tobacco Use: Medium Risk (05/03/2023)   SDOH Interventions:     Readmission Risk Interventions     No data to display

## 2023-05-05 NOTE — Plan of Care (Signed)
  Problem: Education: Goal: Knowledge of General Education information will improve Description: Including pain rating scale, medication(s)/side effects and non-pharmacologic comfort measures Outcome: Progressing   Problem: Clinical Measurements: Goal: Ability to maintain clinical measurements within normal limits will improve Outcome: Progressing Goal: Will remain free from infection Outcome: Progressing Goal: Diagnostic test results will improve Outcome: Progressing Goal: Respiratory complications will improve Outcome: Progressing Goal: Cardiovascular complication will be avoided Outcome: Progressing   Problem: Activity: Goal: Risk for activity intolerance will decrease Outcome: Progressing   Problem: Nutrition: Goal: Adequate nutrition will be maintained Outcome: Progressing   Problem: Coping: Goal: Level of anxiety will decrease Outcome: Progressing   Problem: Skin Integrity: Goal: Risk for impaired skin integrity will decrease Outcome: Progressing   Problem: Activity: Goal: Ability to ambulate and perform ADLs will improve Outcome: Progressing

## 2023-05-06 DIAGNOSIS — S72001A Fracture of unspecified part of neck of right femur, initial encounter for closed fracture: Secondary | ICD-10-CM | POA: Diagnosis not present

## 2023-05-06 DIAGNOSIS — I5023 Acute on chronic systolic (congestive) heart failure: Secondary | ICD-10-CM | POA: Diagnosis not present

## 2023-05-06 DIAGNOSIS — J9611 Chronic respiratory failure with hypoxia: Secondary | ICD-10-CM | POA: Diagnosis not present

## 2023-05-06 LAB — BASIC METABOLIC PANEL
Anion gap: 14 (ref 5–15)
BUN: 66 mg/dL — ABNORMAL HIGH (ref 8–23)
CO2: 25 mmol/L (ref 22–32)
Calcium: 8.7 mg/dL — ABNORMAL LOW (ref 8.9–10.3)
Chloride: 93 mmol/L — ABNORMAL LOW (ref 98–111)
Creatinine, Ser: 1.45 mg/dL — ABNORMAL HIGH (ref 0.44–1.00)
GFR, Estimated: 36 mL/min — ABNORMAL LOW (ref >60)
Glucose, Bld: 199 mg/dL — ABNORMAL HIGH (ref 70–99)
Potassium: 3.9 mmol/L (ref 3.5–5.1)
Sodium: 132 mmol/L — ABNORMAL LOW (ref 135–145)

## 2023-05-06 LAB — GLUCOSE, CAPILLARY
Glucose-Capillary: 147 mg/dL — ABNORMAL HIGH (ref 70–99)
Glucose-Capillary: 151 mg/dL — ABNORMAL HIGH (ref 70–99)
Glucose-Capillary: 159 mg/dL — ABNORMAL HIGH (ref 70–99)
Glucose-Capillary: 169 mg/dL — ABNORMAL HIGH (ref 70–99)
Glucose-Capillary: 177 mg/dL — ABNORMAL HIGH (ref 70–99)
Glucose-Capillary: 199 mg/dL — ABNORMAL HIGH (ref 70–99)

## 2023-05-06 LAB — CBC
HCT: 31.2 % — ABNORMAL LOW (ref 36.0–46.0)
Hemoglobin: 9.5 g/dL — ABNORMAL LOW (ref 12.0–15.0)
MCH: 27.5 pg (ref 26.0–34.0)
MCHC: 30.4 g/dL (ref 30.0–36.0)
MCV: 90.2 fL (ref 80.0–100.0)
Platelets: 218 10*3/uL (ref 150–400)
RBC: 3.46 MIL/uL — ABNORMAL LOW (ref 3.87–5.11)
RDW: 17.5 % — ABNORMAL HIGH (ref 11.5–15.5)
WBC: 8.9 10*3/uL (ref 4.0–10.5)
nRBC: 0 % (ref 0.0–0.2)

## 2023-05-06 LAB — MAGNESIUM: Magnesium: 1.7 mg/dL (ref 1.7–2.4)

## 2023-05-06 MED ORDER — SPIRONOLACTONE 25 MG PO TABS
25.0000 mg | ORAL_TABLET | Freq: Every day | ORAL | Status: DC
  Administered 2023-05-06 – 2023-05-09 (×4): 25 mg via ORAL
  Filled 2023-05-06 (×4): qty 1

## 2023-05-06 MED ORDER — TORSEMIDE 20 MG PO TABS
60.0000 mg | ORAL_TABLET | Freq: Two times a day (BID) | ORAL | Status: DC
  Administered 2023-05-06 – 2023-05-09 (×6): 60 mg via ORAL
  Filled 2023-05-06 (×6): qty 3

## 2023-05-06 MED ORDER — MAGNESIUM SULFATE 2 GM/50ML IV SOLN
2.0000 g | Freq: Once | INTRAVENOUS | Status: AC
  Administered 2023-05-06: 2 g via INTRAVENOUS
  Filled 2023-05-06: qty 50

## 2023-05-06 MED ORDER — POTASSIUM CHLORIDE CRYS ER 20 MEQ PO TBCR
20.0000 meq | EXTENDED_RELEASE_TABLET | Freq: Once | ORAL | Status: AC
  Administered 2023-05-06: 20 meq via ORAL
  Filled 2023-05-06: qty 1

## 2023-05-06 NOTE — Progress Notes (Signed)
Rounding Note    Patient Name: Angela Lucero Date of Encounter: 05/06/2023  [facility] Cardiologist: Gypsy Balsam, MD   Subjective   Hip pain better today.  Breathing somewhat better.  Inpatient Medications    Scheduled Meds:  aspirin EC  81 mg Oral Daily   atorvastatin  80 mg Oral Daily   busPIRone  10 mg Oral TID   Chlorhexidine Gluconate Cloth  6 each Topical Daily   docusate sodium  100 mg Oral BID   enoxaparin (LOVENOX) injection  30 mg Subcutaneous Q24H   ferrous sulfate  325 mg Oral TID PC   insulin aspart  0-9 Units Subcutaneous TID WC   melatonin  5 mg Oral QHS   metoprolol succinate  12.5 mg Oral Daily   pantoprazole  40 mg Oral Daily   polyethylene glycol  34 g Oral Daily   senna  1 tablet Oral BID   spironolactone  25 mg Oral Daily   torsemide  60 mg Oral BID   umeclidinium-vilanterol  1 puff Inhalation Daily   Continuous Infusions:  sodium chloride Stopped (05/03/23 2144)   PRN Meds: sodium chloride, acetaminophen, alum & mag hydroxide-simeth, bisacodyl, clonazePAM, dextrose, hydrALAZINE, HYDROcodone-acetaminophen, HYDROcodone-acetaminophen, HYDROmorphone (DILAUDID) injection, levalbuterol, magnesium citrate, menthol-cetylpyridinium **OR** phenol, nitroGLYCERIN, ondansetron **OR** ondansetron (ZOFRAN) IV   Vital Signs    Vitals:   05/06/23 0406 05/06/23 0459 05/06/23 0809 05/06/23 0846  BP: 113/65  107/62 121/62  Pulse: (!) 108  (!) 103 95  Resp: 20  20   Temp: 99 F (37.2 C)  98.4 F (36.9 C)   TempSrc:   Oral   SpO2: 97%  99%   Weight:  85.3 kg      Intake/Output Summary (Last 24 hours) at 05/06/2023 1018 Last data filed at 05/06/2023 0900 Gross per 24 hour  Intake 240 ml  Output 950 ml  Net -710 ml      05/06/2023    4:59 AM 05/02/2023    7:24 AM 05/01/2023    5:00 AM  Last 3 Weights  Weight (lbs) 188 lb 0.8 oz 182 lb 1.6 oz 180 lb 8.9 oz  Weight (kg) 85.3 kg 82.6 kg 81.9 kg      Telemetry    Sinus tachycardia- Personally  Reviewed  ECG      Physical Exam   GEN: No acute distress.  On chronic oxygen Neck: No JVD Cardiac: RRR, no murmurs, rubs, or gallops.  Respiratory: Clear to auscultation bilaterally. GI: Soft, nontender, non-distended  MS: No edema; No deformity. Neuro:  Nonfocal  Psych: Normal affect   Labs    High Sensitivity Troponin:  No results for input(s): "TROPONINIHS" in the last 720 hours.   Chemistry Recent Labs  Lab 05/01/23 0636 05/01/23 0636 05/02/23 0309 05/03/23 0318 05/04/23 0636 05/05/23 0659  NA 136  --  136 135 135 131*  K 4.2  --  4.2 4.6 5.0 4.2  CL 96*  --  99 96* 94* 94*  CO2 30  --  29 27 27 26   GLUCOSE 147*  --  148* 154* 153* 148*  BUN 49*  --  53* 51* 56* 65*  CREATININE 1.66*  --  1.75* 1.54* 1.54* 1.57*  CALCIUM 8.9  --  8.6* 9.0 8.7* 8.4*  MG  --    < > 1.7 1.7 1.8 1.8  PROT 6.0*  --  5.8* 6.1*  --   --   ALBUMIN 2.9*  --  2.7* 2.7*  --   --  AST 24  --  21 16  --   --   ALT 18  --  17 15  --   --   ALKPHOS 58  --  58 56  --   --   BILITOT 0.9  --  0.9 1.0  --   --   GFRNONAA 31*  --  29* 34* 34* 33*  ANIONGAP 10  --  8 12 14 11    < > = values in this interval not displayed.    Lipids No results for input(s): "CHOL", "TRIG", "HDL", "LABVLDL", "LDLCALC", "CHOLHDL" in the last 168 hours.  Hematology Recent Labs  Lab 05/03/23 0318 05/04/23 0636 05/05/23 0659  WBC 12.2* 13.3* 11.4*  RBC 3.87 3.61* 3.62*  HGB 10.9* 10.1* 9.9*  HCT 35.0* 32.9* 32.2*  MCV 90.4 91.1 89.0  MCH 28.2 28.0 27.3  MCHC 31.1 30.7 30.7  RDW 18.4* 18.1* 17.8*  PLT 193 219 243   Thyroid  Recent Labs  Lab 05/01/23 0636 05/02/23 0309  TSH 6.532*  --   FREET4  --  0.68    BNP Recent Labs  Lab 05/01/23 0636  BNP 2,534.0*    DDimer No results for input(s): "DDIMER" in the last 168 hours.   Radiology    No results found.  Cardiac Studies   Low EF by echo, mitral regurgitation  Patient Profile     82 y.o. female with heart failure in the setting of hip  surgery  Assessment & Plan    Acute on chronic systolic heart failure: Change IV Lasix to home dose of oral Demadex.  Check be met in AM.  Increase spironolactone today.  Blood pressure has been somewhat soft so other heart failure meds were on hold.  Add back as BP allows.   Continue to keep her on at least 2 L nasal cannula oxygen as this is her baseline at home.      For questions or updates, please contact [facility] Please consult [email or web address] for contact info under        Signed, Lance Muss, MD  05/06/2023, 10:18 AM

## 2023-05-06 NOTE — Plan of Care (Signed)
  Problem: Education: Goal: Knowledge of General Education information will improve Description: Including pain rating scale, medication(s)/side effects and non-pharmacologic comfort measures Outcome: Progressing   Problem: Health Behavior/Discharge Planning: Goal: Ability to manage health-related needs will improve Outcome: Progressing   Problem: Clinical Measurements: Goal: Ability to maintain clinical measurements within normal limits will improve Outcome: Progressing Goal: Will remain free from infection Outcome: Progressing Goal: Diagnostic test results will improve Outcome: Progressing Goal: Respiratory complications will improve Outcome: Progressing Goal: Cardiovascular complication will be avoided Outcome: Progressing   Problem: Activity: Goal: Risk for activity intolerance will decrease Outcome: Not Progressing   Problem: Nutrition: Goal: Adequate nutrition will be maintained Outcome: Progressing   Problem: Coping: Goal: Level of anxiety will decrease Outcome: Progressing   Problem: Elimination: Goal: Will not experience complications related to bowel motility Outcome: Progressing Goal: Will not experience complications related to urinary retention Outcome: Progressing   Problem: Pain Managment: Goal: General experience of comfort will improve Outcome: Progressing   Problem: Safety: Goal: Ability to remain free from injury will improve Outcome: Progressing   Problem: Skin Integrity: Goal: Risk for impaired skin integrity will decrease Outcome: Progressing   Problem: Education: Goal: Verbalization of understanding the information provided (i.e., activity precautions, restrictions, etc) will improve Outcome: Progressing Goal: Individualized Educational Video(s) Outcome: Progressing   Problem: Activity: Goal: Ability to ambulate and perform ADLs will improve Outcome: Not Progressing   Problem: Clinical Measurements: Goal: Postoperative complications  will be avoided or minimized Outcome: Progressing   Problem: Self-Concept: Goal: Ability to maintain and perform role responsibilities to the fullest extent possible will improve Outcome: Not Progressing   Problem: Pain Management: Goal: Pain level will decrease Outcome: Progressing

## 2023-05-06 NOTE — Progress Notes (Signed)
Physical Therapy Treatment Patient Details Name: Angela Lucero MRN: [id number] DOB: Dec 03, 1940 Today's Date: 05/06/2023   History of Present Illness Pt is 82 yo presenting to [facility] ED following a ground level fall while using her walker and sustaining a R hip fracture. She was found to have HF exacerbation as well and transferred to [facility]. Recently admitted to [facility] from 8/16-8/19 for NSTEMI and CHF exacerbation. Currently pt is s/p R posterior hemi hip arthroplasty. PMH: Acute exacerbation of CHF, COPD, GAD, hyperlipidemia, CAD, chronic hypoxic respiratory failure, Chronic kidney disease III.    PT Comments  Pt continues to slowly progress towards goals. Currently pt is Mod A for supine to sitting and Max A/Total A with fatigue for sitting to supine. Pt was Mod A-Min A for sit to stand from Lowered/elevated EOB and was unable to progress gait this session due to R knee buckling with WB. Pt was able to tolerate wgt shifting practice and scoot up EOB 4-5 inches at [facility] facilitating increased RLE WB and quad strengthening. Due to current functional status, home set up and available assistance at home recommending skilled physical therapy services < 3 hours/day 5x/week at a higher level of care and frequency in order to return to PLOF with decreased risk for falls, injury and re-hospitalization.      If plan is discharge home, recommend the following: A little help with walking and/or transfers;Assistance with cooking/housework;Assist for transportation;Help with stairs or ramp for entrance   Can travel by private vehicle     No  Equipment Recommendations  Other (comment) (defer to post acute)       Precautions / Restrictions Precautions Precautions: Posterior Hip Precaution Booklet Issued: No Precaution Comments: pt educated on hip precautions, re-iterated throughout session. Pt has difficutly remembering Restrictions Weight Bearing Restrictions: Yes RLE Weight Bearing: Weight bearing  as tolerated     Mobility  Bed Mobility Overal bed mobility: Needs Assistance Bed Mobility: Supine to Sit, Sit to Supine     Supine to sit: Mod assist, HOB elevated, Used rails Sit to supine: Max assist, +2 for physical assistance   General bed mobility comments: Mod A for bil LE for supine to sitting with HOB up and Max A + 2 due to fatigue (total A) for bil LE and trunk. 2+ for fatigue was Max A (total assist) for scooting up in the bed with use of rails.    Transfers Overall transfer level: Needs assistance Equipment used: Rolling walker (2 wheels) Transfers: Sit to/from Stand Sit to Stand: From elevated surface, Mod assist, Min assist           General transfer comment: Mod A for sit to stand from EOB with RW and CGA for scooting back into the bed and 3-4 inches to the L along the side of the bed. Heavy cueing to maintain hip precautions. Pt was MIn A from elevated EOB and Mod A from lowered EOB today with verbal cues for correct hand placement.    Ambulation/Gait   Pre-gait activities: Worked on wgt shifting today. General Gait Details: Continues with buckling at the R knee with stepping.        Balance Overall balance assessment: Needs assistance Sitting-balance support: Bilateral upper extremity supported, Feet unsupported, Feet supported, Single extremity supported Sitting balance-Leahy Scale: Fair     Standing balance support: Bilateral upper extremity supported, Reliant on assistive device for balance Standing balance-Leahy Scale: Poor Standing balance comment: Mod A - Min A to remain standing improved upright balance  this session.          Cognition Arousal: Alert Behavior During Therapy: WFL for tasks assessed/performed Overall Cognitive Status: Within Functional Limits for tasks assessed             General Comments General comments (skin integrity, edema, etc.): No noted drainage today      Pertinent Vitals/Pain Pain Assessment Pain  Assessment: Faces Faces Pain Scale: Hurts even more Breathing: occasional labored breathing, short period of hyperventilation Negative Vocalization: occasional moan/groan, low speech, negative/disapproving quality Facial Expression: sad, frightened, frown Body Language: tense, distressed pacing, fidgeting Facial Expression: Tense Pain Location: R hip pain Pain Descriptors / Indicators: Guarding, Grimacing Pain Intervention(s): Monitored during session, Premedicated before session, Limited activity within patient's tolerance     PT Goals (current goals can now be found in the care plan section) Acute Rehab PT Goals Patient Stated Goal: Go to rehab to get stronger and return home. PT Goal Formulation: With patient Time For Goal Achievement: 05/18/23 Potential to Achieve Goals: Fair Progress towards PT goals: Progressing toward goals    Frequency    Min 1X/week      PT Plan  Continue with current POC       AM-PAC PT "6 Clicks" Mobility   Outcome Measure  Help needed turning from your back to your side while in a flat bed without using bedrails?: A Lot Help needed moving from lying on your back to sitting on the side of a flat bed without using bedrails?: A Lot Help needed moving to and from a bed to a chair (including a wheelchair)?: Total Help needed standing up from a chair using your arms (e.g., wheelchair or bedside chair)?: Total Help needed to walk in hospital room?: Total Help needed climbing 3-5 steps with a railing? : Total 6 Click Score: 8    End of Session Equipment Utilized During Treatment: Gait belt Activity Tolerance: Patient limited by pain Patient left: in bed;with call bell/phone within reach;with bed alarm set Nurse Communication: Mobility status PT Visit Diagnosis: Other abnormalities of gait and mobility (R26.89)     Time: 7829-5621 PT Time Calculation (min) (ACUTE ONLY): 32 min  Charges:    $Therapeutic Activity: 23-37 mins PT General  Charges $$ ACUTE PT VISIT: 1 Visit                     Harrel Carina, DPT, CLT  Acute Rehabilitation Services Office: [phone] (Secure chat preferred)    Claudia Desanctis 05/06/2023, 12:38 PM

## 2023-05-06 NOTE — Progress Notes (Signed)
PROGRESS NOTE    Angela Lucero  WUJ:[id number] DOB: 1941-08-20 DOA: 05/01/2023 PCP: [name], MD    Brief Narrative:  82 year old with history of chronic systolic congestive heart failure with known ejection fraction 20 to 25%, coronary artery disease, COPD on 2 to 3 L oxygen at home, CKD stage IIIa, stroke, hypertension, hyperlipidemia and diabetes who presented to [facility] ER after a fall and right hip fracture.  She was also found to be in CHF exacerbation so she was transferred to [facility] for cardiology and orthopedics.  Treated with IV diuresis and then eventually underwent ORIF right hip.   Assessment & Plan:   Closed traumatic right femoral neck fracture: Right hip hemiarthroplasty 9/3, Dr. Dion Saucier.  Postoperatively stable as per surgery. Pain managed with oral pain medications.  Opiates as well as stool softener and laxatives. Weightbearing as tolerated Dry dressing DVT prophylaxis Lovenox for 4 weeks.  Acute on chronic hypoxemic respiratory failure, acute on chronic congestive heart failure: Significant shortness of breath and volume overload on admission. Seen by cardiology.  She was treated with IV diuresis and today converted to oral torsemide.  She is on Toprol-XL.  Does not tolerate GDMT at this time.  Volume status improving.  Entresto on hold.  Chronic medical issues including COPD and chronic hypoxemic respiratory failure, fairly stable today.  Bronchodilators as needed.  Also on Anoro. CKD stage IIIb: At about baseline. History of coronary artery disease: Currently chest pain-free. Generalized anxiety disorder, on BuSpar and Klonopin. Hyperlipidemia, on Lipitor. Type 2 diabetes, on Accu-Cheks.   DVT prophylaxis: enoxaparin (LOVENOX) injection 30 mg Start: 05/04/23 0800 SCDs Start: 05/03/23 1804   Code Status: DNR Family Communication: Son at the bedside Disposition Plan: Status is: Inpatient Remains inpatient appropriate because: Immediate postop, needs  skilled rehab.  Anticipate tomorrow.     Consultants:  Orthopedics Cardiology  Procedures:  Right hip hemiarthroplasty  Antimicrobials:  None   Subjective: Patient seen in the morning rounds.  Hip pain is mild to moderate and controlled with oral pain medication.  She has not been able to walk much however she is motivated.  Son at the bedside.  She has some wheezing.  Son thinks her extremities are back to her usual self with some swelling.  Objective: Vitals:   05/06/23 0406 05/06/23 0459 05/06/23 0809 05/06/23 0846  BP: 113/65  107/62 121/62  Pulse: (!) 108  (!) 103 95  Resp: 20  20 18   Temp: 99 F (37.2 C)  98.4 F (36.9 C)   TempSrc:   Oral   SpO2: 97%  99%   Weight:  85.3 kg      Intake/Output Summary (Last 24 hours) at 05/06/2023 1444 Last data filed at 05/06/2023 1300 Gross per 24 hour  Intake 840 ml  Output 1550 ml  Net -710 ml   Filed Weights   05/01/23 0500 05/02/23 0724 05/06/23 0459  Weight: 81.9 kg 82.6 kg 85.3 kg    Examination:  General exam: Appears calm and comfortable  Chronically sick looking.  Frail and debilitated.  On 2 L oxygen. Respiratory system: Clear to auscultation.  Occasional conducted upper airway sounds. Cardiovascular system: S1 & S2 heard, RRR.  Trace bilateral pedal edema. Gastrointestinal system: Abdomen is nondistended, soft and nontender. No organomegaly or masses felt. Normal bowel sounds heard. Central nervous system: Alert and oriented. No focal neurological deficits. Extremities: Symmetric 5 x 5 power. Skin: Right lateral hip incision clean and dry.    Data Reviewed: I have personally  reviewed following labs and imaging studies  CBC: Recent Labs  Lab 05/01/23 0636 05/02/23 0309 05/03/23 0318 05/04/23 0636 05/05/23 0659 05/06/23 0951  WBC 8.9 9.4 12.2* 13.3* 11.4* 8.9  NEUTROABS 7.4  --   --   --   --   --   HGB 11.0* 10.5* 10.9* 10.1* 9.9* 9.5*  HCT 35.9* 35.2* 35.0* 32.9* 32.2* 31.2*  MCV 92.8 91.4 90.4  91.1 89.0 90.2  PLT 174 167 193 219 243 218   Basic Metabolic Panel: Recent Labs  Lab 05/02/23 0309 05/03/23 0318 05/04/23 0636 05/05/23 0659 05/06/23 0951  NA 136 135 135 131* 132*  K 4.2 4.6 5.0 4.2 3.9  CL 99 96* 94* 94* 93*  CO2 29 27 27 26 25   GLUCOSE 148* 154* 153* 148* 199*  BUN 53* 51* 56* 65* 66*  CREATININE 1.75* 1.54* 1.54* 1.57* 1.45*  CALCIUM 8.6* 9.0 8.7* 8.4* 8.7*  MG 1.7 1.7 1.8 1.8 1.7  PHOS 3.8  --   --   --   --    GFR: Estimated Creatinine Clearance: 34 mL/min (A) (by C-G formula based on SCr of 1.45 mg/dL (H)). Liver Function Tests: Recent Labs  Lab 05/01/23 0636 05/02/23 0309 05/03/23 0318  AST 24 21 16   ALT 18 17 15   ALKPHOS 58 58 56  BILITOT 0.9 0.9 1.0  PROT 6.0* 5.8* 6.1*  ALBUMIN 2.9* 2.7* 2.7*   No results for input(s): "LIPASE", "AMYLASE" in the last 168 hours. No results for input(s): "AMMONIA" in the last 168 hours. Coagulation Profile: Recent Labs  Lab 05/01/23 0636  INR 1.1   Cardiac Enzymes: No results for input(s): "CKTOTAL", "CKMB", "CKMBINDEX", "TROPONINI" in the last 168 hours. BNP (last 3 results) No results for input(s): "PROBNP" in the last 8760 hours. HbA1C: No results for input(s): "HGBA1C" in the last 72 hours. CBG: Recent Labs  Lab 05/05/23 1753 05/05/23 2145 05/06/23 0516 05/06/23 0810 05/06/23 1114  GLUCAP 151* 208* 147* 151* 199*   Lipid Profile: No results for input(s): "CHOL", "HDL", "LDLCALC", "TRIG", "CHOLHDL", "LDLDIRECT" in the last 72 hours. Thyroid Function Tests: No results for input(s): "TSH", "T4TOTAL", "FREET4", "T3FREE", "THYROIDAB" in the last 72 hours. Anemia Panel: No results for input(s): "VITAMINB12", "FOLATE", "FERRITIN", "TIBC", "IRON", "RETICCTPCT" in the last 72 hours. Sepsis Labs: No results for input(s): "PROCALCITON", "LATICACIDVEN" in the last 168 hours.  Recent Results (from the past 240 hour(s))  Surgical pcr screen     Status: Abnormal   Collection Time: 05/01/23  3:53  AM   Specimen: Nasal Mucosa; Nasal Swab  Result Value Ref Range Status   MRSA, PCR NEGATIVE NEGATIVE Final   Staphylococcus aureus POSITIVE (A) NEGATIVE Final    Comment: (NOTE) The Xpert SA Assay (FDA approved for NASAL specimens in patients 8 years of age and older), is one component of a comprehensive surveillance program. It is not intended to diagnose infection nor to guide or monitor treatment. Performed at [facility], [address], [place], [place] [address]          Radiology Studies: No results found.      Scheduled Meds:  aspirin EC  81 mg Oral Daily   atorvastatin  80 mg Oral Daily   busPIRone  10 mg Oral TID   Chlorhexidine Gluconate Cloth  6 each Topical Daily   docusate sodium  100 mg Oral BID   enoxaparin (LOVENOX) injection  30 mg Subcutaneous Q24H   ferrous sulfate  325 mg Oral TID  PC   insulin aspart  0-9 Units Subcutaneous TID WC   melatonin  5 mg Oral QHS   metoprolol succinate  12.5 mg Oral Daily   pantoprazole  40 mg Oral Daily   polyethylene glycol  34 g Oral Daily   senna  1 tablet Oral BID   spironolactone  25 mg Oral Daily   torsemide  60 mg Oral BID   umeclidinium-vilanterol  1 puff Inhalation Daily   Continuous Infusions:  sodium chloride Stopped (05/03/23 2144)     LOS: 5 days    Time spent: 35 minutes    Dorcas Carrow, MD [facility]

## 2023-05-06 NOTE — Progress Notes (Signed)
Attempted to remove foley cath per protocol but patient adamantly objected and voiced multiple concerns about having to get out of bed to void. I educated patient on importance of removing foley and mobilizing but patient still refuses at this time and states she will allow it in several hours.

## 2023-05-06 NOTE — Progress Notes (Signed)
     Subjective:  Patient reports pain as moderate.  Doing ok.  Difficulty ambulating, wants pain meds and anxiety meds 30 minutes before trying to get up.    Objective:   VITALS:   Vitals:   05/05/23 1533 05/05/23 1956 05/06/23 0406 05/06/23 0459  BP: 100/60 (!) 103/55 113/65   Pulse:  96 (!) 108   Resp: 19 20 20    Temp: 98.2 F (36.8 C) 98.6 F (37 C) 99 F (37.2 C)   TempSrc:      SpO2: (!) 83% 100% 97%   Weight:    85.3 kg    Neurologically intact Sensation intact distally Dorsiflexion/Plantar flexion intact Incision: no drainage   Lab Results  Component Value Date   WBC 11.4 (H) 05/05/2023   HGB 9.9 (L) 05/05/2023   HCT 32.2 (L) 05/05/2023   MCV 89.0 05/05/2023   PLT 243 05/05/2023   BMET    Component Value Date/Time   NA 131 (L) 05/05/2023 0659   NA 137 04/25/2023 1620   K 4.2 05/05/2023 0659   CL 94 (L) 05/05/2023 0659   CO2 26 05/05/2023 0659   GLUCOSE 148 (H) 05/05/2023 0659   BUN 65 (H) 05/05/2023 0659   BUN 50 (H) 04/25/2023 1620   CREATININE 1.57 (H) 05/05/2023 0659   CALCIUM 8.4 (L) 05/05/2023 0659   EGFR 34 (L) 04/25/2023 1620   GFRNONAA 33 (L) 05/05/2023 0659     Assessment/Plan: 3 Days Post-Op   Principal Problem:   Closed right hip fracture, initial encounter (HCC) Active Problems:   Acute exacerbation of CHF (congestive heart failure) (HCC)   COPD (chronic obstructive pulmonary disease) (HCC)   GAD (generalized anxiety disorder)   Hyperlipidemia   Displaced fracture of right femoral neck (HCC)   History of CAD (coronary artery disease)   Chronic hypoxic respiratory failure (HCC)   Chronic kidney disease (CKD), stage III (moderate) (HCC)   Advance diet Up with therapy Discharge to SNF ok from ortho standpoint.    Weightbearing: WBAT LLE Insicional and dressing care: Dressings left intact until follow-up Orthopedic device(s): None Showering: sponge bath, keep wound dry, ok to show if able to cover dressing VTE  prophylaxis:  Lovenox 30mg    3 weeks  with aspirin Pain control: hydrocodone Follow - up plan: 2 weeks Contact information:  Teryl Lucy MD   Anticipated LOS equal to or greater than 2 midnights due to - Age 81 and older with one or more of the following:  - Obesity  - Expected need for hospital services (PT, OT, Nursing) required for safe  discharge  - Anticipated need for postoperative skilled nursing care or inpatient rehab  - Active co-morbidities: Coronary Artery Disease and Heart Failure  Please call with additional questions.  Eulas Post 05/06/2023, 6:51 AM   Teryl Lucy, MD Cell [phone]

## 2023-05-06 NOTE — Progress Notes (Signed)
Pt was not ready to remove foley cath d/t weak mobility. Pt states that she will allow me to remove it in the am. Will reinforce removal at end of shift. Pt was educated.

## 2023-05-06 NOTE — TOC Progression Note (Signed)
Per MD, possible dc tomorrow. Updated Jomarie Longs with [facility] [phone]) who confirmed they are prepared to admit pt over weekend. SW will follow.   Dellie Burns, MSW, LCSW [phone] (coverage)

## 2023-05-07 DIAGNOSIS — I420 Dilated cardiomyopathy: Secondary | ICD-10-CM

## 2023-05-07 DIAGNOSIS — J9611 Chronic respiratory failure with hypoxia: Secondary | ICD-10-CM | POA: Diagnosis not present

## 2023-05-07 DIAGNOSIS — I340 Nonrheumatic mitral (valve) insufficiency: Secondary | ICD-10-CM | POA: Diagnosis not present

## 2023-05-07 DIAGNOSIS — S72001A Fracture of unspecified part of neck of right femur, initial encounter for closed fracture: Secondary | ICD-10-CM | POA: Diagnosis not present

## 2023-05-07 DIAGNOSIS — I5023 Acute on chronic systolic (congestive) heart failure: Secondary | ICD-10-CM | POA: Diagnosis not present

## 2023-05-07 LAB — GLUCOSE, CAPILLARY
Glucose-Capillary: 134 mg/dL — ABNORMAL HIGH (ref 70–99)
Glucose-Capillary: 212 mg/dL — ABNORMAL HIGH (ref 70–99)
Glucose-Capillary: 222 mg/dL — ABNORMAL HIGH (ref 70–99)
Glucose-Capillary: 154 mg/dL — ABNORMAL HIGH (ref 70–99)

## 2023-05-07 LAB — BASIC METABOLIC PANEL
Anion gap: 12 (ref 5–15)
BUN: 70 mg/dL — ABNORMAL HIGH (ref 8–23)
CO2: 27 mmol/L (ref 22–32)
Calcium: 8.7 mg/dL — ABNORMAL LOW (ref 8.9–10.3)
Chloride: 94 mmol/L — ABNORMAL LOW (ref 98–111)
Creatinine, Ser: 1.27 mg/dL — ABNORMAL HIGH (ref 0.44–1.00)
GFR, Estimated: 43 mL/min — ABNORMAL LOW (ref >60)
Glucose, Bld: 149 mg/dL — ABNORMAL HIGH (ref 70–99)
Potassium: 4.3 mmol/L (ref 3.5–5.1)
Sodium: 133 mmol/L — ABNORMAL LOW (ref 135–145)

## 2023-05-07 LAB — MAGNESIUM: Magnesium: 2.1 mg/dL (ref 1.7–2.4)

## 2023-05-07 MED ORDER — HYDROMORPHONE HCL 1 MG/ML IJ SOLN
0.5000 mg | INTRAMUSCULAR | Status: DC | PRN
  Administered 2023-05-07 – 2023-05-08 (×4): 0.5 mg via INTRAVENOUS
  Filled 2023-05-07 (×4): qty 0.5

## 2023-05-07 MED ORDER — CLONAZEPAM 0.5 MG PO TABS: 0.5000 mg | ORAL_TABLET | Freq: Two times a day (BID) | ORAL | 0 refills | Status: DC | PRN

## 2023-05-07 MED ORDER — CARVEDILOL 6.25 MG PO TABS
6.2500 mg | ORAL_TABLET | Freq: Two times a day (BID) | ORAL | Status: DC
  Administered 2023-05-07 – 2023-05-09 (×4): 6.25 mg via ORAL
  Filled 2023-05-07 (×4): qty 1

## 2023-05-07 NOTE — Progress Notes (Signed)
Rounding Note    Patient Name: Angela Lucero Date of Encounter: 05/07/2023  [facility] Cardiologist: Gypsy Balsam, MD   Subjective   Tachypnic due to pain in hip/knee  Inpatient Medications    Scheduled Meds:  aspirin EC  81 mg Oral Daily   atorvastatin  80 mg Oral Daily   busPIRone  10 mg Oral TID   Chlorhexidine Gluconate Cloth  6 each Topical Daily   docusate sodium  100 mg Oral BID   enoxaparin (LOVENOX) injection  30 mg Subcutaneous Q24H   ferrous sulfate  325 mg Oral TID PC   insulin aspart  0-9 Units Subcutaneous TID WC   melatonin  5 mg Oral QHS   metoprolol succinate  12.5 mg Oral Daily   pantoprazole  40 mg Oral Daily   polyethylene glycol  34 g Oral Daily   senna  1 tablet Oral BID   spironolactone  25 mg Oral Daily   torsemide  60 mg Oral BID   umeclidinium-vilanterol  1 puff Inhalation Daily   Continuous Infusions:  sodium chloride Stopped (05/03/23 2144)   PRN Meds: sodium chloride, acetaminophen, alum & mag hydroxide-simeth, bisacodyl, clonazePAM, dextrose, hydrALAZINE, HYDROcodone-acetaminophen, HYDROcodone-acetaminophen, levalbuterol, magnesium citrate, menthol-cetylpyridinium **OR** phenol, nitroGLYCERIN, ondansetron **OR** ondansetron (ZOFRAN) IV   Vital Signs    Vitals:   05/07/23 0500 05/07/23 0543 05/07/23 0808 05/07/23 0810  BP:  111/72  (!) 99/54  Pulse:  96  (!) 101  Resp:  (!) 21  20  Temp:  97.9 F (36.6 C)  98.2 F (36.8 C)  TempSrc:  Oral  Oral  SpO2:  97% 97% 96%  Weight: 80.6 kg       Intake/Output Summary (Last 24 hours) at 05/07/2023 0828 Last data filed at 05/06/2023 1524 Gross per 24 hour  Intake 650 ml  Output 800 ml  Net -150 ml      05/07/2023    5:00 AM 05/06/2023    4:59 AM 05/02/2023    7:24 AM  Last 3 Weights  Weight (lbs) 177 lb 11.1 oz 188 lb 0.8 oz 182 lb 1.6 oz  Weight (kg) 80.6 kg 85.3 kg 82.6 kg      Telemetry    Sinus tachycardia- Personally Reviewed  ECG    05/02/23 ST RBBB   Physical  Exam   Elderly female Lungs clear Apical MR murmur  Abdomen benign Post right hip surgery   Labs    High Sensitivity Troponin:  No results for input(s): "TROPONINIHS" in the last 720 hours.   Chemistry Recent Labs  Lab 05/01/23 0636 05/01/23 0636 05/02/23 0309 05/03/23 1610 05/04/23 0636 05/05/23 0659 05/06/23 0951 05/07/23 0642  NA 136  --  136 135   < > 131* 132* 133*  K 4.2  --  4.2 4.6   < > 4.2 3.9 4.3  CL 96*  --  99 96*   < > 94* 93* 94*  CO2 30  --  29 27   < > 26 25 27   GLUCOSE 147*  --  148* 154*   < > 148* 199* 149*  BUN 49*  --  53* 51*   < > 65* 66* 70*  CREATININE 1.66*  --  1.75* 1.54*   < > 1.57* 1.45* 1.27*  CALCIUM 8.9  --  8.6* 9.0   < > 8.4* 8.7* 8.7*  MG  --    < > 1.7 1.7   < > 1.8 1.7 2.1  PROT 6.0*  --  5.8* 6.1*  --   --   --   --   ALBUMIN 2.9*  --  2.7* 2.7*  --   --   --   --   AST 24  --  21 16  --   --   --   --   ALT 18  --  17 15  --   --   --   --   ALKPHOS 58  --  58 56  --   --   --   --   BILITOT 0.9  --  0.9 1.0  --   --   --   --   GFRNONAA 31*  --  29* 34*   < > 33* 36* 43*  ANIONGAP 10  --  8 12   < > 11 14 12    < > = values in this interval not displayed.    Lipids No results for input(s): "CHOL", "TRIG", "HDL", "LABVLDL", "LDLCALC", "CHOLHDL" in the last 168 hours.  Hematology Recent Labs  Lab 05/04/23 0636 05/05/23 0659 05/06/23 0951  WBC 13.3* 11.4* 8.9  RBC 3.61* 3.62* 3.46*  HGB 10.1* 9.9* 9.5*  HCT 32.9* 32.2* 31.2*  MCV 91.1 89.0 90.2  MCH 28.0 27.3 27.5  MCHC 30.7 30.7 30.4  RDW 18.1* 17.8* 17.5*  PLT 219 243 218   Thyroid  Recent Labs  Lab 05/01/23 0636 05/02/23 0309  TSH 6.532*  --   FREET4  --  0.68    BNP Recent Labs  Lab 05/01/23 0636  BNP 2,534.0*    DDimer No results for input(s): "DDIMER" in the last 168 hours.   Radiology    No results found.  Cardiac Studies   Low EF by echo, mitral regurgitation  Patient Profile     82 y.o. female with heart failure in the setting of hip  surgery TTE with EF 20-25%   Assessment & Plan    Acute on chronic systolic heart failure: Volume ok needs better pain control as this makes her tachycardic EF 20-25% K 4.3 Cr 1.27 stable Change Toprol to bid coreg in setting of CHF and tachycardia. BP soft Hope to add ARB prior to d/c  Continue aldactone and demedex. Poor prognosis given age severely reduced EF and severe MR   Continue to keep her on at least 2 L nasal cannula oxygen as this is her baseline at home.Quit smoking 2 months ago       For questions or updates, please contact [facility] Please consult [email or web address] for contact info under        Signed, Charlton Haws, MD  05/07/2023, 8:28 AM

## 2023-05-07 NOTE — Progress Notes (Signed)
Pt allowed me to remove foley. Tolerated well, output 1200. Pt d/t void

## 2023-05-07 NOTE — Plan of Care (Signed)

## 2023-05-07 NOTE — Plan of Care (Signed)
Patient ID: Angela Lucero, female   DOB: [birth year], 82 y.o.   MRN: [id number]  Problem: Education: Goal: Knowledge of General Education information will improve Description: Including pain rating scale, medication(s)/side effects and non-pharmacologic comfort measures Outcome: Progressing   Problem: Health Behavior/Discharge Planning: Goal: Ability to manage health-related needs will improve Outcome: Progressing   Problem: Clinical Measurements: Goal: Ability to maintain clinical measurements within normal limits will improve Outcome: Progressing Goal: Will remain free from infection Outcome: Progressing Goal: Diagnostic test results will improve Outcome: Progressing Goal: Respiratory complications will improve Outcome: Progressing Goal: Cardiovascular complication will be avoided Outcome: Progressing   Problem: Activity: Goal: Risk for activity intolerance will decrease Outcome: Progressing   Problem: Nutrition: Goal: Adequate nutrition will be maintained Outcome: Progressing   Problem: Coping: Goal: Level of anxiety will decrease Outcome: Progressing   Problem: Elimination: Goal: Will not experience complications related to bowel motility Outcome: Progressing Goal: Will not experience complications related to urinary retention Outcome: Progressing   Problem: Pain Managment: Goal: General experience of comfort will improve Outcome: Progressing   Problem: Safety: Goal: Ability to remain free from injury will improve Outcome: Progressing   Problem: Skin Integrity: Goal: Risk for impaired skin integrity will decrease Outcome: Progressing   Problem: Education: Goal: Verbalization of understanding the information provided (i.e., activity precautions, restrictions, etc) will improve Outcome: Progressing Goal: Individualized Educational Video(s) Outcome: Progressing   Problem: Activity: Goal: Ability to ambulate and perform ADLs will improve Outcome: Progressing    Problem: Clinical Measurements: Goal: Postoperative complications will be avoided or minimized Outcome: Progressing   Problem: Self-Concept: Goal: Ability to maintain and perform role responsibilities to the fullest extent possible will improve Outcome: Progressing   Problem: Pain Management: Goal: Pain level will decrease Outcome: Progressing    Lidia Collum, RN

## 2023-05-07 NOTE — Progress Notes (Signed)
PROGRESS NOTE    Angela Lucero  ZOX:[id number] DOB: 1941/08/21 DOA: 05/01/2023 PCP: [name], MD    Brief Narrative:  82 year old with history of chronic systolic congestive heart failure with known ejection fraction 20 to 25%, coronary artery disease, COPD on 2 to 3 L oxygen at home, CKD stage IIIa, stroke, hypertension, hyperlipidemia and diabetes who presented to [facility] ER after a fall and right hip fracture.  She was also found to be in CHF exacerbation so she was transferred to [facility] for cardiology and orthopedics.  Treated with IV diuresis and then eventually underwent ORIF right hip.   Assessment & Plan:   Closed traumatic right femoral neck fracture: Right hip hemiarthroplasty 9/3, Dr. Dion Saucier.  Postoperatively stable as per surgery. Pain managed with oral pain medications.  Opiates as well as stool softener and laxatives. Weightbearing as tolerated Dry dressing DVT prophylaxis Lovenox for 4 weeks.  Acute on chronic hypoxemic respiratory failure, acute on chronic congestive heart failure: Significant shortness of breath and volume overload on admission. Seen by cardiology.  She was treated with IV diuresis and today converted to oral torsemide.  Now on carvedilol.  Spironolactone. Volume status improving.  Entresto on hold.  Chronic medical issues including COPD and chronic hypoxemic respiratory failure, fairly stable today.  Bronchodilators as needed.  Also on Anoro. CKD stage IIIb: At about baseline. History of coronary artery disease: Currently chest pain-free. Generalized anxiety disorder, on BuSpar and Klonopin. Hyperlipidemia, on Lipitor. Type 2 diabetes, on Accu-Cheks.  Still with significant pain and difficulty mobility.  Transferred to a SNF anticipate next 24 to 48 hours.   DVT prophylaxis: enoxaparin (LOVENOX) injection 30 mg Start: 05/04/23 0800 SCDs Start: 05/03/23 1804   Code Status: DNR Family Communication: None today. Disposition Plan: Status is:  Inpatient Remains inpatient appropriate because: Recovering from surgery.  Will need SNF rehab.     Consultants:  Orthopedics Cardiology  Procedures:  Right hip hemiarthroplasty  Antimicrobials:  None   Subjective:  Patient seen and examined in the morning rounds.  She is uncomfortable today due to difficulty positioning herself from hip pain and knee pain.  Denies any shortness of breath.  Objective: Vitals:   05/07/23 0808 05/07/23 0810 05/07/23 0828 05/07/23 0900  BP:  (!) 99/54 113/65   Pulse:  (!) 101    Resp:  20 19   Temp:  98.2 F (36.8 C)  98 F (36.7 C)  TempSrc:  Oral    SpO2: 97% 96%    Weight:        Intake/Output Summary (Last 24 hours) at 05/07/2023 1430 Last data filed at 05/07/2023 1010 Gross per 24 hour  Intake 290 ml  Output 400 ml  Net -110 ml   Filed Weights   05/02/23 0724 05/06/23 0459 05/07/23 0500  Weight: 82.6 kg 85.3 kg 80.6 kg    Examination:  General exam: Appears calm and comfortable, mildly anxious.  On 2 L of oxygen. Respiratory system: Clear to auscultation.  Occasional conducted upper airway sounds. Cardiovascular system: S1 & S2 heard, RRR.  Trace bilateral pedal edema. Gastrointestinal system: Abdomen is nondistended, soft and nontender. No organomegaly or masses felt. Normal bowel sounds heard. Central nervous system: Alert and oriented. No focal neurological deficits. Extremities: Symmetric 5 x 5 power. Skin: Right lateral hip incision clean and dry.    Data Reviewed: I have personally reviewed following labs and imaging studies  CBC: Recent Labs  Lab 05/01/23 0636 05/02/23 0309 05/03/23 8119 05/04/23 1478 05/05/23 2956  05/06/23 0951  WBC 8.9 9.4 12.2* 13.3* 11.4* 8.9  NEUTROABS 7.4  --   --   --   --   --   HGB 11.0* 10.5* 10.9* 10.1* 9.9* 9.5*  HCT 35.9* 35.2* 35.0* 32.9* 32.2* 31.2*  MCV 92.8 91.4 90.4 91.1 89.0 90.2  PLT 174 167 193 219 243 218   Basic Metabolic Panel: Recent Labs  Lab 05/02/23 0309  05/03/23 0318 05/04/23 0636 05/05/23 0659 05/06/23 0951 05/07/23 0642  NA 136 135 135 131* 132* 133*  K 4.2 4.6 5.0 4.2 3.9 4.3  CL 99 96* 94* 94* 93* 94*  CO2 29 27 27 26 25 27   GLUCOSE 148* 154* 153* 148* 199* 149*  BUN 53* 51* 56* 65* 66* 70*  CREATININE 1.75* 1.54* 1.54* 1.57* 1.45* 1.27*  CALCIUM 8.6* 9.0 8.7* 8.4* 8.7* 8.7*  MG 1.7 1.7 1.8 1.8 1.7 2.1  PHOS 3.8  --   --   --   --   --    GFR: Estimated Creatinine Clearance: 37.8 mL/min (A) (by C-G formula based on SCr of 1.27 mg/dL (H)). Liver Function Tests: Recent Labs  Lab 05/01/23 0636 05/02/23 0309 05/03/23 0318  AST 24 21 16   ALT 18 17 15   ALKPHOS 58 58 56  BILITOT 0.9 0.9 1.0  PROT 6.0* 5.8* 6.1*  ALBUMIN 2.9* 2.7* 2.7*   No results for input(s): "LIPASE", "AMYLASE" in the last 168 hours. No results for input(s): "AMMONIA" in the last 168 hours. Coagulation Profile: Recent Labs  Lab 05/01/23 0636  INR 1.1   Cardiac Enzymes: No results for input(s): "CKTOTAL", "CKMB", "CKMBINDEX", "TROPONINI" in the last 168 hours. BNP (last 3 results) No results for input(s): "PROBNP" in the last 8760 hours. HbA1C: No results for input(s): "HGBA1C" in the last 72 hours. CBG: Recent Labs  Lab 05/06/23 2011 05/06/23 2353 05/07/23 0407 05/07/23 0817 05/07/23 1222  GLUCAP 177* 159* 134* 212* 222*   Lipid Profile: No results for input(s): "CHOL", "HDL", "LDLCALC", "TRIG", "CHOLHDL", "LDLDIRECT" in the last 72 hours. Thyroid Function Tests: No results for input(s): "TSH", "T4TOTAL", "FREET4", "T3FREE", "THYROIDAB" in the last 72 hours. Anemia Panel: No results for input(s): "VITAMINB12", "FOLATE", "FERRITIN", "TIBC", "IRON", "RETICCTPCT" in the last 72 hours. Sepsis Labs: No results for input(s): "PROCALCITON", "LATICACIDVEN" in the last 168 hours.  Recent Results (from the past 240 hour(s))  Surgical pcr screen     Status: Abnormal   Collection Time: 05/01/23  3:53 AM   Specimen: Nasal Mucosa; Nasal Swab   Result Value Ref Range Status   MRSA, PCR NEGATIVE NEGATIVE Final   Staphylococcus aureus POSITIVE (A) NEGATIVE Final    Comment: (NOTE) The Xpert SA Assay (FDA approved for NASAL specimens in patients 11 years of age and older), is one component of a comprehensive surveillance program. It is not intended to diagnose infection nor to guide or monitor treatment. Performed at [facility], [address], [place], [place] [address]          Radiology Studies: No results found.      Scheduled Meds:  aspirin EC  81 mg Oral Daily   atorvastatin  80 mg Oral Daily   busPIRone  10 mg Oral TID   carvedilol  6.25 mg Oral BID WC   Chlorhexidine Gluconate Cloth  6 each Topical Daily   docusate sodium  100 mg Oral BID   enoxaparin (LOVENOX) injection  30 mg Subcutaneous Q24H   ferrous sulfate  325 mg Oral  TID PC   insulin aspart  0-9 Units Subcutaneous TID WC   melatonin  5 mg Oral QHS   pantoprazole  40 mg Oral Daily   polyethylene glycol  34 g Oral Daily   senna  1 tablet Oral BID   spironolactone  25 mg Oral Daily   torsemide  60 mg Oral BID   umeclidinium-vilanterol  1 puff Inhalation Daily   Continuous Infusions:  sodium chloride Stopped (05/03/23 2144)     LOS: 6 days    Time spent: 35 minutes    Dorcas Carrow, MD [facility]

## 2023-05-08 DIAGNOSIS — S72001A Fracture of unspecified part of neck of right femur, initial encounter for closed fracture: Secondary | ICD-10-CM | POA: Diagnosis not present

## 2023-05-08 DIAGNOSIS — I340 Nonrheumatic mitral (valve) insufficiency: Secondary | ICD-10-CM | POA: Diagnosis not present

## 2023-05-08 DIAGNOSIS — I5022 Chronic systolic (congestive) heart failure: Secondary | ICD-10-CM | POA: Diagnosis not present

## 2023-05-08 DIAGNOSIS — J9611 Chronic respiratory failure with hypoxia: Secondary | ICD-10-CM | POA: Diagnosis not present

## 2023-05-08 DIAGNOSIS — I5023 Acute on chronic systolic (congestive) heart failure: Secondary | ICD-10-CM | POA: Diagnosis not present

## 2023-05-08 LAB — GLUCOSE, CAPILLARY
Glucose-Capillary: 195 mg/dL — ABNORMAL HIGH (ref 70–99)
Glucose-Capillary: 164 mg/dL — ABNORMAL HIGH (ref 70–99)
Glucose-Capillary: 124 mg/dL — ABNORMAL HIGH (ref 70–99)

## 2023-05-08 LAB — MAGNESIUM: Magnesium: 2 mg/dL (ref 1.7–2.4)

## 2023-05-08 NOTE — Plan of Care (Signed)
  Problem: Education: Goal: Knowledge of General Education information will improve Description: Including pain rating scale, medication(s)/side effects and non-pharmacologic comfort measures Outcome: Not Progressing   Problem: Health Behavior/Discharge Planning: Goal: Ability to manage health-related needs will improve Outcome: Not Progressing   Problem: Clinical Measurements: Goal: Ability to maintain clinical measurements within normal limits will improve Outcome: Not Progressing Goal: Will remain free from infection Outcome: Not Progressing Goal: Diagnostic test results will improve Outcome: Not Progressing Goal: Respiratory complications will improve Outcome: Not Progressing Goal: Cardiovascular complication will be avoided Outcome: Not Progressing   Problem: Activity: Goal: Risk for activity intolerance will decrease Outcome: Not Progressing   Problem: Nutrition: Goal: Adequate nutrition will be maintained Outcome: Not Progressing   Problem: Coping: Goal: Level of anxiety will decrease Outcome: Not Progressing   Problem: Elimination: Goal: Will not experience complications related to bowel motility Outcome: Not Progressing Goal: Will not experience complications related to urinary retention Outcome: Not Progressing   Problem: Pain Managment: Goal: General experience of comfort will improve Outcome: Not Progressing   Problem: Safety: Goal: Ability to remain free from injury will improve Outcome: Not Progressing   Problem: Skin Integrity: Goal: Risk for impaired skin integrity will decrease Outcome: Not Progressing   Problem: Education: Goal: Verbalization of understanding the information provided (i.e., activity precautions, restrictions, etc) will improve Outcome: Not Progressing Goal: Individualized Educational Video(s) Outcome: Not Progressing   Problem: Activity: Goal: Ability to ambulate and perform ADLs will improve Outcome: Not Progressing    Problem: Clinical Measurements: Goal: Postoperative complications will be avoided or minimized Outcome: Not Progressing   Problem: Self-Concept: Goal: Ability to maintain and perform role responsibilities to the fullest extent possible will improve Outcome: Not Progressing   Problem: Pain Management: Goal: Pain level will decrease Outcome: Not Progressing   

## 2023-05-08 NOTE — Progress Notes (Signed)
Rounding Note    Patient Name: Angela Lucero Date of Encounter: 05/08/2023  [facility] Cardiologist: Gypsy Balsam, MD   Subjective   Less dyspnea Complains of more pain and hyperventilates after being in room   Inpatient Medications    Scheduled Meds:  aspirin EC  81 mg Oral Daily   atorvastatin  80 mg Oral Daily   busPIRone  10 mg Oral TID   carvedilol  6.25 mg Oral BID WC   Chlorhexidine Gluconate Cloth  6 each Topical Daily   docusate sodium  100 mg Oral BID   enoxaparin (LOVENOX) injection  30 mg Subcutaneous Q24H   ferrous sulfate  325 mg Oral TID PC   insulin aspart  0-9 Units Subcutaneous TID WC   melatonin  5 mg Oral QHS   pantoprazole  40 mg Oral Daily   polyethylene glycol  34 g Oral Daily   senna  1 tablet Oral BID   spironolactone  25 mg Oral Daily   torsemide  60 mg Oral BID   umeclidinium-vilanterol  1 puff Inhalation Daily   Continuous Infusions:  sodium chloride Stopped (05/03/23 2144)   PRN Meds: sodium chloride, acetaminophen, alum & mag hydroxide-simeth, bisacodyl, clonazePAM, dextrose, hydrALAZINE, HYDROcodone-acetaminophen, HYDROcodone-acetaminophen, HYDROmorphone (DILAUDID) injection, levalbuterol, magnesium citrate, menthol-cetylpyridinium **OR** phenol, nitroGLYCERIN, ondansetron **OR** ondansetron (ZOFRAN) IV   Vital Signs    Vitals:   05/08/23 0500 05/08/23 0549 05/08/23 0726 05/08/23 0755  BP: (!) 110/58 (!) 112/52  (!) 102/54  Pulse: 88 94  93  Resp:  20  18  Temp: 98.5 F (36.9 C) 98 F (36.7 C)  98.9 F (37.2 C)  TempSrc: Oral Oral    SpO2: 99% 98%  100%  Weight:   80.6 kg     Intake/Output Summary (Last 24 hours) at 05/08/2023 0927 Last data filed at 05/07/2023 1557 Gross per 24 hour  Intake 120 ml  Output 475 ml  Net -355 ml      05/08/2023    7:26 AM 05/07/2023    5:00 AM 05/06/2023    4:59 AM  Last 3 Weights  Weight (lbs) 177 lb 11.1 oz 177 lb 11.1 oz 188 lb 0.8 oz  Weight (kg) 80.6 kg 80.6 kg 85.3 kg       Telemetry    Sinus tachycardia- Personally Reviewed  ECG    05/02/23 ST RBBB   Physical Exam   Elderly female Lungs clear Apical MR murmur  Abdomen benign Post right hip surgery   Labs    High Sensitivity Troponin:  No results for input(s): "TROPONINIHS" in the last 720 hours.   Chemistry Recent Labs  Lab 05/02/23 0309 05/03/23 0318 05/04/23 0636 05/05/23 0659 05/06/23 0951 05/07/23 0642 05/08/23 0434  NA 136 135   < > 131* 132* 133*  --   K 4.2 4.6   < > 4.2 3.9 4.3  --   CL 99 96*   < > 94* 93* 94*  --   CO2 29 27   < > 26 25 27   --   GLUCOSE 148* 154*   < > 148* 199* 149*  --   BUN 53* 51*   < > 65* 66* 70*  --   CREATININE 1.75* 1.54*   < > 1.57* 1.45* 1.27*  --   CALCIUM 8.6* 9.0   < > 8.4* 8.7* 8.7*  --   MG 1.7 1.7   < > 1.8 1.7 2.1 2.0  PROT 5.8* 6.1*  --   --   --   --   --  ALBUMIN 2.7* 2.7*  --   --   --   --   --   AST 21 16  --   --   --   --   --   ALT 17 15  --   --   --   --   --   ALKPHOS 58 56  --   --   --   --   --   BILITOT 0.9 1.0  --   --   --   --   --   GFRNONAA 29* 34*   < > 33* 36* 43*  --   ANIONGAP 8 12   < > 11 14 12   --    < > = values in this interval not displayed.    Lipids No results for input(s): "CHOL", "TRIG", "HDL", "LABVLDL", "LDLCALC", "CHOLHDL" in the last 168 hours.  Hematology Recent Labs  Lab 05/04/23 0636 05/05/23 0659 05/06/23 0951  WBC 13.3* 11.4* 8.9  RBC 3.61* 3.62* 3.46*  HGB 10.1* 9.9* 9.5*  HCT 32.9* 32.2* 31.2*  MCV 91.1 89.0 90.2  MCH 28.0 27.3 27.5  MCHC 30.7 30.7 30.4  RDW 18.1* 17.8* 17.5*  PLT 219 243 218   Thyroid  Recent Labs  Lab 05/02/23 0309  FREET4 0.68    BNP No results for input(s): "BNP", "PROBNP" in the last 168 hours.   DDimer No results for input(s): "DDIMER" in the last 168 hours.   Radiology    No results found.  Cardiac Studies   Low EF by echo, mitral regurgitation  Patient Profile     82 y.o. female with heart failure in the setting of hip surgery TTE  with EF 20-25%   Assessment & Plan    Acute on chronic systolic heart failure: Volume ok needs better pain control as this makes her tachycardic EF 20-25% K 4.3 Cr 1.27 stable Change Toprol to bid coreg in setting of CHF and tachycardia. BP soft Hope to add ARB prior to d/c  Continue aldactone and demedex. Poor prognosis given age severely reduced EF and severe MR   Continue to keep her on at least 2 L nasal cannula oxygen as this is her baseline at home.Quit smoking 2 months ago       For questions or updates, please contact [facility] Please consult [email or web address] for contact info under        Signed, Charlton Haws, MD  05/08/2023, 9:27 AM

## 2023-05-08 NOTE — Progress Notes (Signed)
PROGRESS NOTE    Angela Lucero  LOV:[id number] DOB: 1941-03-04 DOA: 05/01/2023 PCP: [name], MD    Brief Narrative:  82 year old with history of chronic systolic congestive heart failure with known ejection fraction 20 to 25%, coronary artery disease, COPD on 2 to 3 L oxygen at home, CKD stage IIIa, stroke, hypertension, hyperlipidemia and diabetes who presented to [facility] ER after a fall and right hip fracture.  She was also found to be in CHF exacerbation so she was transferred to [facility] for cardiology and orthopedics.  Treated with IV diuresis and then eventually underwent ORIF right hip. Remains in the hospital, persistent shortness of breath and difficulty managing pain.   Assessment & Plan:   Closed traumatic right femoral neck fracture: Right hip hemiarthroplasty 9/3, Dr. Dion Saucier.  Postoperatively stable as per surgery. Pain managed with oral pain medications.  Opiates as well as stool softener and laxatives. Weightbearing as tolerated Dry dressing DVT prophylaxis Lovenox for 4 weeks.  Acute on chronic hypoxemic respiratory failure, acute on chronic congestive heart failure: Significant shortness of breath and volume overload on admission. Seen by cardiology.  She was treated with IV diuresis and today converted to oral torsemide.  Now on carvedilol.  Spironolactone. Volume status improving.  Unable to tolerate Entresto. Patient remains episodically symptomatic which may be her baseline.  Chronic medical issues including COPD and chronic hypoxemic respiratory failure, fairly stable today.  Bronchodilators as needed.  Also on Anoro. CKD stage IIIb: At about baseline. History of coronary artery disease: Currently chest pain-free. Generalized anxiety disorder, on BuSpar and Klonopin. Hyperlipidemia, on Lipitor. Type 2 diabetes, on Accu-Cheks.  Still with significant pain and difficulty mobility.  If remains stable and pain controlled, anticipate discharge to SNF  tomorrow.   DVT prophylaxis: enoxaparin (LOVENOX) injection 30 mg Start: 05/04/23 0800 SCDs Start: 05/03/23 1804   Code Status: DNR Family Communication: Son at the bedside. Disposition Plan: Status is: Inpatient Remains inpatient appropriate because: Recovering from surgery.  Will need SNF rehab.  Treating postop pain.     Consultants:  Orthopedics Cardiology  Procedures:  Right hip hemiarthroplasty  Antimicrobials:  None   Subjective:  Patient seen and examined.  Anxious in the morning.  Stable at the time of my exam.  Patient complained of having hip pain and some other body area pain.  She was short of breath and wheezy intermittently.  Very worried.  Saturating adequately on 2 L oxygen. Discussed about taking more frequent pain medication in order not to suffer from pain.  Objective: Vitals:   05/08/23 0726 05/08/23 0730 05/08/23 0755 05/08/23 1222  BP:   (!) 102/54 (!) 96/54  Pulse:  95 93 80  Resp:  (!) 22 18 18   Temp:   98.9 F (37.2 C) 97.7 F (36.5 C)  TempSrc:      SpO2:  97% 100% 100%  Weight: 80.6 kg       Intake/Output Summary (Last 24 hours) at 05/08/2023 1417 Last data filed at 05/08/2023 1200 Gross per 24 hour  Intake 400 ml  Output 250 ml  Net 150 ml   Filed Weights   05/06/23 0459 05/07/23 0500 05/08/23 0726  Weight: 85.3 kg 80.6 kg 80.6 kg    Examination:  General exam: Appears mild anxious.  Occasionally hyperventilating with pain. Respiratory system: Good bilateral air entry.  Occasional conducted upper airway sounds. Cardiovascular system: S1 & S2 heard, RRR.  Trace bilateral pedal edema. Gastrointestinal system: Abdomen is nondistended, soft and nontender. No organomegaly  or masses felt. Normal bowel sounds heard. Central nervous system: Alert and oriented. No focal neurological deficits. Extremities: Symmetric 5 x 5 power. Skin: Right lateral hip incision clean and dry.    Data Reviewed: I have personally reviewed following labs  and imaging studies  CBC: Recent Labs  Lab 05/02/23 0309 05/03/23 0318 05/04/23 0636 05/05/23 0659 05/06/23 0951  WBC 9.4 12.2* 13.3* 11.4* 8.9  HGB 10.5* 10.9* 10.1* 9.9* 9.5*  HCT 35.2* 35.0* 32.9* 32.2* 31.2*  MCV 91.4 90.4 91.1 89.0 90.2  PLT 167 193 219 243 218   Basic Metabolic Panel: Recent Labs  Lab 05/02/23 0309 05/03/23 0318 05/04/23 0636 05/05/23 0659 05/06/23 0951 05/07/23 0642 05/08/23 0434  NA 136 135 135 131* 132* 133*  --   K 4.2 4.6 5.0 4.2 3.9 4.3  --   CL 99 96* 94* 94* 93* 94*  --   CO2 29 27 27 26 25 27   --   GLUCOSE 148* 154* 153* 148* 199* 149*  --   BUN 53* 51* 56* 65* 66* 70*  --   CREATININE 1.75* 1.54* 1.54* 1.57* 1.45* 1.27*  --   CALCIUM 8.6* 9.0 8.7* 8.4* 8.7* 8.7*  --   MG 1.7 1.7 1.8 1.8 1.7 2.1 2.0  PHOS 3.8  --   --   --   --   --   --    GFR: Estimated Creatinine Clearance: 37.8 mL/min (A) (by C-G formula based on SCr of 1.27 mg/dL (H)). Liver Function Tests: Recent Labs  Lab 05/02/23 0309 05/03/23 0318  AST 21 16  ALT 17 15  ALKPHOS 58 56  BILITOT 0.9 1.0  PROT 5.8* 6.1*  ALBUMIN 2.7* 2.7*   No results for input(s): "LIPASE", "AMYLASE" in the last 168 hours. No results for input(s): "AMMONIA" in the last 168 hours. Coagulation Profile: No results for input(s): "INR", "PROTIME" in the last 168 hours.  Cardiac Enzymes: No results for input(s): "CKTOTAL", "CKMB", "CKMBINDEX", "TROPONINI" in the last 168 hours. BNP (last 3 results) No results for input(s): "PROBNP" in the last 8760 hours. HbA1C: No results for input(s): "HGBA1C" in the last 72 hours. CBG: Recent Labs  Lab 05/07/23 0817 05/07/23 1222 05/07/23 1653 05/08/23 0806 05/08/23 1124  GLUCAP 212* 222* 154* 195* 164*   Lipid Profile: No results for input(s): "CHOL", "HDL", "LDLCALC", "TRIG", "CHOLHDL", "LDLDIRECT" in the last 72 hours. Thyroid Function Tests: No results for input(s): "TSH", "T4TOTAL", "FREET4", "T3FREE", "THYROIDAB" in the last 72  hours. Anemia Panel: No results for input(s): "VITAMINB12", "FOLATE", "FERRITIN", "TIBC", "IRON", "RETICCTPCT" in the last 72 hours. Sepsis Labs: No results for input(s): "PROCALCITON", "LATICACIDVEN" in the last 168 hours.  Recent Results (from the past 240 hour(s))  Surgical pcr screen     Status: Abnormal   Collection Time: 05/01/23  3:53 AM   Specimen: Nasal Mucosa; Nasal Swab  Result Value Ref Range Status   MRSA, PCR NEGATIVE NEGATIVE Final   Staphylococcus aureus POSITIVE (A) NEGATIVE Final    Comment: (NOTE) The Xpert SA Assay (FDA approved for NASAL specimens in patients 110 years of age and older), is one component of a comprehensive surveillance program. It is not intended to diagnose infection nor to guide or monitor treatment. Performed at [facility], [address], [place], [place] [address]          Radiology Studies: No results found.      Scheduled Meds:  aspirin EC  81 mg Oral Daily   atorvastatin  80 mg Oral Daily   busPIRone  10 mg Oral TID   carvedilol  6.25 mg Oral BID WC   Chlorhexidine Gluconate Cloth  6 each Topical Daily   docusate sodium  100 mg Oral BID   enoxaparin (LOVENOX) injection  30 mg Subcutaneous Q24H   ferrous sulfate  325 mg Oral TID PC   insulin aspart  0-9 Units Subcutaneous TID WC   melatonin  5 mg Oral QHS   pantoprazole  40 mg Oral Daily   polyethylene glycol  34 g Oral Daily   senna  1 tablet Oral BID   spironolactone  25 mg Oral Daily   torsemide  60 mg Oral BID   umeclidinium-vilanterol  1 puff Inhalation Daily   Continuous Infusions:  sodium chloride Stopped (05/03/23 2144)     LOS: 7 days    Time spent: 35 minutes    Dorcas Carrow, MD [facility]

## 2023-05-09 ENCOUNTER — Ambulatory Visit: Payer: Medicare Other | Admitting: Internal Medicine

## 2023-05-09 DIAGNOSIS — Z7401 Bed confinement status: Secondary | ICD-10-CM | POA: Diagnosis not present

## 2023-05-09 DIAGNOSIS — E781 Pure hyperglyceridemia: Secondary | ICD-10-CM | POA: Diagnosis present

## 2023-05-09 DIAGNOSIS — Z66 Do not resuscitate: Secondary | ICD-10-CM | POA: Diagnosis present

## 2023-05-09 DIAGNOSIS — N281 Cyst of kidney, acquired: Secondary | ICD-10-CM | POA: Diagnosis not present

## 2023-05-09 DIAGNOSIS — I2699 Other pulmonary embolism without acute cor pulmonale: Secondary | ICD-10-CM | POA: Diagnosis not present

## 2023-05-09 DIAGNOSIS — J90 Pleural effusion, not elsewhere classified: Secondary | ICD-10-CM | POA: Diagnosis not present

## 2023-05-09 DIAGNOSIS — R0600 Dyspnea, unspecified: Secondary | ICD-10-CM | POA: Diagnosis not present

## 2023-05-09 DIAGNOSIS — E1169 Type 2 diabetes mellitus with other specified complication: Secondary | ICD-10-CM | POA: Diagnosis present

## 2023-05-09 DIAGNOSIS — B952 Enterococcus as the cause of diseases classified elsewhere: Secondary | ICD-10-CM | POA: Diagnosis present

## 2023-05-09 DIAGNOSIS — S72001A Fracture of unspecified part of neck of right femur, initial encounter for closed fracture: Secondary | ICD-10-CM | POA: Diagnosis not present

## 2023-05-09 DIAGNOSIS — N390 Urinary tract infection, site not specified: Secondary | ICD-10-CM | POA: Diagnosis not present

## 2023-05-09 DIAGNOSIS — R5381 Other malaise: Secondary | ICD-10-CM | POA: Diagnosis not present

## 2023-05-09 DIAGNOSIS — R296 Repeated falls: Secondary | ICD-10-CM | POA: Diagnosis not present

## 2023-05-09 DIAGNOSIS — I5021 Acute systolic (congestive) heart failure: Secondary | ICD-10-CM | POA: Diagnosis not present

## 2023-05-09 DIAGNOSIS — N189 Chronic kidney disease, unspecified: Secondary | ICD-10-CM | POA: Diagnosis not present

## 2023-05-09 DIAGNOSIS — R531 Weakness: Secondary | ICD-10-CM | POA: Diagnosis not present

## 2023-05-09 DIAGNOSIS — Z96641 Presence of right artificial hip joint: Secondary | ICD-10-CM | POA: Diagnosis not present

## 2023-05-09 DIAGNOSIS — R140 Abdominal distension (gaseous): Secondary | ICD-10-CM | POA: Diagnosis not present

## 2023-05-09 DIAGNOSIS — R918 Other nonspecific abnormal finding of lung field: Secondary | ICD-10-CM | POA: Diagnosis not present

## 2023-05-09 DIAGNOSIS — N1832 Chronic kidney disease, stage 3b: Secondary | ICD-10-CM | POA: Diagnosis present

## 2023-05-09 DIAGNOSIS — J9611 Chronic respiratory failure with hypoxia: Secondary | ICD-10-CM | POA: Diagnosis present

## 2023-05-09 DIAGNOSIS — Y838 Other surgical procedures as the cause of abnormal reaction of the patient, or of later complication, without mention of misadventure at the time of the procedure: Secondary | ICD-10-CM | POA: Diagnosis present

## 2023-05-09 DIAGNOSIS — J449 Chronic obstructive pulmonary disease, unspecified: Secondary | ICD-10-CM | POA: Diagnosis not present

## 2023-05-09 DIAGNOSIS — N179 Acute kidney failure, unspecified: Secondary | ICD-10-CM | POA: Diagnosis present

## 2023-05-09 DIAGNOSIS — Z4789 Encounter for other orthopedic aftercare: Secondary | ICD-10-CM | POA: Diagnosis not present

## 2023-05-09 DIAGNOSIS — Z8679 Personal history of other diseases of the circulatory system: Secondary | ICD-10-CM | POA: Diagnosis not present

## 2023-05-09 DIAGNOSIS — E871 Hypo-osmolality and hyponatremia: Secondary | ICD-10-CM | POA: Diagnosis not present

## 2023-05-09 DIAGNOSIS — R41841 Cognitive communication deficit: Secondary | ICD-10-CM | POA: Diagnosis not present

## 2023-05-09 DIAGNOSIS — Z86711 Personal history of pulmonary embolism: Secondary | ICD-10-CM | POA: Diagnosis not present

## 2023-05-09 DIAGNOSIS — I452 Bifascicular block: Secondary | ICD-10-CM | POA: Diagnosis present

## 2023-05-09 DIAGNOSIS — R0602 Shortness of breath: Secondary | ICD-10-CM | POA: Diagnosis not present

## 2023-05-09 DIAGNOSIS — I700 Atherosclerosis of aorta: Secondary | ICD-10-CM | POA: Diagnosis not present

## 2023-05-09 DIAGNOSIS — M6259 Muscle wasting and atrophy, not elsewhere classified, multiple sites: Secondary | ICD-10-CM | POA: Diagnosis not present

## 2023-05-09 DIAGNOSIS — M25551 Pain in right hip: Secondary | ICD-10-CM | POA: Diagnosis not present

## 2023-05-09 DIAGNOSIS — R0789 Other chest pain: Secondary | ICD-10-CM | POA: Diagnosis not present

## 2023-05-09 DIAGNOSIS — I440 Atrioventricular block, first degree: Secondary | ICD-10-CM | POA: Diagnosis not present

## 2023-05-09 DIAGNOSIS — Z1152 Encounter for screening for COVID-19: Secondary | ICD-10-CM | POA: Diagnosis not present

## 2023-05-09 DIAGNOSIS — I2609 Other pulmonary embolism with acute cor pulmonale: Secondary | ICD-10-CM | POA: Diagnosis not present

## 2023-05-09 DIAGNOSIS — I5033 Acute on chronic diastolic (congestive) heart failure: Secondary | ICD-10-CM | POA: Diagnosis not present

## 2023-05-09 DIAGNOSIS — J9811 Atelectasis: Secondary | ICD-10-CM | POA: Diagnosis present

## 2023-05-09 DIAGNOSIS — N183 Chronic kidney disease, stage 3 unspecified: Secondary | ICD-10-CM | POA: Diagnosis not present

## 2023-05-09 DIAGNOSIS — S72001D Fracture of unspecified part of neck of right femur, subsequent encounter for closed fracture with routine healing: Secondary | ICD-10-CM | POA: Diagnosis not present

## 2023-05-09 DIAGNOSIS — I5023 Acute on chronic systolic (congestive) heart failure: Secondary | ICD-10-CM | POA: Diagnosis present

## 2023-05-09 DIAGNOSIS — I340 Nonrheumatic mitral (valve) insufficiency: Secondary | ICD-10-CM | POA: Diagnosis present

## 2023-05-09 DIAGNOSIS — E785 Hyperlipidemia, unspecified: Secondary | ICD-10-CM | POA: Diagnosis not present

## 2023-05-09 DIAGNOSIS — I499 Cardiac arrhythmia, unspecified: Secondary | ICD-10-CM | POA: Diagnosis not present

## 2023-05-09 DIAGNOSIS — R262 Difficulty in walking, not elsewhere classified: Secondary | ICD-10-CM | POA: Diagnosis not present

## 2023-05-09 DIAGNOSIS — Z1611 Resistance to penicillins: Secondary | ICD-10-CM | POA: Diagnosis present

## 2023-05-09 DIAGNOSIS — Z515 Encounter for palliative care: Secondary | ICD-10-CM | POA: Diagnosis not present

## 2023-05-09 DIAGNOSIS — R1312 Dysphagia, oropharyngeal phase: Secondary | ICD-10-CM | POA: Diagnosis not present

## 2023-05-09 DIAGNOSIS — I5022 Chronic systolic (congestive) heart failure: Secondary | ICD-10-CM | POA: Diagnosis not present

## 2023-05-09 DIAGNOSIS — R059 Cough, unspecified: Secondary | ICD-10-CM | POA: Diagnosis not present

## 2023-05-09 DIAGNOSIS — I4430 Unspecified atrioventricular block: Secondary | ICD-10-CM | POA: Diagnosis not present

## 2023-05-09 DIAGNOSIS — R109 Unspecified abdominal pain: Secondary | ICD-10-CM | POA: Diagnosis not present

## 2023-05-09 DIAGNOSIS — F419 Anxiety disorder, unspecified: Secondary | ICD-10-CM | POA: Diagnosis not present

## 2023-05-09 DIAGNOSIS — I959 Hypotension, unspecified: Secondary | ICD-10-CM | POA: Diagnosis not present

## 2023-05-09 DIAGNOSIS — I130 Hypertensive heart and chronic kidney disease with heart failure and stage 1 through stage 4 chronic kidney disease, or unspecified chronic kidney disease: Secondary | ICD-10-CM | POA: Diagnosis present

## 2023-05-09 DIAGNOSIS — I420 Dilated cardiomyopathy: Secondary | ICD-10-CM | POA: Diagnosis present

## 2023-05-09 DIAGNOSIS — J439 Emphysema, unspecified: Secondary | ICD-10-CM | POA: Diagnosis present

## 2023-05-09 DIAGNOSIS — I2510 Atherosclerotic heart disease of native coronary artery without angina pectoris: Secondary | ICD-10-CM | POA: Diagnosis not present

## 2023-05-09 DIAGNOSIS — T81718A Complication of other artery following a procedure, not elsewhere classified, initial encounter: Secondary | ICD-10-CM | POA: Diagnosis present

## 2023-05-09 DIAGNOSIS — R0902 Hypoxemia: Secondary | ICD-10-CM | POA: Diagnosis not present

## 2023-05-09 DIAGNOSIS — F17210 Nicotine dependence, cigarettes, uncomplicated: Secondary | ICD-10-CM | POA: Diagnosis not present

## 2023-05-09 DIAGNOSIS — I4510 Unspecified right bundle-branch block: Secondary | ICD-10-CM | POA: Diagnosis not present

## 2023-05-09 DIAGNOSIS — E1122 Type 2 diabetes mellitus with diabetic chronic kidney disease: Secondary | ICD-10-CM | POA: Diagnosis present

## 2023-05-09 DIAGNOSIS — J441 Chronic obstructive pulmonary disease with (acute) exacerbation: Secondary | ICD-10-CM | POA: Diagnosis not present

## 2023-05-09 DIAGNOSIS — F411 Generalized anxiety disorder: Secondary | ICD-10-CM | POA: Diagnosis present

## 2023-05-09 DIAGNOSIS — Z1621 Resistance to vancomycin: Secondary | ICD-10-CM | POA: Diagnosis present

## 2023-05-09 DIAGNOSIS — I255 Ischemic cardiomyopathy: Secondary | ICD-10-CM | POA: Diagnosis present

## 2023-05-09 LAB — GLUCOSE, CAPILLARY
Glucose-Capillary: 194 mg/dL — ABNORMAL HIGH (ref 70–99)
Glucose-Capillary: 181 mg/dL — ABNORMAL HIGH (ref 70–99)

## 2023-05-09 MED ORDER — ENOXAPARIN SODIUM 40 MG/0.4ML IJ SOSY: 30.0000 mg | PREFILLED_SYRINGE | INTRAMUSCULAR | 0 refills | Status: DC

## 2023-05-09 MED ORDER — DOCUSATE SODIUM 100 MG PO CAPS: 100.0000 mg | ORAL_CAPSULE | Freq: Two times a day (BID) | ORAL | Status: AC

## 2023-05-09 MED ORDER — CLONAZEPAM 0.5 MG PO TABS: 0.5000 mg | ORAL_TABLET | Freq: Two times a day (BID) | ORAL | 0 refills | Status: DC | PRN

## 2023-05-09 MED ORDER — POLYETHYLENE GLYCOL 3350 17 G PO PACK: 34.0000 g | PACK | Freq: Every day | ORAL | 0 refills | Status: AC

## 2023-05-09 MED ORDER — HYDROCODONE-ACETAMINOPHEN 5-325 MG PO TABS: 1.0000 | ORAL_TABLET | ORAL | 0 refills | Status: DC | PRN

## 2023-05-09 MED ORDER — VITAMIN D3 250 MCG (10000 UT) PO TABS: 10000.0000 [IU] | ORAL_TABLET | Freq: Every day | ORAL | Status: AC

## 2023-05-09 MED ORDER — FERROUS SULFATE 325 (65 FE) MG PO TABS: 325.0000 mg | ORAL_TABLET | Freq: Three times a day (TID) | ORAL | Status: AC

## 2023-05-09 MED ORDER — CARVEDILOL 6.25 MG PO TABS: 6.2500 mg | ORAL_TABLET | Freq: Two times a day (BID) | ORAL | Status: AC

## 2023-05-09 NOTE — TOC Transition Note (Signed)
Transition of Care [facility]) - CM/SW Discharge Note   Patient Details  Name: Angela Lucero MRN: [id number] Date of Birth: 1941/02/27  Transition of [facility] CM/SW Contact:  Lorri Frederick, LCSW Phone Number: 05/09/2023, 11:09 AM   Clinical Narrative:   Pt discharging to [facility]  RN call report to [phone].   1000: CSW confirmed with [facility] that they can receive pt today.  He is requesting pt bring her Jardiance medication.  Pt believes meds were brought to [facility]--RN notified and will check with pharmacy.  CSW spoke with son Trey Paula, who will also check pt home and can bring Jardiance to [facility] if needed.   Final next level of care: Skilled Nursing Facility Barriers to Discharge: Barriers Resolved   Patient Goals and CMS Choice   Choice offered to / list presented to : Patient (would like to return to [facility] in [place])  Discharge Placement                Patient chooses bed at:  [facility]) Patient to be transferred to facility by: [facility] Name of family member notified: son Trey Paula Patient and family notified of of transfer: 05/09/23  Discharge Plan and Services Additional resources added to the After Visit Summary for   In-house Referral: Clinical Social Work   Post Acute Care Choice: Skilled Nursing Facility                               Social Determinants of Health (SDOH) Interventions SDOH Screenings   Food Insecurity: No Food Insecurity (02/04/2023)  Housing: Low Risk  (02/04/2023)  Transportation Needs: No Transportation Needs (02/10/2023)  Utilities: Not At Risk (02/04/2023)  Alcohol Screen: Low Risk  (02/10/2023)  Financial Resource Strain: Low Risk  (02/10/2023)  Tobacco Use: Medium Risk (05/03/2023)     Readmission Risk Interventions     No data to display

## 2023-05-09 NOTE — Discharge Summary (Signed)
Physician Discharge Summary  Angela Lucero NWG:[id number] DOB: Oct 05, 1940 DOA: 05/01/2023  PCP: [name], MD  Admit date: 05/01/2023 Discharge date: 05/09/2023  Admitted From: Home Disposition: Skilled nursing facility  Recommendations for Outpatient Follow-up:  Follow up with PCP in 1-2 weeks Please obtain BMP/CBC in one week Consult palliative care for ongoing follow-up Cardiology to schedule follow-up  Home Health: Not applicable Equipment/Devices: Not applicable  Discharge Condition: Fair CODE STATUS: DNR with limited intervention Diet recommendation: Low-salt diet  Discharge summary: 82 year old with history of chronic systolic congestive heart failure with known ejection fraction 20 to 25%, coronary artery disease, COPD on 2 to 3 L oxygen at home, CKD stage IIIa, stroke, hypertension, hyperlipidemia and diabetes who presented to [facility] ER after a fall and right hip fracture.  She was also found to be in CHF exacerbation so she was transferred to [facility] for cardiology and orthopedics.  Treated with IV diuresis and then eventually underwent ORIF right hip. Patient remained in the hospital because of cardiovascular issues, shortness of breath IV diuresis.  She underwent ORIF right hip.  Some clinical improvement now.  Overall long-term prognosis remains poor.  Treated for following conditions.  Assessment & Plan:   Closed traumatic right femoral neck fracture: Right hip hemiarthroplasty 9/3, Dr. Dion Saucier.  Postoperatively stable as per surgery. Pain managed with oral pain medications.  Opiates as well as stool softener and laxatives. Weightbearing as tolerated Dry dressing DVT prophylaxis Lovenox for 4 weeks.  Acute on chronic hypoxemic respiratory failure, acute on chronic congestive heart failure: Patient does use oxygen at home.  Known ejection fraction of 20 to 25%. Significant shortness of breath and volume overload on admission. Seen by cardiology.  She was treated with IV  diuresis and converted to oral torsemide.  Patient on carvedilol, spironolactone and torsemide.  She is not able to tolerate other GDMT.  Entresto discontinued.  Nitro discontinued. She has better symptom control today.  She has poor prognosis.  Will request palliative care consultation and follow-up at SNF. Uses 2 L oxygen at home.  She may need 2 to 3 L of oxygen to keep saturations more than 90%.  Also use bronchodilator therapy as below.   COPD and chronic hypoxemic respiratory failure, fairly stable today.  Bronchodilators as needed.  Also on Anoro.  CKD stage IIIb: At about baseline.  History of coronary artery disease: Currently chest pain-free.  Generalized anxiety disorder, on BuSpar and Klonopin.  Hyperlipidemia, on Lipitor.  Type 2 diabetes, well-controlled.  On Farxiga.   Remains stable today.  Has plenty of chronic medical issues and high risk of readmission.  Stable to discharge.   Discharge Diagnoses:  Principal Problem:   Closed right hip fracture, initial encounter [facility]) Active Problems:   Displaced fracture of right femoral neck (HCC)   Acute exacerbation of CHF (congestive heart failure) (HCC)   COPD (chronic obstructive pulmonary disease) (HCC)   GAD (generalized anxiety disorder)   Hyperlipidemia   History of CAD (coronary artery disease)   Chronic hypoxic respiratory failure (HCC)   Chronic kidney disease (CKD), stage III (moderate) (HCC)    Discharge Instructions  Discharge Instructions     Diet - low sodium heart healthy   Complete by: As directed    Discharge instructions   Complete by: As directed    Consult palliative care at the SNF for ongoing support   Increase activity slowly   Complete by: As directed       Allergies as of 05/09/2023  No Known Allergies      Medication List     STOP taking these medications    Entresto 24-26 MG Generic drug: sacubitril-valsartan   isosorbide mononitrate 30 MG 24 hr tablet Commonly known as:  IMDUR   metoprolol succinate 25 MG 24 hr tablet Commonly known as: TOPROL-XL   nicotine 7 mg/24hr patch Commonly known as: NICODERM CQ - dosed in mg/24 hr   tamsulosin 0.4 MG Caps capsule Commonly known as: FLOMAX   traMADol 50 MG tablet Commonly known as: ULTRAM       TAKE these medications    acetaminophen 500 MG tablet Commonly known as: TYLENOL Take 500 mg by mouth every 6 (six) hours as needed for mild pain.   albuterol 108 (90 Base) MCG/ACT inhaler Commonly known as: VENTOLIN HFA Inhale 2 puffs into the lungs every 6 (six) hours as needed for wheezing or shortness of breath.   ALLERGY RELIEF D PO Take 1 tablet by mouth daily.   aspirin EC 81 MG tablet Take 81 mg by mouth daily. Swallow whole.   atorvastatin 80 MG tablet Commonly known as: LIPITOR Take 1 tablet (80 mg total) by mouth daily.   busPIRone 10 MG tablet Commonly known as: BUSPAR Take 1 tablet (10 mg total) by mouth 3 (three) times daily.   carvedilol 6.25 MG tablet Commonly known as: COREG Take 1 tablet (6.25 mg total) by mouth 2 (two) times daily with a meal.   clonazePAM 0.5 MG tablet Commonly known as: KLONOPIN Take 1 tablet (0.5 mg total) by mouth 2 (two) times daily as needed for up to 5 days for anxiety.   docusate sodium 100 MG capsule Commonly known as: COLACE Take 1 capsule (100 mg total) by mouth 2 (two) times daily.   empagliflozin 25 MG Tabs tablet Commonly known as: JARDIANCE Take 1 tablet (25 mg total) by mouth daily.   enoxaparin 40 MG/0.4ML injection Commonly known as: LOVENOX Inject 0.3 mLs (30 mg total) into the skin daily for 25 days. For DVT prophylaxis after surgery   ferrous sulfate 325 (65 FE) MG tablet Take 1 tablet (325 mg total) by mouth 3 (three) times daily after meals.   HYDROcodone-acetaminophen 5-325 MG tablet Commonly known as: Norco Take 1-2 tablets by mouth every 4 (four) hours as needed for severe pain or moderate pain. 1 tablets for moderate pain, 2  tablet for severe pain   magnesium oxide 400 (240 Mg) MG tablet Commonly known as: MAG-OX Take 1 tablet (400 mg total) by mouth daily.   melatonin 5 MG Tabs Take 1 tablet (5 mg total) by mouth at bedtime.   MULTIVITAMIN ADULT PO Take 1 tablet by mouth daily.   nitroGLYCERIN 0.4 MG SL tablet Commonly known as: NITROSTAT Place 1 tablet (0.4 mg total) under the tongue every 5 (five) minutes as needed for chest pain.   Omega 3 1000 MG Caps Take 2 capsules by mouth in the morning and at bedtime.   pantoprazole 40 MG tablet Commonly known as: PROTONIX Take 1 tablet (40 mg total) by mouth daily.   polyethylene glycol 17 g packet Commonly known as: MIRALAX / GLYCOLAX Take 34 g by mouth daily.   spironolactone 50 MG tablet Commonly known as: ALDACTONE Take 1 tablet (50 mg total) by mouth daily.   torsemide 20 MG tablet Commonly known as: DEMADEX Take 3 tablets (60 mg total) by mouth 2 (two) times daily.   umeclidinium-vilanterol 62.5-25 MCG/ACT Aepb Commonly known as: ANORO ELLIPTA Inhale 1  puff into the lungs daily.   Vitamin D3 250 MCG (10000 UT) Tabs Take 10,000 Units by mouth daily at 6 (six) AM. What changed: when to take this        Contact information for follow-up providers     Teryl Lucy, MD. Schedule an appointment as soon as possible for a visit in 2 week(s).   Specialty: Orthopedic Surgery Contact information: [address] [place] [address] [phone]              Contact information for after-discharge care     Destination     [facility] .   Service: Skilled Nursing Contact information: [address] [place] [address] [phone]                    No Known Allergies  Consultations: Cardiology Orthopedics   Procedures/Studies: DG HIP UNILAT W OR W/O PELVIS 2-3 VIEWS RIGHT  Result Date: 05/03/2023 CLINICAL DATA:  Status  post right hip hemiarthroplasty. EXAM: DG HIP (WITH OR WITHOUT PELVIS) 2-3V RIGHT COMPARISON:  Right hip radiographs 05/01/2023 FINDINGS: Interval right hip hemiarthroplasty. No perihardware lucency is seen to indicate hardware failure or loosening. Expected postoperative subcutaneous air within the lateral right hip and thigh. Mild-to-moderate bilateral inferior sacroiliac subchondral sclerosis. Mild left femoroacetabular joint space narrowing. No acute fracture or dislocation.Moderate atherosclerotic calcifications. IMPRESSION: Interval right hip hemiarthroplasty without evidence of hardware failure or loosening. Electronically Signed   By: Neita Garnet M.D.   On: 05/03/2023 18:06   DG Chest Port 1V same Day  Result Date: 05/03/2023 CLINICAL DATA:  Shortness of breath, congestive heart failure. EXAM: PORTABLE CHEST 1 VIEW COMPARISON:  May 01, 2023. FINDINGS: Stable cardiomegaly. Mild central pulmonary vascular congestion is noted. Mild interstitial densities are noted concerning for pulmonary edema. Bony thorax is unremarkable. IMPRESSION: Cardiomegaly with mild central pulmonary vascular congestion and probable bilateral pulmonary edema. Aortic Atherosclerosis (ICD10-I70.0). Electronically Signed   By: Lupita Raider M.D.   On: 05/03/2023 09:29   ECHOCARDIOGRAM LIMITED  Result Date: 05/01/2023    ECHOCARDIOGRAM LIMITED REPORT   Patient Name:   Angela Lucero  Date of Exam: 05/01/2023 Medical Rec #:  [id number]  Height:       66.0 in Accession #:    [id number] Weight:       180.6 lb Date of Birth:  [birth year]  BSA:          1.915 m Patient Age:    81 years   BP:           92/58 mmHg Patient Gender: F          HR:           101 bpm. Exam Location:  Inpatient Procedure: Limited Echo and Intracardiac Opacification Agent Indications:    CHF-Acute Systolic I50.21  History:        Patient has prior history of Echocardiogram examinations, most                 recent 04/01/2023. CHF, COPD and Stroke; Risk                  Factors:Hypertension, Diabetes and Dyslipidemia.  Sonographer:    Celesta Gentile RCS Referring Phys: [id number] SUBRINA SUNDIL IMPRESSIONS  1. Anterior wall is very thin c/w scar. Left ventricular ejection fraction, by estimation, is 20 to 25%. The left ventricle has severely decreased function. The left ventricular internal  cavity size was severely dilated.  2. Right ventricular systolic function is normal. The right ventricular size is mildly enlarged.  3. The mitral valve is normal in structure.  4. The aortic valve is normal in structure.  5. The inferior vena cava is dilated in size with >50% respiratory variability, suggesting right atrial pressure of 8 mmHg. Conclusion(s)/Recommendation(s): Limited study. No detailed valve assessment. Otherwise no significant changes from prior study. FINDINGS  Left Ventricle: Anterior wall is very thin c/w scar. Left ventricular ejection fraction, by estimation, is 20 to 25%. The left ventricle has severely decreased function. Definity contrast agent was given IV to delineate the left ventricular endocardial borders. The left ventricular internal cavity size was severely dilated. Right Ventricle: The right ventricular size is mildly enlarged. Right ventricular systolic function is normal. Pericardium: There is no evidence of pericardial effusion. Mitral Valve: The mitral valve is normal in structure. Aortic Valve: The aortic valve is normal in structure. Venous: The inferior vena cava is dilated in size with greater than 50% respiratory variability, suggesting right atrial pressure of 8 mmHg. LEFT VENTRICLE PLAX 2D LVIDd:         6.80 cm LVIDs:         6.00 cm LV PW:         1.20 cm LV IVS:        0.70 cm LVOT diam:     1.90 cm LVOT Area:     2.84 cm  LV Volumes (MOD) LV vol d, MOD A2C: 157.0 ml LV vol d, MOD A4C: 190.0 ml LV vol s, MOD A2C: 138.0 ml LV vol s, MOD A4C: 132.0 ml LV SV MOD A2C:     19.0 ml LV SV MOD A4C:     190.0 ml LV SV MOD BP:      40.9 ml RIGHT VENTRICLE  TAPSE (M-mode): 2.4 cm LEFT ATRIUM         Index LA diam:    4.10 cm 2.14 cm/m   AORTA Ao Root diam: 3.60 cm  SHUNTS Systemic Diam: 1.90 cm Carolan Clines Electronically signed by Carolan Clines Signature Date/Time: 05/01/2023/4:33:23 PM    Final    DG Knee 2 Views Right  Result Date: 05/01/2023 CLINICAL DATA:  Right hip fracture. EXAM: RIGHT KNEE - 3 VIEW COMPARISON:  None Available. FINDINGS: There is diffuse decreased bone mineralization. Moderate lateral and mild medial compartment joint space narrowing and peripheral osteophytosis. Mild patellofemoral joint space narrowing and superior osteophytosis. Small joint effusion. Minimal chronic enthesopathic change at the quadriceps insertion on the patella. High-grade atherosclerotic calcifications. No acute fracture or dislocation. IMPRESSION: 1. 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 2. Small joint effusion. Electronically Signed   By: Neita Garnet M.D.   On: 05/01/2023 11:19   DG HIP UNILAT WITH PELVIS 2-3 VIEWS RIGHT  Result Date: 05/01/2023 CLINICAL DATA:  Right hip fracture. EXAM: DG HIP (WITH OR WITHOUT PELVIS) 2-3V RIGHT COMPARISON:  Pelvis and right hip radiographs 04/30/2023 at 4:26 a.m. FINDINGS: There is diffuse decreased bone mineralization. Redemonstration of acute proximal femoral fracture with approximately 1.9 cm superior displacement of the distal fracture component with respect to the proximal fracture component. The right femoral head remains appropriately located with respect to the right acetabulum. Mild bilateral femoroacetabular joint space narrowing. Moderate to high-grade atherosclerotic calcifications. IMPRESSION: Redemonstration of acute proximal femoral fracture with approximately 1.9 cm superior displacement of the distal fracture component with respect to the proximal fracture component. Electronically Signed  By: Neita Garnet M.D.   On: 05/01/2023 10:47   DG Chest Port 1 View  Result Date:  05/01/2023 CLINICAL DATA:  Dyspnea.  Right hip fracture EXAM: PORTABLE CHEST 1 VIEW COMPARISON:  04/30/2023 [facility] FINDINGS: Numerous leads and wires project over the chest. Patient rotated left. Moderate cardiomegaly. Atherosclerosis in the transverse aorta. Possible small left pleural effusion versus artifact secondary to overlying breast tissue. Similar. No pneumothorax. Mild pulmonary venous congestion, likely superimposed upon chronic interstitial thickening. No lobar consolidation. IMPRESSION: Similar appearance of cardiomegaly and pulmonary venous congestion. Possible small left pleural effusion versus artifact from overlying breast tissue. Aortic Atherosclerosis (ICD10-I70.0). Electronically Signed   By: Jeronimo Greaves M.D.   On: 05/01/2023 10:38   (Echo, Carotid, EGD, Colonoscopy, ERCP)    Subjective: Patient seen and examined.  She has some hip pain and mostly managed by oral oxycodone.  Denies any shortness of breath other than her usual problems.  She is on 2 L oxygen.  Agreeable to go to skilled nursing facility.   Discharge Exam: Vitals:   05/09/23 0400 05/09/23 0754  BP: 108/69   Pulse:  92  Resp:    Temp: 98 F (36.7 C) 97.9 F (36.6 C)  SpO2:     Vitals:   05/08/23 1958 05/09/23 0400 05/09/23 0405 05/09/23 0754  BP: 104/61 108/69    Pulse:    92  Resp: 19     Temp: 98.5 F (36.9 C) 98 F (36.7 C)  97.9 F (36.6 C)  TempSrc: Oral Oral  Oral  SpO2: 98%     Weight:   81.4 kg     General: Pt is alert, awake, not in acute distress Mildly anxious.  Chronically sick looking.  Debilitated. Cardiovascular: RRR, S1/S2 +, no rubs, no gallops Respiratory: CTA bilaterally, no wheezing, no rhonchi, occasional conducted upper airway sounds.  On 2 L oxygen. Abdominal: Soft, NT, ND, bowel sounds + Extremities: Trace bilateral pedal edema mostly chronic.  Right lateral hip incisions clean and dry.    The results of significant diagnostics from this hospitalization  (including imaging, microbiology, ancillary and laboratory) are listed below for reference.     Microbiology: Recent Results (from the past 240 hour(s))  Surgical pcr screen     Status: Abnormal   Collection Time: 05/01/23  3:53 AM   Specimen: Nasal Mucosa; Nasal Swab  Result Value Ref Range Status   MRSA, PCR NEGATIVE NEGATIVE Final   Staphylococcus aureus POSITIVE (A) NEGATIVE Final    Comment: (NOTE) The Xpert SA Assay (FDA approved for NASAL specimens in patients 68 years of age and older), is one component of a comprehensive surveillance program. It is not intended to diagnose infection nor to guide or monitor treatment. Performed at [facility], [address], [place], [place] [address]      Labs: BNP (last 3 results) Recent Labs    02/04/23 1152 05/01/23 0636  BNP 3,951.4* 2,534.0*   Basic Metabolic Panel: Recent Labs  Lab 05/03/23 0318 05/04/23 0636 05/05/23 0659 05/06/23 0951 05/07/23 0642 05/08/23 0434  NA 135 135 131* 132* 133*  --   K 4.6 5.0 4.2 3.9 4.3  --   CL 96* 94* 94* 93* 94*  --   CO2 27 27 26 25 27   --   GLUCOSE 154* 153* 148* 199* 149*  --   BUN 51* 56* 65* 66* 70*  --   CREATININE 1.54* 1.54* 1.57* 1.45* 1.27*  --   CALCIUM  9.0 8.7* 8.4* 8.7* 8.7*  --   MG 1.7 1.8 1.8 1.7 2.1 2.0   Liver Function Tests: Recent Labs  Lab 05/03/23 0318  AST 16  ALT 15  ALKPHOS 56  BILITOT 1.0  PROT 6.1*  ALBUMIN 2.7*   No results for input(s): "LIPASE", "AMYLASE" in the last 168 hours. No results for input(s): "AMMONIA" in the last 168 hours. CBC: Recent Labs  Lab 05/03/23 0318 05/04/23 0636 05/05/23 0659 05/06/23 0951  WBC 12.2* 13.3* 11.4* 8.9  HGB 10.9* 10.1* 9.9* 9.5*  HCT 35.0* 32.9* 32.2* 31.2*  MCV 90.4 91.1 89.0 90.2  PLT 193 219 243 218   Cardiac Enzymes: No results for input(s): "CKTOTAL", "CKMB", "CKMBINDEX", "TROPONINI" in the last 168 hours. BNP: Invalid input(s): "POCBNP" CBG: Recent Labs  Lab 05/07/23 1653  05/08/23 0806 05/08/23 1124 05/08/23 1625 05/09/23 0744  GLUCAP 154* 195* 164* 124* 194*   D-Dimer No results for input(s): "DDIMER" in the last 72 hours. Hgb A1c No results for input(s): "HGBA1C" in the last 72 hours. Lipid Profile No results for input(s): "CHOL", "HDL", "LDLCALC", "TRIG", "CHOLHDL", "LDLDIRECT" in the last 72 hours. Thyroid function studies No results for input(s): "TSH", "T4TOTAL", "T3FREE", "THYROIDAB" in the last 72 hours.  Invalid input(s): "FREET3" Anemia work up No results for input(s): "VITAMINB12", "FOLATE", "FERRITIN", "TIBC", "IRON", "RETICCTPCT" in the last 72 hours. Urinalysis    Component Value Date/Time   COLORURINE YELLOW 02/16/2023 1805   APPEARANCEUR HAZY (A) 02/16/2023 1805   LABSPEC 1.012 02/16/2023 1805   PHURINE 5.0 02/16/2023 1805   GLUCOSEU >=500 (A) 02/16/2023 1805   HGBUR NEGATIVE 02/16/2023 1805   BILIRUBINUR NEGATIVE 02/16/2023 1805   KETONESUR NEGATIVE 02/16/2023 1805   PROTEINUR 30 (A) 02/16/2023 1805   NITRITE NEGATIVE 02/16/2023 1805   LEUKOCYTESUR SMALL (A) 02/16/2023 1805   Sepsis Labs Recent Labs  Lab 05/03/23 0318 05/04/23 0636 05/05/23 0659 05/06/23 0951  WBC 12.2* 13.3* 11.4* 8.9   Microbiology Recent Results (from the past 240 hour(s))  Surgical pcr screen     Status: Abnormal   Collection Time: 05/01/23  3:53 AM   Specimen: Nasal Mucosa; Nasal Swab  Result Value Ref Range Status   MRSA, PCR NEGATIVE NEGATIVE Final   Staphylococcus aureus POSITIVE (A) NEGATIVE Final    Comment: (NOTE) The Xpert SA Assay (FDA approved for NASAL specimens in patients 22 years of age and older), is one component of a comprehensive surveillance program. It is not intended to diagnose infection nor to guide or monitor treatment. Performed at [facility], [address] [place], [place] [address]      Time coordinating discharge:  32 minutes  SIGNED:   Dorcas Carrow, MD  [facility] 05/09/2023, 8:03  AM

## 2023-05-09 NOTE — Progress Notes (Signed)
Physical Therapy Treatment Patient Details Name: Angela Lucero MRN: [id number] DOB: 01-20-1941 Today's Date: 05/09/2023   History of Present Illness Pt is 82 yo presenting to [facility] ED following a ground level fall while using her walker and sustaining a R hip fracture. She was found to have HF exacerbation as well and transferred to [facility]. Recently admitted to [facility] from 8/16-8/19 for NSTEMI and CHF exacerbation. Currently pt is s/p R posterior hemi hip arthroplasty. PMH: Acute exacerbation of CHF, COPD, GAD, hyperlipidemia, CAD, chronic hypoxic respiratory failure, Chronic kidney disease III.    PT Comments  Pt received in supine, c/o pain and fatigue after NT recently assisted her to/from [facility] just prior to session, but agreeable to bed-level session for instruction on RLE hip precs and exercises for ROM/strengthening. Handouts given to reinforce HEP/Precs given decreased pt recall of precs at beginning of session and pt needing AA to perform some ROM on RLE due to pain/fatigue. VSS on 2L O2 , pt reports severe pain 8/10 and fatigue at end of session. Pt continues to benefit from PT services to progress toward functional mobility goals.     If plan is discharge home, recommend the following: Assistance with cooking/housework;Assist for transportation;Help with stairs or ramp for entrance;A little help with bathing/dressing/bathroom;A lot of help with walking and/or transfers   Can travel by private vehicle     No  Equipment Recommendations  Other (comment);None recommended by PT (TBD post-acute)    Recommendations for Other Services       Precautions / Restrictions Precautions Precautions: Posterior Hip Precaution Booklet Issued: Yes (comment) Precaution Comments: Pt not initially able to state, handout given to reinforce Restrictions Weight Bearing Restrictions: Yes RLE Weight Bearing: Weight bearing as tolerated     Mobility  Bed Mobility Overal bed mobility: Needs  Assistance             General bed mobility comments: pt defers EOB/OOB mobility as she just returned from [facility] to supine with NT assist. Per NT she was +1 assist with RW.    Transfers Overall transfer level: Needs assistance                 General transfer comment: pt defers    Ambulation/Gait               General Gait Details: pt defers   Stairs             Wheelchair Mobility     Tilt Bed    Modified Rankin (Stroke Patients Only)       Balance Overall balance assessment: Needs assistance     Sitting balance - Comments: pt defers                                    Cognition Arousal: Alert Behavior During Therapy: WFL for tasks assessed/performed, Flat affect Overall Cognitive Status: Within Functional Limits for tasks assessed                                          Exercises Total Joint Exercises Ankle Circles/Pumps: AROM, Both, 10 reps, Supine Quad Sets: AROM, Both, 5 reps, Supine Short Arc Quad: AROM, Right, 5 reps, Supine Heel Slides: AROM, AAROM, Right, 5 reps, Supine Hip ABduction/ADduction: Right, 5 reps, AAROM, Supine (cues not to pass midline)  Long Arc Quad:  (pt defers) Marching in Standing:  (pt defers) Other Exercises Other Exercises: IS x 5 reps, pt achieves 300-700 mL, encouraged hourly use Other Exercises: Total hip surgery handout given to reinforce HEP    General Comments General comments (skin integrity, edema, etc.): SpO2 and HR WFL on 2L O2 [place] in supine with exercises. BP WFL prior to session and did not assess as she remained in supine.      Pertinent Vitals/Pain Pain Assessment Pain Assessment: 0-10 Pain Score: 8  Pain Location: R hip pain Pain Descriptors / Indicators: Guarding, Grimacing, Sharp Pain Intervention(s): Limited activity within patient's tolerance, Monitored during session, Premedicated before session, Repositioned, Other (comment) (pt defers ice packs,  stating "it doesn't help")    Home Living                          Prior Function            PT Goals (current goals can now be found in the care plan section) Acute Rehab PT Goals Patient Stated Goal: Go to rehab to get stronger and return home. PT Goal Formulation: With patient Time For Goal Achievement: 05/18/23 Progress towards PT goals: Progressing toward goals (mobilizing with staff earlier in the day x2)    Frequency    Min 1X/week      PT Plan      Co-evaluation              AM-PAC PT "6 Clicks" Mobility   Outcome Measure  Help needed turning from your back to your side while in a flat bed without using bedrails?: A Lot (based on NT report from her activity just prior to session today; pt defers during session) Help needed moving from lying on your back to sitting on the side of a flat bed without using bedrails?: A Lot Help needed moving to and from a bed to a chair (including a wheelchair)?: A Lot Help needed standing up from a chair using your arms (e.g., wheelchair or bedside chair)?: A Lot Help needed to walk in hospital room?: Total Help needed climbing 3-5 steps with a railing? : Total 6 Click Score: 10    End of Session Equipment Utilized During Treatment: Oxygen Activity Tolerance: Patient limited by pain Patient left: in bed;with call bell/phone within reach;with bed alarm set;Other (comment) (RLE elevated with heel floated per pt request) Nurse Communication: Mobility status;Patient requests pain meds PT Visit Diagnosis: Other abnormalities of gait and mobility (R26.89)     Time: 1610-9604 PT Time Calculation (min) (ACUTE ONLY): 16 min  Charges:    $Therapeutic Exercise: 8-22 mins PT General Charges $$ ACUTE PT VISIT: 1 Visit                     [place] Jon P., PTA Acute Rehabilitation Services Secure Chat Preferred 9a-5:30pm Office: [phone]    Dorathy Kinsman [facility] 05/09/2023, 11:34 AM

## 2023-05-09 NOTE — Care Management Important Message (Signed)
Important Message  Patient Details  Name: Symia Detemple MRN: [id number] Date of Birth: 06/11/1941   Medicare Important Message Given:  Yes     Sherilyn Banker 05/09/2023, 1:20 PM

## 2023-05-09 NOTE — Plan of Care (Signed)
  Problem: Clinical Measurements: Goal: Will remain free from infection Outcome: Progressing Goal: Diagnostic test results will improve Outcome: Progressing   Problem: Skin Integrity: Goal: Risk for impaired skin integrity will decrease Outcome: Progressing   

## 2023-05-10 DIAGNOSIS — R5381 Other malaise: Secondary | ICD-10-CM | POA: Diagnosis not present

## 2023-05-10 DIAGNOSIS — R262 Difficulty in walking, not elsewhere classified: Secondary | ICD-10-CM | POA: Diagnosis not present

## 2023-05-10 DIAGNOSIS — Z96641 Presence of right artificial hip joint: Secondary | ICD-10-CM | POA: Diagnosis not present

## 2023-05-10 DIAGNOSIS — I5022 Chronic systolic (congestive) heart failure: Secondary | ICD-10-CM | POA: Diagnosis not present

## 2023-05-11 DIAGNOSIS — I10 Essential (primary) hypertension: Secondary | ICD-10-CM | POA: Insufficient documentation

## 2023-05-11 DIAGNOSIS — I2510 Atherosclerotic heart disease of native coronary artery without angina pectoris: Secondary | ICD-10-CM | POA: Insufficient documentation

## 2023-05-11 DIAGNOSIS — R262 Difficulty in walking, not elsewhere classified: Secondary | ICD-10-CM | POA: Diagnosis not present

## 2023-05-11 DIAGNOSIS — Z96641 Presence of right artificial hip joint: Secondary | ICD-10-CM | POA: Diagnosis not present

## 2023-05-11 DIAGNOSIS — I639 Cerebral infarction, unspecified: Secondary | ICD-10-CM | POA: Insufficient documentation

## 2023-05-11 DIAGNOSIS — I219 Acute myocardial infarction, unspecified: Secondary | ICD-10-CM | POA: Insufficient documentation

## 2023-05-11 DIAGNOSIS — E119 Type 2 diabetes mellitus without complications: Secondary | ICD-10-CM | POA: Insufficient documentation

## 2023-05-11 DIAGNOSIS — E781 Pure hyperglyceridemia: Secondary | ICD-10-CM | POA: Insufficient documentation

## 2023-05-11 DIAGNOSIS — F419 Anxiety disorder, unspecified: Secondary | ICD-10-CM | POA: Diagnosis not present

## 2023-05-11 DIAGNOSIS — I5022 Chronic systolic (congestive) heart failure: Secondary | ICD-10-CM | POA: Diagnosis not present

## 2023-05-12 NOTE — Progress Notes (Deleted)
Cardiology Office Note:  .   Date:  05/12/2023  ID:  Angela Lucero, DOB 1941-04-06, MRN [id number] PCP: [name], MD  [facility] Providers Cardiologist:  Gypsy Balsam, MD    History of Present Illness: .   Angela Lucero is a 82 y.o. female with a past medical history of CAD S/P PTCA and PCI in 1999 & 2005, COPD oxygen dependent, CVA, DM 2, gout, hyperlipidemia, hypertension.  04/05/2023 stress evaluation revealed large fixed severe defect on the anterior apical wall with no definitive reversible changes 04/05/2023 echocardiogram EF of 20 to 25%, severe pulmonary hypertension with systolic pressure 63 mmHg, severe mitral regurgitation 02/25/23 echo EF 20-25%, global hypokinesis, moderately elevated PA pressures, severe MR, AV sclerosis 12/15/21 cMRI severe dilatation, severe diastolic dysfunction, subendocardial LGE consistent with prior infarct and basal atrial septum, mid anterior to/anteroseptum, apical anterior/septum and apex, EF 24% 12/14/2021 left heart cath severe single-vessel CAD with CTO of mid LAD with right to left and left to left collaterals, moderate left circumflex and RCA disease, patent distal RCA stent with mild in-stent restenosis  Hospital admission 02/04/2023 to 02/10/2023 for acute hypoxic respiratory failure, Upon presentation to the ED she was short of breath, acute sharp chest pain, BNP 3951, troponin 147-136, sodium 122, glucose 43. Eventually moved to inpatient rehab on 02/10/2023 to 02/25/2023 for debility, ultimately discharged home after PT OT services at rehab.    She was readmitted to [facility] on 04/01/23 for heart failure exacerbation, proBNP was 15,300.  Troponins trended 0.14> 3.42, consistent with non-STEMI, she was darted on heparin and placed on the Lasix drip.  Stress test was completed on 04/05/2023 showed large fixed severe defect of the anterior apical wall with no definitive reversible changes.  Echocardiogram revealed an EF of 20 to 25%, severe pulmonary  hypertension with systolic pressure 63 mmHg, severe mitral regurgitation.  She did have an episode of NSVT, felt to be exacerbated by severe hypomagnesemia.  LDL 35, labs on day of discharge revealed creatinine 1.40, potassium 3.9, sodium 135.  EP evaluation to consider CRT therapy after GDMT is maximized.  Admitted [facility] 04/15/2023, some confusion around how she was taking her medications and had not been taking her diuretics.  She required IV Lasix. Potassium 4.1, creatinine 1.20, proBNP 12,700, LDL 36.  Admitted to [facility] on 05/01/2023 s/p closed right hip fracture, she underwent ORIF and was ultimately discharged to SNF.    ROS: Review of Systems  Constitutional:  Positive for malaise/fatigue.  HENT: Negative.    Eyes: Negative.   Respiratory:  Positive for shortness of breath.   Cardiovascular:  Positive for leg swelling.  Gastrointestinal: Negative.   Genitourinary: Negative.   Musculoskeletal: Negative.   Skin: Negative.   Neurological: Negative.   Endo/Heme/Allergies:  Bruises/bleeds easily.  Psychiatric/Behavioral: Negative.       Studies Reviewed: .          Risk Assessment/Calculations:     No BP recorded.  {Refresh Note OR Click here to enter BP  :1}***       Physical Exam:   VS:  There were no vitals taken for this visit.   Wt Readings from Last 3 Encounters:  05/09/23 179 lb 7.3 oz (81.4 kg)  04/25/23 180 lb 8 oz (81.9 kg)  04/19/23 173 lb (78.5 kg)    GEN: Well nourished, well developed in no acute distress NECK: No JVD; No carotid bruits CARDIAC: RRR, no murmurs, rubs, gallops RESPIRATORY:  Clear to auscultation without rales,  wheezing or rhonchi  ABDOMEN: Soft, non-tender, non-distended EXTREMITIES: +2 pedal edema mid tibia; No deformity   ASSESSMENT AND PLAN: .   CAD-s/P DES x 2 it appears in 2021; stress evaluation on 04/05/2023 revealed large fixed severe defect on the anterior apical wall with no definitive reversible changes. Stable with  no anginal symptoms. No indication for ischemic evaluation.  Continue aspirin 81 mg daily, continue Imdur 90 mg daily, continue metoprolol 25 mg daily. 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  Continue Genia Del is on 25 mg daily for DM, continue Entresto 24-26 mg twice daily, continue metoprolol 25 mg daily.  Will start spironolactone 12.5 mg daily, GFR 43, potassium 3.8.  Will repeat BMET in 3 days, again in 10 days. Hyperlipidemia-recent LDL was well-controlled 35 on 04/05/2023, continue Lipitor 80 mg daily. Hypertension-blood pressure is actually low today, we will stop her hydralazine. COPD-currently oxygen dependent, followed by Dr. Winfred Leeds        Dispo: Labs per above, spironolactone per above  Signed, Flossie Dibble, NP

## 2023-05-13 ENCOUNTER — Ambulatory Visit: Payer: Medicare Other | Attending: Cardiology | Admitting: Cardiology

## 2023-05-13 DIAGNOSIS — R0609 Other forms of dyspnea: Secondary | ICD-10-CM

## 2023-05-13 DIAGNOSIS — I2510 Atherosclerotic heart disease of native coronary artery without angina pectoris: Secondary | ICD-10-CM

## 2023-05-13 DIAGNOSIS — I255 Ischemic cardiomyopathy: Secondary | ICD-10-CM

## 2023-05-13 DIAGNOSIS — E785 Hyperlipidemia, unspecified: Secondary | ICD-10-CM

## 2023-05-13 DIAGNOSIS — R609 Edema, unspecified: Secondary | ICD-10-CM

## 2023-05-13 DIAGNOSIS — E118 Type 2 diabetes mellitus with unspecified complications: Secondary | ICD-10-CM

## 2023-05-19 ENCOUNTER — Other Ambulatory Visit: Payer: Self-pay | Admitting: Internal Medicine

## 2023-05-19 DIAGNOSIS — R140 Abdominal distension (gaseous): Secondary | ICD-10-CM | POA: Diagnosis not present

## 2023-05-19 DIAGNOSIS — R059 Cough, unspecified: Secondary | ICD-10-CM | POA: Diagnosis not present

## 2023-05-19 DIAGNOSIS — R0602 Shortness of breath: Secondary | ICD-10-CM | POA: Diagnosis not present

## 2023-05-20 ENCOUNTER — Encounter (HOSPITAL_COMMUNITY): Payer: Medicare Other

## 2023-05-20 ENCOUNTER — Inpatient Hospital Stay (HOSPITAL_COMMUNITY)
Admission: EM | Admit: 2023-05-20 | Discharge: 2023-05-24 | DRG: 299 | Disposition: A | Discharge status: Skilled Nursing Facility | Payer: Medicare Other | Attending: Internal Medicine | Admitting: Internal Medicine

## 2023-05-20 ENCOUNTER — Encounter (HOSPITAL_COMMUNITY): Payer: Self-pay

## 2023-05-20 ENCOUNTER — Emergency Department (HOSPITAL_COMMUNITY): Payer: Medicare Other

## 2023-05-20 ENCOUNTER — Other Ambulatory Visit: Payer: Self-pay

## 2023-05-20 DIAGNOSIS — Z1621 Resistance to vancomycin: Secondary | ICD-10-CM | POA: Diagnosis present

## 2023-05-20 DIAGNOSIS — E781 Pure hyperglyceridemia: Secondary | ICD-10-CM | POA: Diagnosis not present

## 2023-05-20 DIAGNOSIS — Z66 Do not resuscitate: Secondary | ICD-10-CM | POA: Diagnosis not present

## 2023-05-20 DIAGNOSIS — Z1611 Resistance to penicillins: Secondary | ICD-10-CM | POA: Diagnosis present

## 2023-05-20 DIAGNOSIS — M6259 Muscle wasting and atrophy, not elsewhere classified, multiple sites: Secondary | ICD-10-CM | POA: Diagnosis not present

## 2023-05-20 DIAGNOSIS — K922 Gastrointestinal hemorrhage, unspecified: Secondary | ICD-10-CM | POA: Diagnosis not present

## 2023-05-20 DIAGNOSIS — I959 Hypotension, unspecified: Secondary | ICD-10-CM | POA: Diagnosis not present

## 2023-05-20 DIAGNOSIS — I130 Hypertensive heart and chronic kidney disease with heart failure and stage 1 through stage 4 chronic kidney disease, or unspecified chronic kidney disease: Secondary | ICD-10-CM | POA: Diagnosis not present

## 2023-05-20 DIAGNOSIS — Z888 Allergy status to other drugs, medicaments and biological substances status: Secondary | ICD-10-CM

## 2023-05-20 DIAGNOSIS — I2699 Other pulmonary embolism without acute cor pulmonale: Secondary | ICD-10-CM | POA: Diagnosis not present

## 2023-05-20 DIAGNOSIS — N179 Acute kidney failure, unspecified: Secondary | ICD-10-CM | POA: Diagnosis present

## 2023-05-20 DIAGNOSIS — I700 Atherosclerosis of aorta: Secondary | ICD-10-CM | POA: Diagnosis not present

## 2023-05-20 DIAGNOSIS — E871 Hypo-osmolality and hyponatremia: Secondary | ICD-10-CM | POA: Diagnosis not present

## 2023-05-20 DIAGNOSIS — N390 Urinary tract infection, site not specified: Secondary | ICD-10-CM | POA: Diagnosis not present

## 2023-05-20 DIAGNOSIS — I5033 Acute on chronic diastolic (congestive) heart failure: Secondary | ICD-10-CM

## 2023-05-20 DIAGNOSIS — J9611 Chronic respiratory failure with hypoxia: Secondary | ICD-10-CM | POA: Diagnosis not present

## 2023-05-20 DIAGNOSIS — Z7901 Long term (current) use of anticoagulants: Secondary | ICD-10-CM

## 2023-05-20 DIAGNOSIS — E1169 Type 2 diabetes mellitus with other specified complication: Secondary | ICD-10-CM | POA: Diagnosis present

## 2023-05-20 DIAGNOSIS — E1122 Type 2 diabetes mellitus with diabetic chronic kidney disease: Secondary | ICD-10-CM | POA: Diagnosis not present

## 2023-05-20 DIAGNOSIS — Z1152 Encounter for screening for COVID-19: Secondary | ICD-10-CM

## 2023-05-20 DIAGNOSIS — F411 Generalized anxiety disorder: Secondary | ICD-10-CM | POA: Diagnosis not present

## 2023-05-20 DIAGNOSIS — R54 Age-related physical debility: Secondary | ICD-10-CM | POA: Diagnosis present

## 2023-05-20 DIAGNOSIS — M7989 Other specified soft tissue disorders: Secondary | ICD-10-CM | POA: Diagnosis not present

## 2023-05-20 DIAGNOSIS — J449 Chronic obstructive pulmonary disease, unspecified: Secondary | ICD-10-CM | POA: Diagnosis not present

## 2023-05-20 DIAGNOSIS — Z7401 Bed confinement status: Secondary | ICD-10-CM | POA: Diagnosis not present

## 2023-05-20 DIAGNOSIS — Z7951 Long term (current) use of inhaled steroids: Secondary | ICD-10-CM

## 2023-05-20 DIAGNOSIS — T81718A Complication of other artery following a procedure, not elsewhere classified, initial encounter: Principal | ICD-10-CM | POA: Diagnosis present

## 2023-05-20 DIAGNOSIS — N1832 Chronic kidney disease, stage 3b: Secondary | ICD-10-CM | POA: Diagnosis not present

## 2023-05-20 DIAGNOSIS — Z515 Encounter for palliative care: Secondary | ICD-10-CM | POA: Diagnosis not present

## 2023-05-20 DIAGNOSIS — I4510 Unspecified right bundle-branch block: Secondary | ICD-10-CM | POA: Diagnosis not present

## 2023-05-20 DIAGNOSIS — I340 Nonrheumatic mitral (valve) insufficiency: Secondary | ICD-10-CM | POA: Diagnosis present

## 2023-05-20 DIAGNOSIS — N183 Chronic kidney disease, stage 3 unspecified: Secondary | ICD-10-CM | POA: Diagnosis present

## 2023-05-20 DIAGNOSIS — I5022 Chronic systolic (congestive) heart failure: Secondary | ICD-10-CM | POA: Diagnosis not present

## 2023-05-20 DIAGNOSIS — R072 Precordial pain: Secondary | ICD-10-CM | POA: Diagnosis present

## 2023-05-20 DIAGNOSIS — R918 Other nonspecific abnormal finding of lung field: Secondary | ICD-10-CM | POA: Diagnosis not present

## 2023-05-20 DIAGNOSIS — I5023 Acute on chronic systolic (congestive) heart failure: Secondary | ICD-10-CM | POA: Diagnosis present

## 2023-05-20 DIAGNOSIS — N189 Chronic kidney disease, unspecified: Secondary | ICD-10-CM | POA: Diagnosis present

## 2023-05-20 DIAGNOSIS — R0602 Shortness of breath: Secondary | ICD-10-CM | POA: Diagnosis not present

## 2023-05-20 DIAGNOSIS — Z471 Aftercare following joint replacement surgery: Secondary | ICD-10-CM

## 2023-05-20 DIAGNOSIS — R5381 Other malaise: Secondary | ICD-10-CM | POA: Diagnosis not present

## 2023-05-20 DIAGNOSIS — R0789 Other chest pain: Secondary | ICD-10-CM | POA: Diagnosis not present

## 2023-05-20 DIAGNOSIS — Z8679 Personal history of other diseases of the circulatory system: Secondary | ICD-10-CM | POA: Diagnosis not present

## 2023-05-20 DIAGNOSIS — Z90711 Acquired absence of uterus with remaining cervical stump: Secondary | ICD-10-CM

## 2023-05-20 DIAGNOSIS — J9811 Atelectasis: Secondary | ICD-10-CM | POA: Diagnosis present

## 2023-05-20 DIAGNOSIS — Z7982 Long term (current) use of aspirin: Secondary | ICD-10-CM

## 2023-05-20 DIAGNOSIS — I2510 Atherosclerotic heart disease of native coronary artery without angina pectoris: Secondary | ICD-10-CM | POA: Diagnosis not present

## 2023-05-20 DIAGNOSIS — I4430 Unspecified atrioventricular block: Secondary | ICD-10-CM | POA: Diagnosis not present

## 2023-05-20 DIAGNOSIS — I440 Atrioventricular block, first degree: Secondary | ICD-10-CM | POA: Diagnosis not present

## 2023-05-20 DIAGNOSIS — R531 Weakness: Secondary | ICD-10-CM | POA: Diagnosis not present

## 2023-05-20 DIAGNOSIS — I452 Bifascicular block: Secondary | ICD-10-CM | POA: Diagnosis not present

## 2023-05-20 DIAGNOSIS — R296 Repeated falls: Secondary | ICD-10-CM | POA: Diagnosis not present

## 2023-05-20 DIAGNOSIS — M109 Gout, unspecified: Secondary | ICD-10-CM | POA: Diagnosis present

## 2023-05-20 DIAGNOSIS — B952 Enterococcus as the cause of diseases classified elsewhere: Secondary | ICD-10-CM | POA: Diagnosis present

## 2023-05-20 DIAGNOSIS — R140 Abdominal distension (gaseous): Secondary | ICD-10-CM | POA: Diagnosis not present

## 2023-05-20 DIAGNOSIS — J439 Emphysema, unspecified: Secondary | ICD-10-CM | POA: Diagnosis not present

## 2023-05-20 DIAGNOSIS — Z86711 Personal history of pulmonary embolism: Secondary | ICD-10-CM | POA: Diagnosis not present

## 2023-05-20 DIAGNOSIS — Z8673 Personal history of transient ischemic attack (TIA), and cerebral infarction without residual deficits: Secondary | ICD-10-CM

## 2023-05-20 DIAGNOSIS — S72001D Fracture of unspecified part of neck of right femur, subsequent encounter for closed fracture with routine healing: Secondary | ICD-10-CM | POA: Diagnosis not present

## 2023-05-20 DIAGNOSIS — I2609 Other pulmonary embolism with acute cor pulmonale: Secondary | ICD-10-CM | POA: Diagnosis not present

## 2023-05-20 DIAGNOSIS — Z8249 Family history of ischemic heart disease and other diseases of the circulatory system: Secondary | ICD-10-CM

## 2023-05-20 DIAGNOSIS — R41841 Cognitive communication deficit: Secondary | ICD-10-CM | POA: Diagnosis not present

## 2023-05-20 DIAGNOSIS — I5021 Acute systolic (congestive) heart failure: Secondary | ICD-10-CM | POA: Diagnosis not present

## 2023-05-20 DIAGNOSIS — Y838 Other surgical procedures as the cause of abnormal reaction of the patient, or of later complication, without mention of misadventure at the time of the procedure: Secondary | ICD-10-CM | POA: Diagnosis present

## 2023-05-20 DIAGNOSIS — F419 Anxiety disorder, unspecified: Secondary | ICD-10-CM | POA: Diagnosis not present

## 2023-05-20 DIAGNOSIS — Z96641 Presence of right artificial hip joint: Secondary | ICD-10-CM | POA: Diagnosis present

## 2023-05-20 DIAGNOSIS — I420 Dilated cardiomyopathy: Secondary | ICD-10-CM | POA: Diagnosis present

## 2023-05-20 DIAGNOSIS — R109 Unspecified abdominal pain: Secondary | ICD-10-CM | POA: Diagnosis not present

## 2023-05-20 DIAGNOSIS — R0600 Dyspnea, unspecified: Secondary | ICD-10-CM | POA: Diagnosis not present

## 2023-05-20 DIAGNOSIS — Z79899 Other long term (current) drug therapy: Secondary | ICD-10-CM

## 2023-05-20 DIAGNOSIS — I252 Old myocardial infarction: Secondary | ICD-10-CM

## 2023-05-20 DIAGNOSIS — J441 Chronic obstructive pulmonary disease with (acute) exacerbation: Secondary | ICD-10-CM | POA: Diagnosis not present

## 2023-05-20 DIAGNOSIS — J90 Pleural effusion, not elsewhere classified: Secondary | ICD-10-CM | POA: Diagnosis not present

## 2023-05-20 DIAGNOSIS — E785 Hyperlipidemia, unspecified: Secondary | ICD-10-CM | POA: Diagnosis not present

## 2023-05-20 DIAGNOSIS — R1312 Dysphagia, oropharyngeal phase: Secondary | ICD-10-CM | POA: Diagnosis not present

## 2023-05-20 DIAGNOSIS — I255 Ischemic cardiomyopathy: Secondary | ICD-10-CM | POA: Diagnosis present

## 2023-05-20 DIAGNOSIS — I499 Cardiac arrhythmia, unspecified: Secondary | ICD-10-CM | POA: Diagnosis not present

## 2023-05-20 DIAGNOSIS — F17210 Nicotine dependence, cigarettes, uncomplicated: Secondary | ICD-10-CM | POA: Diagnosis not present

## 2023-05-20 DIAGNOSIS — N281 Cyst of kidney, acquired: Secondary | ICD-10-CM | POA: Diagnosis not present

## 2023-05-20 DIAGNOSIS — Z7984 Long term (current) use of oral hypoglycemic drugs: Secondary | ICD-10-CM

## 2023-05-20 DIAGNOSIS — R262 Difficulty in walking, not elsewhere classified: Secondary | ICD-10-CM | POA: Diagnosis not present

## 2023-05-20 DIAGNOSIS — K5900 Constipation, unspecified: Secondary | ICD-10-CM | POA: Diagnosis present

## 2023-05-20 DIAGNOSIS — Z9049 Acquired absence of other specified parts of digestive tract: Secondary | ICD-10-CM

## 2023-05-20 DIAGNOSIS — Z4789 Encounter for other orthopedic aftercare: Secondary | ICD-10-CM | POA: Diagnosis not present

## 2023-05-20 DIAGNOSIS — Z955 Presence of coronary angioplasty implant and graft: Secondary | ICD-10-CM

## 2023-05-20 DIAGNOSIS — Z87891 Personal history of nicotine dependence: Secondary | ICD-10-CM

## 2023-05-20 DIAGNOSIS — Z9981 Dependence on supplemental oxygen: Secondary | ICD-10-CM

## 2023-05-20 LAB — COMPREHENSIVE METABOLIC PANEL
ALT: 12 U/L (ref 0–44)
AST: 17 U/L (ref 15–41)
Albumin: 2.7 g/dL — ABNORMAL LOW (ref 3.5–5.0)
Alkaline Phosphatase: 62 U/L (ref 38–126)
Anion gap: 14 (ref 5–15)
BUN: 70 mg/dL — ABNORMAL HIGH (ref 8–23)
CO2: 29 mmol/L (ref 22–32)
Calcium: 8.7 mg/dL — ABNORMAL LOW (ref 8.9–10.3)
Chloride: 93 mmol/L — ABNORMAL LOW (ref 98–111)
Creatinine, Ser: 2.29 mg/dL — ABNORMAL HIGH (ref 0.44–1.00)
GFR, Estimated: 21 mL/min — ABNORMAL LOW (ref >60)
Glucose, Bld: 176 mg/dL — ABNORMAL HIGH (ref 70–99)
Potassium: 4.5 mmol/L (ref 3.5–5.1)
Sodium: 136 mmol/L (ref 135–145)
Total Bilirubin: 0.5 mg/dL (ref 0.3–1.2)
Total Protein: 6.3 g/dL — ABNORMAL LOW (ref 6.5–8.1)

## 2023-05-20 LAB — URINALYSIS, W/ REFLEX TO CULTURE (INFECTION SUSPECTED)
Bilirubin Urine: NEGATIVE
Glucose, UA: 150 mg/dL — AB
Ketones, ur: NEGATIVE mg/dL
Nitrite: NEGATIVE
Protein, ur: NEGATIVE mg/dL
Specific Gravity, Urine: 1.012 (ref 1.005–1.030)
pH: 7 (ref 5.0–8.0)

## 2023-05-20 LAB — TROPONIN I (HIGH SENSITIVITY)
Troponin I (High Sensitivity): 26 ng/L — ABNORMAL HIGH (ref <18)
Troponin I (High Sensitivity): 24 ng/L — ABNORMAL HIGH (ref <18)

## 2023-05-20 LAB — CBC WITH DIFFERENTIAL/PLATELET
Abs Immature Granulocytes: 0.03 10*3/uL (ref 0.00–0.07)
Basophils Absolute: 0 10*3/uL (ref 0.0–0.1)
Basophils Relative: 0 %
Eosinophils Absolute: 0 10*3/uL (ref 0.0–0.5)
Eosinophils Relative: 0 %
HCT: 31.9 % — ABNORMAL LOW (ref 36.0–46.0)
Hemoglobin: 9.6 g/dL — ABNORMAL LOW (ref 12.0–15.0)
Immature Granulocytes: 0 %
Lymphocytes Relative: 9 %
Lymphs Abs: 0.8 10*3/uL (ref 0.7–4.0)
MCH: 27.7 pg (ref 26.0–34.0)
MCHC: 30.1 g/dL (ref 30.0–36.0)
MCV: 91.9 fL (ref 80.0–100.0)
Monocytes Absolute: 0.9 10*3/uL (ref 0.1–1.0)
Monocytes Relative: 10 %
Neutro Abs: 7.2 10*3/uL (ref 1.7–7.7)
Neutrophils Relative %: 81 %
Platelets: 335 10*3/uL (ref 150–400)
RBC: 3.47 MIL/uL — ABNORMAL LOW (ref 3.87–5.11)
RDW: 19.5 % — ABNORMAL HIGH (ref 11.5–15.5)
WBC: 9 10*3/uL (ref 4.0–10.5)
nRBC: 0 % (ref 0.0–0.2)

## 2023-05-20 LAB — I-STAT CHEM 8, ED
BUN: 63 mg/dL — ABNORMAL HIGH (ref 8–23)
Calcium, Ion: 1.07 mmol/L — ABNORMAL LOW (ref 1.15–1.40)
Chloride: 95 mmol/L — ABNORMAL LOW (ref 98–111)
Creatinine, Ser: 2.1 mg/dL — ABNORMAL HIGH (ref 0.44–1.00)
Glucose, Bld: 180 mg/dL — ABNORMAL HIGH (ref 70–99)
HCT: 31 % — ABNORMAL LOW (ref 36.0–46.0)
Hemoglobin: 10.5 g/dL — ABNORMAL LOW (ref 12.0–15.0)
Potassium: 4.5 mmol/L (ref 3.5–5.1)
Sodium: 134 mmol/L — ABNORMAL LOW (ref 135–145)
TCO2: 29 mmol/L (ref 22–32)

## 2023-05-20 LAB — GLUCOSE, CAPILLARY: Glucose-Capillary: 132 mg/dL — ABNORMAL HIGH (ref 70–99)

## 2023-05-20 LAB — SARS CORONAVIRUS 2 BY RT PCR: SARS Coronavirus 2 by RT PCR: NEGATIVE

## 2023-05-20 LAB — BRAIN NATRIURETIC PEPTIDE: B Natriuretic Peptide: 2985.3 pg/mL — ABNORMAL HIGH (ref 0.0–100.0)

## 2023-05-20 LAB — HEPARIN LEVEL (UNFRACTIONATED): Heparin Unfractionated: 0.72 IU/mL — ABNORMAL HIGH (ref 0.30–0.70)

## 2023-05-20 MED ORDER — SODIUM CHLORIDE 0.9 % IV BOLUS: 500.0000 mL | Freq: Once | INTRAVENOUS | Status: DC

## 2023-05-20 MED ORDER — UMECLIDINIUM-VILANTEROL 62.5-25 MCG/ACT IN AEPB
1.0000 | INHALATION_SPRAY | Freq: Every day | RESPIRATORY_TRACT | Status: DC
  Administered 2023-05-21 – 2023-05-24 (×4): 1 via RESPIRATORY_TRACT
  Filled 2023-05-20 (×2): qty 14

## 2023-05-20 MED ORDER — ONDANSETRON HCL 4 MG/2ML IJ SOLN: 4.0000 mg | Freq: Four times a day (QID) | INTRAMUSCULAR | Status: DC | PRN

## 2023-05-20 MED ORDER — HEPARIN SODIUM (PORCINE) 5000 UNIT/ML IJ SOLN: 5000.0000 [IU] | Freq: Three times a day (TID) | INTRAMUSCULAR | Status: DC

## 2023-05-20 MED ORDER — MELATONIN 5 MG PO TABS
5.0000 mg | ORAL_TABLET | Freq: Every day | ORAL | Status: DC
  Administered 2023-05-20 – 2023-05-23 (×4): 5 mg via ORAL
  Filled 2023-05-20 (×4): qty 1

## 2023-05-20 MED ORDER — ATORVASTATIN CALCIUM 80 MG PO TABS
80.0000 mg | ORAL_TABLET | Freq: Every day | ORAL | Status: DC
  Administered 2023-05-20 – 2023-05-24 (×5): 80 mg via ORAL
  Filled 2023-05-20 (×3): qty 1
  Filled 2023-05-20: qty 2
  Filled 2023-05-20: qty 1

## 2023-05-20 MED ORDER — SODIUM CHLORIDE 0.9 % IV SOLN
1.0000 g | INTRAVENOUS | Status: DC
  Administered 2023-05-21: 1 g via INTRAVENOUS
  Filled 2023-05-20: qty 10

## 2023-05-20 MED ORDER — ALBUTEROL SULFATE (2.5 MG/3ML) 0.083% IN NEBU
3.0000 mL | INHALATION_SOLUTION | Freq: Four times a day (QID) | RESPIRATORY_TRACT | Status: DC | PRN
  Administered 2023-05-21 – 2023-05-24 (×7): 3 mL via RESPIRATORY_TRACT
  Filled 2023-05-20 (×7): qty 3

## 2023-05-20 MED ORDER — NITROGLYCERIN 0.4 MG SL SUBL: 0.4000 mg | SUBLINGUAL_TABLET | SUBLINGUAL | Status: DC | PRN

## 2023-05-20 MED ORDER — SPIRONOLACTONE 12.5 MG HALF TABLET: 50.0000 mg | ORAL_TABLET | Freq: Every day | ORAL | Status: DC

## 2023-05-20 MED ORDER — BUSPIRONE HCL 10 MG PO TABS
10.0000 mg | ORAL_TABLET | Freq: Three times a day (TID) | ORAL | Status: DC
  Administered 2023-05-20 – 2023-05-24 (×11): 10 mg via ORAL
  Filled 2023-05-20 (×11): qty 1

## 2023-05-20 MED ORDER — ACETAMINOPHEN 500 MG PO TABS: 500.0000 mg | ORAL_TABLET | Freq: Four times a day (QID) | ORAL | Status: DC | PRN

## 2023-05-20 MED ORDER — HEPARIN BOLUS VIA INFUSION
4000.0000 [IU] | Freq: Once | INTRAVENOUS | Status: AC
  Administered 2023-05-20: 4000 [IU] via INTRAVENOUS
  Filled 2023-05-20: qty 4000

## 2023-05-20 MED ORDER — FERROUS SULFATE 325 (65 FE) MG PO TABS
325.0000 mg | ORAL_TABLET | Freq: Three times a day (TID) | ORAL | Status: DC
  Administered 2023-05-21 – 2023-05-24 (×10): 325 mg via ORAL
  Filled 2023-05-20 (×10): qty 1

## 2023-05-20 MED ORDER — SODIUM CHLORIDE 0.9 % IV SOLN: 250.0000 mL | INTRAVENOUS | Status: DC | PRN

## 2023-05-20 MED ORDER — ACETAMINOPHEN 325 MG PO TABS
650.0000 mg | ORAL_TABLET | Freq: Four times a day (QID) | ORAL | Status: DC | PRN
  Administered 2023-05-20: 650 mg via ORAL
  Filled 2023-05-20: qty 2

## 2023-05-20 MED ORDER — ONDANSETRON HCL 4 MG PO TABS
4.0000 mg | ORAL_TABLET | Freq: Four times a day (QID) | ORAL | Status: DC | PRN
  Administered 2023-05-21: 4 mg via ORAL
  Filled 2023-05-20: qty 1

## 2023-05-20 MED ORDER — SODIUM CHLORIDE 0.9% FLUSH: 3.0000 mL | INTRAVENOUS | Status: DC | PRN

## 2023-05-20 MED ORDER — SODIUM CHLORIDE 0.9 % IV SOLN
1.0000 g | Freq: Once | INTRAVENOUS | Status: AC
  Administered 2023-05-20: 1 g via INTRAVENOUS
  Filled 2023-05-20: qty 10

## 2023-05-20 MED ORDER — MINERAL OIL RE ENEM
1.0000 | ENEMA | Freq: Once | RECTAL | Status: AC
  Administered 2023-05-20: 1 via RECTAL
  Filled 2023-05-20 (×2): qty 1

## 2023-05-20 MED ORDER — ADULT MULTIVITAMIN W/MINERALS CH
ORAL_TABLET | Freq: Every day | ORAL | Status: DC
  Administered 2023-05-21 – 2023-05-24 (×4): 1 via ORAL
  Filled 2023-05-20 (×4): qty 1

## 2023-05-20 MED ORDER — POLYETHYLENE GLYCOL 3350 17 G PO PACK
34.0000 g | PACK | Freq: Every day | ORAL | Status: DC
  Administered 2023-05-21 – 2023-05-24 (×2): 34 g via ORAL
  Filled 2023-05-20 (×4): qty 2

## 2023-05-20 MED ORDER — ACETAMINOPHEN 650 MG RE SUPP: 650.0000 mg | Freq: Four times a day (QID) | RECTAL | Status: DC | PRN

## 2023-05-20 MED ORDER — HYDROCODONE-ACETAMINOPHEN 5-325 MG PO TABS
1.0000 | ORAL_TABLET | ORAL | Status: DC | PRN
  Administered 2023-05-20 – 2023-05-21 (×2): 1 via ORAL
  Administered 2023-05-21: 2 via ORAL
  Administered 2023-05-21: 1 via ORAL
  Administered 2023-05-21 – 2023-05-24 (×12): 2 via ORAL
  Filled 2023-05-20 (×3): qty 2
  Filled 2023-05-20: qty 1
  Filled 2023-05-20 (×3): qty 2
  Filled 2023-05-20: qty 1
  Filled 2023-05-20 (×5): qty 2
  Filled 2023-05-20: qty 1
  Filled 2023-05-20 (×2): qty 2

## 2023-05-20 MED ORDER — HEPARIN (PORCINE) 25000 UT/250ML-% IV SOLN
1200.0000 [IU]/h | INTRAVENOUS | Status: DC
  Administered 2023-05-20 – 2023-05-21 (×2): 1200 [IU]/h via INTRAVENOUS
  Filled 2023-05-20 (×2): qty 250

## 2023-05-20 MED ORDER — ENOXAPARIN SODIUM 40 MG/0.4ML IJ SOSY: 30.0000 mg | PREFILLED_SYRINGE | INTRAMUSCULAR | Status: DC

## 2023-05-20 MED ORDER — VITAMIN D 25 MCG (1000 UNIT) PO TABS
10000.0000 [IU] | ORAL_TABLET | Freq: Every day | ORAL | Status: DC
  Administered 2023-05-21 – 2023-05-24 (×4): 10000 [IU] via ORAL
  Filled 2023-05-20 (×4): qty 10

## 2023-05-20 MED ORDER — SODIUM CHLORIDE 0.9% FLUSH
3.0000 mL | Freq: Two times a day (BID) | INTRAVENOUS | Status: DC
  Administered 2023-05-20 – 2023-05-24 (×8): 3 mL via INTRAVENOUS

## 2023-05-20 MED ORDER — ASPIRIN 81 MG PO TBEC
81.0000 mg | DELAYED_RELEASE_TABLET | Freq: Every day | ORAL | Status: DC
  Administered 2023-05-21 – 2023-05-24 (×4): 81 mg via ORAL
  Filled 2023-05-20 (×4): qty 1

## 2023-05-20 MED ORDER — SODIUM CHLORIDE 0.9 % IV SOLN
500.0000 mg | Freq: Once | INTRAVENOUS | Status: AC
  Administered 2023-05-20: 500 mg via INTRAVENOUS
  Filled 2023-05-20: qty 5

## 2023-05-20 MED ORDER — CARVEDILOL 6.25 MG PO TABS
6.2500 mg | ORAL_TABLET | Freq: Two times a day (BID) | ORAL | Status: DC
  Administered 2023-05-20 – 2023-05-24 (×8): 6.25 mg via ORAL
  Filled 2023-05-20 (×7): qty 1
  Filled 2023-05-20: qty 2

## 2023-05-20 MED ORDER — IOHEXOL 350 MG/ML SOLN
70.0000 mL | Freq: Once | INTRAVENOUS | Status: AC | PRN
  Administered 2023-05-20: 70 mL via INTRAVENOUS

## 2023-05-20 MED ORDER — FUROSEMIDE 10 MG/ML IJ SOLN
40.0000 mg | Freq: Once | INTRAMUSCULAR | Status: AC
  Administered 2023-05-20: 40 mg via INTRAVENOUS
  Filled 2023-05-20: qty 4

## 2023-05-20 MED ORDER — INSULIN ASPART 100 UNIT/ML IJ SOLN
0.0000 [IU] | Freq: Three times a day (TID) | INTRAMUSCULAR | Status: DC
  Administered 2023-05-21 (×2): 2 [IU] via SUBCUTANEOUS
  Administered 2023-05-21: 3 [IU] via SUBCUTANEOUS
  Administered 2023-05-22 – 2023-05-23 (×5): 2 [IU] via SUBCUTANEOUS
  Administered 2023-05-23: 3 [IU] via SUBCUTANEOUS
  Administered 2023-05-24: 2 [IU] via SUBCUTANEOUS

## 2023-05-20 MED ORDER — FUROSEMIDE 10 MG/ML IJ SOLN: 40.0000 mg | Freq: Every day | INTRAMUSCULAR | Status: DC

## 2023-05-20 MED ORDER — PANTOPRAZOLE SODIUM 40 MG PO TBEC
40.0000 mg | DELAYED_RELEASE_TABLET | Freq: Every day | ORAL | Status: DC
  Administered 2023-05-21 – 2023-05-24 (×4): 40 mg via ORAL
  Filled 2023-05-20 (×4): qty 1

## 2023-05-20 NOTE — Assessment & Plan Note (Signed)
Status post right hip hemiarthroplasty which was done on 05/03/23 for hip fracture We will request PT consult

## 2023-05-20 NOTE — Assessment & Plan Note (Signed)
Status post stent angioplasty Continue aspirin, carvedilol and atorvastatin

## 2023-05-20 NOTE — H&P (Addendum)
History and Physical    Patient: Angela Lucero ZOX:[id number] DOB: [birth year] DOA: 05/20/2023 DOS: the patient was seen and examined on 05/20/2023 PCP: [name], MD  Patient coming from: SNF  Chief Complaint:  Chief Complaint  Patient presents with   Abdominal Pain   HPI: Angela Lucero is a 82 y.o. 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  Patient is status post right hip hemiarthroplasty  which was done on 09/03 and was discharged to SNF for subacute rehab.  She was discharged on Lovenox for 4 weeks but patient is unsure if she received Lovenox at the skilled nursing facility. She presents to the ER for evaluation of midsternal chest pain associated with worsening shortness of breath.  She rated her pain a 7 x 10 in intensity at its worst.  Pain was nonradiating and she denied having any associated nausea, no vomiting, no diaphoresis or palpitations.  She states that the pain feels like her prior heart attacks.  She also complains of frequency and dysuria but denies having any hematuria.  She has abdominal pain mostly in the suprapubic area and has had constipation. She denies having any cough, no fever, no chills, no dizziness, no lightheadedness, no diarrhea, no focal deficits or blurred vision. Abnormal labs include a BNP of 2985, BUN 70, creatinine 2.29 compared to baseline of 1.27, pyuria, troponin 26 >> 24 Chest x-ray reviewed by me shows layering bilateral pleural effusions with probable underlying compressive atelectatic changes in the bilateral lower lobes. CT angiogram of the chest/CT scan of abdomen and pelvis  shows small volume nonocclusive pulmonary embolism in the bilateral lower lobe distal segmental and proximal subsegmental pulmonary artery levels. No right heart strain or lung infarction. Trace left and small right pleural effusions with associated compressive atelectatic changes in the bilateral lower lobes. Patchy areas of smooth interlobular septal thickening throughout bilateral lungs, which can be seen with congestive heart failure/pulmonary edema. Patchy opacities in the left lung lower lobe and apicoposterior segment of left upper lobe, favored to represent multilobar pneumonia. Aortic Atherosclerosis  and Emphysema . She received a dose of Rocephin and Zithromax in the ER and has been started on a heparin drip.   Review of Systems: As mentioned in the history of present illness. All other systems reviewed and are negative. Past Medical History:  Diagnosis Date   Acute exacerbation of CHF (congestive heart failure) (HCC) 12/10/2021   Acute hypoxic respiratory failure (HCC) 02/04/2023   Acute on chronic HFrEF (heart failure with reduced ejection fraction) (HCC) 02/05/2023   Acute on chronic hypoxic respiratory failure (HCC) 02/05/2023   Acute systolic heart failure (HCC) 12/14/2021   AKI (acute kidney injury) (HCC) 02/06/2023   Anemia 02/13/2023   Anxiety disorder, unspecified 03/16/2023   Arthritis 2010   Lower back and neck   Arthritis of both feet 02/01/2018   CAD (coronary artery disease)    Cardiomyopathy (HCC) 01/01/2022   Chronic edema 12/18/2021   Bilateral lower extremities         Chronic hypoxic respiratory failure (HCC) 05/01/2023   Chronic kidney disease (CKD), stage III (moderate) (HCC) 05/01/2023   Closed right hip fracture,  initial encounter (HCC) 05/01/2023   Cognitive communication deficit 03/16/2023   Congestive heart failure (CHF) (HCC) 12/10/2021   COPD (chronic obstructive pulmonary disease) (HCC)    COPD exacerbation (HCC) 02/05/2023   Coping style affecting  medical condition 02/23/2023   Coronary artery disease PTCA and stenting in 1997 and 2005 in [place] 12/10/2021   CVA (cerebral vascular accident) (HCC)    1997, 2005 stents   Debility 02/10/2023   Diabetes (HCC)    Diabetes mellitus, type II (HCC)    Displaced fracture of right femoral neck (HCC) 05/01/2023   Dyslipidemia 12/10/2021   Dyslipidemia associated with type 2 diabetes mellitus (HCC) 02/01/2018   Dysphagia, oropharyngeal phase 03/16/2023   Elevated troponin 02/04/2023   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  rigid, progressive flat foot deformity of BOTH feet     GAD (generalized anxiety disorder) 03/16/2023   Gout 1998   Heart attack (HCC)    4132,4401   History of CAD (coronary artery disease) 05/01/2023   Hyperglycemia, unspecified 03/16/2023   Hyperlipidemia    Hypertension    Hypertriglyceridemia    Hyponatremia 02/04/2023   Melanotic stools 02/13/2023   Muscle wasting and atrophy, not elsewhere classified, multiple sites 03/16/2023   Nicotine dependence, cigarettes, uncomplicated 03/16/2023   Non-ST elevation (NSTEMI) myocardial infarction (HCC) 12/14/2021   Nonrheumatic mitral (valve) insufficiency 03/16/2023   Other malaise 03/16/2023   Other nonthrombocytopenic purpura (HCC) 03/16/2023   Pneumonia, unspecified organism 03/16/2023   Preop cardiovascular exam 12/10/2021   Smoking 12/10/2021   Type 2 diabetes mellitus with complication, without long-term current use of insulin (HCC) 02/01/2018   Past Surgical History:  Procedure Laterality Date   APPENDECTOMY  1971   CHOLECYSTECTOMY     LEFT HEART CATH AND CORONARY ANGIOGRAPHY N/A 12/14/2021   Procedure: LEFT HEART CATH AND CORONARY ANGIOGRAPHY;  Surgeon: Yvonne Kendall, MD;  Location: [facility];  Service: Cardiovascular;  Laterality: N/A;   Navel hernia repair  2007   PARTIAL HYSTERECTOMY  1976   Stent Heart     TOTAL HIP ARTHROPLASTY Right  05/03/2023   Procedure: POSTERIOR HEMI HIP;  Surgeon: Teryl Lucy, MD;  Location: [facility] OR;  Service: Orthopedics;  Laterality: Right;   TUBAL LIGATION  1974   Ulna nerve transpost L arm     Social History:  reports that she quit smoking about 3 months ago. Her smoking use included cigarettes. She has never used smokeless tobacco. She reports that she does not currently use alcohol. She reports that she does not use drugs.  No Known Allergies  Family History  Problem Relation Age of Onset   Heart disease Mother    Hypertension Mother    Heart disease Father    Hypertension Father    Cancer Brother     Prior to Admission medications   Medication Sig Start Date End Date Taking? Authorizing Provider  acetaminophen (TYLENOL) 500 MG tablet Take 500 mg by mouth every 6 (six) hours as needed for mild pain.    [provider]  albuterol (VENTOLIN HFA) 108 (90 Base) MCG/ACT inhaler Inhale 2 puffs into the lungs every 6 (six) hours as needed for wheezing or shortness of breath. 04/26/23   [name], MD  aspirin EC 81 MG tablet Take 81 mg by mouth daily. Swallow whole.    [provider]  atorvastatin (LIPITOR) 80 MG tablet TAKE 1 TABLET BY MOUTH EVERY DAY 05/19/23   [name],  MD  busPIRone (BUSPAR) 10 MG tablet TAKE 1 TABLET BY MOUTH THREE TIMES A DAY 05/19/23   [name], MD  carvedilol (COREG) 6.25 MG tablet Take 1 tablet (6.25 mg total) by mouth 2 (two) times daily with a meal. 05/09/23 06/08/23  Dorcas Carrow, MD  Cetirizine-Pseudoephedrine (ALLERGY RELIEF D PO) Take 1 tablet by mouth daily.    [provider]  Cholecalciferol (VITAMIN D3) 250 MCG (10000 UT) TABS Take 10,000 Units by mouth daily at 6 (six) AM. 05/09/23   Dorcas Carrow, MD  clonazePAM (KLONOPIN) 0.5 MG tablet Take 1 tablet (0.5 mg total) by mouth 2 (two) times daily as needed for up to 5 days for anxiety. 05/09/23 05/14/23  Dorcas Carrow, MD  docusate sodium (COLACE) 100 MG capsule Take 1 capsule (100 mg  total) by mouth 2 (two) times daily. 05/09/23   Dorcas Carrow, MD  empagliflozin (JARDIANCE) 25 MG TABS tablet Take 1 tablet (25 mg total) by mouth daily. 04/26/23   [name], MD  enoxaparin (LOVENOX) 40 MG/0.4ML injection Inject 0.3 mLs (30 mg total) into the skin daily for 25 days. For DVT prophylaxis after surgery 05/09/23 06/03/23  Dorcas Carrow, MD  ferrous sulfate 325 (65 FE) MG tablet Take 1 tablet (325 mg total) by mouth 3 (three) times daily after meals. 05/09/23   Dorcas Carrow, MD  HYDROcodone-acetaminophen (NORCO) 5-325 MG tablet Take 1-2 tablets by mouth every 4 (four) hours as needed for severe pain or moderate pain. 1 tablets for moderate pain, 2 tablet for severe pain 05/09/23   Dorcas Carrow, MD  magnesium oxide (MAG-OX) 400 (240 Mg) MG tablet Take 1 tablet (400 mg total) by mouth daily. 04/28/23   [name], MD  melatonin 5 MG TABS Take 1 tablet (5 mg total) by mouth at bedtime. 02/24/23   Angiulli, Mcarthur Rossetti, PA-C  Multiple Vitamin (MULTIVITAMIN ADULT PO) Take 1 tablet by mouth daily.    [provider]  nitroGLYCERIN (NITROSTAT) 0.4 MG SL tablet Place 1 tablet (0.4 mg total) under the tongue every 5 (five) minutes as needed for chest pain. 02/24/23   Angiulli, Mcarthur Rossetti, PA-C  Omega 3 1000 MG CAPS Take 2 capsules by mouth in the morning and at bedtime.    [provider]  pantoprazole (PROTONIX) 40 MG tablet Take 1 tablet (40 mg total) by mouth daily. 04/26/23   [name], MD  polyethylene glycol (MIRALAX / GLYCOLAX) 17 g packet Take 34 g by mouth daily. 05/09/23   Dorcas Carrow, MD  spironolactone (ALDACTONE) 50 MG tablet Take 1 tablet (50 mg total) by mouth daily. 04/26/23   [name], MD  torsemide (DEMADEX) 20 MG tablet Take 3 tablets (60 mg total) by mouth 2 (two) times daily. 04/26/23   [name], MD  umeclidinium-vilanterol [facility] ELLIPTA) 62.5-25 MCG/ACT AEPB Inhale 1 puff into the lungs daily. 04/26/23   [name], MD    Physical Exam: Vitals:   05/20/23 1214  05/20/23 1215 05/20/23 1230 05/20/23 1245  BP:  111/61 (!) 104/59 107/63  Pulse:  70 72 71  Resp:  15 (!) 27 (!) 22  Temp:      TempSrc:      SpO2:  99% 99% 96%  Weight: 74.8 kg     Height: 5\' 6"  (1.676 m)      Physical Exam Vitals and nursing note reviewed.  Constitutional:      Appearance: She is well-developed.  HENT:     Head: Normocephalic.  Cardiovascular:  Rate and Rhythm: Normal rate and regular rhythm.  Pulmonary:     Breath sounds: Rales present.  Abdominal:     General: Abdomen is flat. Bowel sounds are normal.     Palpations: Abdomen is soft.     Tenderness: There is abdominal tenderness in the suprapubic area.  Skin:    General: Skin is warm and dry.     Comments: Dressing over right hip Left leg swelling >> Right leg  Neurological:     Mental Status: She is alert.     Motor: Weakness present.  Psychiatric:        Mood and Affect: Mood normal.        Behavior: Behavior normal.     Data Reviewed: Relevant notes from primary care and specialist visits, past discharge summaries as available in EHR, including Care Everywhere. Prior diagnostic testing as pertinent to current admission diagnoses Updated medications and problem lists for reconciliation ED course, including vitals, labs, imaging, treatment and response to treatment Triage notes, nursing and pharmacy notes and ED provider's notes Notable results as noted in HPI Labs reviewed.  Troponin 26 >> 24, BNP 2985, sodium 136, potassium 4.5, chloride 93, bicarb 29, glucose 176, BUN 70, creatinine 2.29, calcium 8.7, total protein 6.3, albumin 2.7, AST 17, ALT 12, alkaline phosphatase 62, white count 9.0, hemoglobin 9.6, hematocrit 31.9, platelet count 335 There are no new results to review at this time.  Assessment and Plan: * Acute pulmonary embolism (HCC) Patient presents to the ER for evaluation of midsternal chest pain associated with worsening shortness of breath from her baseline. She had a recent  right hip hemiarthroplasty following a fracture and was supposed to be on Lovenox postop but is unsure if she received it D-dimer was elevated and CT angiogram shows small volume nonocclusive pulmonary embolism in the bilateral lower lobe distal segmental and proximal subsegmental pulmonary artery levels. No right heart strain or lung infarction. Continue heparin drip initiated in the ER Obtain lower extremity venous Dopplers to rule out DVT  Acute on chronic systolic CHF (congestive heart failure) (HCC) Secondary to dilated ischemic cardiomyopathy Patient has a last known LVEF of 20 to 25% Continue carvedilol Farxiga, torsemide and spironolactone are on hold due to worsening renal function Maintain low-sodium diet Check daily weights   AKI (acute kidney injury) (HCC) Patient has a baseline serum creatinine of 1.27 and today on admission it is 2.29 with elevated BUN. Monitor renal function closely while on diuretic therapy  History of hemiarthroplasty of right hip Status post right hip hemiarthroplasty which was done on 05/03/23 for hip fracture We will request PT consult  UTI (urinary tract infection) Symptomatic Patient has dysuria and frequency of urination.  She also has pyuria Will place patient empirically on Rocephin 1 g IV daily Follow-up results of urine culture  CKD stage 3 due to type 2 diabetes mellitus (HCC) Patient has stage IIIa chronic kidney disease secondary to known diabetes Maintain consistent carbohydrate diet Sliding scale insulin for glycemic control  Chronic hypoxic respiratory failure (HCC) Secondary to COPD Continue oxygen supplementation at 2 to 3 L to maintain pulse oximetry greater than 92%  History of CAD (coronary artery disease) Status post stent angioplasty Continue aspirin, carvedilol and atorvastatin  GAD (generalized anxiety disorder) Continue buspirone      Advance Care Planning:   Code Status: Do not attempt resuscitation (DNR) -  Comfort care   Consults: PT  Family Communication: Plan of care was discussed with patient in detail.  All questions and concerns have been addressed.  She verbalizes understanding and agrees to the plan.  CODE STATUS was discussed and she wishes to be a DO NOT RESUSCITATE  Severity of Illness: The appropriate patient status for this patient is INPATIENT. Inpatient status is judged to be reasonable and necessary in order to provide the required intensity of service to ensure the patient's safety. The patient's presenting symptoms, physical exam findings, and initial radiographic and laboratory data in the context of their chronic comorbidities is felt to place them at high risk for further clinical deterioration. Furthermore, it is not anticipated that the patient will be medically stable for discharge from the hospital within 2 midnights of admission.   * I certify that at the point of admission it is my clinical judgment that the patient will require inpatient hospital care spanning beyond 2 midnights from the point of admission due to high intensity of service, high risk for further deterioration and high frequency of surveillance required.*  Author: Lucile Shutters, MD 05/20/2023 5:05 PM  For on call review [email or web address].

## 2023-05-20 NOTE — Progress Notes (Signed)
ANTICOAGULATION CONSULT NOTE Pharmacy Consult for heparin Indication: pulmonary embolus Brief A/P: Heparin level slightly supratherapeutic No change to heparin for now   No Known Allergies  Patient Measurements: Height: 5\' 6"  (167.6 cm) Weight: 74.8 kg (165 lb) IBW/kg (Calculated) : 59.3 Heparin Dosing Weight: 74 kg   Vital Signs: Temp: 97.5 F (36.4 C) (09/20 2206) Temp Source: Oral (09/20 2206) BP: 102/58 (09/20 2206) Pulse Rate: 69 (09/20 2206)  Labs: Recent Labs    05/20/23 1230 05/20/23 1238 05/20/23 1505 05/20/23 2302  HGB 9.6* 10.5*  --   --   HCT 31.9* 31.0*  --   --   PLT 335  --   --   --   HEPARINUNFRC  --   --   --  0.72*  CREATININE 2.29* 2.10*  --   --   TROPONINIHS 26*  --  24*  --     Estimated Creatinine Clearance: 22.1 mL/min (A) (by C-G formula based on SCr of 2.1 mg/dL (H)).  Assessment:  82 y.o. female with PE for heparin  Goal of Therapy:  Heparin level 0.3-0.7 units/ml Monitor platelets by anticoagulation protocol: Yes   Plan:  No change to heparin for now Follow-up am labs and adjust heparin if needed at that time  Geannie Risen, PharmD, BCPS

## 2023-05-20 NOTE — Assessment & Plan Note (Addendum)
Significant LV systolic dysfunction, with no valvular disease.   Documented urine output is 250 ml  Plan to continue diuresis with furosemide, resume with 40 mg IV q12 hrs.  Resume SGLT 2 inh.

## 2023-05-20 NOTE — Assessment & Plan Note (Signed)
Continue buspirone

## 2023-05-20 NOTE — Assessment & Plan Note (Signed)
Patient presents to the ER for evaluation of midsternal chest pain associated with worsening shortness of breath from her baseline. She had a recent right hip hemiarthroplasty following a fracture and was supposed to be on Lovenox postop but is unsure if she received it D-dimer was elevated and CT angiogram shows small volume nonocclusive pulmonary embolism in the bilateral lower lobe distal segmental and proximal subsegmental pulmonary artery levels. No right heart strain or lung infarction. Continue heparin drip initiated in the ER Obtain lower extremity venous Dopplers to rule out DVT

## 2023-05-20 NOTE — ED Notes (Signed)
ED TO INPATIENT HANDOFF REPORT  ED Nurse Name and Phone #: Randall Hiss [phone]  S Name/Age/Gender Angela Lucero 82 y.o. female Room/Bed: 041C/041C  Code Status   Code Status: Do not attempt resuscitation (DNR) - Comfort care  Home/SNF/Other Home Patient oriented to: self, place, time, and situation Is this baseline? Yes   Triage Complete: Triage complete  Chief Complaint Acute pulmonary embolism (HCC) [I26.99]  Triage Note Per EMS and pt report, pt has been having abd pain and distention for 3 days. Pt states they came to her bedside and had a scan. Sent to the ED to R/O bowel obstruction. Pt also reports being constipated for 3 days. Currently being treated for UTI. Pt here from [facility].   Allergies No Known Allergies  Level of Care/Admitting Diagnosis ED Disposition     ED Disposition  Admit   Condition  --   Comment  Hospital Area: [facility] [[id number]]  Level of Care: Progressive [102]  Admit to Progressive based on following criteria: CARDIOVASCULAR & THORACIC of moderate stability with acute coronary syndrome symptoms/low risk myocardial infarction/hypertensive urgency/arrhythmias/heart failure potentially compromising stability and stable post cardiovascular intervention patients.  May admit patient to [facility] or [facility] if equivalent level of care is available:: Yes  Covid Evaluation: Asymptomatic - no recent exposure (last 10 days) testing not required  Diagnosis: Acute pulmonary embolism [facility]) [[id number]]  Admitting Physician: Lonia Mad  Attending Physician: Lonia Mad  Certification:: I certify this patient will need inpatient services for at least 2 midnights  Expected Medical Readiness: 05/23/2023          B Medical/Surgery History Past Medical History:  Diagnosis Date   Acute exacerbation of CHF (congestive heart failure) (HCC) 12/10/2021   Acute hypoxic respiratory failure  (HCC) 02/04/2023   Acute on chronic HFrEF (heart failure with reduced ejection fraction) (HCC) 02/05/2023   Acute on chronic hypoxic respiratory failure (HCC) 02/05/2023   Acute systolic heart failure (HCC) 12/14/2021   AKI (acute kidney injury) (HCC) 02/06/2023   Anemia 02/13/2023   Anxiety disorder, unspecified 03/16/2023   Arthritis 2010   Lower back and neck   Arthritis of both feet 02/01/2018   CAD (coronary artery disease)    Cardiomyopathy (HCC) 01/01/2022   Chronic edema 12/18/2021   Bilateral lower extremities         Chronic hypoxic respiratory failure (HCC) 05/01/2023   Chronic kidney disease (CKD), stage III (moderate) (HCC) 05/01/2023   Closed right hip fracture, initial encounter (HCC) 05/01/2023   Cognitive communication deficit 03/16/2023   Congestive heart failure (CHF) (HCC) 12/10/2021   COPD (chronic obstructive pulmonary disease) (HCC)    COPD exacerbation (HCC) 02/05/2023   Coping style affecting medical condition 02/23/2023   Coronary artery disease PTCA and stenting in 1997 and 2005 in [place] 12/10/2021   CVA (cerebral vascular accident) (HCC)    1997, 2005 stents   Debility 02/10/2023   Diabetes (HCC)    Diabetes mellitus, type II (HCC)    Displaced fracture of right femoral neck (HCC) 05/01/2023   Dyslipidemia 12/10/2021   Dyslipidemia associated with type 2 diabetes mellitus (HCC) 02/01/2018   Dysphagia, oropharyngeal phase 03/16/2023   Elevated troponin 02/04/2023   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  rigid, progressive flat foot deformity of BOTH feet     GAD (generalized anxiety disorder) 03/16/2023   Gout 1998   Heart attack (HCC)  [id number]   History of CAD (coronary artery disease) 05/01/2023   Hyperglycemia, unspecified 03/16/2023   Hyperlipidemia    Hypertension    Hypertriglyceridemia    Hyponatremia 02/04/2023   Melanotic stools 02/13/2023   Muscle wasting and atrophy, not  elsewhere classified, multiple sites 03/16/2023   Nicotine dependence, cigarettes, uncomplicated 03/16/2023   Non-ST elevation (NSTEMI) myocardial infarction (HCC) 12/14/2021   Nonrheumatic mitral (valve) insufficiency 03/16/2023   Other malaise 03/16/2023   Other nonthrombocytopenic purpura (HCC) 03/16/2023   Pneumonia, unspecified organism 03/16/2023   Preop cardiovascular exam 12/10/2021   Smoking 12/10/2021   Type 2 diabetes mellitus with complication, without long-term current use of insulin (HCC) 02/01/2018   Past Surgical History:  Procedure Laterality Date   APPENDECTOMY  1971   CHOLECYSTECTOMY     LEFT HEART CATH AND CORONARY ANGIOGRAPHY N/A 12/14/2021   Procedure: LEFT HEART CATH AND CORONARY ANGIOGRAPHY;  Surgeon: Yvonne Kendall, MD;  Location: [facility] INVASIVE CV LAB;  Service: Cardiovascular;  Laterality: N/A;   Navel hernia repair  2007   PARTIAL HYSTERECTOMY  1976   Stent Heart     TOTAL HIP ARTHROPLASTY Right 05/03/2023   Procedure: POSTERIOR HEMI HIP;  Surgeon: Teryl Lucy, MD;  Location: [facility] OR;  Service: Orthopedics;  Laterality: Right;   TUBAL LIGATION  1974   Ulna nerve transpost L arm       A IV Location/Drains/Wounds Patient Lines/Drains/Airways Status     Active Line/Drains/Airways     Name Placement date Placement time Site Days   Peripheral IV 05/20/23 18 G Anterior;Left Forearm 05/20/23  1208  Forearm  less than 1   Peripheral IV 05/20/23 20 G Posterior;Right Wrist 05/20/23  1551  Wrist  less than 1   Incision (Closed) 05/20/23 Thigh Posterior;Proximal;Right 05/20/23  1237  -- less than 1            Intake/Output Last 24 hours  Intake/Output Summary (Last 24 hours) at 05/20/2023 2103 Last data filed at 05/20/2023 1612 Gross per 24 hour  Intake 350 ml  Output --  Net 350 ml    Labs/Imaging Results for orders placed or performed during the hospital encounter of 05/20/23 (from the past 48 hour(s))  Urinalysis, w/ Reflex to Culture (Infection  Suspected) -Urine, Clean Catch     Status: Abnormal   Collection Time: 05/20/23 12:11 PM  Result Value Ref Range   Specimen Source URINE, CLEAN CATCH    Color, Urine YELLOW YELLOW   APPearance CLOUDY (A) CLEAR   Specific Gravity, Urine 1.012 1.005 - 1.030   pH 7.0 5.0 - 8.0   Glucose, UA 150 (A) NEGATIVE mg/dL   Hgb urine dipstick MODERATE (A) NEGATIVE   Bilirubin Urine NEGATIVE NEGATIVE   Ketones, ur NEGATIVE NEGATIVE mg/dL   Protein, ur NEGATIVE NEGATIVE mg/dL   Nitrite NEGATIVE NEGATIVE   Leukocytes,Ua LARGE (A) NEGATIVE   RBC / HPF 11-20 0 - 5 RBC/hpf   WBC, UA 21-50 0 - 5 WBC/hpf    Comment:        Reflex urine culture not performed if WBC <=10, OR if Squamous epithelial cells >5. If Squamous epithelial cells >5 suggest recollection.    Bacteria, UA RARE (A) NONE SEEN   Squamous Epithelial / HPF 0-5 0 - 5 /HPF   Mucus PRESENT     Comment: Performed at [facility], [address] [place], [place] [address]  SARS Coronavirus 2 by RT PCR (hospital order, performed in [facility] hospital lab) *cepheid single  result test* Anterior Nasal Swab     Status: None   Collection Time: 05/20/23 12:11 PM   Specimen: Anterior Nasal Swab  Result Value Ref Range   SARS Coronavirus 2 by RT PCR NEGATIVE NEGATIVE    Comment: Performed at [facility], [address] [place], [place] [address]  Comprehensive metabolic panel     Status: Abnormal   Collection Time: 05/20/23 12:30 PM  Result Value Ref Range   Sodium 136 135 - 145 mmol/L   Potassium 4.5 3.5 - 5.1 mmol/L   Chloride 93 (L) 98 - 111 mmol/L   CO2 29 22 - 32 mmol/L   Glucose, Bld 176 (H) 70 - 99 mg/dL    Comment: Glucose reference range applies only to samples taken after fasting for at least 8 hours.   BUN 70 (H) 8 - 23 mg/dL   Creatinine, Ser 9.62 (H) 0.44 - 1.00 mg/dL   Calcium 8.7 (L) 8.9 - 10.3 mg/dL   Total Protein 6.3 (L) 6.5 - 8.1 g/dL   Albumin 2.7 (L) 3.5 - 5.0 g/dL   AST 17 15 - 41 U/L   ALT 12 0 - 44  U/L   Alkaline Phosphatase 62 38 - 126 U/L   Total Bilirubin 0.5 0.3 - 1.2 mg/dL   GFR, Estimated 21 (L) >60 mL/min    Comment: (NOTE) Calculated using the CKD-EPI Creatinine Equation (2021)    Anion gap 14 5 - 15    Comment: Performed at [facility], [address], [place], [place] [address]  CBC with Differential     Status: Abnormal   Collection Time: 05/20/23 12:30 PM  Result Value Ref Range   WBC 9.0 4.0 - 10.5 K/uL   RBC 3.47 (L) 3.87 - 5.11 MIL/uL   Hemoglobin 9.6 (L) 12.0 - 15.0 g/dL   HCT 13.2 (L) 44.0 - 10.2 %   MCV 91.9 80.0 - 100.0 fL   MCH 27.7 26.0 - 34.0 pg   MCHC 30.1 30.0 - 36.0 g/dL   RDW 72.5 (H) 36.6 - 44.0 %   Platelets 335 150 - 400 K/uL   nRBC 0.0 0.0 - 0.2 %   Neutrophils Relative % 81 %   Neutro Abs 7.2 1.7 - 7.7 K/uL   Lymphocytes Relative 9 %   Lymphs Abs 0.8 0.7 - 4.0 K/uL   Monocytes Relative 10 %   Monocytes Absolute 0.9 0.1 - 1.0 K/uL   Eosinophils Relative 0 %   Eosinophils Absolute 0.0 0.0 - 0.5 K/uL   Basophils Relative 0 %   Basophils Absolute 0.0 0.0 - 0.1 K/uL   Immature Granulocytes 0 %   Abs Immature Granulocytes 0.03 0.00 - 0.07 K/uL    Comment: Performed at [facility], [address], [place], [place] [address]  Troponin I (High Sensitivity)     Status: Abnormal   Collection Time: 05/20/23 12:30 PM  Result Value Ref Range   Troponin I (High Sensitivity) 26 (H) <18 ng/L    Comment: (NOTE) Elevated high sensitivity troponin I (hsTnI) values and significant  changes across serial measurements may suggest ACS but many other  chronic and acute conditions are known to elevate hsTnI results.  Refer to the "Links" section for chest pain algorithms and additional  guidance. Performed at [facility], [address] [place], [place] [address]   Brain natriuretic peptide     Status: Abnormal   Collection Time: 05/20/23 12:30 PM  Result Value  Ref Range   B Natriuretic Peptide 2,985.3 (H) 0.0 - 100.0 pg/mL     Comment: Performed at [facility], [address] [place], [place] [address]  I-stat chem 8, ED     Status: Abnormal   Collection Time: 05/20/23 12:38 PM  Result Value Ref Range   Sodium 134 (L) 135 - 145 mmol/L   Potassium 4.5 3.5 - 5.1 mmol/L   Chloride 95 (L) 98 - 111 mmol/L   BUN 63 (H) 8 - 23 mg/dL   Creatinine, Ser 6.04 (H) 0.44 - 1.00 mg/dL   Glucose, Bld 540 (H) 70 - 99 mg/dL    Comment: Glucose reference range applies only to samples taken after fasting for at least 8 hours.   Calcium, Ion 1.07 (L) 1.15 - 1.40 mmol/L   TCO2 29 22 - 32 mmol/L   Hemoglobin 10.5 (L) 12.0 - 15.0 g/dL   HCT 98.1 (L) 19.1 - 47.8 %  Troponin I (High Sensitivity)     Status: Abnormal   Collection Time: 05/20/23  3:05 PM  Result Value Ref Range   Troponin I (High Sensitivity) 24 (H) <18 ng/L    Comment: (NOTE) Elevated high sensitivity troponin I (hsTnI) values and significant  changes across serial measurements may suggest ACS but many other  chronic and acute conditions are known to elevate hsTnI results.  Refer to the "Links" section for chest pain algorithms and additional  guidance. Performed at [facility], [address], [place], [place] [address]    DG Chest Portable 1 View  Result Date: 05/20/2023 CLINICAL DATA:  Dyspnea. EXAM: PORTABLE CHEST 1 VIEW COMPARISON:  05/03/2023. FINDINGS: There are layering bilateral pleural effusions with probable underlying compressive atelectatic changes in the bilateral lower lobes. No pneumothorax. Stable cardio-mediastinal silhouette. No acute osseous abnormalities. The soft tissues are within normal limits. IMPRESSION: 1. Layering bilateral pleural effusions with probable underlying compressive atelectatic changes in the bilateral lower lobes. Electronically Signed   By: Jules Schick M.D.   On: 05/20/2023 14:00   CT Angio Chest PE W and/or Wo Contrast  Result Date: 05/20/2023 CLINICAL DATA:  Pulmonary embolism (PE) suspected, high prob;  Bowel obstruction suspected. Abdominal pain. Shortness of breath. EXAM: CT ANGIOGRAPHY CHEST CT ABDOMEN AND PELVIS WITH CONTRAST TECHNIQUE: Multidetector CT imaging of the chest was performed using the standard protocol during bolus administration of intravenous contrast. Multiplanar CT image reconstructions and MIPs were obtained to evaluate the vascular anatomy. Multidetector CT imaging of the abdomen and pelvis was performed using the standard protocol during bolus administration of intravenous contrast. RADIATION DOSE REDUCTION: This exam was performed according to the departmental dose-optimization program which includes automated exposure control, adjustment of the mA and/or kV according to patient size and/or use of iterative reconstruction technique. CONTRAST:  70mL OMNIPAQUE IOHEXOL 350 MG/ML SOLN COMPARISON:  CT angiography chest from 04/01/2023. FINDINGS: CTA CHEST FINDINGS Cardiovascular: There is small volume nonocclusive filling defects in the bilateral lower lobe distal segmental and proximal subsegmental pulmonary artery levels (marked with electronic arrow sign on series 3). No right heart strain or lung infarction. There is at moderate cardiomegaly. No pericardial effusion. No aortic aneurysm. There are coronary artery calcifications, in keeping with coronary artery disease. There are also moderate peripheral atherosclerotic vascular calcifications of thoracic aorta and its major branches. Mediastinum/Nodes: Visualized thyroid gland appears grossly unremarkable. No solid / cystic mediastinal masses. The esophagus is nondistended precluding optimal assessment. There are few mildly enlarged mediastinal and hilar lymph nodes,  which are indeterminate but appear grossly similar to the prior study. No axillary lymphadenopathy by size criteria. Lungs/Pleura: The central tracheo-bronchial tree is patent. There is mild, smooth, circumferential thickening of the segmental and subsegmental bronchial walls,  throughout bilateral lungs, which is nonspecific. Findings are most commonly seen with bronchitis or reactive airway disease, such as asthma. There is trace left and small right pleural effusion with associated compressive atelectatic changes in the bilateral lower lobes. There are additional patchy opacities in the left lung lower lobe and apicoposterior segment of left upper lobe, favored to represent multilobar pneumonia. There is mild peripheral/subpleural reticulations throughout bilateral lungs, nonspecific. No associated bronchiectasis or honeycombing. There are mild upper lobe predominant centrilobular emphysematous changes. There are patchy areas of smooth interlobular septal thickening throughout bilateral lungs, which can be seen with congestive heart failure/pulmonary edema. Musculoskeletal: The visualized soft tissues of the chest wall are grossly unremarkable. No suspicious osseous lesions. There are mild multilevel degenerative changes in the visualized spine. Review of the MIP images confirms the above findings. CT ABDOMEN and PELVIS FINDINGS Hepatobiliary: The liver is normal in size. Non-cirrhotic configuration. No suspicious mass. These is mild diffuse hepatic steatosis. No intrahepatic bile duct dilation. There is mild prominence of the extrahepatic bile duct, most likely due to post cholecystectomy status. Gallbladder is surgically absent. Pancreas: Unremarkable. No pancreatic ductal dilatation or surrounding inflammatory changes. Spleen: Normal in size. There are subcentimeter calcified granulomas in the spleen. No other focal lesion. Adrenals/Urinary Tract: Adrenal glands are unremarkable. No suspicious renal mass. There are multiple bilateral simple renal cysts with largest in the left kidney lower pole measuring up to 2.0 x 2.1 cm. No hydronephrosis. No renal or ureteric calculi. Unremarkable urinary bladder. Stomach/Bowel: There are 2 smaller adjacent diverticula arising from the distal  duodenum. No disproportionate dilation of the small or large bowel loops. No evidence of abnormal bowel wall thickening or inflammatory changes. The appendix was not visualized; however there is no acute inflammatory process in the right lower quadrant. There are multiple diverticula mainly in the left hemi colon, without imaging signs of diverticulitis. At least moderate stool burden noted. Vascular/Lymphatic: No ascites or pneumoperitoneum. No abdominal or pelvic lymphadenopathy, by size criteria. No aneurysmal dilation of the major abdominal arteries. There are marked peripheral atherosclerotic vascular calcifications of the aorta and its major branches. Reproductive: The uterus is surgically absent. No large adnexal mass. Other: There is a small fat containing umbilical hernia. Atrophy and fatty replacement of pelvic musculature noted. Musculoskeletal: No suspicious osseous lesions. There are moderate multilevel degenerative changes in the visualized spine. Right hip arthroplasty noted. Review of the MIP images confirms the above findings. IMPRESSION: 1. Small volume nonocclusive pulmonary embolism in the bilateral lower lobe distal segmental and proximal subsegmental pulmonary artery levels. No right heart strain or lung infarction. 2. Trace left and small right pleural effusions with associated compressive atelectatic changes in the bilateral lower lobes. Patchy areas of smooth interlobular septal thickening throughout bilateral lungs, which can be seen with congestive heart failure/pulmonary edema. 3. Patchy opacities in the left lung lower lobe and apicoposterior segment of left upper lobe, favored to represent multilobar pneumonia. 4. Multiple other nonacute observations, as described above. Aortic Atherosclerosis (ICD10-I70.0) and Emphysema (ICD10-J43.9). Critical Value/emergent results were called by telephone at the time of interpretation on 05/20/2023 at 1:56 pm to provider Pricilla Loveless , who verbally  acknowledged these results. Electronically Signed   By: Jules Schick M.D.   On: 05/20/2023 13:59   CT  ABDOMEN PELVIS W CONTRAST  Result Date: 05/20/2023 CLINICAL DATA:  Pulmonary embolism (PE) suspected, high prob; Bowel obstruction suspected. Abdominal pain. Shortness of breath. EXAM: CT ANGIOGRAPHY CHEST CT ABDOMEN AND PELVIS WITH CONTRAST TECHNIQUE: Multidetector CT imaging of the chest was performed using the standard protocol during bolus administration of intravenous contrast. Multiplanar CT image reconstructions and MIPs were obtained to evaluate the vascular anatomy. Multidetector CT imaging of the abdomen and pelvis was performed using the standard protocol during bolus administration of intravenous contrast. RADIATION DOSE REDUCTION: This exam was performed according to the departmental dose-optimization program which includes automated exposure control, adjustment of the mA and/or kV according to patient size and/or use of iterative reconstruction technique. CONTRAST:  70mL OMNIPAQUE IOHEXOL 350 MG/ML SOLN COMPARISON:  CT angiography chest from 04/01/2023. FINDINGS: CTA CHEST FINDINGS Cardiovascular: There is small volume nonocclusive filling defects in the bilateral lower lobe distal segmental and proximal subsegmental pulmonary artery levels (marked with electronic arrow sign on series 3). No right heart strain or lung infarction. There is at moderate cardiomegaly. No pericardial effusion. No aortic aneurysm. There are coronary artery calcifications, in keeping with coronary artery disease. There are also moderate peripheral atherosclerotic vascular calcifications of thoracic aorta and its major branches. Mediastinum/Nodes: Visualized thyroid gland appears grossly unremarkable. No solid / cystic mediastinal masses. The esophagus is nondistended precluding optimal assessment. There are few mildly enlarged mediastinal and hilar lymph nodes, which are indeterminate but appear grossly similar to the  prior study. No axillary lymphadenopathy by size criteria. Lungs/Pleura: The central tracheo-bronchial tree is patent. There is mild, smooth, circumferential thickening of the segmental and subsegmental bronchial walls, throughout bilateral lungs, which is nonspecific. Findings are most commonly seen with bronchitis or reactive airway disease, such as asthma. There is trace left and small right pleural effusion with associated compressive atelectatic changes in the bilateral lower lobes. There are additional patchy opacities in the left lung lower lobe and apicoposterior segment of left upper lobe, favored to represent multilobar pneumonia. There is mild peripheral/subpleural reticulations throughout bilateral lungs, nonspecific. No associated bronchiectasis or honeycombing. There are mild upper lobe predominant centrilobular emphysematous changes. There are patchy areas of smooth interlobular septal thickening throughout bilateral lungs, which can be seen with congestive heart failure/pulmonary edema. Musculoskeletal: The visualized soft tissues of the chest wall are grossly unremarkable. No suspicious osseous lesions. There are mild multilevel degenerative changes in the visualized spine. Review of the MIP images confirms the above findings. CT ABDOMEN and PELVIS FINDINGS Hepatobiliary: The liver is normal in size. Non-cirrhotic configuration. No suspicious mass. These is mild diffuse hepatic steatosis. No intrahepatic bile duct dilation. There is mild prominence of the extrahepatic bile duct, most likely due to post cholecystectomy status. Gallbladder is surgically absent. Pancreas: Unremarkable. No pancreatic ductal dilatation or surrounding inflammatory changes. Spleen: Normal in size. There are subcentimeter calcified granulomas in the spleen. No other focal lesion. Adrenals/Urinary Tract: Adrenal glands are unremarkable. No suspicious renal mass. There are multiple bilateral simple renal cysts with largest in  the left kidney lower pole measuring up to 2.0 x 2.1 cm. No hydronephrosis. No renal or ureteric calculi. Unremarkable urinary bladder. Stomach/Bowel: There are 2 smaller adjacent diverticula arising from the distal duodenum. No disproportionate dilation of the small or large bowel loops. No evidence of abnormal bowel wall thickening or inflammatory changes. The appendix was not visualized; however there is no acute inflammatory process in the right lower quadrant. There are multiple diverticula mainly in the left hemi colon, without imaging  signs of diverticulitis. At least moderate stool burden noted. Vascular/Lymphatic: No ascites or pneumoperitoneum. No abdominal or pelvic lymphadenopathy, by size criteria. No aneurysmal dilation of the major abdominal arteries. There are marked peripheral atherosclerotic vascular calcifications of the aorta and its major branches. Reproductive: The uterus is surgically absent. No large adnexal mass. Other: There is a small fat containing umbilical hernia. Atrophy and fatty replacement of pelvic musculature noted. Musculoskeletal: No suspicious osseous lesions. There are moderate multilevel degenerative changes in the visualized spine. Right hip arthroplasty noted. Review of the MIP images confirms the above findings. IMPRESSION: 1. Small volume nonocclusive pulmonary embolism in the bilateral lower lobe distal segmental and proximal subsegmental pulmonary artery levels. No right heart strain or lung infarction. 2. Trace left and small right pleural effusions with associated compressive atelectatic changes in the bilateral lower lobes. Patchy areas of smooth interlobular septal thickening throughout bilateral lungs, which can be seen with congestive heart failure/pulmonary edema. 3. Patchy opacities in the left lung lower lobe and apicoposterior segment of left upper lobe, favored to represent multilobar pneumonia. 4. Multiple other nonacute observations, as described above.  Aortic Atherosclerosis (ICD10-I70.0) and Emphysema (ICD10-J43.9). Critical Value/emergent results were called by telephone at the time of interpretation on 05/20/2023 at 1:56 pm to provider Pricilla Loveless , who verbally acknowledged these results. Electronically Signed   By: Jules Schick M.D.   On: 05/20/2023 13:59    Pending Labs Unresulted Labs (From admission, onward)     Start     Ordered   05/21/23 0500  Basic metabolic panel  Tomorrow morning,   R        05/20/23 1640   05/21/23 0500  CBC  Tomorrow morning,   R        05/20/23 1640   05/20/23 2300  Heparin level (unfractionated)  Once-Timed,   URGENT        05/20/23 1453   05/20/23 2300  APTT  Once,   R        05/20/23 1649   05/20/23 1211  Urine Culture  Once,   R        05/20/23 1211            Vitals/Pain Today's Vitals   05/20/23 2030 05/20/23 2045 05/20/23 2057 05/20/23 2100  BP: 108/62 (!) 104/59  (!) 103/50  Pulse: 71 69  66  Resp: 20 16  (!) 22  Temp:    97.9 F (36.6 C)  TempSrc:    Oral  SpO2: 99% 98%  97%  Weight:      Height:      PainSc:   9      Isolation Precautions No active isolations  Medications Medications  heparin ADULT infusion 100 units/mL (25000 units/261mL) (1,200 Units/hr Intravenous New Bag/Given 05/20/23 1558)  cefTRIAXone (ROCEPHIN) 1 g in sodium chloride 0.9 % 100 mL IVPB (has no administration in time range)  aspirin EC tablet 81 mg (has no administration in time range)  HYDROcodone-acetaminophen (NORCO/VICODIN) 5-325 MG per tablet 1-2 tablet (1 tablet Oral Given 05/20/23 2056)  atorvastatin (LIPITOR) tablet 80 mg (80 mg Oral Given 05/20/23 1937)  carvedilol (COREG) tablet 6.25 mg (6.25 mg Oral Given 05/20/23 1937)  nitroGLYCERIN (NITROSTAT) SL tablet 0.4 mg (has no administration in time range)  busPIRone (BUSPAR) tablet 10 mg (has no administration in time range)  pantoprazole (PROTONIX) EC tablet 40 mg (has no administration in time range)  polyethylene glycol (MIRALAX /  GLYCOLAX) packet 34 g (has no administration in  time range)  ferrous sulfate tablet 325 mg (has no administration in time range)  melatonin tablet 5 mg (has no administration in time range)  cholecalciferol (VITAMIN D3) 25 MCG (1000 UNIT) tablet 10,000 Units (has no administration in time range)  multivitamin with minerals tablet (has no administration in time range)  albuterol (PROVENTIL) (2.5 MG/3ML) 0.083% nebulizer solution 3 mL (has no administration in time range)  umeclidinium-vilanterol (ANORO ELLIPTA) 62.5-25 MCG/ACT 1 puff (has no administration in time range)  sodium chloride flush (NS) 0.9 % injection 3 mL (has no administration in time range)  sodium chloride flush (NS) 0.9 % injection 3 mL (has no administration in time range)  0.9 %  sodium chloride infusion (has no administration in time range)  acetaminophen (TYLENOL) tablet 650 mg (650 mg Oral Given 05/20/23 1936)    Or  acetaminophen (TYLENOL) suppository 650 mg ( Rectal See Alternative 05/20/23 1936)  ondansetron (ZOFRAN) tablet 4 mg (has no administration in time range)    Or  ondansetron (ZOFRAN) injection 4 mg (has no administration in time range)  insulin aspart (novoLOG) injection 0-15 Units (has no administration in time range)  mineral oil enema 1 enema (1 enema Rectal Given 05/20/23 1326)  iohexol (OMNIPAQUE) 350 MG/ML injection 70 mL (70 mLs Intravenous Contrast Given 05/20/23 1311)  cefTRIAXone (ROCEPHIN) 1 g in sodium chloride 0.9 % 100 mL IVPB (0 g Intravenous Stopped 05/20/23 1550)  azithromycin (ZITHROMAX) 500 mg in sodium chloride 0.9 % 250 mL IVPB (0 mg Intravenous Stopped 05/20/23 1612)  heparin bolus via infusion 4,000 Units (4,000 Units Intravenous Bolus from Bag 05/20/23 1600)  furosemide (LASIX) injection 40 mg (40 mg Intravenous Given 05/20/23 2056)    Mobility walks with device     Focused Assessments Cardiac Assessment Handoff:    No results found for: "CKTOTAL", "CKMB", "CKMBINDEX",  "TROPONINI" No results found for: "DDIMER" Does the Patient currently have chest pain? No    R Recommendations: See Admitting Provider Note  Report given to:   Additional Notes: on Heparin drip, co all over body pain.

## 2023-05-20 NOTE — Assessment & Plan Note (Signed)
Secondary to COPD Continue oxygen supplementation at 2 to 3 L to maintain pulse oximetry greater than 92%

## 2023-05-20 NOTE — Assessment & Plan Note (Signed)
Symptomatic Patient has dysuria and frequency of urination.  She also has pyuria. Urine culture positive for Pseudomonas.   Will change from ceftriaxone to fosfomycin.

## 2023-05-20 NOTE — Progress Notes (Signed)
ANTICOAGULATION CONSULT NOTE - Initial Consult  Pharmacy Consult for heparin Indication: pulmonary embolus  No Known Allergies  Patient Measurements: Height: 5\' 6"  (167.6 cm) Weight: 74.8 kg (165 lb) IBW/kg (Calculated) : 59.3 Heparin Dosing Weight: 74 kg   Vital Signs: Temp: 97.5 F (36.4 C) (09/20 1213) Temp Source: Oral (09/20 1213) BP: 107/63 (09/20 1245) Pulse Rate: 71 (09/20 1245)  Labs: Recent Labs    05/20/23 1230 05/20/23 1238  HGB 9.6* 10.5*  HCT 31.9* 31.0*  PLT 335  --   CREATININE 2.29* 2.10*  TROPONINIHS 26*  --     Estimated Creatinine Clearance: 22.1 mL/min (A) (by C-G formula based on SCr of 2.1 mg/dL (H)).   Medical History: Past Medical History:  Diagnosis Date   Acute exacerbation of CHF (congestive heart failure) (HCC) 12/10/2021   Acute hypoxic respiratory failure (HCC) 02/04/2023   Acute on chronic HFrEF (heart failure with reduced ejection fraction) (HCC) 02/05/2023   Acute on chronic hypoxic respiratory failure (HCC) 02/05/2023   Acute systolic heart failure (HCC) 12/14/2021   AKI (acute kidney injury) (HCC) 02/06/2023   Anemia 02/13/2023   Anxiety disorder, unspecified 03/16/2023   Arthritis 2010   Lower back and neck   Arthritis of both feet 02/01/2018   CAD (coronary artery disease)    Cardiomyopathy (HCC) 01/01/2022   Chronic edema 12/18/2021   Bilateral lower extremities         Chronic hypoxic respiratory failure (HCC) 05/01/2023   Chronic kidney disease (CKD), stage III (moderate) (HCC) 05/01/2023   Closed right hip fracture, initial encounter (HCC) 05/01/2023   Cognitive communication deficit 03/16/2023   Congestive heart failure (CHF) (HCC) 12/10/2021   COPD (chronic obstructive pulmonary disease) (HCC)    COPD exacerbation (HCC) 02/05/2023   Coping style affecting medical condition 02/23/2023   Coronary artery disease PTCA and stenting in 1997 and 2005 in [place] 12/10/2021   CVA (cerebral vascular accident) (HCC)     1997, 2005 stents   Debility 02/10/2023   Diabetes (HCC)    Diabetes mellitus, type II (HCC)    Displaced fracture of right femoral neck (HCC) 05/01/2023   Dyslipidemia 12/10/2021   Dyslipidemia associated with type 2 diabetes mellitus (HCC) 02/01/2018   Dysphagia, oropharyngeal phase 03/16/2023   Elevated troponin 02/04/2023   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  rigid, progressive flat foot deformity of BOTH feet     GAD (generalized anxiety disorder) 03/16/2023   Gout 1998   Heart attack (HCC)    [id number]   History of CAD (coronary artery disease) 05/01/2023   Hyperglycemia, unspecified 03/16/2023   Hyperlipidemia    Hypertension    Hypertriglyceridemia    Hyponatremia 02/04/2023   Melanotic stools 02/13/2023   Muscle wasting and atrophy, not elsewhere classified, multiple sites 03/16/2023   Nicotine dependence, cigarettes, uncomplicated 03/16/2023   Non-ST elevation (NSTEMI) myocardial infarction (HCC) 12/14/2021   Nonrheumatic mitral (valve) insufficiency 03/16/2023   Other malaise 03/16/2023   Other nonthrombocytopenic purpura (HCC) 03/16/2023   Pneumonia, unspecified organism 03/16/2023   Preop cardiovascular exam 12/10/2021   Smoking 12/10/2021   Type 2 diabetes mellitus with complication, without long-term current use of insulin (HCC) 02/01/2018     Assessment: 81yoF presenting to the ED with chest tightness and shortness of breath found to have PE. No oral anticoagulation reported prior to admission. Hgb 10.5 which is near baseline. PLT 335. Noted crcl ~22 ml/min. Pharmacy consulted to start heparin infusion. Patient  previously therapeutic on 1600 units/hr last year 11/2021   Goal of Therapy:  Heparin level 0.3-0.7 units/ml Monitor platelets by anticoagulation protocol: Yes   Plan:  Give 4000 units bolus x 1 Start heparin infusion at 1200 units/hr Check anti-Xa level in 8 hours and daily while on  heparin Continue to monitor H&H and platelets  Ruben Im, PharmD Clinical Pharmacist 05/20/2023 2:49 PM Please check AMION for all [facility] numbers

## 2023-05-20 NOTE — Consult Note (Signed)
Cardiology Consultation   Patient ID: Angela Lucero MRN: [id number]; DOB: 1941/01/11  Admit date: 05/20/2023 Date of Consult: 05/20/2023  PCP:  [name], MD   [facility] Providers Cardiologist:  Gypsy Balsam, MD   {    Patient Profile:   Angela Lucero is a 82 y.o. female with a hx of  CAD s/p PTCA, COPD on home oxygen, HFrEF, history of CVA, type 2 DM, gout, HLD, HTN, CKD stage IIIb, severe MR,  admit earlier this month with hip fracture s/p surgery who is being seen 05/20/2023 for the evaluation of SOB at the request of Dr Joylene Igo.  History of Present Illness:   Angela Lucero s a 82 y.o. female with a hx of CAD s/p PTCA, COPD on home oxygen, HFrEF, history of CVA, type 2 DM, gout, HLD, HTN, CKD stage IIIb, severe MR,  admit earlier this month with hip fracture s/p surgery, presents with SOB and chest pain, abdominal pain. Reports progressing DOE over the last few days, today progressed to the point of severe SOB. Developed some chest tightness that was progressive as well over the last few days.    K 4.5 Cr 2.29 WBC 9 Hgb 9.6 Plt 335 BNP 2985 (prior 2534) Trop 26-->24 CXR: bilateral effusions CT: small bilateral PEs, no right heart strain, possibly CHF, possible pneumonia EKG SR, RBBB/LAFB  03/2023 echo: LVEF 20-25%, indet diastolic fxn, severe MR, PASP 50 05/2023 echo limited LVEF 20-25%, normal RV function Past Medical History:  Diagnosis Date   Acute exacerbation of CHF (congestive heart failure) (HCC) 12/10/2021   Acute hypoxic respiratory failure (HCC) 02/04/2023   Acute on chronic HFrEF (heart failure with reduced ejection fraction) (HCC) 02/05/2023   Acute on chronic hypoxic respiratory failure (HCC) 02/05/2023   Acute systolic heart failure (HCC) 12/14/2021   AKI (acute kidney injury) (HCC) 02/06/2023   Anemia 02/13/2023   Anxiety disorder, unspecified 03/16/2023   Arthritis 2010   Lower back and neck   Arthritis of both feet 02/01/2018   CAD (coronary artery  disease)    Cardiomyopathy (HCC) 01/01/2022   Chronic edema 12/18/2021   Bilateral lower extremities         Chronic hypoxic respiratory failure (HCC) 05/01/2023   Chronic kidney disease (CKD), stage III (moderate) (HCC) 05/01/2023   Closed right hip fracture, initial encounter (HCC) 05/01/2023   Cognitive communication deficit 03/16/2023   Congestive heart failure (CHF) (HCC) 12/10/2021   COPD (chronic obstructive pulmonary disease) (HCC)    COPD exacerbation (HCC) 02/05/2023   Coping style affecting medical condition 02/23/2023   Coronary artery disease PTCA and stenting in 1997 and 2005 in [place] 12/10/2021   CVA (cerebral vascular accident) (HCC)    1997, 2005 stents   Debility 02/10/2023   Diabetes (HCC)    Diabetes mellitus, type II (HCC)    Displaced fracture of right femoral neck (HCC) 05/01/2023   Dyslipidemia 12/10/2021   Dyslipidemia associated with type 2 diabetes mellitus (HCC) 02/01/2018   Dysphagia, oropharyngeal phase 03/16/2023   Elevated troponin 02/04/2023   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  rigid, progressive flat foot deformity of BOTH feet     GAD (generalized anxiety disorder) 03/16/2023   Gout 1998   Heart attack (HCC)    8413,[id number]   History of CAD (coronary artery disease) 05/01/2023   Hyperglycemia, unspecified 03/16/2023   Hyperlipidemia    Hypertension    Hypertriglyceridemia    Hyponatremia 02/04/2023  Melanotic stools 02/13/2023   Muscle wasting and atrophy, not elsewhere classified, multiple sites 03/16/2023   Nicotine dependence, cigarettes, uncomplicated 03/16/2023   Non-ST elevation (NSTEMI) myocardial infarction (HCC) 12/14/2021   Nonrheumatic mitral (valve) insufficiency 03/16/2023   Other malaise 03/16/2023   Other nonthrombocytopenic purpura (HCC) 03/16/2023   Pneumonia, unspecified organism 03/16/2023   Preop cardiovascular exam 12/10/2021   Smoking 12/10/2021   Type 2 diabetes  mellitus with complication, without long-term current use of insulin (HCC) 02/01/2018    Past Surgical History:  Procedure Laterality Date   APPENDECTOMY  1971   CHOLECYSTECTOMY     LEFT HEART CATH AND CORONARY ANGIOGRAPHY N/A 12/14/2021   Procedure: LEFT HEART CATH AND CORONARY ANGIOGRAPHY;  Surgeon: Yvonne Kendall, MD;  Location: [facility] IN[facility]ASIVE [facility];  Service: Cardiovascular;  Laterality: N/A;   Navel hernia repair  2007   PARTIAL HYSTERECTOMY  1976   Stent Heart     TOTAL HIP ARTHROPLASTY Right 05/03/2023   Procedure: POSTERIOR HEMI HIP;  Surgeon: Teryl Lucy, MD;  Location: [facility] OR;  Service: Orthopedics;  Laterality: Right;   TUBAL LIGATION  1974   Ulna nerve transpost L arm        Inpatient Medications: Scheduled Meds:  [START ON 05/21/2023] aspirin EC  81 mg Oral Daily   atorvastatin  80 mg Oral Daily   busPIRone  10 mg Oral TID   carvedilol  6.25 mg Oral BID WC   [START ON 05/21/2023] cholecalciferol  10,000 Units Oral Q0600   [START ON 05/21/2023] ferrous sulfate  325 mg Oral TID PC   [START ON 05/21/2023] insulin aspart  0-15 Units Subcutaneous TID WC   melatonin  5 mg Oral QHS   [START ON 05/21/2023] multivitamin with minerals   Oral Daily   [START ON 05/21/2023] pantoprazole  40 mg Oral Daily   [START ON 05/21/2023] polyethylene glycol  34 g Oral Daily   sodium chloride flush  3 mL Intravenous Q12H   umeclidinium-vilanterol  1 puff Inhalation Daily   Continuous Infusions:  sodium chloride     [START ON 05/21/2023] cefTRIAXone (ROCEPHIN)  IV     heparin 1,200 Units/hr (05/20/23 1558)   PRN Meds: sodium chloride, acetaminophen **OR** acetaminophen, albuterol, HYDROcodone-acetaminophen, nitroGLYCERIN, ondansetron **OR** ondansetron (ZOFRAN) IV, sodium chloride flush  Allergies:   No Known Allergies  Social History:   Social History   Socioeconomic History   Marital status: Widowed    Spouse name: Not on file   Number of children: Not on file   Years of education:  Not on file   Highest education level: Not on file  Occupational History   Not on file  Tobacco Use   Smoking status: Former    Current packs/day: 0.00    Types: Cigarettes    Quit date: 01/2023    Years since quitting: 0.3   Smokeless tobacco: Never  Vaping Use   Vaping status: Never Used  Substance and Sexual Activity   Alcohol use: Not Currently   Drug use: Never   Sexual activity: Not Currently  Other Topics Concern   Not on file  Social History Narrative   Not on file   Social Determinants of Health   Financial Resource Strain: Low Risk  (02/10/2023)   Overall Financial Resource Strain (CARDIA)    Difficulty of Paying Living Expenses: Not very hard  Food Insecurity: No Food Insecurity (05/20/2023)   Hunger Vital Sign    Worried About Running Out of Food in the Last Year: Never  true    Ran Out of Food in the Last Year: Never true  Transportation Needs: No Transportation Needs (05/20/2023)   PRAPARE - Administrator, Civil Service (Medical): No    Lack of Transportation (Non-Medical): No  Physical Activity: Not on file  Stress: Not on file  Social Connections: Not on file  Intimate Partner Violence: Not At Risk (05/20/2023)   Humiliation, Afraid, Rape, and Kick questionnaire    Fear of Current or Ex-Partner: No    Emotionally Abused: No    Physically Abused: No    Sexually Abused: No    Family History:    Family History  Problem Relation Age of Onset   Heart disease Mother    Hypertension Mother    Heart disease Father    Hypertension Father    Cancer Brother      ROS:  Please see the history of present illness.   All other ROS reviewed and negative.     Physical Exam/Data:   Vitals:   05/20/23 1214 05/20/23 1215 05/20/23 1230 05/20/23 1245  BP:  111/61 (!) 104/59 107/63  Pulse:  70 72 71  Resp:  15 (!) 27 (!) 22  Temp:      TempSrc:      SpO2:  99% 99% 96%  Weight: 74.8 kg     Height: 5\' 6"  (1.676 m)       Intake/Output Summary  (Last 24 hours) at 05/20/2023 1928 Last data filed at 05/20/2023 1612 Gross per 24 hour  Intake 350 ml  Output --  Net 350 ml      05/20/2023   12:14 PM 05/09/2023    4:05 AM 05/08/2023    7:26 AM  Last 3 Weights  Weight (lbs) 165 lb 179 lb 7.3 oz 177 lb 11.1 oz  Weight (kg) 74.844 kg 81.4 kg 80.6 kg     Body mass index is 26.63 kg/m.  General:  Well nourished, well developed, in no acute distress HEENT: normal Neck: no JVD Vascular: No carotid bruits; Distal pulses 2+ bilaterally Cardiac:  RRR, 2/6 systolic murmur apex Lungs:  crackles bilaterally Abd: soft, nontender, no hepatomegaly  Ext: no edema Musculoskeletal:  No deformities, BUE and BLE strength normal and equal Skin: warm and dry  Neuro:  CNs 2-12 intact, no focal abnormalities noted Psych:  Normal affect     Laboratory Data:  High Sensitivity Troponin:   Recent Labs  Lab 05/20/23 1230 05/20/23 1505  TROPONINIHS 26* 24*     Chemistry Recent Labs  Lab 05/20/23 1230 05/20/23 1238  NA 136 134*  K 4.5 4.5  CL 93* 95*  CO2 29  --   GLUCOSE 176* 180*  BUN 70* 63*  CREATININE 2.29* 2.10*  CALCIUM 8.7*  --   GFRNONAA 21*  --   ANIONGAP 14  --     Recent Labs  Lab 05/20/23 1230  PROT 6.3*  ALBUMIN 2.7*  AST 17  ALT 12  ALKPHOS 62  BILITOT 0.5   Lipids No results for input(s): "CHOL", "TRIG", "HDL", "LABVLDL", "LDLCALC", "CHOLHDL" in the last 168 hours.  Hematology Recent Labs  Lab 05/20/23 1230 05/20/23 1238  WBC 9.0  --   RBC 3.47*  --   HGB 9.6* 10.5*  HCT 31.9* 31.0*  MCV 91.9  --   MCH 27.7  --   MCHC 30.1  --   RDW 19.5*  --   PLT 335  --    Thyroid No results for  input(s): "TSH", "FREET4" in the last 168 hours.  BNP Recent Labs  Lab 05/20/23 1230  BNP 2,985.3*    DDimer No results for input(s): "DDIMER" in the last 168 hours.   Radiology/Studies:  DG Chest Portable 1 View  Result Date: 05/20/2023 CLINICAL DATA:  Dyspnea. EXAM: PORTABLE CHEST 1 VIEW COMPARISON:   05/03/2023. FINDINGS: There are layering bilateral pleural effusions with probable underlying compressive atelectatic changes in the bilateral lower lobes. No pneumothorax. Stable cardio-mediastinal silhouette. No acute osseous abnormalities. The soft tissues are within normal limits. IMPRESSION: 1. Layering bilateral pleural effusions with probable underlying compressive atelectatic changes in the bilateral lower lobes. Electronically Signed   By: Jules Schick M.D.   On: 05/20/2023 14:00   CT Angio Chest PE W and/or Wo Contrast  Result Date: 05/20/2023 CLINICAL DATA:  Pulmonary embolism (PE) suspected, high prob; Bowel obstruction suspected. Abdominal pain. Shortness of breath. EXAM: CT ANGIOGRAPHY CHEST CT ABDOMEN AND PELVIS WITH CONTRAST TECHNIQUE: Multidetector CT imaging of the chest was performed using the standard protocol during bolus administration of intravenous contrast. Multiplanar CT image reconstructions and MIPs were obtained to evaluate the vascular anatomy. Multidetector CT imaging of the abdomen and pelvis was performed using the standard protocol during bolus administration of intravenous contrast. RADIATION DOSE REDUCTION: This exam was performed according to the departmental dose-optimization program which includes automated exposure control, adjustment of the mA and/or kV according to patient size and/or use of iterative reconstruction technique. CONTRAST:  70mL OMNIPAQUE IOHEXOL 350 MG/ML SOLN COMPARISON:  CT angiography chest from 04/01/2023. FINDINGS: CTA CHEST FINDINGS Cardiovascular: There is small volume nonocclusive filling defects in the bilateral lower lobe distal segmental and proximal subsegmental pulmonary artery levels (marked with electronic arrow sign on series 3). No right heart strain or lung infarction. There is at moderate cardiomegaly. No pericardial effusion. No aortic aneurysm. There are coronary artery calcifications, in keeping with coronary artery disease. There  are also moderate peripheral atherosclerotic vascular calcifications of thoracic aorta and its major branches. Mediastinum/Nodes: Visualized thyroid gland appears grossly unremarkable. No solid / cystic mediastinal masses. The esophagus is nondistended precluding optimal assessment. There are few mildly enlarged mediastinal and hilar lymph nodes, which are indeterminate but appear grossly similar to the prior study. No axillary lymphadenopathy by size criteria. Lungs/Pleura: The central tracheo-bronchial tree is patent. There is mild, smooth, circumferential thickening of the segmental and subsegmental bronchial walls, throughout bilateral lungs, which is nonspecific. Findings are most commonly seen with bronchitis or reactive airway disease, such as asthma. There is trace left and small right pleural effusion with associated compressive atelectatic changes in the bilateral lower lobes. There are additional patchy opacities in the left lung lower lobe and apicoposterior segment of left upper lobe, favored to represent multilobar pneumonia. There is mild peripheral/subpleural reticulations throughout bilateral lungs, nonspecific. No associated bronchiectasis or honeycombing. There are mild upper lobe predominant centrilobular emphysematous changes. There are patchy areas of smooth interlobular septal thickening throughout bilateral lungs, which can be seen with congestive heart failure/pulmonary edema. Musculoskeletal: The visualized soft tissues of the chest wall are grossly unremarkable. No suspicious osseous lesions. There are mild multilevel degenerative changes in the visualized spine. Review of the MIP images confirms the above findings. CT ABDOMEN and PELVIS FINDINGS Hepatobiliary: The liver is normal in size. Non-cirrhotic configuration. No suspicious mass. These is mild diffuse hepatic steatosis. No intrahepatic bile duct dilation. There is mild prominence of the extrahepatic bile duct, most likely due to  post cholecystectomy status. Gallbladder is surgically  absent. Pancreas: Unremarkable. No pancreatic ductal dilatation or surrounding inflammatory changes. Spleen: Normal in size. There are subcentimeter calcified granulomas in the spleen. No other focal lesion. Adrenals/Urinary Tract: Adrenal glands are unremarkable. No suspicious renal mass. There are multiple bilateral simple renal cysts with largest in the left kidney lower pole measuring up to 2.0 x 2.1 cm. No hydronephrosis. No renal or ureteric calculi. Unremarkable urinary bladder. Stomach/Bowel: There are 2 smaller adjacent diverticula arising from the distal duodenum. No disproportionate dilation of the small or large bowel loops. No evidence of abnormal bowel wall thickening or inflammatory changes. The appendix was not visualized; however there is no acute inflammatory process in the right lower quadrant. There are multiple diverticula mainly in the left hemi colon, without imaging signs of diverticulitis. At least moderate stool burden noted. Vascular/Lymphatic: No ascites or pneumoperitoneum. No abdominal or pelvic lymphadenopathy, by size criteria. No aneurysmal dilation of the major abdominal arteries. There are marked peripheral atherosclerotic vascular calcifications of the aorta and its major branches. Reproductive: The uterus is surgically absent. No large adnexal mass. Other: There is a small fat containing umbilical hernia. Atrophy and fatty replacement of pelvic musculature noted. Musculoskeletal: No suspicious osseous lesions. There are moderate multilevel degenerative changes in the visualized spine. Right hip arthroplasty noted. Review of the MIP images confirms the above findings. IMPRESSION: 1. Small volume nonocclusive pulmonary embolism in the bilateral lower lobe distal segmental and proximal subsegmental pulmonary artery levels. No right heart strain or lung infarction. 2. Trace left and small right pleural effusions with associated  compressive atelectatic changes in the bilateral lower lobes. Patchy areas of smooth interlobular septal thickening throughout bilateral lungs, which can be seen with congestive heart failure/pulmonary edema. 3. Patchy opacities in the left lung lower lobe and apicoposterior segment of left upper lobe, favored to represent multilobar pneumonia. 4. Multiple other nonacute observations, as described above. Aortic Atherosclerosis (ICD10-I70.0) and Emphysema (ICD10-J43.9). Critical Value/emergent results were called by telephone at the time of interpretation on 05/20/2023 at 1:56 pm to provider Pricilla Loveless , who verbally acknowledged these results. Electronically Signed   By: Jules Schick M.D.   On: 05/20/2023 13:59   CT ABDOMEN PELVIS W CONTRAST  Result Date: 05/20/2023 CLINICAL DATA:  Pulmonary embolism (PE) suspected, high prob; Bowel obstruction suspected. Abdominal pain. Shortness of breath. EXAM: CT ANGIOGRAPHY CHEST CT ABDOMEN AND PELVIS WITH CONTRAST TECHNIQUE: Multidetector CT imaging of the chest was performed using the standard protocol during bolus administration of intravenous contrast. Multiplanar CT image reconstructions and MIPs were obtained to evaluate the vascular anatomy. Multidetector CT imaging of the abdomen and pelvis was performed using the standard protocol during bolus administration of intravenous contrast. RADIATION DOSE REDUCTION: This exam was performed according to the departmental dose-optimization program which includes automated exposure control, adjustment of the mA and/or kV according to patient size and/or use of iterative reconstruction technique. CONTRAST:  70mL OMNIPAQUE IOHEXOL 350 MG/ML SOLN COMPARISON:  CT angiography chest from 04/01/2023. FINDINGS: CTA CHEST FINDINGS Cardiovascular: There is small volume nonocclusive filling defects in the bilateral lower lobe distal segmental and proximal subsegmental pulmonary artery levels (marked with electronic arrow sign on  series 3). No right heart strain or lung infarction. There is at moderate cardiomegaly. No pericardial effusion. No aortic aneurysm. There are coronary artery calcifications, in keeping with coronary artery disease. There are also moderate peripheral atherosclerotic vascular calcifications of thoracic aorta and its major branches. Mediastinum/Nodes: Visualized thyroid gland appears grossly unremarkable. No solid / cystic mediastinal masses.  The esophagus is nondistended precluding optimal assessment. There are few mildly enlarged mediastinal and hilar lymph nodes, which are indeterminate but appear grossly similar to the prior study. No axillary lymphadenopathy by size criteria. Lungs/Pleura: The central tracheo-bronchial tree is patent. There is mild, smooth, circumferential thickening of the segmental and subsegmental bronchial walls, throughout bilateral lungs, which is nonspecific. Findings are most commonly seen with bronchitis or reactive airway disease, such as asthma. There is trace left and small right pleural effusion with associated compressive atelectatic changes in the bilateral lower lobes. There are additional patchy opacities in the left lung lower lobe and apicoposterior segment of left upper lobe, favored to represent multilobar pneumonia. There is mild peripheral/subpleural reticulations throughout bilateral lungs, nonspecific. No associated bronchiectasis or honeycombing. There are mild upper lobe predominant centrilobular emphysematous changes. There are patchy areas of smooth interlobular septal thickening throughout bilateral lungs, which can be seen with congestive heart failure/pulmonary edema. Musculoskeletal: The visualized soft tissues of the chest wall are grossly unremarkable. No suspicious osseous lesions. There are mild multilevel degenerative changes in the visualized spine. Review of the MIP images confirms the above findings. CT ABDOMEN and PELVIS FINDINGS Hepatobiliary: The liver  is normal in size. Non-cirrhotic configuration. No suspicious mass. These is mild diffuse hepatic steatosis. No intrahepatic bile duct dilation. There is mild prominence of the extrahepatic bile duct, most likely due to post cholecystectomy status. Gallbladder is surgically absent. Pancreas: Unremarkable. No pancreatic ductal dilatation or surrounding inflammatory changes. Spleen: Normal in size. There are subcentimeter calcified granulomas in the spleen. No other focal lesion. Adrenals/Urinary Tract: Adrenal glands are unremarkable. No suspicious renal mass. There are multiple bilateral simple renal cysts with largest in the left kidney lower pole measuring up to 2.0 x 2.1 cm. No hydronephrosis. No renal or ureteric calculi. Unremarkable urinary bladder. Stomach/Bowel: There are 2 smaller adjacent diverticula arising from the distal duodenum. No disproportionate dilation of the small or large bowel loops. No evidence of abnormal bowel wall thickening or inflammatory changes. The appendix was not visualized; however there is no acute inflammatory process in the right lower quadrant. There are multiple diverticula mainly in the left hemi colon, without imaging signs of diverticulitis. At least moderate stool burden noted. Vascular/Lymphatic: No ascites or pneumoperitoneum. No abdominal or pelvic lymphadenopathy, by size criteria. No aneurysmal dilation of the major abdominal arteries. There are marked peripheral atherosclerotic vascular calcifications of the aorta and its major branches. Reproductive: The uterus is surgically absent. No large adnexal mass. Other: There is a small fat containing umbilical hernia. Atrophy and fatty replacement of pelvic musculature noted. Musculoskeletal: No suspicious osseous lesions. There are moderate multilevel degenerative changes in the visualized spine. Right hip arthroplasty noted. Review of the MIP images confirms the above findings. IMPRESSION: 1. Small volume nonocclusive  pulmonary embolism in the bilateral lower lobe distal segmental and proximal subsegmental pulmonary artery levels. No right heart strain or lung infarction. 2. Trace left and small right pleural effusions with associated compressive atelectatic changes in the bilateral lower lobes. Patchy areas of smooth interlobular septal thickening throughout bilateral lungs, which can be seen with congestive heart failure/pulmonary edema. 3. Patchy opacities in the left lung lower lobe and apicoposterior segment of left upper lobe, favored to represent multilobar pneumonia. 4. Multiple other nonacute observations, as described above. Aortic Atherosclerosis (ICD10-I70.0) and Emphysema (ICD10-J43.9). Critical Value/emergent results were called by telephone at the time of interpretation on 05/20/2023 at 1:56 pm to provider Pricilla Loveless , who verbally acknowledged these results. Electronically  Signed   By: Jules Schick M.D.   On: 05/20/2023 13:59     Assessment and Plan:   1.Acute on chronic HFrEF 03/2023 echo: LVEF 20-25%, indet diastolic fxn, severe MR, PASP 50 05/2023 echo limited LVEF 20-25%, normal RV function  CT/CXR pulmonary effusions, signs of HF. BNP 2985 with a prior 2 weeks ago 2534. Trace bilalteral edema, does have bilateral crackles.  - stable bp's can continue home coreg, with AKI GFR 21 hold SLGT2i.   - some signs of fluid overload. Unclear if AKI is related to venous congestion and HF or alternative etiology. Gentle dose of IV lasix and follow, will dose 40mg  IV x 1.    2. Pulmonary embolism - bilateral small PEs on imaging in setting of recent admission, orthopedic surgery. Provoked PE. CT without signs of RV strain, limited echo pending.  - started on heparin gtt, transition to DOAC later in admission.  - hemodynamically stable   3. AKI on CKD -admit Cr 2.29, 2 weeks ago baseline 1.3-1.5 - did get additional dye load with CT PE in ER.  - unclear if venous congestion from HF or alternative  etiology for AKI - follow closely with gentle diuresis.   4.Severe MR - in setting of severe systolic dysfunction, severe advanced comorbidities - no additional workup at this time, manage her cardiomyopathy.    5. CAD - history as reported above, no acute issues at this time     For questions or updates, please contact [facility] Please consult [email or web address] for contact info under    Signed, Dina Rich, MD  05/20/2023 7:28 PM

## 2023-05-20 NOTE — Assessment & Plan Note (Signed)
Patient has a baseline serum creatinine of 1.27 and today on admission it is 2.29 with elevated BUN. Monitor renal function closely while on diuretic therapy

## 2023-05-20 NOTE — ED Triage Notes (Signed)
Per EMS and pt report, pt has been having abd pain and distention for 3 days. Pt states they came to her bedside and had a scan. Sent to the ED to R/O bowel obstruction. Pt also reports being constipated for 3 days. Currently being treated for UTI. Pt here from [facility].

## 2023-05-20 NOTE — ED Provider Notes (Signed)
[place] EMERGENCY DEPARTMENT AT [facility] Provider Note   CSN: [id number] Arrival date & time: 05/20/23  1141     History  Chief Complaint  Patient presents with   Abdominal Pain    Angela Lucero is a 82 y.o. female.  HPI 82 year old female presents with constipation, chest tightness and shortness of breath.  She had hip surgery at the beginning of this month.  For the past 3 days she has been having minimal bowel movements that are just like pebbles.  She has been having abdominal distention and lower abdominal discomfort.  She is also been having trouble urinating and feels like she can only get small amounts out but has to urinate often.  She was started on antibiotics for a UTI but she is not sure which 1.  She is currently in a rehabilitation facility.  She has developed shortness of breath and chest tightness this morning.  Does not feel like when she has had prior heart attacks.  She has been getting a lot of Lasix at the facility due to her not urinating much and her leg swelling is gone but she still not urinating often.  Home Medications Prior to Admission medications   Medication Sig Start Date End Date Taking? Authorizing Provider  acetaminophen (TYLENOL) 500 MG tablet Take 500 mg by mouth every 6 (six) hours as needed for mild pain.    [provider]  albuterol (VENTOLIN HFA) 108 (90 Base) MCG/ACT inhaler Inhale 2 puffs into the lungs every 6 (six) hours as needed for wheezing or shortness of breath. 04/26/23   [name], MD  aspirin EC 81 MG tablet Take 81 mg by mouth daily. Swallow whole.    [provider]  atorvastatin (LIPITOR) 80 MG tablet TAKE 1 TABLET BY MOUTH EVERY DAY 05/19/23   [name], MD  busPIRone (BUSPAR) 10 MG tablet TAKE 1 TABLET BY MOUTH THREE TIMES A DAY 05/19/23   [name], MD  carvedilol (COREG) 6.25 MG tablet Take 1 tablet (6.25 mg total) by mouth 2 (two) times daily with a meal. 05/09/23 06/08/23  Dorcas Carrow, MD   Cetirizine-Pseudoephedrine (ALLERGY RELIEF D PO) Take 1 tablet by mouth daily.    [provider]  Cholecalciferol (VITAMIN D3) 250 MCG (10000 UT) TABS Take 10,000 Units by mouth daily at 6 (six) AM. 05/09/23   Dorcas Carrow, MD  clonazePAM (KLONOPIN) 0.5 MG tablet Take 1 tablet (0.5 mg total) by mouth 2 (two) times daily as needed for up to 5 days for anxiety. 05/09/23 05/14/23  Dorcas Carrow, MD  docusate sodium (COLACE) 100 MG capsule Take 1 capsule (100 mg total) by mouth 2 (two) times daily. 05/09/23   Dorcas Carrow, MD  empagliflozin (JARDIANCE) 25 MG TABS tablet Take 1 tablet (25 mg total) by mouth daily. 04/26/23   [name], MD  enoxaparin (LOVENOX) 40 MG/0.4ML injection Inject 0.3 mLs (30 mg total) into the skin daily for 25 days. For DVT prophylaxis after surgery 05/09/23 06/03/23  Dorcas Carrow, MD  ferrous sulfate 325 (65 FE) MG tablet Take 1 tablet (325 mg total) by mouth 3 (three) times daily after meals. 05/09/23   Dorcas Carrow, MD  HYDROcodone-acetaminophen (NORCO) 5-325 MG tablet Take 1-2 tablets by mouth every 4 (four) hours as needed for severe pain or moderate pain. 1 tablets for moderate pain, 2 tablet for severe pain 05/09/23   Dorcas Carrow, MD  magnesium oxide (MAG-OX) 400 (240 Mg) MG tablet Take 1 tablet (400 mg  total) by mouth daily. 04/28/23   [name], MD  melatonin 5 MG TABS Take 1 tablet (5 mg total) by mouth at bedtime. 02/24/23   Angiulli, Mcarthur Rossetti, PA-C  Multiple Vitamin (MULTIVITAMIN ADULT PO) Take 1 tablet by mouth daily.    [provider]  nitroGLYCERIN (NITROSTAT) 0.4 MG SL tablet Place 1 tablet (0.4 mg total) under the tongue every 5 (five) minutes as needed for chest pain. 02/24/23   Angiulli, Mcarthur Rossetti, PA-C  Omega 3 1000 MG CAPS Take 2 capsules by mouth in the morning and at bedtime.    [provider]  pantoprazole (PROTONIX) 40 MG tablet Take 1 tablet (40 mg total) by mouth daily. 04/26/23   [name], MD  polyethylene glycol (MIRALAX /  GLYCOLAX) 17 g packet Take 34 g by mouth daily. 05/09/23   Dorcas Carrow, MD  spironolactone (ALDACTONE) 50 MG tablet Take 1 tablet (50 mg total) by mouth daily. 04/26/23   [name], MD  torsemide (DEMADEX) 20 MG tablet Take 3 tablets (60 mg total) by mouth 2 (two) times daily. 04/26/23   [name], MD  umeclidinium-vilanterol [facility] ELLIPTA) 62.5-25 MCG/ACT AEPB Inhale 1 puff into the lungs daily. 04/26/23   [name], MD      Allergies    Patient has no known allergies.    Review of Systems   Review of Systems  Constitutional:  Negative for fever.  Respiratory:  Positive for cough (slight), chest tightness and shortness of breath.   Cardiovascular:  Negative for leg swelling.  Gastrointestinal:  Positive for abdominal distention, abdominal pain and constipation. Negative for vomiting.  Genitourinary:  Positive for difficulty urinating and frequency. Negative for dysuria.    Physical Exam Updated Vital Signs BP 107/63   Pulse 71   Temp (!) 97.5 F (36.4 C) (Oral)   Resp (!) 22   Ht 5\' 6"  (1.676 m)   Wt 74.8 kg   SpO2 96%   BMI 26.63 kg/m  Physical Exam Vitals and nursing note reviewed. Exam conducted with a chaperone present.  Constitutional:      Appearance: She is well-developed.  HENT:     Head: Normocephalic and atraumatic.  Cardiovascular:     Rate and Rhythm: Normal rate and regular rhythm.     Heart sounds: Normal heart sounds.  Pulmonary:     Effort: Pulmonary effort is normal. No accessory muscle usage or respiratory distress.     Breath sounds: Examination of the right-lower field reveals rales. Examination of the left-lower field reveals rales. Rales present.  Abdominal:     Palpations: Abdomen is soft.     Tenderness: There is abdominal tenderness in the right lower quadrant.  Genitourinary:    Comments: No obvious hemorrhoids on exam.  No gross blood.  There is a large amount of very soft stool.  No firm stool or impaction. Skin:    General: Skin is warm  and dry.  Neurological:     Mental Status: She is alert.     ED Results / Procedures / Treatments   Labs (all labs ordered are listed, but only abnormal results are displayed) Labs Reviewed  COMPREHENSIVE METABOLIC PANEL - Abnormal; Notable for the following components:      Result Value   Chloride 93 (*)    Glucose, Bld 176 (*)    BUN 70 (*)    Creatinine, Ser 2.29 (*)    Calcium 8.7 (*)    Total Protein 6.3 (*)  Albumin 2.7 (*)    GFR, Estimated 21 (*)    All other components within normal limits  CBC WITH DIFFERENTIAL/PLATELET - Abnormal; Notable for the following components:   RBC 3.47 (*)    Hemoglobin 9.6 (*)    HCT 31.9 (*)    RDW 19.5 (*)    All other components within normal limits  BRAIN NATRIURETIC PEPTIDE - Abnormal; Notable for the following components:   B Natriuretic Peptide 2,985.3 (*)    All other components within normal limits  URINALYSIS, W/ REFLEX TO CULTURE (INFECTION SUSPECTED) - Abnormal; Notable for the following components:   APPearance CLOUDY (*)    Glucose, UA 150 (*)    Hgb urine dipstick MODERATE (*)    Leukocytes,Ua LARGE (*)    Bacteria, UA RARE (*)    All other components within normal limits  I-STAT CHEM 8, ED - Abnormal; Notable for the following components:   Sodium 134 (*)    Chloride 95 (*)    BUN 63 (*)    Creatinine, Ser 2.10 (*)    Glucose, Bld 180 (*)    Calcium, Ion 1.07 (*)    Hemoglobin 10.5 (*)    HCT 31.0 (*)    All other components within normal limits  TROPONIN I (HIGH SENSITIVITY) - Abnormal; Notable for the following components:   Troponin I (High Sensitivity) 26 (*)    All other components within normal limits  SARS CORONAVIRUS 2 BY RT PCR  URINE CULTURE  HEPARIN LEVEL (UNFRACTIONATED)  TROPONIN I (HIGH SENSITIVITY)    EKG EKG Interpretation Date/Time:  Friday May 20 2023 12:38:28 EDT Ventricular Rate:  72 PR Interval:  209 QRS Duration:  172 QT Interval:  456 QTC Calculation: 500 R  Axis:   -68  Text Interpretation: Sinus rhythm Probable left atrial enlargement 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 EXAM: PORTABLE CHEST 1 VIEW COMPARISON:  05/03/2023. FINDINGS: There are layering bilateral pleural effusions with probable underlying compressive atelectatic changes in the bilateral lower lobes. No pneumothorax. Stable cardio-mediastinal silhouette. No acute osseous abnormalities. The soft tissues are within normal limits. IMPRESSION: 1. Layering bilateral pleural effusions with probable underlying compressive atelectatic changes in the bilateral lower lobes. Electronically Signed   By: Jules Schick M.D.   On: 05/20/2023 14:00   CT Angio Chest PE W and/or Wo Contrast  Result Date: 05/20/2023 CLINICAL DATA:  Pulmonary embolism (PE) suspected, high prob; Bowel obstruction suspected. Abdominal pain. Shortness of breath. EXAM: CT ANGIOGRAPHY CHEST CT ABDOMEN AND PELVIS WITH CONTRAST TECHNIQUE: Multidetector CT imaging of the chest was performed using the standard protocol during bolus administration of intravenous contrast. Multiplanar CT image reconstructions and MIPs were obtained to evaluate the vascular anatomy. Multidetector CT imaging of the abdomen and pelvis was performed using the standard protocol during bolus administration of intravenous contrast. RADIATION DOSE REDUCTION: This exam was performed according to the departmental dose-optimization program which includes automated exposure control, adjustment of the mA and/or kV according to patient size and/or use of iterative reconstruction technique. CONTRAST:  70mL OMNIPAQUE IOHEXOL 350 MG/ML SOLN COMPARISON:  CT angiography chest from 04/01/2023. FINDINGS: CTA CHEST FINDINGS Cardiovascular: There is small volume nonocclusive filling defects in the bilateral  lower lobe distal segmental and proximal subsegmental pulmonary artery levels (marked with electronic arrow sign on series 3). No right heart strain or lung  infarction. There is at moderate cardiomegaly. No pericardial effusion. No aortic aneurysm. There are coronary artery calcifications, in keeping with coronary artery disease. There are also moderate peripheral atherosclerotic vascular calcifications of thoracic aorta and its major branches. Mediastinum/Nodes: Visualized thyroid gland appears grossly unremarkable. No solid / cystic mediastinal masses. The esophagus is nondistended precluding optimal assessment. There are few mildly enlarged mediastinal and hilar lymph nodes, which are indeterminate but appear grossly similar to the prior study. No axillary lymphadenopathy by size criteria. Lungs/Pleura: The central tracheo-bronchial tree is patent. There is mild, smooth, circumferential thickening of the segmental and subsegmental bronchial walls, throughout bilateral lungs, which is nonspecific. Findings are most commonly seen with bronchitis or reactive airway disease, such as asthma. There is trace left and small right pleural effusion with associated compressive atelectatic changes in the bilateral lower lobes. There are additional patchy opacities in the left lung lower lobe and apicoposterior segment of left upper lobe, favored to represent multilobar pneumonia. There is mild peripheral/subpleural reticulations throughout bilateral lungs, nonspecific. No associated bronchiectasis or honeycombing. There are mild upper lobe predominant centrilobular emphysematous changes. There are patchy areas of smooth interlobular septal thickening throughout bilateral lungs, which can be seen with congestive heart failure/pulmonary edema. Musculoskeletal: The visualized soft tissues of the chest wall are grossly unremarkable. No suspicious osseous lesions. There are mild multilevel degenerative changes in the visualized  spine. Review of the MIP images confirms the above findings. CT ABDOMEN and PELVIS FINDINGS Hepatobiliary: The liver is normal in size. Non-cirrhotic configuration. No suspicious mass. These is mild diffuse hepatic steatosis. No intrahepatic bile duct dilation. There is mild prominence of the extrahepatic bile duct, most likely due to post cholecystectomy status. Gallbladder is surgically absent. Pancreas: Unremarkable. No pancreatic ductal dilatation or surrounding inflammatory changes. Spleen: Normal in size. There are subcentimeter calcified granulomas in the spleen. No other focal lesion. Adrenals/Urinary Tract: Adrenal glands are unremarkable. No suspicious renal mass. There are multiple bilateral simple renal cysts with largest in the left kidney lower pole measuring up to 2.0 x 2.1 cm. No hydronephrosis. No renal or ureteric calculi. Unremarkable urinary bladder. Stomach/Bowel: There are 2 smaller adjacent diverticula arising from the distal duodenum. No disproportionate dilation of the small or large bowel loops. No evidence of abnormal bowel wall thickening or inflammatory changes. The appendix was not visualized; however there is no acute inflammatory process in the right lower quadrant. There are multiple diverticula mainly in the left hemi colon, without imaging signs of diverticulitis. At least moderate stool burden noted. Vascular/Lymphatic: No ascites or pneumoperitoneum. No abdominal or pelvic lymphadenopathy, by size criteria. No aneurysmal dilation of the major abdominal arteries. There are marked peripheral atherosclerotic vascular calcifications of the aorta and its major branches. Reproductive: The uterus is surgically absent. No large adnexal mass. Other: There is a small fat containing umbilical hernia. Atrophy and fatty replacement of pelvic musculature noted. Musculoskeletal: No suspicious osseous lesions. There are moderate multilevel degenerative changes in the visualized spine. Right hip  arthroplasty noted. Review of the MIP images confirms the above findings. IMPRESSION: 1. Small volume nonocclusive pulmonary embolism in the bilateral lower lobe distal segmental and proximal subsegmental pulmonary artery levels. No right heart strain or lung infarction. 2. Trace left and small right pleural effusions with associated compressive atelectatic changes in the bilateral lower lobes. Patchy areas of smooth interlobular septal thickening throughout bilateral lungs, which can be seen with congestive heart failure/pulmonary edema. 3. Patchy opacities in the left lung lower lobe and apicoposterior segment  of left upper lobe, favored to represent multilobar pneumonia. 4. Multiple other nonacute observations, as described above. Aortic Atherosclerosis (ICD10-I70.0) and Emphysema (ICD10-J43.9). Critical Value/emergent results were called by telephone at the time of interpretation on 05/20/2023 at 1:56 pm to provider Pricilla Loveless , who verbally acknowledged these results. Electronically Signed   By: Jules Schick M.D.   On: 05/20/2023 13:59   CT ABDOMEN PELVIS W CONTRAST  Result Date: 05/20/2023 CLINICAL DATA:  Pulmonary embolism (PE) suspected, high prob; Bowel obstruction suspected. Abdominal pain. Shortness of breath. EXAM: CT ANGIOGRAPHY CHEST CT ABDOMEN AND PELVIS WITH CONTRAST TECHNIQUE: Multidetector CT imaging of the chest was performed using the standard protocol during bolus administration of intravenous contrast. Multiplanar CT image reconstructions and MIPs were obtained to evaluate the vascular anatomy. Multidetector CT imaging of the abdomen and pelvis was performed using the standard protocol during bolus administration of intravenous contrast. RADIATION DOSE REDUCTION: This exam was performed according to the departmental dose-optimization program which includes automated exposure control, adjustment of the mA and/or kV according to patient size and/or use of iterative reconstruction  technique. CONTRAST:  70mL OMNIPAQUE IOHEXOL 350 MG/ML SOLN COMPARISON:  CT angiography chest from 04/01/2023. FINDINGS: CTA CHEST FINDINGS Cardiovascular: There is small volume nonocclusive filling defects in the bilateral lower lobe distal segmental and proximal subsegmental pulmonary artery levels (marked with electronic arrow sign on series 3). No right heart strain or lung infarction. There is at moderate cardiomegaly. No pericardial effusion. No aortic aneurysm. There are coronary artery calcifications, in keeping with coronary artery disease. There are also moderate peripheral atherosclerotic vascular calcifications of thoracic aorta and its major branches. Mediastinum/Nodes: Visualized thyroid gland appears grossly unremarkable. No solid / cystic mediastinal masses. The esophagus is nondistended precluding optimal assessment. There are few mildly enlarged mediastinal and hilar lymph nodes, which are indeterminate but appear grossly similar to the prior study. No axillary lymphadenopathy by size criteria. Lungs/Pleura: The central tracheo-bronchial tree is patent. There is mild, smooth, circumferential thickening of the segmental and subsegmental bronchial walls, throughout bilateral lungs, which is nonspecific. Findings are most commonly seen with bronchitis or reactive airway disease, such as asthma. There is trace left and small right pleural effusion with associated compressive atelectatic changes in the bilateral lower lobes. There are additional patchy opacities in the left lung lower lobe and apicoposterior segment of left upper lobe, favored to represent multilobar pneumonia. There is mild peripheral/subpleural reticulations throughout bilateral lungs, nonspecific. No associated bronchiectasis or honeycombing. There are mild upper lobe predominant centrilobular emphysematous changes. There are patchy areas of smooth interlobular septal thickening throughout bilateral lungs, which can be seen with  congestive heart failure/pulmonary edema. Musculoskeletal: The visualized soft tissues of the chest wall are grossly unremarkable. No suspicious osseous lesions. There are mild multilevel degenerative changes in the visualized spine. Review of the MIP images confirms the above findings. CT ABDOMEN and PELVIS FINDINGS Hepatobiliary: The liver is normal in size. Non-cirrhotic configuration. No suspicious mass. These is mild diffuse hepatic steatosis. No intrahepatic bile duct dilation. There is mild prominence of the extrahepatic bile duct, most likely due to post cholecystectomy status. Gallbladder is surgically absent. Pancreas: Unremarkable. No pancreatic ductal dilatation or surrounding inflammatory changes. Spleen: Normal in size. There are subcentimeter calcified granulomas in the spleen. No other focal lesion. Adrenals/Urinary Tract: Adrenal glands are unremarkable. No suspicious renal mass. There are multiple bilateral simple renal cysts with largest in the left kidney lower pole measuring up to 2.0 x 2.1 cm. No hydronephrosis. No renal  or ureteric calculi. Unremarkable urinary bladder. Stomach/Bowel: There are 2 smaller adjacent diverticula arising from the distal duodenum. No disproportionate dilation of the small or large bowel loops. No evidence of abnormal bowel wall thickening or inflammatory changes. The appendix was not visualized; however there is no acute inflammatory process in the right lower quadrant. There are multiple diverticula mainly in the left hemi colon, without imaging signs of diverticulitis. At least moderate stool burden noted. Vascular/Lymphatic: No ascites or pneumoperitoneum. No abdominal or pelvic lymphadenopathy, by size criteria. No aneurysmal dilation of the major abdominal arteries. There are marked peripheral atherosclerotic vascular calcifications of the aorta and its major branches. Reproductive: The uterus is surgically absent. No large adnexal mass. Other: There is a small  fat containing umbilical hernia. Atrophy and fatty replacement of pelvic musculature noted. Musculoskeletal: No suspicious osseous lesions. There are moderate multilevel degenerative changes in the visualized spine. Right hip arthroplasty noted. Review of the MIP images confirms the above findings. IMPRESSION: 1. Small volume nonocclusive pulmonary embolism in the bilateral lower lobe distal segmental and proximal subsegmental pulmonary artery levels. No right heart strain or lung infarction. 2. Trace left and small right pleural effusions with associated compressive atelectatic changes in the bilateral lower lobes. Patchy areas of smooth interlobular septal thickening throughout bilateral lungs, which can be seen with congestive heart failure/pulmonary edema. 3. Patchy opacities in the left lung lower lobe and apicoposterior segment of left upper lobe, favored to represent multilobar pneumonia. 4. Multiple other nonacute observations, as described above. Aortic Atherosclerosis (ICD10-I70.0) and Emphysema (ICD10-J43.9). Critical Value/emergent results were called by telephone at the time of interpretation on 05/20/2023 at 1:56 pm to provider Pricilla Loveless , who verbally acknowledged these results. Electronically Signed   By: Jules Schick M.D.   On: 05/20/2023 13:59    Procedures .Critical Care  Performed by: Pricilla Loveless, MD Authorized by: Pricilla Loveless, MD   Critical care provider statement:    Critical care time (minutes):  35   Critical care time was exclusive of:  Separately billable procedures and treating other patients   Critical care was necessary to treat or prevent imminent or life-threatening deterioration of the following conditions:  Respiratory failure   Critical care was time spent personally by me on the following activities:  Development of treatment plan with patient or surrogate, discussions with consultants, evaluation of patient's response to treatment, examination of patient,  ordering and review of laboratory studies, ordering and review of radiographic studies, ordering and performing treatments and interventions, pulse oximetry, re-evaluation of patient's condition and review of old charts     Medications Ordered in ED Medications  cefTRIAXone (ROCEPHIN) 1 g in sodium chloride 0.9 % 100 mL IVPB (1 g Intravenous New Bag/Given 05/20/23 1506)  azithromycin (ZITHROMAX) 500 mg in sodium chloride 0.9 % 250 mL IVPB (500 mg Intravenous New Bag/Given 05/20/23 1507)  heparin bolus via infusion 4,000 Units (has no administration in time range)  heparin ADULT infusion 100 units/mL (25000 units/257mL) (has no administration in time range)  mineral oil enema 1 enema (1 enema Rectal Given 05/20/23 1326)  iohexol (OMNIPAQUE) 350 MG/ML injection 70 mL (70 mLs Intravenous Contrast Given 05/20/23 1311)    ED Course/ Medical Decision Making/ A&P                                 Medical Decision Making Amount and/or Complexity of Data Reviewed Labs: ordered.    Details:  UTI, elevated BNP Radiology: ordered and independent interpretation performed.    Details: Bilateral PE ECG/medicine tests: ordered and independent interpretation performed.    Details: LVH  Risk OTC drugs. Prescription drug management. Decision regarding hospitalization.   Most importantly, patient is found to have bilateral PEs.  They are not large but would account for some of her shortness of breath.  She also appears to have some degree of CHF and probable pneumonia and has been coughing a lot in the room.  She also has a UTI which goes along with her symptoms and will start some Rocephin.  Will add on azithromycin for the shortness of breath.  She will be started on heparin.  She is never had any serious bleeding issues.  There is no bowel obstruction.  I discussed her CT findings with radiology and she will need to be admitted to the hospitalist service and I have discussed with Dr.  Joylene Igo.        Final Clinical Impression(s) / ED Diagnoses Final diagnoses:  Acute pulmonary embolism without acute cor pulmonale, unspecified pulmonary embolism type (HCC)  Acute urinary tract infection    Rx / DC Orders ED Discharge Orders     None         Pricilla Loveless, MD 05/20/23 1528

## 2023-05-20 NOTE — Assessment & Plan Note (Signed)
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

## 2023-05-20 NOTE — ED Notes (Signed)
Report called to pt's facility to let them know she is being admitted. Spoke to Achille, Charity fundraiser. [phone].

## 2023-05-21 ENCOUNTER — Inpatient Hospital Stay (HOSPITAL_COMMUNITY): Payer: Medicare Other

## 2023-05-21 DIAGNOSIS — I2609 Other pulmonary embolism with acute cor pulmonale: Secondary | ICD-10-CM

## 2023-05-21 DIAGNOSIS — I2699 Other pulmonary embolism without acute cor pulmonale: Secondary | ICD-10-CM

## 2023-05-21 DIAGNOSIS — M7989 Other specified soft tissue disorders: Secondary | ICD-10-CM

## 2023-05-21 DIAGNOSIS — J449 Chronic obstructive pulmonary disease, unspecified: Secondary | ICD-10-CM

## 2023-05-21 DIAGNOSIS — Z8679 Personal history of other diseases of the circulatory system: Secondary | ICD-10-CM | POA: Diagnosis not present

## 2023-05-21 DIAGNOSIS — I5023 Acute on chronic systolic (congestive) heart failure: Secondary | ICD-10-CM | POA: Diagnosis not present

## 2023-05-21 DIAGNOSIS — Z96641 Presence of right artificial hip joint: Secondary | ICD-10-CM

## 2023-05-21 DIAGNOSIS — N179 Acute kidney failure, unspecified: Secondary | ICD-10-CM | POA: Diagnosis not present

## 2023-05-21 LAB — GLUCOSE, CAPILLARY
Glucose-Capillary: 143 mg/dL — ABNORMAL HIGH (ref 70–99)
Glucose-Capillary: 159 mg/dL — ABNORMAL HIGH (ref 70–99)
Glucose-Capillary: 147 mg/dL — ABNORMAL HIGH (ref 70–99)
Glucose-Capillary: 187 mg/dL — ABNORMAL HIGH (ref 70–99)

## 2023-05-21 LAB — CBC
HCT: 31.9 % — ABNORMAL LOW (ref 36.0–46.0)
Hemoglobin: 9.6 g/dL — ABNORMAL LOW (ref 12.0–15.0)
MCH: 27.7 pg (ref 26.0–34.0)
MCHC: 30.1 g/dL (ref 30.0–36.0)
MCV: 91.9 fL (ref 80.0–100.0)
Platelets: 290 10*3/uL (ref 150–400)
RBC: 3.47 MIL/uL — ABNORMAL LOW (ref 3.87–5.11)
RDW: 19.7 % — ABNORMAL HIGH (ref 11.5–15.5)
WBC: 9 10*3/uL (ref 4.0–10.5)
nRBC: 0 % (ref 0.0–0.2)

## 2023-05-21 LAB — ECHOCARDIOGRAM LIMITED
Height: 66 in
Weight: 2712.54 oz

## 2023-05-21 LAB — BASIC METABOLIC PANEL
Anion gap: 10 (ref 5–15)
BUN: 65 mg/dL — ABNORMAL HIGH (ref 8–23)
CO2: 30 mmol/L (ref 22–32)
Calcium: 8.5 mg/dL — ABNORMAL LOW (ref 8.9–10.3)
Chloride: 92 mmol/L — ABNORMAL LOW (ref 98–111)
Creatinine, Ser: 1.94 mg/dL — ABNORMAL HIGH (ref 0.44–1.00)
GFR, Estimated: 26 mL/min — ABNORMAL LOW (ref >60)
Glucose, Bld: 184 mg/dL — ABNORMAL HIGH (ref 70–99)
Potassium: 3.7 mmol/L (ref 3.5–5.1)
Sodium: 132 mmol/L — ABNORMAL LOW (ref 135–145)

## 2023-05-21 LAB — APTT: aPTT: 103 seconds — ABNORMAL HIGH (ref 24–36)

## 2023-05-21 LAB — HEPARIN LEVEL (UNFRACTIONATED): Heparin Unfractionated: 0.69 IU/mL (ref 0.30–0.70)

## 2023-05-21 MED ORDER — EMPAGLIFLOZIN 10 MG PO TABS
10.0000 mg | ORAL_TABLET | Freq: Every day | ORAL | Status: DC
  Administered 2023-05-21 – 2023-05-24 (×4): 10 mg via ORAL
  Filled 2023-05-21 (×4): qty 1

## 2023-05-21 MED ORDER — APIXABAN 5 MG PO TABS: 5.0000 mg | ORAL_TABLET | Freq: Two times a day (BID) | ORAL | Status: DC

## 2023-05-21 MED ORDER — APIXABAN 5 MG PO TABS
10.0000 mg | ORAL_TABLET | Freq: Two times a day (BID) | ORAL | Status: DC
  Administered 2023-05-21 – 2023-05-24 (×7): 10 mg via ORAL
  Filled 2023-05-21 (×7): qty 2

## 2023-05-21 MED ORDER — FUROSEMIDE 10 MG/ML IJ SOLN
40.0000 mg | Freq: Two times a day (BID) | INTRAMUSCULAR | Status: DC
  Administered 2023-05-21 – 2023-05-22 (×3): 40 mg via INTRAVENOUS
  Filled 2023-05-21 (×3): qty 4

## 2023-05-21 NOTE — Progress Notes (Signed)
Received call from unit secretary that patient said she was having shortness of breath. Upon entering room, patient c/o of feeling hot. SpO2 98%. Patient reports it feels like an anxiety attack. Buspar given at this time, door to room left open.

## 2023-05-21 NOTE — Assessment & Plan Note (Signed)
Echocardiogram with reduced LV systolic function, with EF 20 to 25%, global hypokinesis, RV systolic function with moderate reduction, RV with mild dilatation, RVSP 52.9, moderate mitral valve regurgitation, mild TR   Lower extremities US doppler negative for deep vein thrombosis.   02 saturation is 94% on 4 L/min per [place].  Patient hemodynamically stable, has been transitioned to direct oral anticoagulation with apixaban with good toleration.

## 2023-05-21 NOTE — Progress Notes (Signed)
  Echocardiogram 2D Echocardiogram has been performed.  Angela Lucero 05/21/2023, 2:28 PM

## 2023-05-21 NOTE — Progress Notes (Signed)
VASCULAR LAB    Bilateral lower extremity venous duplex has been performed.  See CV proc for preliminary results.   Ahmad Vanwey, RVT 05/21/2023, 2:31 PM

## 2023-05-21 NOTE — Progress Notes (Signed)
PT Cancellation Note  Patient Details Name: Kelby Broadbent MRN: [id number] DOB: 03-17-1941   Cancelled Treatment:    Reason Eval/Treat Not Completed: Patient not medically ready - awaiting 24 hours on heparin prior to initiating PT as is protocol. PT to check back tomorrow.   Marye Round, PT DPT [facility] Services Secure Chat Preferred  Office [phone]    Truddie Coco 05/21/2023, 2:25 PM

## 2023-05-21 NOTE — Progress Notes (Signed)
Progress Note  Patient Name: Angela Lucero Date of Encounter: 05/21/2023  Primary Cardiologist: Gypsy Balsam, MD   Subjective   Unable to assess -- patient is off the floor  Inpatient Medications    Scheduled Meds:  apixaban  10 mg Oral BID   Followed by   Melene Muller ON 05/28/2023] apixaban  5 mg Oral BID   aspirin EC  81 mg Oral Daily   atorvastatin  80 mg Oral Daily   busPIRone  10 mg Oral TID   carvedilol  6.25 mg Oral BID WC   cholecalciferol  10,000 Units Oral Q0600   empagliflozin  10 mg Oral Daily   ferrous sulfate  325 mg Oral TID PC   furosemide  40 mg Intravenous BID   insulin aspart  0-15 Units Subcutaneous TID WC   melatonin  5 mg Oral QHS   multivitamin with minerals   Oral Daily   pantoprazole  40 mg Oral Daily   polyethylene glycol  34 g Oral Daily   sodium chloride flush  3 mL Intravenous Q12H   umeclidinium-vilanterol  1 puff Inhalation Daily   Continuous Infusions:  sodium chloride     cefTRIAXone (ROCEPHIN)  IV 1 g (05/21/23 1204)   PRN Meds: sodium chloride, acetaminophen **OR** acetaminophen, albuterol, HYDROcodone-acetaminophen, nitroGLYCERIN, ondansetron **OR** ondansetron (ZOFRAN) IV, sodium chloride flush   Vital Signs    Vitals:   05/21/23 0028 05/21/23 0444 05/21/23 0724 05/21/23 1205  BP: (!) 100/45 114/65 (!) 123/46 (!) 109/59  Pulse: 67 79 75 78  Resp: 19 18 17 20   Temp: (!) 97.3 F (36.3 C) (!) 97.4 F (36.3 C) (!) 97.5 F (36.4 C) (!) 97.5 F (36.4 C)  TempSrc: Oral Oral Oral Oral  SpO2: 98% 97% 98% 100%  Weight:  76.9 kg    Height:        Intake/Output Summary (Last 24 hours) at 05/21/2023 1410 Last data filed at 05/21/2023 1204 Gross per 24 hour  Intake 892.2 ml  Output 900 ml  Net -7.8 ml   Filed Weights   05/20/23 1214 05/21/23 0444  Weight: 74.8 kg 76.9 kg    Telemetry    Sinus rhythm - Personally Reviewed  ECG    Sinus rhythm RBBB, LAFB - Personally Reviewed  Physical Exam   Deferred.  Labs     Chemistry Recent Labs  Lab 05/20/23 1230 05/20/23 1238 05/21/23 0311  NA 136 134* 132*  K 4.5 4.5 3.7  CL 93* 95* 92*  CO2 29  --  30  GLUCOSE 176* 180* 184*  BUN 70* 63* 65*  CREATININE 2.29* 2.10* 1.94*  CALCIUM 8.7*  --  8.5*  PROT 6.3*  --   --   ALBUMIN 2.7*  --   --   AST 17  --   --   ALT 12  --   --   ALKPHOS 62  --   --   BILITOT 0.5  --   --   GFRNONAA 21*  --  26*  ANIONGAP 14  --  10     Hematology Recent Labs  Lab 05/20/23 1230 05/20/23 1238 05/21/23 0311  WBC 9.0  --  9.0  RBC 3.47*  --  3.47*  HGB 9.6* 10.5* 9.6*  HCT 31.9* 31.0* 31.9*  MCV 91.9  --  91.9  MCH 27.7  --  27.7  MCHC 30.1  --  30.1  RDW 19.5*  --  19.7*  PLT 335  --  290  Cardiac EnzymesNo results for input(s): "TROPONINI" in the last 168 hours. No results for input(s): "TROPIPOC" in the last 168 hours.   BNP Recent Labs  Lab 05/20/23 1230  BNP 2,985.3*     DDimer No results for input(s): "DDIMER" in the last 168 hours.   Summary of Pertinent studies    TTE: Limited TTE this admission ordered  Cardiac cath: Last 11/2021 CTO of mid LAD with right to left and left to left collaterals.  Moderate circumflex and RCA disease.  Patent distal RCA stent with mid in-stent restenosis  Imaging: CT pulmonary angiogram September 20: Small volume nonocclusive PE in the bilateral lower lobe distal segmental and proximal subsegmental pulmonary artery  Labs: Creatinine 1.94, improved from 2.29; sodium downtrending from 1 36-1 32.  Patient Profile     82 y.o. female with a hx of  CAD s/p PTCA, COPD on home oxygen, HFrEF, history of CVA, type 2 DM, gout, HLD, HTN, CKD stage IIIb, severe MR,  admit earlier this month with hip fracture s/p surgery who is being seen 05/20/2023 for the evaluation of SOB at the request of Dr Joylene Igo.   Assessment & Plan    Acute on chronic CHF with reduced EF Echocardiogram September 2024 with EF 20-25, unchanged from August 2024. Continue empagliflozin,  carvedilol 6.25  Severe mitral regurgitation In setting of severe systolic dysfunction Continue to follow with diuresis as outpatient I do not anticipate further intervention this admission  Pulmonary emboli Provoked PE in the setting of recent orthopedic surgery and admission CTA without signs of RV strain Continue apixaban  AKI on CKD Creatinine 2.29 on admission, baseline 2 weeks ago 1.3-1.5 Patient received dye load for CT PE  History of coronary disease Stable, no angina Continue aspirin, carvedilol, atorvastatin   For questions or updates, please contact [facility] Please consult [email or web address] for contact info under Cardiology/STEMI.      Signed, Maurice Small, MD 05/21/2023, 2:10 PM

## 2023-05-21 NOTE — Progress Notes (Addendum)
ANTICOAGULATION CONSULT NOTE Pharmacy Consult for heparin Indication: pulmonary embolus Brief A/P: Heparin level slightly supratherapeutic No change to heparin for now   No Known Allergies  Patient Measurements: Height: 5\' 6"  (167.6 cm) Weight: 76.9 kg (169 lb 8.5 oz) IBW/kg (Calculated) : 59.3 Heparin Dosing Weight: 74 kg   Vital Signs: Temp: 97.5 F (36.4 C) (09/21 0724) Temp Source: Oral (09/21 0724) BP: 123/46 (09/21 0724) Pulse Rate: 75 (09/21 0724)  Labs: Recent Labs    05/20/23 1230 05/20/23 1238 05/20/23 1505 05/20/23 2302 05/21/23 0311  HGB 9.6* 10.5*  --   --  9.6*  HCT 31.9* 31.0*  --   --  31.9*  PLT 335  --   --   --  290  APTT  --   --   --  103*  --   HEPARINUNFRC  --   --   --  0.72* 0.69  CREATININE 2.29* 2.10*  --   --  1.94*  TROPONINIHS 26*  --  24*  --   --     Estimated Creatinine Clearance: 24.2 mL/min (A) (by C-G formula based on SCr of 1.94 mg/dL (H)).  Assessment:  82 y.o. female with PE for heparin. Patient on PTA enoxaparin for DVT PPX after surgery (to stop 06/03/23). Last dose 05/20/23.  9/21 AM: HL 0.69, theraputic at 1200 units/hr. Level on higher end, will watch closely. CBC wnl. No signs of bleeding or issues with heparin infusion per RN.    Goal of Therapy:  Heparin level 0.3-0.7 units/ml Monitor platelets by anticoagulation protocol: Yes   Plan:  No change to heparin for now Continue Heparin 1200 units/hr 8 hr Heparin level Monitor daily HL, CBC, signs of bleeding   Verdene Rio, PharmD PGY1 Pharmacy Resident --  Addendum 9/21: heparin >> apixaban

## 2023-05-21 NOTE — Plan of Care (Signed)
  Problem: Education: Goal: Verbalization of understanding the information provided (i.e., activity precautions, restrictions, etc) will improve Outcome: Progressing Goal: Individualized Educational Video(s) Outcome: Progressing   Problem: Activity: Goal: Ability to ambulate and perform ADLs will improve Outcome: Progressing   Problem: Clinical Measurements: Goal: Postoperative complications will be avoided or minimized Outcome: Progressing   Problem: Self-Concept: Goal: Ability to maintain and perform role responsibilities to the fullest extent possible will improve Outcome: Progressing   Problem: Pain Management: Goal: Pain level will decrease Outcome: Progressing   Problem: Education: Goal: Ability to demonstrate management of disease process will improve Outcome: Progressing Goal: Ability to verbalize understanding of medication therapies will improve Outcome: Progressing Goal: Individualized Educational Video(s) Outcome: Progressing   Problem: Activity: Goal: Capacity to carry out activities will improve Outcome: Progressing   Problem: Cardiac: Goal: Ability to achieve and maintain adequate cardiopulmonary perfusion will improve Outcome: Progressing   Problem: Education: Goal: Ability to describe self-care measures that may prevent or decrease complications (Diabetes Survival Skills Education) will improve Outcome: Progressing Goal: Individualized Educational Video(s) Outcome: Progressing   Problem: Coping: Goal: Ability to adjust to condition or change in health will improve Outcome: Progressing   Problem: Fluid Volume: Goal: Ability to maintain a balanced intake and output will improve Outcome: Progressing   Problem: Health Behavior/Discharge Planning: Goal: Ability to identify and utilize available resources and services will improve Outcome: Progressing Goal: Ability to manage health-related needs will improve Outcome: Progressing   Problem:  Metabolic: Goal: Ability to maintain appropriate glucose levels will improve Outcome: Progressing   Problem: Nutritional: Goal: Maintenance of adequate nutrition will improve Outcome: Progressing Goal: Progress toward achieving an optimal weight will improve Outcome: Progressing   Problem: Skin Integrity: Goal: Risk for impaired skin integrity will decrease Outcome: Progressing   Problem: Tissue Perfusion: Goal: Adequacy of tissue perfusion will improve Outcome: Progressing   Problem: Education: Goal: Knowledge of General Education information will improve Description: Including pain rating scale, medication(s)/side effects and non-pharmacologic comfort measures Outcome: Progressing   Problem: Health Behavior/Discharge Planning: Goal: Ability to manage health-related needs will improve Outcome: Progressing   Problem: Clinical Measurements: Goal: Ability to maintain clinical measurements within normal limits will improve Outcome: Progressing Goal: Will remain free from infection Outcome: Progressing Goal: Diagnostic test results will improve Outcome: Progressing Goal: Respiratory complications will improve Outcome: Progressing Goal: Cardiovascular complication will be avoided Outcome: Progressing   Problem: Activity: Goal: Risk for activity intolerance will decrease Outcome: Progressing   Problem: Nutrition: Goal: Adequate nutrition will be maintained Outcome: Progressing   Problem: Coping: Goal: Level of anxiety will decrease Outcome: Progressing   Problem: Elimination: Goal: Will not experience complications related to bowel motility Outcome: Progressing Goal: Will not experience complications related to urinary retention Outcome: Progressing   Problem: Pain Managment: Goal: General experience of comfort will improve Outcome: Progressing   Problem: Safety: Goal: Ability to remain free from injury will improve Outcome: Progressing   Problem: Skin  Integrity: Goal: Risk for impaired skin integrity will decrease Outcome: Progressing

## 2023-05-21 NOTE — Hospital Course (Addendum)
Angela Lucero was admitted to the hospital with the working diagnosis of acute pulmonary embolism.   82 yo female with the past medical history of heart failure, coronary artery disease, COPD, CKD, T2DM and chronic hypoxia who presented with dyspnea. Recent hospitalization 09/01 to 05/09/23 closed right femoral neck fracture, sp left hemiarthroplasty on 09/03. Hospitalization complicated with acute heart failure decompensation. She was discharge to SNF.  While in the SNF she had chest pain and dyspnea. Because intensity of chest pain she was transported to the ED. On her initial physical examination her blood pressure was 104/59, HR 72, RR 27 and 02 saturation 96%, lungs with rales bilaterally, heart with S1 and S2 present and regular, abdomen with tenderness in the suprapubic region, positive lower extremity edema, more left than right.   Urine analysis with SG 1,012, large leukocytes, moderate Hgb, 21-50 wbc, 11-20 rbc.   Chest radiograph with cardiomegaly with bilateral hilar vascular congestion, bilateral basal increased interstitial marking with small right pleural effusion.   CT chest with small non occlusive pulmonary embolism in the bilateral lobe distal segmental and proximal subsegmental pulmonary artery levels. Bilateral ground glass opacities, with interlobular septal thickening and small right pleural effusion with compression atelectasis.

## 2023-05-21 NOTE — Progress Notes (Addendum)
Progress Note   Patient: Angela Lucero HQI:[id number] DOB: August 28, 1941 DOA: 05/20/2023     1 DOS: the patient was seen and examined on 05/21/2023   Brief hospital course: Angela Lucero was admitted to the hospital with the working diagnosis of acute pulmonary embolism.   82 yo female with the past medical history of heart failure, coronary artery disease, COPD, CKD, T2DM and chronic hypoxia who presented with dyspnea. Recent hospitalization 09/01 to 05/09/23 closed right femoral neck fracture, sp left hemiarthroplasty on 09/03. Hospitalization complicated with acute heart failure decompensation. She was discharge to SNF.  While in the SNF she had chest pain and dyspnea. Because intensity of chest pain she was transported to the ED. On her initial physical examination her blood pressure was 104/59, HR 72, RR 27 and 02 saturation 96%, lungs with rales bilaterally, heart with S1 and S2 present and regular, abdomen with tenderness in the suprapubic region, positive lower extremity edema, more left than right.   Urine analysis with SG 1,012, large leukocytes, moderate Hgb, 21-50 wbc, 11-20 rbc.   Chest radiograph with cardiomegaly with bilateral hilar vascular congestion, bilateral basal increased interstitial marking with small right pleural effusion.   CT chest with small non occlusive pulmonary embolism in the bilateral lobe distal segmental and proximal subsegmental pulmonary artery levels. Bilateral ground glass opacities, with interlobular septal thickening and small right pleural effusion with compression atelectasis.     Assessment and Plan: * Acute pulmonary embolism (HCC) Echocardiogram from 09/01 with reduced LV systolic function with severe dilatation of LV internal cavity. RV with preserved systolic function. Mitral and aortic valve with no significant disease.  02 saturation is 98% on 1 L/min per [place].  Chest pain has improved.  Systolic blood pressure 100 to 114 mmHg.   Plan to transition form  heparin to apixaban for anticoagulation. Check lower extremities doppler US to assess clot burden.    Acute on chronic systolic CHF (congestive heart failure) (HCC) Significant LV systolic dysfunction, with no valvular disease.   Documented urine output is 250 ml  Plan to continue diuresis with furosemide, resume with 40 mg IV q12 hrs.  Resume SGLT 2 inh.    AKI (acute kidney injury) (HCC) Hyponatremia. CKD stage 3a.   Renal function with serum cr at 1,94 with K at 3,7 and serum bicarbonate at 30. Na 132  Plan to continue diuresis with furosemide.  Follow up renal function in am.   History of CAD (coronary artery disease) Status post stent angioplasty Continue aspirin, carvedilol and atorvastatin  History of hemiarthroplasty of right hip Status post right hip hemiarthroplasty which was done on 05/03/23 for hip fracture We will request PT consult  UTI (urinary tract infection) Symptomatic Patient has dysuria and frequency of urination.  She also has pyuria Will place patient empirically on Rocephin 1 g IV daily Follow-up results of urine culture  COPD (chronic obstructive pulmonary disease) (HCC) No clinical signs of acute exacerbation. Plan to continue bronchodilator therapy. Hold on systemic corticosteroids.  Continue oxymetry monitoring and supplemental 0-2 per [place] to keep 02 saturation 88% or greater.   GAD (generalized anxiety disorder) Continue buspirone        Subjective: Patient with improvement in dyspnea and chest pain but not back to baseline,  Physical Exam: Vitals:   05/20/23 2206 05/21/23 0028 05/21/23 0444 05/21/23 0724  BP: (!) 102/58 (!) 100/45 114/65 (!) 123/46  Pulse: 69 67 79 75  Resp: 17 19 18 17   Temp: (!) 97.5 F (36.4  C) (!) 97.3 F (36.3 C) (!) 97.4 F (36.3 C) (!) 97.5 F (36.4 C)  TempSrc: Oral Oral Oral Oral  SpO2: 96% 98% 97% 98%  Weight:   76.9 kg   Height:       Neurology awake and alert ENT with mild  pallor Cardiovascular with S1 and S2 present and regular with no gallops, rubs or murmurs Respiratory with no rales or wheezing, no rhonchi Abdomen with no distention  No lower extremity edema  Data Reviewed:    Family Communication: no family at the bedside   Disposition: Status is: Inpatient Remains inpatient appropriate because: pulmonary embolism,   Planned Discharge Destination: Skilled nursing facility     Author: Coralie Keens, MD 05/21/2023 12:37 PM  For on call review [email or web address].

## 2023-05-21 NOTE — Assessment & Plan Note (Signed)
No clinical signs of acute exacerbation. Plan to continue bronchodilator therapy. Off systemic corticosteroids.  Continue oxymetry monitoring and supplemental 0-2 per [place] to keep 02 saturation 88% or greater.

## 2023-05-22 DIAGNOSIS — N179 Acute kidney failure, unspecified: Secondary | ICD-10-CM | POA: Diagnosis not present

## 2023-05-22 DIAGNOSIS — I2699 Other pulmonary embolism without acute cor pulmonale: Secondary | ICD-10-CM | POA: Diagnosis not present

## 2023-05-22 DIAGNOSIS — Z8679 Personal history of other diseases of the circulatory system: Secondary | ICD-10-CM | POA: Diagnosis not present

## 2023-05-22 DIAGNOSIS — I5023 Acute on chronic systolic (congestive) heart failure: Secondary | ICD-10-CM | POA: Diagnosis not present

## 2023-05-22 DIAGNOSIS — N390 Urinary tract infection, site not specified: Secondary | ICD-10-CM

## 2023-05-22 LAB — GLUCOSE, CAPILLARY
Glucose-Capillary: 140 mg/dL — ABNORMAL HIGH (ref 70–99)
Glucose-Capillary: 143 mg/dL — ABNORMAL HIGH (ref 70–99)
Glucose-Capillary: 134 mg/dL — ABNORMAL HIGH (ref 70–99)
Glucose-Capillary: 136 mg/dL — ABNORMAL HIGH (ref 70–99)

## 2023-05-22 LAB — BASIC METABOLIC PANEL
Anion gap: 10 (ref 5–15)
BUN: 62 mg/dL — ABNORMAL HIGH (ref 8–23)
CO2: 27 mmol/L (ref 22–32)
Calcium: 8.3 mg/dL — ABNORMAL LOW (ref 8.9–10.3)
Chloride: 94 mmol/L — ABNORMAL LOW (ref 98–111)
Creatinine, Ser: 1.84 mg/dL — ABNORMAL HIGH (ref 0.44–1.00)
GFR, Estimated: 27 mL/min — ABNORMAL LOW (ref >60)
Glucose, Bld: 188 mg/dL — ABNORMAL HIGH (ref 70–99)
Potassium: 4.1 mmol/L (ref 3.5–5.1)
Sodium: 131 mmol/L — ABNORMAL LOW (ref 135–145)

## 2023-05-22 MED ORDER — SODIUM CHLORIDE 0.9 % IV SOLN
1.0000 g | INTRAVENOUS | Status: DC
  Administered 2023-05-22: 1 g via INTRAVENOUS
  Filled 2023-05-22: qty 10

## 2023-05-22 MED ORDER — FUROSEMIDE 10 MG/ML IJ SOLN
20.0000 mg | Freq: Once | INTRAMUSCULAR | Status: AC
  Administered 2023-05-22: 20 mg via INTRAVENOUS
  Filled 2023-05-22: qty 2

## 2023-05-22 MED ORDER — FUROSEMIDE 10 MG/ML IJ SOLN
60.0000 mg | Freq: Two times a day (BID) | INTRAMUSCULAR | Status: DC
  Administered 2023-05-22: 60 mg via INTRAVENOUS
  Filled 2023-05-22 (×2): qty 6

## 2023-05-22 NOTE — Discharge Instructions (Signed)
Information on my medicine - ELIQUIS (apixaban)  This medication education was reviewed with me or my healthcare representative as part of my discharge preparation.    Why was Eliquis prescribed for you? Eliquis was prescribed to treat blood clots that may have been found in the veins of your legs (deep vein thrombosis) or in your lungs (pulmonary embolism) and to reduce the risk of them occurring again.  What do You need to know about Eliquis ? The starting dose is 10 mg (two 5 mg tablets) taken TWICE daily for the FIRST SEVEN (7) DAYS, then on (enter date)  ***  the dose is reduced to ONE 5 mg tablet taken TWICE daily.  Eliquis may be taken with or without food.   Try to take the dose about the same time in the morning and in the evening. If you have difficulty swallowing the tablet whole please discuss with your pharmacist how to take the medication safely.  Take Eliquis exactly as prescribed and DO NOT stop taking Eliquis without talking to the doctor who prescribed the medication.  Stopping may increase your risk of developing a new blood clot.  Refill your prescription before you run out.  After discharge, you should have regular check-up appointments with your healthcare provider that is prescribing your Eliquis.    What do you do if you miss a dose? If a dose of ELIQUIS is not taken at the scheduled time, take it as soon as possible on the same day and twice-daily administration should be resumed. The dose should not be doubled to make up for a missed dose.  Important Safety Information A possible side effect of Eliquis is bleeding. You should call your healthcare provider right away if you experience any of the following: ? Bleeding from an injury or your nose that does not stop. ? Unusual colored urine (red or dark brown) or unusual colored stools (red or black). ? Unusual bruising for unknown reasons. ? A serious fall or if you hit your head (even if there is no  bleeding).  Some medicines may interact with Eliquis and might increase your risk of bleeding or clotting while on Eliquis. To help avoid this, consult your healthcare provider or pharmacist prior to using any new prescription or non-prescription medications, including herbals, vitamins, non-steroidal anti-inflammatory drugs (NSAIDs) and supplements.  This website has more information on Eliquis (apixaban): http://www.eliquis.com/eliquis/home

## 2023-05-22 NOTE — Progress Notes (Addendum)
Progress Note   Patient: Angela Lucero AOZ:[id number] DOB: 09-23-1940 DOA: 05/20/2023     2 DOS: the patient was seen and examined on 05/22/2023   Brief hospital course: [name] was admitted to the hospital with the working diagnosis of acute pulmonary embolism and heart failure exacerbation.   82 yo female with the past medical history of heart failure, coronary artery disease, COPD, CKD, T2DM and chronic hypoxia who presented with dyspnea. Recent hospitalization 09/01 to 05/09/23 closed right femoral neck fracture, sp left hemiarthroplasty on 09/03. Hospitalization complicated with acute heart failure decompensation. She was discharge to SNF.  While in the SNF she had chest pain and dyspnea. Because intensity of chest pain she was transported to the ED. On her initial physical examination her blood pressure was 104/59, HR 72, RR 27 and 02 saturation 96%, lungs with rales bilaterally, heart with S1 and S2 present and regular, abdomen with tenderness in the suprapubic region, positive lower extremity edema, more left than right.   Na 136, K 4,5 Cl 93 bicarbonate 29, glucose 176 bun 70 cr 2,29  BNP 2,985 High sensitive troponin 26 and 24  Wbc 9,0 hgb 9,6 plt 335  Sars covid 19 negative  Urine analysis with SG 1,012, large leukocytes, moderate Hgb, 21-50 wbc, 11-20 rbc.   Chest radiograph with cardiomegaly with bilateral hilar vascular congestion, bilateral basal increased interstitial marking with small right pleural effusion.   CT chest with small non occlusive pulmonary embolism in the bilateral lobe distal segmental and proximal subsegmental pulmonary artery levels. Bilateral ground glass opacities, with interlobular septal thickening and small right pleural effusion with compression atelectasis.   EKG 72 bpm, left axis deviation, qtc 500, right bundle branch block, sinus rhythm with no significant ST segment or T wave changes.   09/22 patient transitioned to oral anticoagulation, her volume  status has improved. Large bowel movement.   Assessment and Plan: * Acute pulmonary embolism (HCC) Echocardiogram from 09/01 with reduced LV systolic function with severe dilatation of LV internal cavity. RV with preserved systolic function. Mitral and aortic valve with no significant disease.   Lower extremities US doppler negative for deep vein thrombosis.   02 saturation is 94% on 4 L/min per [name].  Patient hemodynamically stable, has been transitioned to direct oral anticoagulation with apixaban with good toleration.   Acute on chronic systolic CHF (congestive heart failure) (HCC) Echocardiogram with reduced LV systolic function 20 to 25%, global hypokinesis. RV function with moderate reduction, RV with mild enlargement, RVSP 52.9 mmHg, moderate MR,   Documented urine output is 950 ml Her weight is down 1 kg from yesterday.   Increase furosemide to 60 mg IV q12 hrs, Continue with SGLT 2 inh, carvedilol.  Holding on RAAS inhibition for now due to unstable GFR.   AKI (acute kidney injury) (HCC) Hyponatremia. CKD stage 3a.   Improved volume status but still not at baseline, renal function with serum cr at 1,84 with K at 4,1 and serum bicarbonate at 27. Na 131   Plan to continue diuresis with furosemide.  Follow up renal function in am.   History of CAD (coronary artery disease) Status post stent angioplasty Continue aspirin, carvedilol and atorvastatin  History of hemiarthroplasty of right hip Status post right hip hemiarthroplasty which was done on 05/03/23 for hip fracture PT and OT   UTI (urinary tract infection) Symptomatic Patient has dysuria and frequency of urination.  She also has pyuria Will place patient empirically on Rocephin 1 g IV daily  Follow-up results of urine culture  COPD (chronic obstructive pulmonary disease) (HCC) No clinical signs of acute exacerbation. Plan to continue bronchodilator therapy. Off systemic corticosteroids.  Continue oxymetry  monitoring and supplemental 0-2 per [name] to keep 02 saturation 88% or greater.   GAD (generalized anxiety disorder) Continue buspirone        Subjective: Patient had a large bowel movement with improvement in her symptoms, no chest pain,   Physical Exam: Vitals:   05/21/23 2353 05/22/23 0408 05/22/23 0740 05/22/23 0823  BP: 118/71 129/75 113/62   Pulse: 76 82 76 94  Resp: 16 (!) 24 (!) 22 (!) 21  Temp: 97.6 F (36.4 C) 98 F (36.7 C) 97.9 F (36.6 C)   TempSrc: Oral Axillary Oral   SpO2: 98% 97% 93% 94%  Weight:  75.3 kg    Height:       Neurology awake and alert ENT with mild pallor Cardiovascular with S1 and S2 present and regular with no gallops, rubs or murmurs Mild JVD Trace lower extremity edema Respiratory with mild rales at bases with no rales or wheezing, no rhonchi Abdomen with no distention  Data Reviewed:    Family Communication: no family at the bedside   Disposition: Status is: Inpatient Remains inpatient appropriate because: heart failure IV diuresis   Planned Discharge Destination: Skilled nursing facility    Author: Coralie Keens, MD 05/22/2023 11:25 AM  For on call review [email or web address].

## 2023-05-22 NOTE — Evaluation (Signed)
Physical Therapy Evaluation Patient Details Name: Angela Lucero MRN: [id number] DOB: 09/30/1940 Today's Date: 05/22/2023  History of Present Illness  Pt is a 82yo female who came from [facility] with acute pulmonary embolism and heart failure exacerbation. PSH: s/p R hemiarthroplasyy on 9/3 by Dr. Dion Saucier  PMH: CHF with last known LVEF 20-25%, COPD, DM, stage IIIa CKD, CAD, on 2-3L of O2 at home   Clinical Impression  Pt admitted with above however also was recently d/c'd this month s/p R post THA. Pt mobility greatly limited by DOE due to acute PE. Pt with generalized weakness, decreased activity tolerance, and requires assist for all mobility. Pt to continue to benefit from inpatient rehab program <3 hrs/day to address above deficits and to progress back towards indep to return home. Acute PT to cont to follow.      If plan is discharge home, recommend the following: Assistance with cooking/housework;Assist for transportation;Help with stairs or ramp for entrance;A little help with bathing/dressing/bathroom;A lot of help with walking and/or transfers   Can travel by private vehicle   No    Equipment Recommendations Other (comment) (TBD at next venue)  Recommendations for Other Services       Functional Status Assessment Patient has had a recent decline in their functional status and demonstrates the ability to make significant improvements in function in a reasonable and predictable amount of time.     Precautions / Restrictions Precautions Precautions: Posterior Hip Precaution Booklet Issued: Yes (comment) Precaution Comments: pt able to recall when asked Restrictions Weight Bearing Restrictions: Yes RLE Weight Bearing: Weight bearing as tolerated      Mobility  Bed Mobility Overal bed mobility: Needs Assistance Bed Mobility: Supine to Sit     Supine to sit: Contact guard     General bed mobility comments: HOB elevated, sleeps in recliner at home, used bed rail pt stated  "ahhh don't help me." Pt able to bring bilat LEs off EOB.    Transfers Overall transfer level: Needs assistance Equipment used: Rolling walker (2 wheels) Transfers: Sit to/from Stand, Bed to chair/wheelchair/BSC Sit to Stand: From elevated surface, Mod assist, Min assist   Step pivot transfers: Min assist       General transfer comment: minA for walker management during step pvt, noted SOB/DOE, pt reports "this is better than before"    Ambulation/Gait               General Gait Details: pt deferred due to impaired breathing and increased SOB with mobility  Stairs            Wheelchair Mobility     Tilt Bed    Modified Rankin (Stroke Patients Only)       Balance Overall balance assessment: Needs assistance Sitting-balance support: Bilateral upper extremity supported, Feet unsupported, Feet supported, Single extremity supported Sitting balance-Leahy Scale: Fair Sitting balance - Comments: able to complete bilat LE exercises   Standing balance support: Bilateral upper extremity supported, Reliant on assistive device for balance Standing balance-Leahy Scale: Poor Standing balance comment: dependent on RW and UEs to offweight LEs                             Pertinent Vitals/Pain Pain Assessment Pain Assessment: 0-10 Pain Score: 8  Pain Location: R hip pain Pain Descriptors / Indicators: Guarding, Grimacing, Sharp Pain Intervention(s): Monitored during session    Home Living Family/patient expects to be discharged to:: Skilled nursing facility  Additional Comments: pt was previously living alone but was d/c'd to [facility] on 9/9 following hospitalization from undergoing R THA    Prior Function Prior Level of Function : Needs assist             Mobility Comments: pt reports "I haven't walked yet." "I've only stood up." ADLs Comments: assist from staff at [facility]     Extremity/Trunk Assessment   Upper  Extremity Assessment Upper Extremity Assessment: Generalized weakness    Lower Extremity Assessment Lower Extremity Assessment: Generalized weakness (R LE requiring assist for hip flexion due to recent surgery)    Cervical / Trunk Assessment Cervical / Trunk Assessment: Normal  Communication   Communication Communication: No apparent difficulties  Cognition Arousal: Alert Behavior During Therapy: Anxious Overall Cognitive Status: Within Functional Limits for tasks assessed                                 General Comments: pt anxious regarding chance of increased pain when mobilizing and difficulty breathing. Pt able to follow commands        General Comments General comments (skin integrity, edema, etc.): SpO2 >92% on 4LO2 via [place]    Exercises Total Joint Exercises Ankle Circles/Pumps: AROM, Both, 10 reps Quad Sets: AROM, Both, 10 reps Long Arc Quad: AROM, Right, 20 reps (with 3 sec hold) Marching in Standing: AROM, Both, 5 reps (limited by onset of difficulty breathing)   Assessment/Plan    PT Assessment Patient needs continued PT services  PT Problem List Decreased strength;Decreased range of motion;Decreased activity tolerance;Decreased balance;Decreased mobility       PT Treatment Interventions DME instruction;Therapeutic exercise;Gait training;Balance training;Stair training;Neuromuscular re-education;Functional mobility training;Therapeutic activities;Patient/family education    PT Goals (Current goals can be found in the Care Plan section)  Acute Rehab PT Goals Patient Stated Goal: return to rehab PT Goal Formulation: With patient Time For Goal Achievement: 06/06/23 Potential to Achieve Goals: Fair    Frequency Min 1X/week     Co-evaluation               AM-PAC PT "6 Clicks" Mobility  Outcome Measure Help needed turning from your back to your side while in a flat bed without using bedrails?: A Lot Help needed moving from lying on your  back to sitting on the side of a flat bed without using bedrails?: A Little Help needed moving to and from a bed to a chair (including a wheelchair)?: A Little Help needed standing up from a chair using your arms (e.g., wheelchair or bedside chair)?: A Little Help needed to walk in hospital room?: A Lot Help needed climbing 3-5 steps with a railing? : A Lot 6 Click Score: 15    End of Session Equipment Utilized During Treatment: Oxygen Activity Tolerance: Patient limited by lethargy Patient left: in chair;with call bell/phone within reach;with chair alarm set;with family/visitor present Nurse Communication: Mobility status;Patient requests pain meds PT Visit Diagnosis: Muscle weakness (generalized) (M62.81);Pain Pain - Right/Left: Right Pain - part of body: Leg    Time: 0926-0958 PT Time Calculation (min) (ACUTE ONLY): 32 min   Charges:   PT Evaluation $PT Eval Moderate Complexity: 1 Mod PT Treatments $Therapeutic Exercise: 8-22 mins PT General Charges $$ ACUTE PT VISIT: 1 Visit         Lewis Shock, PT, DPT Acute Rehabilitation Services Secure chat preferred Office #: [phone]   Iona Hansen 05/22/2023, 12:18 PM

## 2023-05-22 NOTE — Progress Notes (Signed)
Progress Note  Patient Name: Angela Lucero Date of Encounter: 05/22/2023  Primary Cardiologist: Gypsy Balsam, MD   Subjective   "I'm tired!" Breathing ok at present. No pain.  Inpatient Medications    Scheduled Meds:  apixaban  10 mg Oral BID   Followed by   Melene Muller ON 05/28/2023] apixaban  5 mg Oral BID   aspirin EC  81 mg Oral Daily   atorvastatin  80 mg Oral Daily   busPIRone  10 mg Oral TID   carvedilol  6.25 mg Oral BID WC   cholecalciferol  10,000 Units Oral Q0600   empagliflozin  10 mg Oral Daily   ferrous sulfate  325 mg Oral TID PC   furosemide  20 mg Intravenous Once   furosemide  60 mg Intravenous BID   insulin aspart  0-15 Units Subcutaneous TID WC   melatonin  5 mg Oral QHS   multivitamin with minerals   Oral Daily   pantoprazole  40 mg Oral Daily   polyethylene glycol  34 g Oral Daily   sodium chloride flush  3 mL Intravenous Q12H   umeclidinium-vilanterol  1 puff Inhalation Daily   Continuous Infusions:  sodium chloride     [START ON 05/23/2023] cefTRIAXone (ROCEPHIN)  IV     PRN Meds: sodium chloride, acetaminophen **OR** acetaminophen, albuterol, HYDROcodone-acetaminophen, nitroGLYCERIN, ondansetron **OR** ondansetron (ZOFRAN) IV, sodium chloride flush   Vital Signs    Vitals:   05/21/23 2353 05/22/23 0408 05/22/23 0740 05/22/23 0823  BP: 118/71 129/75 113/62   Pulse: 76 82 76 94  Resp: 16 (!) 24 (!) 22 (!) 21  Temp: 97.6 F (36.4 C) 98 F (36.7 C) 97.9 F (36.6 C)   TempSrc: Oral Axillary Oral   SpO2: 98% 97% 93% 94%  Weight:  75.3 kg    Height:        Intake/Output Summary (Last 24 hours) at 05/22/2023 1133 Last data filed at 05/22/2023 0900 Gross per 24 hour  Intake 542.3 ml  Output 951 ml  Net -408.7 ml   Filed Weights   05/20/23 1214 05/21/23 0444 05/22/23 0408  Weight: 74.8 kg 76.9 kg 75.3 kg    Telemetry    Sinus rhythm - Personally Reviewed  ECG    Sinus rhythm RBBB, LAFB - Personally Reviewed  Physical Exam    Deferred.  Labs    Chemistry Recent Labs  Lab 05/20/23 1230 05/20/23 1238 05/21/23 0311 05/22/23 0929  NA 136 134* 132* 131*  K 4.5 4.5 3.7 4.1  CL 93* 95* 92* 94*  CO2 29  --  30 27  GLUCOSE 176* 180* 184* 188*  BUN 70* 63* 65* 62*  CREATININE 2.29* 2.10* 1.94* 1.84*  CALCIUM 8.7*  --  8.5* 8.3*  PROT 6.3*  --   --   --   ALBUMIN 2.7*  --   --   --   AST 17  --   --   --   ALT 12  --   --   --   ALKPHOS 62  --   --   --   BILITOT 0.5  --   --   --   GFRNONAA 21*  --  26* 27*  ANIONGAP 14  --  10 10     Hematology Recent Labs  Lab 05/20/23 1230 05/20/23 1238 05/21/23 0311  WBC 9.0  --  9.0  RBC 3.47*  --  3.47*  HGB 9.6* 10.5* 9.6*  HCT 31.9* 31.0* 31.9*  MCV 91.9  --  91.9  MCH 27.7  --  27.7  MCHC 30.1  --  30.1  RDW 19.5*  --  19.7*  PLT 335  --  290    Cardiac EnzymesNo results for input(s): "TROPONINI" in the last 168 hours. No results for input(s): "TROPIPOC" in the last 168 hours.   BNP Recent Labs  Lab 05/20/23 1230  BNP 2,985.3*     DDimer No results for input(s): "DDIMER" in the last 168 hours.   Summary of Pertinent studies    TTE: Limited TTE this admission - EF unchanged 20-25%, moderate MR  Cardiac cath: Last 11/2021 CTO of mid LAD with right to left and left to left collaterals.  Moderate circumflex and RCA disease.  Patent distal RCA stent with mid in-stent restenosis  Imaging: CT pulmonary angiogram September 20: Small volume nonocclusive PE in the bilateral lower lobe distal segmental and proximal subsegmental pulmonary artery  Labs: Creatinine 1.94, improved from 2.29; sodium downtrending from 1 36-1 32.  Patient Profile     82 y.o. female with a hx of  CAD s/p PTCA, COPD on home oxygen, HFrEF, history of CVA, type 2 DM, gout, HLD, HTN, CKD stage IIIb, severe MR,  admit earlier this month with hip fracture s/p surgery who is being seen 05/20/2023 for the evaluation of SOB at the request of Dr Joylene Igo.   Assessment & Plan     Acute on chronic CHF with reduced EF Echocardiogram September 2024 with EF 20-25, unchanged from August 2024. Continue empagliflozin, carvedilol 6.25 Gentle diuresis  Severe mitral regurgitation - now graded moderate this admit In setting of severe systolic dysfunction Continue to follow with diuresis as outpatient I do not anticipate further intervention this admission  Pulmonary emboli Provoked PE in the setting of recent orthopedic surgery and admission CTA without signs of RV strain Continue apixaban  AKI on CKD Creatinine 2.29 on admission, baseline 2 weeks ago 1.3-1.5 Patient received dye load for CT PE  History of coronary disease Stable, no angina Continue aspirin, carvedilol, atorvastatin   For questions or updates, please contact [facility] Please consult [email or web address] for contact info under Cardiology/STEMI.      Signed, Maurice Small, MD 05/22/2023, 11:33 AM

## 2023-05-23 DIAGNOSIS — Z66 Do not resuscitate: Secondary | ICD-10-CM

## 2023-05-23 DIAGNOSIS — N179 Acute kidney failure, unspecified: Secondary | ICD-10-CM

## 2023-05-23 DIAGNOSIS — I5023 Acute on chronic systolic (congestive) heart failure: Secondary | ICD-10-CM | POA: Diagnosis not present

## 2023-05-23 DIAGNOSIS — Z96641 Presence of right artificial hip joint: Secondary | ICD-10-CM | POA: Diagnosis not present

## 2023-05-23 DIAGNOSIS — Z515 Encounter for palliative care: Secondary | ICD-10-CM

## 2023-05-23 DIAGNOSIS — I5021 Acute systolic (congestive) heart failure: Secondary | ICD-10-CM

## 2023-05-23 DIAGNOSIS — I2699 Other pulmonary embolism without acute cor pulmonale: Secondary | ICD-10-CM | POA: Diagnosis not present

## 2023-05-23 LAB — GLUCOSE, CAPILLARY
Glucose-Capillary: 134 mg/dL — ABNORMAL HIGH (ref 70–99)
Glucose-Capillary: 116 mg/dL — ABNORMAL HIGH (ref 70–99)
Glucose-Capillary: 121 mg/dL — ABNORMAL HIGH (ref 70–99)
Glucose-Capillary: 198 mg/dL — ABNORMAL HIGH (ref 70–99)
Glucose-Capillary: 121 mg/dL — ABNORMAL HIGH (ref 70–99)

## 2023-05-23 LAB — BASIC METABOLIC PANEL
Anion gap: 9 (ref 5–15)
BUN: 68 mg/dL — ABNORMAL HIGH (ref 8–23)
CO2: 31 mmol/L (ref 22–32)
Calcium: 8.8 mg/dL — ABNORMAL LOW (ref 8.9–10.3)
Chloride: 92 mmol/L — ABNORMAL LOW (ref 98–111)
Creatinine, Ser: 1.96 mg/dL — ABNORMAL HIGH (ref 0.44–1.00)
GFR, Estimated: 25 mL/min — ABNORMAL LOW (ref >60)
Glucose, Bld: 127 mg/dL — ABNORMAL HIGH (ref 70–99)
Potassium: 4.9 mmol/L (ref 3.5–5.1)
Sodium: 132 mmol/L — ABNORMAL LOW (ref 135–145)

## 2023-05-23 LAB — MAGNESIUM: Magnesium: 2.9 mg/dL — ABNORMAL HIGH (ref 1.7–2.4)

## 2023-05-23 MED ORDER — HYDRALAZINE HCL 10 MG PO TABS
10.0000 mg | ORAL_TABLET | Freq: Three times a day (TID) | ORAL | Status: DC
  Administered 2023-05-23 – 2023-05-24 (×3): 10 mg via ORAL
  Filled 2023-05-23 (×3): qty 1

## 2023-05-23 MED ORDER — ISOSORBIDE MONONITRATE ER 30 MG PO TB24
15.0000 mg | ORAL_TABLET | Freq: Every day | ORAL | Status: DC
  Administered 2023-05-23 – 2023-05-24 (×2): 15 mg via ORAL
  Filled 2023-05-23 (×2): qty 1

## 2023-05-23 MED ORDER — FOSFOMYCIN TROMETHAMINE 3 G PO PACK
3.0000 g | PACK | Freq: Once | ORAL | Status: AC
  Administered 2023-05-23: 3 g via ORAL
  Filled 2023-05-23: qty 3

## 2023-05-23 MED ORDER — TORSEMIDE 20 MG PO TABS
40.0000 mg | ORAL_TABLET | Freq: Two times a day (BID) | ORAL | Status: DC
  Administered 2023-05-23 – 2023-05-24 (×3): 40 mg via ORAL
  Filled 2023-05-23 (×3): qty 2

## 2023-05-23 NOTE — Care Management Important Message (Signed)
Important Message  Patient Details  Name: Angela Lucero MRN: [id number] Date of Birth: 1940/10/08   Important Message Given:  Yes - Medicare IM     Dorena Bodo 05/23/2023, 3:41 PM

## 2023-05-23 NOTE — Evaluation (Signed)
Occupational Therapy Evaluation Patient Details Name: Angela Lucero MRN: [id number] DOB: August 10, 1941 Today's Date: 05/23/2023   History of Present Illness Pt is a 82yo female who came from [facility] with acute pulmonary embolism and heart failure exacerbation. PSH: s/p R hemiarthroplasyy on 9/3 by Dr. Dion Saucier  PMH: CHF with last known LVEF 20-25%, COPD, DM, stage IIIa CKD, CAD, on 2-3L of O2 at home   Clinical Impression   Patient admitted for the diagnosis above.  PTA she was undergoing short term rehab at a local SNF post THA.  Patient currently needing up to Mod A and increased time to perform ADL, and Min A for basic mobility/transfers.  OT is indicated in the acute setting to address deficits listed below, and Patient will benefit from continued inpatient follow up therapy, <3 hours/day, prior to returning home alone.          If plan is discharge home, recommend the following: A little help with walking and/or transfers;A lot of help with bathing/dressing/bathroom;Assist for transportation;Assistance with cooking/housework;Help with stairs or ramp for entrance    Functional Status Assessment  Patient has had a recent decline in their functional status and demonstrates the ability to make significant improvements in function in a reasonable and predictable amount of time.  Equipment Recommendations  None recommended by OT    Recommendations for Other Services       Precautions / Restrictions Precautions Precautions: Posterior Hip Precaution Booklet Issued: No Restrictions Weight Bearing Restrictions: Yes RLE Weight Bearing: Weight bearing as tolerated      Mobility Bed Mobility Overal bed mobility: Needs Assistance Bed Mobility: Supine to Sit, Sit to Supine     Supine to sit: Supervision Sit to supine: Supervision     Patient Response: Cooperative  Transfers Overall transfer level: Needs assistance Equipment used: Rolling walker (2 wheels) Transfers: Sit to/from  Stand, Bed to chair/wheelchair/BSC Sit to Stand: Min assist     Step pivot transfers: Min assist            Balance Overall balance assessment: Needs assistance Sitting-balance support: Bilateral upper extremity supported, Feet unsupported, Feet supported, Single extremity supported Sitting balance-Leahy Scale: Good     Standing balance support: Bilateral upper extremity supported, Reliant on assistive device for balance Standing balance-Leahy Scale: Poor                             ADL either performed or assessed with clinical judgement   ADL Overall ADL's : Needs assistance/impaired Eating/Feeding: Independent;Bed level   Grooming: Wash/dry hands;Wash/dry face;Set up;Sitting   Upper Body Bathing: Contact guard assist;Sitting   Lower Body Bathing: Moderate assistance;Sit to/from stand   Upper Body Dressing : Minimal assistance;Sitting   Lower Body Dressing: Moderate assistance;Sit to/from stand   Toilet Transfer: Minimal assistance;Stand-pivot                   Vision Baseline Vision/History: 1 Wears glasses Patient Visual Report: No change from baseline       Perception Perception: Within Functional Limits       Praxis Praxis: WFL       Pertinent Vitals/Pain Pain Assessment Pain Assessment: Faces Faces Pain Scale: Hurts a little bit Pain Location: R hip pain Pain Descriptors / Indicators: Aching Pain Intervention(s): Monitored during session     Extremity/Trunk Assessment Upper Extremity Assessment Upper Extremity Assessment: Overall WFL for tasks assessed   Lower Extremity Assessment Lower Extremity Assessment: Defer to PT  evaluation   Cervical / Trunk Assessment Cervical / Trunk Assessment: Normal   Communication Communication Communication: No apparent difficulties   Cognition Arousal: Alert Behavior During Therapy: WFL for tasks assessed/performed Overall Cognitive Status: Within Functional Limits for tasks assessed                                                         Home Living Family/patient expects to be discharged to:: Skilled nursing facility Living Arrangements: Alone Available Help at Discharge: Family;Friend(s);Available PRN/intermittently Type of Home: Mobile home Home Access: Stairs to enter Entrance Stairs-Number of Steps: 3 Entrance Stairs-Rails: Left Home Layout: One level     Bathroom Shower/Tub: Tub/shower unit;Walk-in shower   Bathroom Toilet: Standard Bathroom Accessibility: Yes How Accessible: Accessible via walker Home Equipment: Cane - single point;Rolling Walker (2 wheels);Shower seat;Grab bars - tub/shower   Additional Comments: pt was previously living alone but was d/c'd to [facility] on 9/9 following hospitalization from undergoing R THA      Prior Functioning/Environment Prior Level of Function : Needs assist               ADLs Comments: assist from staff at SNF        OT Problem List: Decreased activity tolerance;Impaired balance (sitting and/or standing)      OT Treatment/Interventions: Self-care/ADL training;Therapeutic activities;Patient/family education;Balance training;DME and/or AE instruction    OT Goals(Current goals can be found in the care plan section) Acute Rehab OT Goals Patient Stated Goal: continue rehab and return home OT Goal Formulation: With patient Time For Goal Achievement: 06/06/23 Potential to Achieve Goals: Good ADL Goals Pt Will Perform Grooming: with supervision;standing Pt Will Perform Upper Body Dressing: with set-up;sitting Pt Will Perform Lower Body Dressing: with min assist;sit to/from stand Pt Will Transfer to Toilet: with contact guard assist;ambulating;regular height toilet  OT Frequency: Min 1X/week    Co-evaluation              AM-PAC OT "6 Clicks" Daily Activity     Outcome Measure Help from another person eating meals?: None Help from another person taking care of  personal grooming?: A Little Help from another person toileting, which includes using toliet, bedpan, or urinal?: A Lot Help from another person bathing (including washing, rinsing, drying)?: A Lot Help from another person to put on and taking off regular upper body clothing?: A Little Help from another person to put on and taking off regular lower body clothing?: A Lot 6 Click Score: 16   End of Session Equipment Utilized During Treatment: Rolling walker (2 wheels);Oxygen Nurse Communication: Mobility status  Activity Tolerance: Patient limited by fatigue Patient left: in bed;with call bell/phone within reach  OT Visit Diagnosis: Unsteadiness on feet (R26.81);Muscle weakness (generalized) (M62.81)                Time: 7253-6644 OT Time Calculation (min): 18 min Charges:  OT General Charges $OT Visit: 1 Visit OT Evaluation $OT Eval Moderate Complexity: 1 Mod  05/23/2023  RP, OTR/L  Acute Rehabilitation Services  Office:  [phone]   Suzanna Obey 05/23/2023, 8:56 AM

## 2023-05-23 NOTE — Consult Note (Signed)
Consultation Note Date: 05/23/2023   Patient Name: Angela Lucero  DOB: 01-Mar-1941  MRN: [id number]  Age / Sex: 82 y.o., female  PCP: [name], MD Referring Physician: Coralie Keens  Reason for Consultation: Establishing goals of care  HPI/Patient Profile: 82 y.o. female   admitted on 05/20/2023 with past medical history of heart failure, coronary artery disease, COPD, CKD, T2DM and chronic hypoxia who presented with dyspnea.   Recent hospitalization 09/01 to 05/09/23 closed right femoral neck fracture, sp left hemiarthroplasty on 09/03. Hospitalization complicated with acute heart failure decompensation. She was discharge to SNF.   While in the SNF she had chest pain and dyspnea. Because intensity of chest pain she was transported to the ED. On her initial physical examination her blood pressure was 104/59, HR 72, RR 27 and 02 saturation 96%, lungs with rales bilaterally, heart with S1 and S2 present and regular, abdomen with tenderness in the suprapubic region, positive lower extremity edema, more left than right.   Patient and family face treatment option decisions, advanced directive decisions and anticipatory care needs.    Clinical Assessment and Goals of Care:  This NP Lorinda Creed reviewed medical records, received report from team, assessed the patient and then meet at the patient's bedside  to discuss diagnosis, prognosis, GOC, EOL wishes disposition and options.   Concept of Palliative Care was introduced as specialized medical care for people and their families living with serious illness.  If focuses on providing relief from the symptoms and stress of a serious illness.  The goal is to improve quality of life for both the patient and the family.  Values and goals of care important to patient and family were attempted to be elicited.  Created space and opportunity for patient  to explore  thoughts and feelings regarding current medical situation.  Patient is alert and oriented x 3.  Patient understands she has multiple medical comorbidities which has been greatly compounded by her hip fracture.Marland Kitchen  Ultimately her hope is to continue rehabilitation, gain strength and ability to return to her home living independently.  She has neighbors who are a great help and her son who is her main support person lives 15 minutes from her     A  discussion was had today regarding advanced directives.  Concepts specific to code status, artifical feeding and hydration, continued IV antibiotics and rehospitalization was had.    The difference between a aggressive medical intervention path  and a palliative comfort care path for this patient at this time was had.     MOST form introduced and left for review.  Hard Choices booklet left for review        With patient's permission I spoke to her son/Angela Lucero.  I reviewed above concepts with him, questions and concerns addressed.  He understands that his mother has a long road to possible recovery.  He is pragmatic about her advanced age and possible continued physical and functional decline.  Plan is when medically stable back to SNF for  rehab.   Son understands the likely continued decline and is investigating assisted living options  Son tells me he is in process of securing H POA and living will     Patient  encouraged to call with questions or concerns.     PMT will continue to support holistically.   Patient's son Angela Lucero is patient's main support person.  She tells me he would be her decision-maker in the event that she could not speak for herself.  When speaking with him by telephone today he tells me he is in process of securing H POA and DURABLE POWER OF ATTORNEY.  Encouraged him to provide copies for electronic medical records  SUMMARY OF RECOMMENDATIONS    Code Status/Advance Care Planning: DNR   Palliative Prophylaxis:   Delirium Protocol and Frequent Pain Assessment  Additional Recommendations (Limitations, Scope, Preferences): Full Scope Treatment  Psycho-social/Spiritual:  Desire for further Chaplaincy support:no Additional Recommendations: Education on Hospice  Prognosis:  Unable to determine  Discharge Planning: Skilled Nursing Facility for rehab with Palliative care service follow-up      Primary Diagnoses: Present on Admission:  Acute pulmonary embolism (HCC)  Acute on chronic systolic CHF (congestive heart failure) (HCC)  AKI (acute kidney injury) (HCC)  UTI (urinary tract infection)  History of hemiarthroplasty of right hip  COPD (chronic obstructive pulmonary disease) (HCC)   I have reviewed the medical record, interviewed the patient and family, and examined the patient. The following aspects are pertinent.  Past Medical History:  Diagnosis Date   Acute exacerbation of CHF (congestive heart failure) (HCC) 12/10/2021   Acute hypoxic respiratory failure (HCC) 02/04/2023   Acute on chronic HFrEF (heart failure with reduced ejection fraction) (HCC) 02/05/2023   Acute on chronic hypoxic respiratory failure (HCC) 02/05/2023   Acute systolic heart failure (HCC) 12/14/2021   AKI (acute kidney injury) (HCC) 02/06/2023   Anemia 02/13/2023   Anxiety disorder, unspecified 03/16/2023   Arthritis 2010   Lower back and neck   Arthritis of both feet 02/01/2018   CAD (coronary artery disease)    Cardiomyopathy (HCC) 01/01/2022   Chronic edema 12/18/2021   Bilateral lower extremities         Chronic hypoxic respiratory failure (HCC) 05/01/2023   Chronic kidney disease (CKD), stage III (moderate) (HCC) 05/01/2023   Closed right hip fracture, initial encounter (HCC) 05/01/2023   Cognitive communication deficit 03/16/2023   Congestive heart failure (CHF) (HCC) 12/10/2021   COPD (chronic obstructive pulmonary disease) (HCC)    COPD exacerbation (HCC) 02/05/2023   Coping style affecting  medical condition 02/23/2023   Coronary artery disease PTCA and stenting in 1997 and 2005 in [place] 12/10/2021   CVA (cerebral vascular accident) (HCC)    1997, 2005 stents   Debility 02/10/2023   Diabetes (HCC)    Diabetes mellitus, type II (HCC)    Displaced fracture of right femoral neck (HCC) 05/01/2023   Dyslipidemia 12/10/2021   Dyslipidemia associated with type 2 diabetes mellitus (HCC) 02/01/2018   Dysphagia, oropharyngeal phase 03/16/2023   Elevated troponin 02/04/2023   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  rigid, progressive flat foot deformity of BOTH feet     GAD (generalized anxiety disorder) 03/16/2023   Gout 1998   Heart attack (HCC)    0865,7846   History of CAD (coronary artery disease) 05/01/2023   Hyperglycemia, unspecified 03/16/2023   Hyperlipidemia    Hypertension    Hypertriglyceridemia    Hyponatremia  02/04/2023   Melanotic stools 02/13/2023   Muscle wasting and atrophy, not elsewhere classified, multiple sites 03/16/2023   Nicotine dependence, cigarettes, uncomplicated 03/16/2023   Non-ST elevation (NSTEMI) myocardial infarction (HCC) 12/14/2021   Nonrheumatic mitral (valve) insufficiency 03/16/2023   Other malaise 03/16/2023   Other nonthrombocytopenic purpura (HCC) 03/16/2023   Pneumonia, unspecified organism 03/16/2023   Preop cardiovascular exam 12/10/2021   Smoking 12/10/2021   Type 2 diabetes mellitus with complication, without long-term current use of insulin (HCC) 02/01/2018   Social History   Socioeconomic History   Marital status: Widowed    Spouse name: Not on file   Number of children: Not on file   Years of education: Not on file   Highest education level: Not on file  Occupational History   Not on file  Tobacco Use   Smoking status: Former    Current packs/day: 0.00    Types: Cigarettes    Quit date: 01/2023    Years since quitting: 0.3   Smokeless tobacco: Never  Vaping  Use   Vaping status: Never Used  Substance and Sexual Activity   Alcohol use: Not Currently   Drug use: Never   Sexual activity: Not Currently  Other Topics Concern   Not on file  Social History Narrative   Not on file   Social Determinants of Health   Financial Resource Strain: Low Risk  (02/10/2023)   Overall Financial Resource Strain (CARDIA)    Difficulty of Paying Living Expenses: Not very hard  Food Insecurity: No Food Insecurity (05/20/2023)   Hunger Vital Sign    Worried About Running Out of Food in the Last Year: Never true    Ran Out of Food in the Last Year: Never true  Transportation Needs: No Transportation Needs (05/20/2023)   PRAPARE - Administrator, Civil Service (Medical): No    Lack of Transportation (Non-Medical): No  Physical Activity: Not on file  Stress: Not on file  Social Connections: Not on file   Family History  Problem Relation Age of Onset   Heart disease Mother    Hypertension Mother    Heart disease Father    Hypertension Father    Cancer Brother    Scheduled Meds:  apixaban  10 mg Oral BID   Followed by   Melene Muller ON 05/28/2023] apixaban  5 mg Oral BID   aspirin EC  81 mg Oral Daily   atorvastatin  80 mg Oral Daily   busPIRone  10 mg Oral TID   carvedilol  6.25 mg Oral BID WC   cholecalciferol  10,000 Units Oral Q0600   empagliflozin  10 mg Oral Daily   ferrous sulfate  325 mg Oral TID PC   hydrALAZINE  10 mg Oral Q8H   insulin aspart  0-15 Units Subcutaneous TID WC   isosorbide mononitrate  15 mg Oral Daily   melatonin  5 mg Oral QHS   multivitamin with minerals   Oral Daily   pantoprazole  40 mg Oral Daily   polyethylene glycol  34 g Oral Daily   sodium chloride flush  3 mL Intravenous Q12H   torsemide  40 mg Oral BID   umeclidinium-vilanterol  1 puff Inhalation Daily   Continuous Infusions:  sodium chloride     cefTRIAXone (ROCEPHIN)  IV Stopped (05/22/23 1310)   PRN Meds:.sodium chloride, acetaminophen **OR**  acetaminophen, albuterol, HYDROcodone-acetaminophen, nitroGLYCERIN, ondansetron **OR** ondansetron (ZOFRAN) IV, sodium chloride flush Medications Prior to Admission:  Prior to Admission medications  Medication Sig Start Date End Date Taking? Authorizing Provider  acetaminophen (TYLENOL) 500 MG tablet Take 500 mg by mouth every 6 (six) hours as needed for mild pain.   Yes [provider]  albuterol (PROVENTIL) (2.5 MG/3ML) 0.083% nebulizer solution Take 2.5 mg by nebulization every 6 (six) hours as needed for wheezing or shortness of breath.   Yes [provider]  albuterol (VENTOLIN HFA) 108 (90 Base) MCG/ACT inhaler Inhale 2 puffs into the lungs every 6 (six) hours as needed for wheezing or shortness of breath. 04/26/23  Yes [name], MD  aspirin EC 81 MG tablet Take 81 mg by mouth daily. Swallow whole.   Yes [provider]  atorvastatin (LIPITOR) 80 MG tablet TAKE 1 TABLET BY MOUTH EVERY DAY 05/19/23  Yes [name], MD  busPIRone (BUSPAR) 10 MG tablet TAKE 1 TABLET BY MOUTH THREE TIMES A DAY 05/19/23  Yes [name], MD  carvedilol (COREG) 6.25 MG tablet Take 1 tablet (6.25 mg total) by mouth 2 (two) times daily with a meal. 05/09/23 06/08/23 Yes Ghimire, Lyndel Safe, MD  Cetirizine-Pseudoephedrine (ALLERGY RELIEF D PO) Take 1 tablet by mouth daily.   Yes [provider]  Cholecalciferol (VITAMIN D3) 250 MCG (10000 UT) TABS Take 10,000 Units by mouth daily at 6 (six) AM. 05/09/23  Yes Ghimire, Lyndel Safe, MD  ciprofloxacin (CIPRO) 500 MG tablet Take 500 mg by mouth 2 (two) times daily.   Yes [provider]  clonazePAM (KLONOPIN) 0.5 MG tablet Take 1 tablet (0.5 mg total) by mouth 2 (two) times daily as needed for up to 5 days for anxiety. Patient taking differently: Take 0.5 mg by mouth every 8 (eight) hours as needed for anxiety. 05/09/23 05/20/23 Yes Dorcas Carrow, MD  docusate sodium (COLACE) 100 MG capsule Take 1 capsule (100 mg total) by mouth 2 (two) times daily.  05/09/23  Yes Dorcas Carrow, MD  empagliflozin (JARDIANCE) 25 MG TABS tablet Take 1 tablet (25 mg total) by mouth daily. 04/26/23  Yes [name], MD  enoxaparin (LOVENOX) 40 MG/0.4ML injection Inject 0.3 mLs (30 mg total) into the skin daily for 25 days. For DVT prophylaxis after surgery 05/09/23 06/03/23 Yes Dorcas Carrow, MD  ferrous sulfate 325 (65 FE) MG tablet Take 1 tablet (325 mg total) by mouth 3 (three) times daily after meals. 05/09/23  Yes Dorcas Carrow, MD  HYDROcodone-acetaminophen (NORCO) 5-325 MG tablet Take 1-2 tablets by mouth every 4 (four) hours as needed for severe pain or moderate pain. 1 tablets for moderate pain, 2 tablet for severe pain 05/09/23  Yes Dorcas Carrow, MD  magnesium oxide (MAG-OX) 400 (240 Mg) MG tablet Take 1 tablet (400 mg total) by mouth daily. 04/28/23  Yes [name], MD  melatonin 5 MG TABS Take 1 tablet (5 mg total) by mouth at bedtime. 02/24/23  Yes Angiulli, Mcarthur Rossetti, PA-C  Multiple Vitamin (MULTIVITAMIN ADULT PO) Take 1 tablet by mouth daily.   Yes [provider]  nitroGLYCERIN (NITROSTAT) 0.4 MG SL tablet Place 1 tablet (0.4 mg total) under the tongue every 5 (five) minutes as needed for chest pain. 02/24/23  Yes Angiulli, Mcarthur Rossetti, PA-C  Omega 3 1000 MG CAPS Take 2 capsules by mouth in the morning and at bedtime.   Yes [provider]  pantoprazole (PROTONIX) 40 MG tablet Take 1 tablet (40 mg total) by mouth daily. 04/26/23  Yes [name], MD  polyethylene glycol (MIRALAX / GLYCOLAX) 17 g packet Take 34 g by mouth daily. 05/09/23  Yes Dorcas Carrow, MD  spironolactone (ALDACTONE) 50 MG tablet Take 1 tablet (50 mg total) by mouth daily. 04/26/23  Yes [name], MD  torsemide (DEMADEX) 20 MG tablet Take 3 tablets (60 mg total) by mouth 2 (two) times daily. 04/26/23  Yes [name], MD  umeclidinium-vilanterol [facility] ELLIPTA) 62.5-25 MCG/ACT AEPB Inhale 1 puff into the lungs daily. 04/26/23  Yes [name], MD   No Known Allergies Review of  Systems  Neurological:  Positive for weakness.    Physical Exam Cardiovascular:     Rate and Rhythm: Normal rate.  Pulmonary:     Effort: Pulmonary effort is normal.  Musculoskeletal:     Comments: Generalized weakness  Skin:    General: Skin is warm and dry.  Neurological:     Mental Status: She is alert and oriented to person, place, and time.     Vital Signs: BP 109/62 (BP Location: Left Arm)   Pulse 70   Temp 97.8 F (36.6 C) (Oral)   Resp 15   Ht 5\' 6"  (1.676 m)   Wt 75.4 kg   SpO2 99%   BMI 26.83 kg/m  Pain Scale: 0-10   Pain Score: 6    SpO2: SpO2: 99 % O2 Device:SpO2: 99 % O2 Flow Rate: .O2 Flow Rate (L/min): 4 L/min  IO: Intake/output summary:  Intake/Output Summary (Last 24 hours) at 05/23/2023 1024 Last data filed at 05/23/2023 0425 Gross per 24 hour  Intake 338.78 ml  Output 1200 ml  Net -861.22 ml    LBM: Last BM Date : 05/22/23 Baseline Weight: Weight: 74.8 kg Most recent weight: Weight: 75.4 kg     Palliative Assessment/Data: 40%     Time:   75 minutes   Discussed with Dr. Ella Jubilee  Signed by: Lorinda Creed, NP   Please contact Palliative Medicine Team phone at [phone] for questions and concerns.  For individual provider: See Loretha Stapler

## 2023-05-23 NOTE — Progress Notes (Signed)
Physical Therapy Treatment Patient Details Name: Angela Lucero MRN: [id number] DOB: 10/18/1940 Today's Date: 05/23/2023   History of Present Illness Pt is a 82yo female who came from [facility] with acute pulmonary embolism and heart failure exacerbation. PSH: s/p R hemiarthroplasyy on 9/3 by Dr. Dion Saucier  PMH: CHF with last known LVEF 20-25%, COPD, DM, stage IIIa CKD, CAD, on 2-3L of O2 at home    PT Comments  Patient received in bed, she is agreeable to PT session. Patient is somewhat anxious regarding mobility. Having loose stool, requiring pivot to [facility] prior to ambulation. Then patient ambulated about 3 feet and needed to sit down due to fatigue. Overall she requires min A for mobility. She is limited by respiratory status and fatigue. Patient will continue to benefit from skilled PT to improve strength, endurance and independence.      If plan is discharge home, recommend the following: A lot of help with walking and/or transfers;A lot of help with bathing/dressing/bathroom   Can travel by private vehicle     No  Equipment Recommendations  Other (comment) (TBD)    Recommendations for Other Services       Precautions / Restrictions Precautions Precautions: Posterior Hip;Fall Precaution Booklet Issued: No Precaution Comments: pt able to recall when asked Restrictions Weight Bearing Restrictions: Yes RLE Weight Bearing: Weight bearing as tolerated     Mobility  Bed Mobility Overal bed mobility: Needs Assistance Bed Mobility: Supine to Sit, Sit to Supine     Supine to sit: Min assist Sit to supine: Min assist, Used rails   General bed mobility comments: Min A to bring LEs on/off bed. Use of bed rails    Transfers Overall transfer level: Needs assistance Equipment used: Rolling walker (2 wheels) Transfers: Sit to/from Stand Sit to Stand: Min assist   Step pivot transfers: Min assist       General transfer comment: Min A for safety    Ambulation/Gait Ambulation/Gait  assistance: Min Chemical engineer (Feet): 3 Feet Assistive device: Rolling walker (2 wheels) Gait Pattern/deviations: Step-to pattern, Decreased step length - right, Decreased step length - left, Decreased stride length, Trunk flexed Gait velocity: decr     General Gait Details: patient ambualted about 3 feet and then reports she is too weak to continue. Required chair to be pulled up behind her.   Stairs             Wheelchair Mobility     Tilt Bed    Modified Rankin (Stroke Patients Only)       Balance Overall balance assessment: Needs assistance Sitting-balance support: Bilateral upper extremity supported, Feet supported Sitting balance-Leahy Scale: Good     Standing balance support: Bilateral upper extremity supported, During functional activity, Reliant on assistive device for balance Standing balance-Leahy Scale: Fair Standing balance comment: dependent on RW and UEs to offweight LEs                            Cognition Arousal: Alert Behavior During Therapy: WFL for tasks assessed/performed Overall Cognitive Status: Within Functional Limits for tasks assessed                                          Exercises      General Comments        Pertinent Vitals/Pain Pain Assessment Faces Pain Scale:  Hurts a little bit Pain Location: R hip pain Pain Descriptors / Indicators: Discomfort Pain Intervention(s): Premedicated before session, Repositioned    Home Living                          Prior Function            PT Goals (current goals can now be found in the care plan section) Acute Rehab PT Goals Patient Stated Goal: return to rehab PT Goal Formulation: With patient Time For Goal Achievement: 06/06/23 Potential to Achieve Goals: Good Progress towards PT goals: Progressing toward goals    Frequency    Min 1X/week      PT Plan      Co-evaluation              AM-PAC PT "6 Clicks"  Mobility   Outcome Measure  Help needed turning from your back to your side while in a flat bed without using bedrails?: A Little Help needed moving from lying on your back to sitting on the side of a flat bed without using bedrails?: A Little Help needed moving to and from a bed to a chair (including a wheelchair)?: A Little Help needed standing up from a chair using your arms (e.g., wheelchair or bedside chair)?: A Little Help needed to walk in hospital room?: A Lot Help needed climbing 3-5 steps with a railing? : Total 6 Click Score: 15    End of Session Equipment Utilized During Treatment: Gait belt;Oxygen Activity Tolerance: Patient limited by fatigue Patient left: in bed;with call bell/phone within reach;with bed alarm set Nurse Communication: Mobility status PT Visit Diagnosis: Muscle weakness (generalized) (M62.81);Difficulty in walking, not elsewhere classified (R26.2) Pain - Right/Left: Right Pain - part of body: Leg     Time: 8469-6295 PT Time Calculation (min) (ACUTE ONLY): 21 min  Charges:    $Therapeutic Activity: 8-22 mins PT General Charges $$ ACUTE PT VISIT: 1 Visit                     Angela Lucero, PT, GCS 05/23/23,3:25 PM

## 2023-05-23 NOTE — Progress Notes (Signed)
CSW was informed pt was admitted from [facility] and rehab. CSW called [facility] and they confirmed pt is STR and can return to her room upon dc.   TOC following.

## 2023-05-23 NOTE — Progress Notes (Signed)
Cardiology Progress Note  Patient ID: Angela Lucero MRN: [id number] DOB: November 06, 1940 Date of Encounter: 05/23/2023  Primary Cardiologist: Gypsy Balsam, MD  Subjective   Chief Complaint: SOB  HPI: Euvolemic on exam.  Transition to oral diuretics.  ROS:  All other ROS reviewed and negative. Pertinent positives noted in the HPI.     Inpatient Medications  Scheduled Meds:  apixaban  10 mg Oral BID   Followed by   Melene Muller ON 05/28/2023] apixaban  5 mg Oral BID   aspirin EC  81 mg Oral Daily   atorvastatin  80 mg Oral Daily   busPIRone  10 mg Oral TID   carvedilol  6.25 mg Oral BID WC   cholecalciferol  10,000 Units Oral Q0600   empagliflozin  10 mg Oral Daily   ferrous sulfate  325 mg Oral TID PC   insulin aspart  0-15 Units Subcutaneous TID WC   melatonin  5 mg Oral QHS   multivitamin with minerals   Oral Daily   pantoprazole  40 mg Oral Daily   polyethylene glycol  34 g Oral Daily   sodium chloride flush  3 mL Intravenous Q12H   torsemide  40 mg Oral BID   umeclidinium-vilanterol  1 puff Inhalation Daily   Continuous Infusions:  sodium chloride     cefTRIAXone (ROCEPHIN)  IV Stopped (05/22/23 1310)   PRN Meds: sodium chloride, acetaminophen **OR** acetaminophen, albuterol, HYDROcodone-acetaminophen, nitroGLYCERIN, ondansetron **OR** ondansetron (ZOFRAN) IV, sodium chloride flush   Vital Signs   Vitals:   05/22/23 1614 05/22/23 1929 05/22/23 2329 05/23/23 0423  BP:  109/63 120/65 109/62  Pulse: 76 72 80 70  Resp: 18 18 19 15   Temp:  (!) 97.4 F (36.3 C) 97.6 F (36.4 C) 97.8 F (36.6 C)  TempSrc:  Oral Oral Oral  SpO2: 98% 100% 95% 99%  Weight:    75.4 kg  Height:        Intake/Output Summary (Last 24 hours) at 05/23/2023 0950 Last data filed at 05/23/2023 0425 Gross per 24 hour  Intake 338.78 ml  Output 1200 ml  Net -861.22 ml      05/23/2023    4:23 AM 05/22/2023    4:08 AM 05/21/2023    4:44 AM  Last 3 Weights  Weight (lbs) 166 lb 3.6 oz 166 lb 0.1 oz  169 lb 8.5 oz  Weight (kg) 75.4 kg 75.3 kg 76.9 kg      Telemetry  Overnight telemetry shows sinus rhythm 80s, which I personally reviewed.    Physical Exam   Vitals:   05/22/23 1614 05/22/23 1929 05/22/23 2329 05/23/23 0423  BP:  109/63 120/65 109/62  Pulse: 76 72 80 70  Resp: 18 18 19 15   Temp:  (!) 97.4 F (36.3 C) 97.6 F (36.4 C) 97.8 F (36.6 C)  TempSrc:  Oral Oral Oral  SpO2: 98% 100% 95% 99%  Weight:    75.4 kg  Height:        Intake/Output Summary (Last 24 hours) at 05/23/2023 0950 Last data filed at 05/23/2023 0425 Gross per 24 hour  Intake 338.78 ml  Output 1200 ml  Net -861.22 ml       05/23/2023    4:23 AM 05/22/2023    4:08 AM 05/21/2023    4:44 AM  Last 3 Weights  Weight (lbs) 166 lb 3.6 oz 166 lb 0.1 oz 169 lb 8.5 oz  Weight (kg) 75.4 kg 75.3 kg 76.9 kg    Body mass index is  26.83 kg/m.  General: Well nourished, well developed, in no acute distress Head: Atraumatic, normal size  Eyes: PEERLA, EOMI  Neck: Supple, JVD 5 to 7 cm of water Endocrine: No thryomegaly Cardiac: Normal S1, S2; RRR; no murmurs, rubs, or gallops Lungs: Diminished breath sounds bilaterally Abd: Soft, nontender, no hepatomegaly  Ext: No edema, pulses 2+ Musculoskeletal: No deformities, BUE and BLE strength normal and equal Skin: Warm and dry, no rashes   Neuro: Alert and oriented to person, place, time, and situation, CNII-XII grossly intact, no focal deficits  Psych: Normal mood and affect   Labs  High Sensitivity Troponin:   Recent Labs  Lab 05/20/23 1230 05/20/23 1505  TROPONINIHS 26* 24*     Cardiac EnzymesNo results for input(s): "TROPONINI" in the last 168 hours. No results for input(s): "TROPIPOC" in the last 168 hours.  Chemistry Recent Labs  Lab 05/20/23 1230 05/20/23 1238 05/21/23 0311 05/22/23 0929 05/23/23 0303  NA 136   < > 132* 131* 132*  K 4.5   < > 3.7 4.1 4.9  CL 93*   < > 92* 94* 92*  CO2 29  --  30 27 31   GLUCOSE 176*   < > 184* 188* 127*   BUN 70*   < > 65* 62* 68*  CREATININE 2.29*   < > 1.94* 1.84* 1.96*  CALCIUM 8.7*  --  8.5* 8.3* 8.8*  PROT 6.3*  --   --   --   --   ALBUMIN 2.7*  --   --   --   --   AST 17  --   --   --   --   ALT 12  --   --   --   --   ALKPHOS 62  --   --   --   --   BILITOT 0.5  --   --   --   --   GFRNONAA 21*  --  26* 27* 25*  ANIONGAP 14  --  10 10 9    < > = values in this interval not displayed.    Hematology Recent Labs  Lab 05/20/23 1230 05/20/23 1238 05/21/23 0311  WBC 9.0  --  9.0  RBC 3.47*  --  3.47*  HGB 9.6* 10.5* 9.6*  HCT 31.9* 31.0* 31.9*  MCV 91.9  --  91.9  MCH 27.7  --  27.7  MCHC 30.1  --  30.1  RDW 19.5*  --  19.7*  PLT 335  --  290   BNP Recent Labs  Lab 05/20/23 1230  BNP 2,985.3*    DDimer No results for input(s): "DDIMER" in the last 168 hours.   Radiology  VAS [place] LOWER EXTREMITY VENOUS (DVT)  Result Date: 05/21/2023  Lower Venous DVT Study Patient Name:  Angela Lucero  Date of Exam:   05/21/2023 Medical Rec #: [id number]  Accession #:    [id number] Date of Birth: Oct 19, 1940  Patient Gender: F Patient Age:   5 years Exam Location[facility]  [facility] Procedure:      VAS [place] LOWER EXTREMITY VENOUS (DVT) Referring Phys: Elwyn Lade AGBATA --------------------------------------------------------------------------------  Indications: Edema, and pulmonary embolism. Other             Recent right hip fracture with repair 05/03/23. COPD on home Indications:      oxygen. Risk Factors: Chronic CHF with low EF. Comparison Study: No prior study on file Performing Technologist: Sherren Kerns RVS  Examination Guidelines: A complete evaluation includes B-mode  imaging, spectral Doppler, color Doppler, and power Doppler as needed of all accessible portions of each vessel. Bilateral testing is considered an integral part of a complete examination. Limited examinations for reoccurring indications may be performed as noted. The reflux portion of the exam is performed with the patient in  reverse Trendelenburg.  +--------+---------------+---------+-----------+----------------+-------------+ RIGHT   CompressibilityPhasicitySpontaneityProperties      Thrombus                                                                 Aging         +--------+---------------+---------+-----------+----------------+-------------+ CFV     Full                               pulsatile                                                                waveforms                     +--------+---------------+---------+-----------+----------------+-------------+ SFJ     Full                                                             +--------+---------------+---------+-----------+----------------+-------------+ FV Prox Full                                                             +--------+---------------+---------+-----------+----------------+-------------+ FV Mid  Full                                                             +--------+---------------+---------+-----------+----------------+-------------+ FV      Full                               pulsatile                     Distal                                     waveforms                     +--------+---------------+---------+-----------+----------------+-------------+ PFV     Full                                                             +--------+---------------+---------+-----------+----------------+-------------+  POP     Full                               pulsatile                                                                waveforms                     +--------+---------------+---------+-----------+----------------+-------------+ PTV     Full                                                             +--------+---------------+---------+-----------+----------------+-------------+ PERO    Full                                                              +--------+---------------+---------+-----------+----------------+-------------+   +--------+---------------+---------+-----------+----------------+-------------+ LEFT    CompressibilityPhasicitySpontaneityProperties      Thrombus                                                                 Aging         +--------+---------------+---------+-----------+----------------+-------------+ CFV     Full                               pulsatile                                                                waveforms                     +--------+---------------+---------+-----------+----------------+-------------+ SFJ     Full                                                             +--------+---------------+---------+-----------+----------------+-------------+ FV Prox Full                                                             +--------+---------------+---------+-----------+----------------+-------------+ FV Mid  Full                                                             +--------+---------------+---------+-----------+----------------+-------------+ FV      Full                               pulsatile                     Distal                                     waveforms                     +--------+---------------+---------+-----------+----------------+-------------+ PFV     Full                                                             +--------+---------------+---------+-----------+----------------+-------------+ POP     Full                               pulsatile                                                                waveforms                     +--------+---------------+---------+-----------+----------------+-------------+ PTV     Full                                                             +--------+---------------+---------+-----------+----------------+-------------+ PERO    Full                                                              +--------+---------------+---------+-----------+----------------+-------------+     Summary: RIGHT: - There is no evidence of deep vein thrombosis in the lower extremity.  - No cystic structure found in the popliteal fossa. pulsatile waveforms  LEFT: - There is no evidence of deep vein thrombosis in the lower extremity.  - No cystic structure found in the popliteal fossa. Pulsatile waveforms.  *See table(s) above for measurements and observations. Electronically signed by Coral Else MD on 05/21/2023 at 8:40:43 PM.    Final    ECHOCARDIOGRAM LIMITED  Result Date: 05/21/2023    ECHOCARDIOGRAM LIMITED REPORT  Patient Name:   Angela Lucero  Date of Exam: 05/21/2023 Medical Rec #:  [id number]  Height:       66.0 in Accession #:    [id number] Weight:       169.5 lb Date of Birth:  10/12/1940  BSA:          1.864 m Patient Age:    81 years   BP:           109/59 mmHg Patient Gender: F          HR:           86 bpm. Exam Location:  Inpatient Procedure: Limited Echo, Cardiac Doppler and Limited Color Doppler Indications:    Pulmonary Embolus I26.09  History:        Patient has prior history of Echocardiogram examinations, most                 recent 05/01/2023. CHF, CAD and Previous Myocardial Infarction,                 P.E. and COPD, Mitral Valve Disease, Signs/Symptoms:Edema; Risk                 Factors:Diabetes, Hypertension and Dyslipidemia.  Sonographer:    Aron Baba Referring Phys: [id number] Dorothe Pea BRANCH IMPRESSIONS  1. Left ventricular ejection fraction, by estimation, is 20 to 25%. The left ventricle has severely decreased function. The left ventricle demonstrates global hypokinesis.  2. Right ventricular systolic function is moderately reduced. The right ventricular size is mildly enlarged. There is moderately elevated pulmonary artery systolic pressure. The estimated right ventricular systolic pressure is 52.9 mmHg.  3. Moderate mitral valve regurgitation.   4. The inferior vena cava is dilated in size with <50% respiratory variability, suggesting right atrial pressure of 15 mmHg. FINDINGS  Left Ventricle: Left ventricular ejection fraction, by estimation, is 20 to 25%. The left ventricle has severely decreased function. The left ventricle demonstrates global hypokinesis. Right Ventricle: The right ventricular size is mildly enlarged. Right ventricular systolic function is moderately reduced. There is moderately elevated pulmonary artery systolic pressure. The tricuspid regurgitant velocity is 3.08 m/s, and with an assumed right atrial pressure of 15 mmHg, the estimated right ventricular systolic pressure is 52.9 mmHg. Pericardium: There is no evidence of pericardial effusion. Mitral Valve: Moderate mitral valve regurgitation. Tricuspid Valve: Tricuspid valve regurgitation is mild. Venous: The inferior vena cava is dilated in size with less than 50% respiratory variability, suggesting right atrial pressure of 15 mmHg. RIGHT VENTRICLE RV S prime:     7.94 cm/s TAPSE (M-mode): 1.2 cm TRICUSPID VALVE TR Peak grad:   37.9 mmHg TR Vmax:        308.00 cm/s Donato Schultz MD Electronically signed by Donato Schultz MD Signature Date/Time: 05/21/2023/3:40:25 PM    Final     Cardiac Studies  TTE 05/21/2023  1. Left ventricular ejection fraction, by estimation, is 20 to 25%. The  left ventricle has severely decreased function. The left ventricle  demonstrates global hypokinesis.   2. Right ventricular systolic function is moderately reduced. The right  ventricular size is mildly enlarged. There is moderately elevated  pulmonary artery systolic pressure. The estimated right ventricular  systolic pressure is 52.9 mmHg.   3. Moderate mitral valve regurgitation.   4. The inferior vena cava is dilated in size with <50% respiratory  variability, suggesting right atrial pressure of 15 mmHg.   CMR 12/16/2021 MPRESSION: 1.  No right atrial mass seen  2. Subendocardial late  gadolinium enhancement consistent with prior infarct in LAD territory (basal anteroseptum, mid anterior/anteroseptal, apical anterior/septal, and apex). LGE is greater than 50% transmural suggesting nonviability in this territory   3.  Severe LV dilatation with severe systolic dysfunction (EF 24%)   4.  Normal RV size and systolic function (EF 52%)   5.  Moderate mitral regurgitation (regurgitant fraction 26%)   6.  Opacity at left lung base, recommend dedicated lung imaging  Patient Profile  Angela Lucero is a 82 y.o. female with CAD, systolic heart failure/ischemic cardiomyopathy, stroke, diabetes, CKD stage IIIb, COPD on home oxygen admitted on 05/20/2023 for acute PE and acute on chronic systolic heart failure.  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  He appears euvolemic to me.  2.4 L urine output.  Transition to torsemide 40 mg p.o. twice daily. -Continue carvedilol 6.25 mg twice daily, Jardiance 10 mg daily. -Add hydralazine 10 mg 3 times daily and Imdur 15 mg daily for afterload reduction.  Not a candidate for ACE/ARB/ARNI/MRA given CKD stage IIIb. -Would benefit from outpatient evaluation for MitraClip.  Unclear if she will want to undergo procedures.  She did discuss with her outpatient cardiologist. -Multiple admissions for acute heart failure.  Advanced age with multiple comorbidities.  Limited options.  Would benefit from palliative care consultation.  # CAD, LAD infarct -Cardiac MRI with nonviable myocardium.  Continue aspirin and statin.  No symptoms of angina.  # Acute on chronic CKD stage IIIb -Stop spironolactone.  Not a candidate for this based on renal function.  Transition to Imdur and hydralazine.  # Acute PE -Eliquis  # COPD -per HMS    For questions or  updates, please contact [facility] Please consult [email or web address] for contact info under   Signed, Gerri Spore T. Flora Lipps, MD, [facility]  [facility]  05/23/2023 9:50 AM

## 2023-05-23 NOTE — Progress Notes (Addendum)
Progress Note   Patient: Angela Lucero WUJ:[id number] DOB: [birth year] DOA: 05/20/2023     3 DOS: the patient was seen and examined on 05/23/2023   Brief hospital course: Angela Lucero was admitted to the hospital with the working diagnosis of acute pulmonary embolism and heart failure exacerbation.   82 yo female with the past medical history of heart failure, coronary artery disease, COPD, CKD, T2DM and chronic hypoxia who presented with dyspnea. Recent hospitalization 09/01 to 05/09/23 closed right femoral neck fracture, sp left hemiarthroplasty on 09/03. Hospitalization complicated with acute heart failure decompensation. She was discharge to SNF.  While in the SNF she had chest pain and dyspnea. Because intensity of chest pain she was transported to the ED. On her initial physical examination her blood pressure was 104/59, HR 72, RR 27 and 02 saturation 96%, lungs with rales bilaterally, heart with S1 and S2 present and regular, abdomen with tenderness in the suprapubic region, positive lower extremity edema, more left than right.   Na 136, K 4,5 Cl 93 bicarbonate 29, glucose 176 bun 70 cr 2,29  BNP 2,985 High sensitive troponin 26 and 24  Wbc 9,0 hgb 9,6 plt 335  Sars covid 19 negative  Urine analysis with SG 1,012, large leukocytes, moderate Hgb, 21-50 wbc, 11-20 rbc.   Chest radiograph with cardiomegaly with bilateral hilar vascular congestion, bilateral basal increased interstitial marking with small right pleural effusion.   CT chest with small non occlusive pulmonary embolism in the bilateral lobe distal segmental and proximal subsegmental pulmonary artery levels. Bilateral ground glass opacities, with interlobular septal thickening and small right pleural effusion with compression atelectasis.   EKG 72 bpm, left axis deviation, qtc 500, right bundle branch block, sinus rhythm with no significant ST segment or T wave changes.   09/22 patient transitioned to oral anticoagulation, her volume  status has improved. Large bowel movement.  09/23 volume status has improved. Consulted palliative care due to advanced heart failure. Possible transfer to SNF tomorrow.   Assessment and Plan: * Acute pulmonary embolism (HCC) Echocardiogram with reduced LV systolic function, with EF 20 to 25%, global hypokinesis, RV systolic function with moderate reduction, RV with mild dilatation, RVSP 52.9, moderate mitral valve regurgitation, mild TR   Lower extremities US doppler negative for deep vein thrombosis.   02 saturation is 94% on 4 L/min per [place].  Patient hemodynamically stable, has been transitioned to direct oral anticoagulation with apixaban with good toleration.   Acute on chronic systolic CHF (congestive heart failure) (HCC) Echocardiogram with reduced LV systolic function 20 to 25%, global hypokinesis. RV function with moderate reduction, RV with mild enlargement, RVSP 52.9 mmHg, moderate MR,   Documented urine output is 1,200 ml Systolic blood pressure 109 to 116 mmHg.   Continue with SGLT 2 inh, carvedilol.  Added hydralazine and isosorbide for after load reduction.  Transition to oral loop diuretic to torsemide 40 mg po bid.  Holding on RAAS inhibition for now due to unstable GFR.   AKI (acute kidney injury) (HCC) Hyponatremia. CKD stage 3a.   Renal function with serum cr at 1,96 with K at 4,9 and serum bicarbonate at 31. Na 132. Mg 2.9   Loop diuretic with torsemide 40 mg po bid Follow up renal function and electrolytes in am.   History of CAD (coronary artery disease) Status post stent angioplasty Continue aspirin, carvedilol and atorvastatin  History of hemiarthroplasty of right hip Status post right hip hemiarthroplasty which was done on 05/03/23 for hip fracture PT  and OT   UTI (urinary tract infection) Symptomatic Patient has dysuria and frequency of urination.  She also has pyuria. Urine culture positive for Pseudomonas.   Will change from ceftriaxone to  fosfomycin.   COPD (chronic obstructive pulmonary disease) (HCC) No clinical signs of acute exacerbation. Plan to continue bronchodilator therapy. Off systemic corticosteroids.  Continue oxymetry monitoring and supplemental 0-2 per [place] to keep 02 saturation 88% or greater.   GAD (generalized anxiety disorder) Continue buspirone        Subjective: Patient with no chest pain, dyspnea is back to baseline, no PND or orthopnea, lower extremity edema has improved.   Physical Exam: Vitals:   05/22/23 1929 05/22/23 2329 05/23/23 0423 05/23/23 0738  BP: 109/63 120/65 109/62 (!) 116/59  Pulse: 72 80 70 75  Resp: 18 19 15    Temp: (!) 97.4 F (36.3 C) 97.6 F (36.4 C) 97.8 F (36.6 C)   TempSrc: Oral Oral Oral   SpO2: 100% 95% 99% 99%  Weight:   75.4 kg   Height:       Neurology awake and alert ENT with mild pallor Cardiovascular with S1 and S2 present and regular, positive systolic murmur at the apex. No gallops No JVD Respiratory with no rales. Prolonged expiratory phase and mild wheezing on expiration  Abdomen with no distention  No lower extremity edema  Data Reviewed:    Family Communication: no family at the bedside   Disposition: Status is: Inpatient Remains inpatient appropriate because: recovering from heart failure exacerbation.   Planned Discharge Destination: Skilled nursing facility    Author: Coralie Keens, MD 05/23/2023 11:50 AM  For on call review [email or web address].

## 2023-05-24 ENCOUNTER — Other Ambulatory Visit: Payer: Self-pay | Admitting: Internal Medicine

## 2023-05-24 DIAGNOSIS — R531 Weakness: Secondary | ICD-10-CM

## 2023-05-24 DIAGNOSIS — I5022 Chronic systolic (congestive) heart failure: Secondary | ICD-10-CM | POA: Diagnosis not present

## 2023-05-24 DIAGNOSIS — N1832 Chronic kidney disease, stage 3b: Secondary | ICD-10-CM | POA: Diagnosis present

## 2023-05-24 DIAGNOSIS — S72041D Displaced fracture of base of neck of right femur, subsequent encounter for closed fracture with routine healing: Secondary | ICD-10-CM | POA: Diagnosis not present

## 2023-05-24 DIAGNOSIS — E1122 Type 2 diabetes mellitus with diabetic chronic kidney disease: Secondary | ICD-10-CM | POA: Diagnosis not present

## 2023-05-24 DIAGNOSIS — S72001D Fracture of unspecified part of neck of right femur, subsequent encounter for closed fracture with routine healing: Secondary | ICD-10-CM | POA: Diagnosis not present

## 2023-05-24 DIAGNOSIS — Z86711 Personal history of pulmonary embolism: Secondary | ICD-10-CM | POA: Diagnosis not present

## 2023-05-24 DIAGNOSIS — Z602 Problems related to living alone: Secondary | ICD-10-CM | POA: Diagnosis present

## 2023-05-24 DIAGNOSIS — J9611 Chronic respiratory failure with hypoxia: Secondary | ICD-10-CM | POA: Diagnosis present

## 2023-05-24 DIAGNOSIS — Z96641 Presence of right artificial hip joint: Secondary | ICD-10-CM | POA: Diagnosis not present

## 2023-05-24 DIAGNOSIS — K625 Hemorrhage of anus and rectum: Secondary | ICD-10-CM | POA: Diagnosis not present

## 2023-05-24 DIAGNOSIS — E785 Hyperlipidemia, unspecified: Secondary | ICD-10-CM | POA: Diagnosis present

## 2023-05-24 DIAGNOSIS — I5021 Acute systolic (congestive) heart failure: Secondary | ICD-10-CM | POA: Diagnosis not present

## 2023-05-24 DIAGNOSIS — I5023 Acute on chronic systolic (congestive) heart failure: Secondary | ICD-10-CM | POA: Diagnosis not present

## 2023-05-24 DIAGNOSIS — Z23 Encounter for immunization: Secondary | ICD-10-CM | POA: Diagnosis not present

## 2023-05-24 DIAGNOSIS — R195 Other fecal abnormalities: Secondary | ICD-10-CM | POA: Diagnosis not present

## 2023-05-24 DIAGNOSIS — E1169 Type 2 diabetes mellitus with other specified complication: Secondary | ICD-10-CM | POA: Diagnosis present

## 2023-05-24 DIAGNOSIS — R079 Chest pain, unspecified: Secondary | ICD-10-CM | POA: Diagnosis not present

## 2023-05-24 DIAGNOSIS — J441 Chronic obstructive pulmonary disease with (acute) exacerbation: Secondary | ICD-10-CM | POA: Diagnosis not present

## 2023-05-24 DIAGNOSIS — R5381 Other malaise: Secondary | ICD-10-CM | POA: Diagnosis not present

## 2023-05-24 DIAGNOSIS — I2510 Atherosclerotic heart disease of native coronary artery without angina pectoris: Secondary | ICD-10-CM | POA: Diagnosis present

## 2023-05-24 DIAGNOSIS — I130 Hypertensive heart and chronic kidney disease with heart failure and stage 1 through stage 4 chronic kidney disease, or unspecified chronic kidney disease: Secondary | ICD-10-CM | POA: Diagnosis present

## 2023-05-24 DIAGNOSIS — N390 Urinary tract infection, site not specified: Secondary | ICD-10-CM | POA: Diagnosis not present

## 2023-05-24 DIAGNOSIS — D62 Acute posthemorrhagic anemia: Secondary | ICD-10-CM | POA: Diagnosis present

## 2023-05-24 DIAGNOSIS — Z7189 Other specified counseling: Secondary | ICD-10-CM | POA: Diagnosis not present

## 2023-05-24 DIAGNOSIS — E876 Hypokalemia: Secondary | ICD-10-CM | POA: Diagnosis present

## 2023-05-24 DIAGNOSIS — Z7401 Bed confinement status: Secondary | ICD-10-CM | POA: Diagnosis not present

## 2023-05-24 DIAGNOSIS — Z79899 Other long term (current) drug therapy: Secondary | ICD-10-CM | POA: Diagnosis not present

## 2023-05-24 DIAGNOSIS — R1312 Dysphagia, oropharyngeal phase: Secondary | ICD-10-CM | POA: Diagnosis not present

## 2023-05-24 DIAGNOSIS — F419 Anxiety disorder, unspecified: Secondary | ICD-10-CM | POA: Diagnosis not present

## 2023-05-24 DIAGNOSIS — J440 Chronic obstructive pulmonary disease with acute lower respiratory infection: Secondary | ICD-10-CM | POA: Diagnosis present

## 2023-05-24 DIAGNOSIS — J189 Pneumonia, unspecified organism: Secondary | ICD-10-CM | POA: Diagnosis present

## 2023-05-24 DIAGNOSIS — I340 Nonrheumatic mitral (valve) insufficiency: Secondary | ICD-10-CM | POA: Diagnosis present

## 2023-05-24 DIAGNOSIS — R062 Wheezing: Secondary | ICD-10-CM | POA: Diagnosis not present

## 2023-05-24 DIAGNOSIS — I517 Cardiomegaly: Secondary | ICD-10-CM | POA: Diagnosis not present

## 2023-05-24 DIAGNOSIS — N179 Acute kidney failure, unspecified: Secondary | ICD-10-CM | POA: Diagnosis not present

## 2023-05-24 DIAGNOSIS — N189 Chronic kidney disease, unspecified: Secondary | ICD-10-CM

## 2023-05-24 DIAGNOSIS — Z8679 Personal history of other diseases of the circulatory system: Secondary | ICD-10-CM | POA: Diagnosis not present

## 2023-05-24 DIAGNOSIS — K922 Gastrointestinal hemorrhage, unspecified: Secondary | ICD-10-CM | POA: Diagnosis not present

## 2023-05-24 DIAGNOSIS — R262 Difficulty in walking, not elsewhere classified: Secondary | ICD-10-CM | POA: Diagnosis not present

## 2023-05-24 DIAGNOSIS — R41841 Cognitive communication deficit: Secondary | ICD-10-CM | POA: Diagnosis not present

## 2023-05-24 DIAGNOSIS — I10 Essential (primary) hypertension: Secondary | ICD-10-CM | POA: Diagnosis not present

## 2023-05-24 DIAGNOSIS — K5731 Diverticulosis of large intestine without perforation or abscess with bleeding: Secondary | ICD-10-CM | POA: Diagnosis present

## 2023-05-24 DIAGNOSIS — J449 Chronic obstructive pulmonary disease, unspecified: Secondary | ICD-10-CM | POA: Diagnosis not present

## 2023-05-24 DIAGNOSIS — F411 Generalized anxiety disorder: Secondary | ICD-10-CM | POA: Diagnosis present

## 2023-05-24 DIAGNOSIS — I272 Pulmonary hypertension, unspecified: Secondary | ICD-10-CM | POA: Diagnosis present

## 2023-05-24 DIAGNOSIS — N183 Chronic kidney disease, stage 3 unspecified: Secondary | ICD-10-CM | POA: Diagnosis not present

## 2023-05-24 DIAGNOSIS — R0689 Other abnormalities of breathing: Secondary | ICD-10-CM | POA: Diagnosis not present

## 2023-05-24 DIAGNOSIS — I2699 Other pulmonary embolism without acute cor pulmonale: Secondary | ICD-10-CM | POA: Diagnosis not present

## 2023-05-24 DIAGNOSIS — Z7982 Long term (current) use of aspirin: Secondary | ICD-10-CM | POA: Diagnosis not present

## 2023-05-24 DIAGNOSIS — R296 Repeated falls: Secondary | ICD-10-CM | POA: Diagnosis not present

## 2023-05-24 DIAGNOSIS — K921 Melena: Secondary | ICD-10-CM | POA: Diagnosis not present

## 2023-05-24 DIAGNOSIS — Z4789 Encounter for other orthopedic aftercare: Secondary | ICD-10-CM | POA: Diagnosis not present

## 2023-05-24 DIAGNOSIS — R58 Hemorrhage, not elsewhere classified: Secondary | ICD-10-CM | POA: Diagnosis not present

## 2023-05-24 DIAGNOSIS — Z66 Do not resuscitate: Secondary | ICD-10-CM | POA: Diagnosis present

## 2023-05-24 DIAGNOSIS — Z7901 Long term (current) use of anticoagulants: Secondary | ICD-10-CM | POA: Diagnosis not present

## 2023-05-24 DIAGNOSIS — I700 Atherosclerosis of aorta: Secondary | ICD-10-CM | POA: Diagnosis not present

## 2023-05-24 DIAGNOSIS — R059 Cough, unspecified: Secondary | ICD-10-CM | POA: Diagnosis not present

## 2023-05-24 DIAGNOSIS — M6259 Muscle wasting and atrophy, not elsewhere classified, multiple sites: Secondary | ICD-10-CM | POA: Diagnosis not present

## 2023-05-24 DIAGNOSIS — F17210 Nicotine dependence, cigarettes, uncomplicated: Secondary | ICD-10-CM | POA: Diagnosis not present

## 2023-05-24 DIAGNOSIS — Z7984 Long term (current) use of oral hypoglycemic drugs: Secondary | ICD-10-CM | POA: Diagnosis not present

## 2023-05-24 LAB — GLUCOSE, CAPILLARY: Glucose-Capillary: 122 mg/dL — ABNORMAL HIGH (ref 70–99)

## 2023-05-24 LAB — BASIC METABOLIC PANEL
Anion gap: 11 (ref 5–15)
BUN: 71 mg/dL — ABNORMAL HIGH (ref 8–23)
CO2: 28 mmol/L (ref 22–32)
Calcium: 8.4 mg/dL — ABNORMAL LOW (ref 8.9–10.3)
Chloride: 94 mmol/L — ABNORMAL LOW (ref 98–111)
Creatinine, Ser: 1.93 mg/dL — ABNORMAL HIGH (ref 0.44–1.00)
GFR, Estimated: 26 mL/min — ABNORMAL LOW (ref >60)
Glucose, Bld: 134 mg/dL — ABNORMAL HIGH (ref 70–99)
Potassium: 4.5 mmol/L (ref 3.5–5.1)
Sodium: 133 mmol/L — ABNORMAL LOW (ref 135–145)

## 2023-05-24 LAB — URINE CULTURE: Culture: >=100000 — AB

## 2023-05-24 MED ORDER — HYDROCODONE-ACETAMINOPHEN 5-325 MG PO TABS: 1.0000 | ORAL_TABLET | ORAL | 0 refills | Status: DC | PRN

## 2023-05-24 MED ORDER — CLONAZEPAM 0.5 MG PO TABS: 0.5000 mg | ORAL_TABLET | Freq: Three times a day (TID) | ORAL | 0 refills | Status: DC | PRN

## 2023-05-24 MED ORDER — ISOSORBIDE MONONITRATE ER 30 MG PO TB24: 30.0000 mg | ORAL_TABLET | Freq: Every day | ORAL | Status: DC

## 2023-05-24 MED ORDER — ISOSORBIDE MONONITRATE ER 30 MG PO TB24: 30.0000 mg | ORAL_TABLET | Freq: Every day | ORAL | 0 refills | Status: AC

## 2023-05-24 MED ORDER — APIXABAN 5 MG PO TABS: ORAL_TABLET | ORAL | 0 refills | Status: AC

## 2023-05-24 MED ORDER — ZINC OXIDE 40 % EX OINT
TOPICAL_OINTMENT | Freq: Three times a day (TID) | CUTANEOUS | Status: DC | PRN
  Filled 2023-05-24: qty 57

## 2023-05-24 MED ORDER — HYDRALAZINE HCL 25 MG PO TABS: 25.0000 mg | ORAL_TABLET | Freq: Three times a day (TID) | ORAL | 0 refills | Status: AC

## 2023-05-24 MED ORDER — HYDRALAZINE HCL 25 MG PO TABS: 25.0000 mg | ORAL_TABLET | Freq: Three times a day (TID) | ORAL | Status: DC

## 2023-05-24 MED ORDER — TORSEMIDE 40 MG PO TABS: 40.0000 mg | ORAL_TABLET | Freq: Two times a day (BID) | ORAL | 0 refills | Status: AC

## 2023-05-24 NOTE — Progress Notes (Signed)
Patient ID: Angela Lucero, female   DOB: 01/27/1941, 82 y.o.   MRN: [id number]    Progress Note from the Palliative Medicine Team at [facility]   Patient Name: Angela Lucero        Date: 05/24/2023 DOB: 10/01/1940  Age: 82 y.o. MRN#: [id number] Attending Physician: Coralie Keens Primary Care Physician: [name], MD Admit Date: 05/20/2023   Reason for Consultation/Follow-up   Establish GOCs   HPI/ Brief Hospital Review  82 y.o. female   admitted on 05/20/2023 with past medical history of heart failure, coronary artery disease, COPD, CKD, T2DM and chronic hypoxia who presented with dyspnea.    Recent hospitalization 09/01 to 05/09/23 closed right femoral neck fracture, sp left hemiarthroplasty on 09/03. Hospitalization complicated with acute heart failure decompensation. She was discharge to SNF.    While in the SNF she had chest pain and dyspnea. Because intensity of chest pain she was transported to the ED. On her initial physical examination her blood pressure was 104/59, HR 72, RR 27 and 02 saturation 96%, lungs with rales bilaterally, heart with S1 and S2 present and regular, abdomen with tenderness in the suprapubic region, positive lower extremity edema, more left than right.    Patient and family face treatment option decisions, advanced directive decisions and anticipatory care needs.   Subjective  Extensive chart review has been completed prior to meeting with patient/family  including labs, vital signs, imaging, progress/consult notes, orders, medications and available advance directive documents.    This NP assessed patient at the bedside as a follow up to  yesterday's GOCs meeting.        Education offered today regarding  the importance of continued conversation with family and their  medical providers regarding overall plan of care and treatment options,  ensuring decisions are within the context of the patients values and GOCs.  Questions and concerns  addressed   Discussed with primary team and nursing staff   Time:   minutes  Detailed review of medical records ( labs, imaging, vital signs), medically appropriate exam ( MS, skin, cardia,  resp)   discussed with treatment team, counseling and education to patient, family, staff, documenting clinical information, medication management, coordination of care    Lorinda Creed NP  Palliative Medicine Team Team Phone # [phone] Pager [phone]

## 2023-05-24 NOTE — Discharge Summary (Addendum)
PERO    Full                                                             +--------+---------------+---------+-----------+----------------+-------------+     Summary: RIGHT: - There is no evidence of deep vein thrombosis in the lower extremity.  - No cystic structure found in the popliteal fossa. pulsatile waveforms  LEFT: - There is no evidence of deep vein thrombosis in the lower extremity.  - No cystic structure found in the popliteal fossa. Pulsatile waveforms.  *See table(s) above for measurements and observations. Electronically signed by Coral Else MD on 05/21/2023 at 8:40:43 PM.    Final    ECHOCARDIOGRAM LIMITED  Result Date: 05/21/2023    ECHOCARDIOGRAM LIMITED REPORT   Patient Name:   Angela Lucero  Date of Exam: 05/21/2023 Medical Rec #:  [id number]  Height:       66.0 in Accession #:    [id number] Weight:       169.5 lb Date of Birth:  05-23-1941  BSA:          1.864 m Patient Age:    81 years   BP:           109/59 mmHg Patient Gender: F          HR:           86 bpm. Exam Location:  Inpatient Procedure: Limited Echo, Cardiac Doppler and Limited Color Doppler Indications:    Pulmonary Embolus I26.09  History:        Patient has prior history of Echocardiogram examinations, most                 recent 05/01/2023. CHF, CAD and Previous Myocardial Infarction,                 P.E. and COPD, Mitral Valve Disease, Signs/Symptoms:Edema; Risk                 Factors:Diabetes, Hypertension and Dyslipidemia.  Sonographer:    Aron Baba  Referring Phys: [id number] Dorothe Pea BRANCH IMPRESSIONS  1. Left ventricular ejection fraction, by estimation, is 20 to 25%. The left ventricle has severely decreased function. The left ventricle demonstrates global hypokinesis.  2. Right ventricular systolic function is moderately reduced. The right ventricular size is mildly enlarged. There is moderately elevated pulmonary artery systolic pressure. The estimated right ventricular systolic pressure is 52.9 mmHg.  3. Moderate mitral valve regurgitation.  4. The inferior vena cava is dilated in size with <50% respiratory variability, suggesting right atrial pressure of 15 mmHg. FINDINGS  Left Ventricle: Left ventricular ejection fraction, by estimation, is 20 to 25%. The left ventricle has severely decreased function. The left ventricle demonstrates global hypokinesis. Right Ventricle: The right ventricular size is mildly enlarged. Right ventricular systolic function is moderately reduced. There is moderately elevated pulmonary artery systolic pressure. The tricuspid regurgitant velocity is 3.08 m/s, and with an assumed right atrial pressure of 15 mmHg, the estimated right ventricular systolic pressure is 52.9 mmHg. Pericardium: There is no evidence of pericardial effusion. Mitral Valve: Moderate mitral valve regurgitation. Tricuspid Valve: Tricuspid valve regurgitation is mild. Venous: The inferior vena cava is dilated in size with less than 50% respiratory variability, suggesting right atrial pressure of 15 mmHg. RIGHT VENTRICLE RV S prime:  PERO    Full                                                             +--------+---------------+---------+-----------+----------------+-------------+     Summary: RIGHT: - There is no evidence of deep vein thrombosis in the lower extremity.  - No cystic structure found in the popliteal fossa. pulsatile waveforms  LEFT: - There is no evidence of deep vein thrombosis in the lower extremity.  - No cystic structure found in the popliteal fossa. Pulsatile waveforms.  *See table(s) above for measurements and observations. Electronically signed by Coral Else MD on 05/21/2023 at 8:40:43 PM.    Final    ECHOCARDIOGRAM LIMITED  Result Date: 05/21/2023    ECHOCARDIOGRAM LIMITED REPORT   Patient Name:   Angela Lucero  Date of Exam: 05/21/2023 Medical Rec #:  [id number]  Height:       66.0 in Accession #:    [id number] Weight:       169.5 lb Date of Birth:  05-23-1941  BSA:          1.864 m Patient Age:    81 years   BP:           109/59 mmHg Patient Gender: F          HR:           86 bpm. Exam Location:  Inpatient Procedure: Limited Echo, Cardiac Doppler and Limited Color Doppler Indications:    Pulmonary Embolus I26.09  History:        Patient has prior history of Echocardiogram examinations, most                 recent 05/01/2023. CHF, CAD and Previous Myocardial Infarction,                 P.E. and COPD, Mitral Valve Disease, Signs/Symptoms:Edema; Risk                 Factors:Diabetes, Hypertension and Dyslipidemia.  Sonographer:    Aron Baba  Referring Phys: [id number] Dorothe Pea BRANCH IMPRESSIONS  1. Left ventricular ejection fraction, by estimation, is 20 to 25%. The left ventricle has severely decreased function. The left ventricle demonstrates global hypokinesis.  2. Right ventricular systolic function is moderately reduced. The right ventricular size is mildly enlarged. There is moderately elevated pulmonary artery systolic pressure. The estimated right ventricular systolic pressure is 52.9 mmHg.  3. Moderate mitral valve regurgitation.  4. The inferior vena cava is dilated in size with <50% respiratory variability, suggesting right atrial pressure of 15 mmHg. FINDINGS  Left Ventricle: Left ventricular ejection fraction, by estimation, is 20 to 25%. The left ventricle has severely decreased function. The left ventricle demonstrates global hypokinesis. Right Ventricle: The right ventricular size is mildly enlarged. Right ventricular systolic function is moderately reduced. There is moderately elevated pulmonary artery systolic pressure. The tricuspid regurgitant velocity is 3.08 m/s, and with an assumed right atrial pressure of 15 mmHg, the estimated right ventricular systolic pressure is 52.9 mmHg. Pericardium: There is no evidence of pericardial effusion. Mitral Valve: Moderate mitral valve regurgitation. Tricuspid Valve: Tricuspid valve regurgitation is mild. Venous: The inferior vena cava is dilated in size with less than 50% respiratory variability, suggesting right atrial pressure of 15 mmHg. RIGHT VENTRICLE RV S prime:  kg/m   Patient is feeling better, she had significant improvement in her dyspnea, along with edema, no chest pain.   Neurology awake and alert ENT with mild pallor Cardiovascular with S1 and S2 present and regular, positive systolic murmur at the apex. No JVD Trace non pitting lower extremity edema Respiratory with prolonged expiratory phase with scattered rhonchi with no rales or wheezing Abdomen with no distention   Condition at discharge: stable  The results of significant diagnostics from this hospitalization (including imaging, microbiology, ancillary and laboratory) are listed below for reference.   Imaging Studies: VAS [place] LOWER EXTREMITY VENOUS (DVT)  Result Date: 05/21/2023  Lower Venous DVT Study Patient Name:  TASHICA EYERLY  Date of Exam:   05/21/2023 Medical Rec #: [id number]  Accession #:    [id number] Date of Birth: 04/26/1941  Patient Gender: F Patient Age:   12 years Exam Location:  [facility] Procedure:      VAS [place] LOWER EXTREMITY VENOUS (DVT) Referring Phys: Elwyn Lade AGBATA --------------------------------------------------------------------------------  Indications: Edema, and pulmonary embolism. Other             Recent right hip fracture with repair 05/03/23. COPD on home Indications:      oxygen. Risk Factors: Chronic CHF with low EF.  Comparison Study: No prior study on file Performing Technologist: Sherren Kerns RVS  Examination Guidelines: A complete evaluation includes B-mode imaging, spectral Doppler, color Doppler, and power Doppler as needed of all accessible portions of each vessel. Bilateral testing is considered an integral part of a complete examination. Limited examinations for reoccurring indications may be performed as noted. The reflux portion of the exam is performed with the patient in reverse Trendelenburg.  +--------+---------------+---------+-----------+----------------+-------------+ RIGHT   CompressibilityPhasicitySpontaneityProperties      Thrombus                                                                 Aging         +--------+---------------+---------+-----------+----------------+-------------+ CFV     Full                               pulsatile                                                                waveforms                     +--------+---------------+---------+-----------+----------------+-------------+ SFJ     Full                                                             +--------+---------------+---------+-----------+----------------+-------------+ FV Prox Full                                                             +--------+---------------+---------+-----------+----------------+-------------+  PERO    Full                                                             +--------+---------------+---------+-----------+----------------+-------------+     Summary: RIGHT: - There is no evidence of deep vein thrombosis in the lower extremity.  - No cystic structure found in the popliteal fossa. pulsatile waveforms  LEFT: - There is no evidence of deep vein thrombosis in the lower extremity.  - No cystic structure found in the popliteal fossa. Pulsatile waveforms.  *See table(s) above for measurements and observations. Electronically signed by Coral Else MD on 05/21/2023 at 8:40:43 PM.    Final    ECHOCARDIOGRAM LIMITED  Result Date: 05/21/2023    ECHOCARDIOGRAM LIMITED REPORT   Patient Name:   Angela Lucero  Date of Exam: 05/21/2023 Medical Rec #:  [id number]  Height:       66.0 in Accession #:    [id number] Weight:       169.5 lb Date of Birth:  05-23-1941  BSA:          1.864 m Patient Age:    81 years   BP:           109/59 mmHg Patient Gender: F          HR:           86 bpm. Exam Location:  Inpatient Procedure: Limited Echo, Cardiac Doppler and Limited Color Doppler Indications:    Pulmonary Embolus I26.09  History:        Patient has prior history of Echocardiogram examinations, most                 recent 05/01/2023. CHF, CAD and Previous Myocardial Infarction,                 P.E. and COPD, Mitral Valve Disease, Signs/Symptoms:Edema; Risk                 Factors:Diabetes, Hypertension and Dyslipidemia.  Sonographer:    Aron Baba  Referring Phys: [id number] Dorothe Pea BRANCH IMPRESSIONS  1. Left ventricular ejection fraction, by estimation, is 20 to 25%. The left ventricle has severely decreased function. The left ventricle demonstrates global hypokinesis.  2. Right ventricular systolic function is moderately reduced. The right ventricular size is mildly enlarged. There is moderately elevated pulmonary artery systolic pressure. The estimated right ventricular systolic pressure is 52.9 mmHg.  3. Moderate mitral valve regurgitation.  4. The inferior vena cava is dilated in size with <50% respiratory variability, suggesting right atrial pressure of 15 mmHg. FINDINGS  Left Ventricle: Left ventricular ejection fraction, by estimation, is 20 to 25%. The left ventricle has severely decreased function. The left ventricle demonstrates global hypokinesis. Right Ventricle: The right ventricular size is mildly enlarged. Right ventricular systolic function is moderately reduced. There is moderately elevated pulmonary artery systolic pressure. The tricuspid regurgitant velocity is 3.08 m/s, and with an assumed right atrial pressure of 15 mmHg, the estimated right ventricular systolic pressure is 52.9 mmHg. Pericardium: There is no evidence of pericardial effusion. Mitral Valve: Moderate mitral valve regurgitation. Tricuspid Valve: Tricuspid valve regurgitation is mild. Venous: The inferior vena cava is dilated in size with less than 50% respiratory variability, suggesting right atrial pressure of 15 mmHg. RIGHT VENTRICLE RV S prime:  PERO    Full                                                             +--------+---------------+---------+-----------+----------------+-------------+     Summary: RIGHT: - There is no evidence of deep vein thrombosis in the lower extremity.  - No cystic structure found in the popliteal fossa. pulsatile waveforms  LEFT: - There is no evidence of deep vein thrombosis in the lower extremity.  - No cystic structure found in the popliteal fossa. Pulsatile waveforms.  *See table(s) above for measurements and observations. Electronically signed by Coral Else MD on 05/21/2023 at 8:40:43 PM.    Final    ECHOCARDIOGRAM LIMITED  Result Date: 05/21/2023    ECHOCARDIOGRAM LIMITED REPORT   Patient Name:   Angela Lucero  Date of Exam: 05/21/2023 Medical Rec #:  [id number]  Height:       66.0 in Accession #:    [id number] Weight:       169.5 lb Date of Birth:  05-23-1941  BSA:          1.864 m Patient Age:    81 years   BP:           109/59 mmHg Patient Gender: F          HR:           86 bpm. Exam Location:  Inpatient Procedure: Limited Echo, Cardiac Doppler and Limited Color Doppler Indications:    Pulmonary Embolus I26.09  History:        Patient has prior history of Echocardiogram examinations, most                 recent 05/01/2023. CHF, CAD and Previous Myocardial Infarction,                 P.E. and COPD, Mitral Valve Disease, Signs/Symptoms:Edema; Risk                 Factors:Diabetes, Hypertension and Dyslipidemia.  Sonographer:    Aron Baba  Referring Phys: [id number] Dorothe Pea BRANCH IMPRESSIONS  1. Left ventricular ejection fraction, by estimation, is 20 to 25%. The left ventricle has severely decreased function. The left ventricle demonstrates global hypokinesis.  2. Right ventricular systolic function is moderately reduced. The right ventricular size is mildly enlarged. There is moderately elevated pulmonary artery systolic pressure. The estimated right ventricular systolic pressure is 52.9 mmHg.  3. Moderate mitral valve regurgitation.  4. The inferior vena cava is dilated in size with <50% respiratory variability, suggesting right atrial pressure of 15 mmHg. FINDINGS  Left Ventricle: Left ventricular ejection fraction, by estimation, is 20 to 25%. The left ventricle has severely decreased function. The left ventricle demonstrates global hypokinesis. Right Ventricle: The right ventricular size is mildly enlarged. Right ventricular systolic function is moderately reduced. There is moderately elevated pulmonary artery systolic pressure. The tricuspid regurgitant velocity is 3.08 m/s, and with an assumed right atrial pressure of 15 mmHg, the estimated right ventricular systolic pressure is 52.9 mmHg. Pericardium: There is no evidence of pericardial effusion. Mitral Valve: Moderate mitral valve regurgitation. Tricuspid Valve: Tricuspid valve regurgitation is mild. Venous: The inferior vena cava is dilated in size with less than 50% respiratory variability, suggesting right atrial pressure of 15 mmHg. RIGHT VENTRICLE RV S prime:  PERO    Full                                                             +--------+---------------+---------+-----------+----------------+-------------+     Summary: RIGHT: - There is no evidence of deep vein thrombosis in the lower extremity.  - No cystic structure found in the popliteal fossa. pulsatile waveforms  LEFT: - There is no evidence of deep vein thrombosis in the lower extremity.  - No cystic structure found in the popliteal fossa. Pulsatile waveforms.  *See table(s) above for measurements and observations. Electronically signed by Coral Else MD on 05/21/2023 at 8:40:43 PM.    Final    ECHOCARDIOGRAM LIMITED  Result Date: 05/21/2023    ECHOCARDIOGRAM LIMITED REPORT   Patient Name:   Angela Lucero  Date of Exam: 05/21/2023 Medical Rec #:  [id number]  Height:       66.0 in Accession #:    [id number] Weight:       169.5 lb Date of Birth:  05-23-1941  BSA:          1.864 m Patient Age:    81 years   BP:           109/59 mmHg Patient Gender: F          HR:           86 bpm. Exam Location:  Inpatient Procedure: Limited Echo, Cardiac Doppler and Limited Color Doppler Indications:    Pulmonary Embolus I26.09  History:        Patient has prior history of Echocardiogram examinations, most                 recent 05/01/2023. CHF, CAD and Previous Myocardial Infarction,                 P.E. and COPD, Mitral Valve Disease, Signs/Symptoms:Edema; Risk                 Factors:Diabetes, Hypertension and Dyslipidemia.  Sonographer:    Aron Baba  Referring Phys: [id number] Dorothe Pea BRANCH IMPRESSIONS  1. Left ventricular ejection fraction, by estimation, is 20 to 25%. The left ventricle has severely decreased function. The left ventricle demonstrates global hypokinesis.  2. Right ventricular systolic function is moderately reduced. The right ventricular size is mildly enlarged. There is moderately elevated pulmonary artery systolic pressure. The estimated right ventricular systolic pressure is 52.9 mmHg.  3. Moderate mitral valve regurgitation.  4. The inferior vena cava is dilated in size with <50% respiratory variability, suggesting right atrial pressure of 15 mmHg. FINDINGS  Left Ventricle: Left ventricular ejection fraction, by estimation, is 20 to 25%. The left ventricle has severely decreased function. The left ventricle demonstrates global hypokinesis. Right Ventricle: The right ventricular size is mildly enlarged. Right ventricular systolic function is moderately reduced. There is moderately elevated pulmonary artery systolic pressure. The tricuspid regurgitant velocity is 3.08 m/s, and with an assumed right atrial pressure of 15 mmHg, the estimated right ventricular systolic pressure is 52.9 mmHg. Pericardium: There is no evidence of pericardial effusion. Mitral Valve: Moderate mitral valve regurgitation. Tricuspid Valve: Tricuspid valve regurgitation is mild. Venous: The inferior vena cava is dilated in size with less than 50% respiratory variability, suggesting right atrial pressure of 15 mmHg. RIGHT VENTRICLE RV S prime:  PERO    Full                                                             +--------+---------------+---------+-----------+----------------+-------------+     Summary: RIGHT: - There is no evidence of deep vein thrombosis in the lower extremity.  - No cystic structure found in the popliteal fossa. pulsatile waveforms  LEFT: - There is no evidence of deep vein thrombosis in the lower extremity.  - No cystic structure found in the popliteal fossa. Pulsatile waveforms.  *See table(s) above for measurements and observations. Electronically signed by Coral Else MD on 05/21/2023 at 8:40:43 PM.    Final    ECHOCARDIOGRAM LIMITED  Result Date: 05/21/2023    ECHOCARDIOGRAM LIMITED REPORT   Patient Name:   Angela Lucero  Date of Exam: 05/21/2023 Medical Rec #:  [id number]  Height:       66.0 in Accession #:    [id number] Weight:       169.5 lb Date of Birth:  05-23-1941  BSA:          1.864 m Patient Age:    81 years   BP:           109/59 mmHg Patient Gender: F          HR:           86 bpm. Exam Location:  Inpatient Procedure: Limited Echo, Cardiac Doppler and Limited Color Doppler Indications:    Pulmonary Embolus I26.09  History:        Patient has prior history of Echocardiogram examinations, most                 recent 05/01/2023. CHF, CAD and Previous Myocardial Infarction,                 P.E. and COPD, Mitral Valve Disease, Signs/Symptoms:Edema; Risk                 Factors:Diabetes, Hypertension and Dyslipidemia.  Sonographer:    Aron Baba  Referring Phys: [id number] Dorothe Pea BRANCH IMPRESSIONS  1. Left ventricular ejection fraction, by estimation, is 20 to 25%. The left ventricle has severely decreased function. The left ventricle demonstrates global hypokinesis.  2. Right ventricular systolic function is moderately reduced. The right ventricular size is mildly enlarged. There is moderately elevated pulmonary artery systolic pressure. The estimated right ventricular systolic pressure is 52.9 mmHg.  3. Moderate mitral valve regurgitation.  4. The inferior vena cava is dilated in size with <50% respiratory variability, suggesting right atrial pressure of 15 mmHg. FINDINGS  Left Ventricle: Left ventricular ejection fraction, by estimation, is 20 to 25%. The left ventricle has severely decreased function. The left ventricle demonstrates global hypokinesis. Right Ventricle: The right ventricular size is mildly enlarged. Right ventricular systolic function is moderately reduced. There is moderately elevated pulmonary artery systolic pressure. The tricuspid regurgitant velocity is 3.08 m/s, and with an assumed right atrial pressure of 15 mmHg, the estimated right ventricular systolic pressure is 52.9 mmHg. Pericardium: There is no evidence of pericardial effusion. Mitral Valve: Moderate mitral valve regurgitation. Tricuspid Valve: Tricuspid valve regurgitation is mild. Venous: The inferior vena cava is dilated in size with less than 50% respiratory variability, suggesting right atrial pressure of 15 mmHg. RIGHT VENTRICLE RV S prime:  kg/m   Patient is feeling better, she had significant improvement in her dyspnea, along with edema, no chest pain.   Neurology awake and alert ENT with mild pallor Cardiovascular with S1 and S2 present and regular, positive systolic murmur at the apex. No JVD Trace non pitting lower extremity edema Respiratory with prolonged expiratory phase with scattered rhonchi with no rales or wheezing Abdomen with no distention   Condition at discharge: stable  The results of significant diagnostics from this hospitalization (including imaging, microbiology, ancillary and laboratory) are listed below for reference.   Imaging Studies: VAS [place] LOWER EXTREMITY VENOUS (DVT)  Result Date: 05/21/2023  Lower Venous DVT Study Patient Name:  TASHICA EYERLY  Date of Exam:   05/21/2023 Medical Rec #: [id number]  Accession #:    [id number] Date of Birth: 04/26/1941  Patient Gender: F Patient Age:   12 years Exam Location:  [facility] Procedure:      VAS [place] LOWER EXTREMITY VENOUS (DVT) Referring Phys: Elwyn Lade AGBATA --------------------------------------------------------------------------------  Indications: Edema, and pulmonary embolism. Other             Recent right hip fracture with repair 05/03/23. COPD on home Indications:      oxygen. Risk Factors: Chronic CHF with low EF.  Comparison Study: No prior study on file Performing Technologist: Sherren Kerns RVS  Examination Guidelines: A complete evaluation includes B-mode imaging, spectral Doppler, color Doppler, and power Doppler as needed of all accessible portions of each vessel. Bilateral testing is considered an integral part of a complete examination. Limited examinations for reoccurring indications may be performed as noted. The reflux portion of the exam is performed with the patient in reverse Trendelenburg.  +--------+---------------+---------+-----------+----------------+-------------+ RIGHT   CompressibilityPhasicitySpontaneityProperties      Thrombus                                                                 Aging         +--------+---------------+---------+-----------+----------------+-------------+ CFV     Full                               pulsatile                                                                waveforms                     +--------+---------------+---------+-----------+----------------+-------------+ SFJ     Full                                                             +--------+---------------+---------+-----------+----------------+-------------+ FV Prox Full                                                             +--------+---------------+---------+-----------+----------------+-------------+  PERO    Full                                                             +--------+---------------+---------+-----------+----------------+-------------+     Summary: RIGHT: - There is no evidence of deep vein thrombosis in the lower extremity.  - No cystic structure found in the popliteal fossa. pulsatile waveforms  LEFT: - There is no evidence of deep vein thrombosis in the lower extremity.  - No cystic structure found in the popliteal fossa. Pulsatile waveforms.  *See table(s) above for measurements and observations. Electronically signed by Coral Else MD on 05/21/2023 at 8:40:43 PM.    Final    ECHOCARDIOGRAM LIMITED  Result Date: 05/21/2023    ECHOCARDIOGRAM LIMITED REPORT   Patient Name:   Angela Lucero  Date of Exam: 05/21/2023 Medical Rec #:  [id number]  Height:       66.0 in Accession #:    [id number] Weight:       169.5 lb Date of Birth:  05-23-1941  BSA:          1.864 m Patient Age:    81 years   BP:           109/59 mmHg Patient Gender: F          HR:           86 bpm. Exam Location:  Inpatient Procedure: Limited Echo, Cardiac Doppler and Limited Color Doppler Indications:    Pulmonary Embolus I26.09  History:        Patient has prior history of Echocardiogram examinations, most                 recent 05/01/2023. CHF, CAD and Previous Myocardial Infarction,                 P.E. and COPD, Mitral Valve Disease, Signs/Symptoms:Edema; Risk                 Factors:Diabetes, Hypertension and Dyslipidemia.  Sonographer:    Aron Baba  Referring Phys: [id number] Dorothe Pea BRANCH IMPRESSIONS  1. Left ventricular ejection fraction, by estimation, is 20 to 25%. The left ventricle has severely decreased function. The left ventricle demonstrates global hypokinesis.  2. Right ventricular systolic function is moderately reduced. The right ventricular size is mildly enlarged. There is moderately elevated pulmonary artery systolic pressure. The estimated right ventricular systolic pressure is 52.9 mmHg.  3. Moderate mitral valve regurgitation.  4. The inferior vena cava is dilated in size with <50% respiratory variability, suggesting right atrial pressure of 15 mmHg. FINDINGS  Left Ventricle: Left ventricular ejection fraction, by estimation, is 20 to 25%. The left ventricle has severely decreased function. The left ventricle demonstrates global hypokinesis. Right Ventricle: The right ventricular size is mildly enlarged. Right ventricular systolic function is moderately reduced. There is moderately elevated pulmonary artery systolic pressure. The tricuspid regurgitant velocity is 3.08 m/s, and with an assumed right atrial pressure of 15 mmHg, the estimated right ventricular systolic pressure is 52.9 mmHg. Pericardium: There is no evidence of pericardial effusion. Mitral Valve: Moderate mitral valve regurgitation. Tricuspid Valve: Tricuspid valve regurgitation is mild. Venous: The inferior vena cava is dilated in size with less than 50% respiratory variability, suggesting right atrial pressure of 15 mmHg. RIGHT VENTRICLE RV S prime:  kg/m   Patient is feeling better, she had significant improvement in her dyspnea, along with edema, no chest pain.   Neurology awake and alert ENT with mild pallor Cardiovascular with S1 and S2 present and regular, positive systolic murmur at the apex. No JVD Trace non pitting lower extremity edema Respiratory with prolonged expiratory phase with scattered rhonchi with no rales or wheezing Abdomen with no distention   Condition at discharge: stable  The results of significant diagnostics from this hospitalization (including imaging, microbiology, ancillary and laboratory) are listed below for reference.   Imaging Studies: VAS [place] LOWER EXTREMITY VENOUS (DVT)  Result Date: 05/21/2023  Lower Venous DVT Study Patient Name:  TASHICA EYERLY  Date of Exam:   05/21/2023 Medical Rec #: [id number]  Accession #:    [id number] Date of Birth: 04/26/1941  Patient Gender: F Patient Age:   12 years Exam Location:  [facility] Procedure:      VAS [place] LOWER EXTREMITY VENOUS (DVT) Referring Phys: Elwyn Lade AGBATA --------------------------------------------------------------------------------  Indications: Edema, and pulmonary embolism. Other             Recent right hip fracture with repair 05/03/23. COPD on home Indications:      oxygen. Risk Factors: Chronic CHF with low EF.  Comparison Study: No prior study on file Performing Technologist: Sherren Kerns RVS  Examination Guidelines: A complete evaluation includes B-mode imaging, spectral Doppler, color Doppler, and power Doppler as needed of all accessible portions of each vessel. Bilateral testing is considered an integral part of a complete examination. Limited examinations for reoccurring indications may be performed as noted. The reflux portion of the exam is performed with the patient in reverse Trendelenburg.  +--------+---------------+---------+-----------+----------------+-------------+ RIGHT   CompressibilityPhasicitySpontaneityProperties      Thrombus                                                                 Aging         +--------+---------------+---------+-----------+----------------+-------------+ CFV     Full                               pulsatile                                                                waveforms                     +--------+---------------+---------+-----------+----------------+-------------+ SFJ     Full                                                             +--------+---------------+---------+-----------+----------------+-------------+ FV Prox Full                                                             +--------+---------------+---------+-----------+----------------+-------------+  PERO    Full                                                             +--------+---------------+---------+-----------+----------------+-------------+     Summary: RIGHT: - There is no evidence of deep vein thrombosis in the lower extremity.  - No cystic structure found in the popliteal fossa. pulsatile waveforms  LEFT: - There is no evidence of deep vein thrombosis in the lower extremity.  - No cystic structure found in the popliteal fossa. Pulsatile waveforms.  *See table(s) above for measurements and observations. Electronically signed by Coral Else MD on 05/21/2023 at 8:40:43 PM.    Final    ECHOCARDIOGRAM LIMITED  Result Date: 05/21/2023    ECHOCARDIOGRAM LIMITED REPORT   Patient Name:   Angela Lucero  Date of Exam: 05/21/2023 Medical Rec #:  [id number]  Height:       66.0 in Accession #:    [id number] Weight:       169.5 lb Date of Birth:  05-23-1941  BSA:          1.864 m Patient Age:    81 years   BP:           109/59 mmHg Patient Gender: F          HR:           86 bpm. Exam Location:  Inpatient Procedure: Limited Echo, Cardiac Doppler and Limited Color Doppler Indications:    Pulmonary Embolus I26.09  History:        Patient has prior history of Echocardiogram examinations, most                 recent 05/01/2023. CHF, CAD and Previous Myocardial Infarction,                 P.E. and COPD, Mitral Valve Disease, Signs/Symptoms:Edema; Risk                 Factors:Diabetes, Hypertension and Dyslipidemia.  Sonographer:    Aron Baba  Referring Phys: [id number] Dorothe Pea BRANCH IMPRESSIONS  1. Left ventricular ejection fraction, by estimation, is 20 to 25%. The left ventricle has severely decreased function. The left ventricle demonstrates global hypokinesis.  2. Right ventricular systolic function is moderately reduced. The right ventricular size is mildly enlarged. There is moderately elevated pulmonary artery systolic pressure. The estimated right ventricular systolic pressure is 52.9 mmHg.  3. Moderate mitral valve regurgitation.  4. The inferior vena cava is dilated in size with <50% respiratory variability, suggesting right atrial pressure of 15 mmHg. FINDINGS  Left Ventricle: Left ventricular ejection fraction, by estimation, is 20 to 25%. The left ventricle has severely decreased function. The left ventricle demonstrates global hypokinesis. Right Ventricle: The right ventricular size is mildly enlarged. Right ventricular systolic function is moderately reduced. There is moderately elevated pulmonary artery systolic pressure. The tricuspid regurgitant velocity is 3.08 m/s, and with an assumed right atrial pressure of 15 mmHg, the estimated right ventricular systolic pressure is 52.9 mmHg. Pericardium: There is no evidence of pericardial effusion. Mitral Valve: Moderate mitral valve regurgitation. Tricuspid Valve: Tricuspid valve regurgitation is mild. Venous: The inferior vena cava is dilated in size with less than 50% respiratory variability, suggesting right atrial pressure of 15 mmHg. RIGHT VENTRICLE RV S prime:  PERO    Full                                                             +--------+---------------+---------+-----------+----------------+-------------+     Summary: RIGHT: - There is no evidence of deep vein thrombosis in the lower extremity.  - No cystic structure found in the popliteal fossa. pulsatile waveforms  LEFT: - There is no evidence of deep vein thrombosis in the lower extremity.  - No cystic structure found in the popliteal fossa. Pulsatile waveforms.  *See table(s) above for measurements and observations. Electronically signed by Coral Else MD on 05/21/2023 at 8:40:43 PM.    Final    ECHOCARDIOGRAM LIMITED  Result Date: 05/21/2023    ECHOCARDIOGRAM LIMITED REPORT   Patient Name:   Angela Lucero  Date of Exam: 05/21/2023 Medical Rec #:  [id number]  Height:       66.0 in Accession #:    [id number] Weight:       169.5 lb Date of Birth:  05-23-1941  BSA:          1.864 m Patient Age:    81 years   BP:           109/59 mmHg Patient Gender: F          HR:           86 bpm. Exam Location:  Inpatient Procedure: Limited Echo, Cardiac Doppler and Limited Color Doppler Indications:    Pulmonary Embolus I26.09  History:        Patient has prior history of Echocardiogram examinations, most                 recent 05/01/2023. CHF, CAD and Previous Myocardial Infarction,                 P.E. and COPD, Mitral Valve Disease, Signs/Symptoms:Edema; Risk                 Factors:Diabetes, Hypertension and Dyslipidemia.  Sonographer:    Aron Baba  Referring Phys: [id number] Dorothe Pea BRANCH IMPRESSIONS  1. Left ventricular ejection fraction, by estimation, is 20 to 25%. The left ventricle has severely decreased function. The left ventricle demonstrates global hypokinesis.  2. Right ventricular systolic function is moderately reduced. The right ventricular size is mildly enlarged. There is moderately elevated pulmonary artery systolic pressure. The estimated right ventricular systolic pressure is 52.9 mmHg.  3. Moderate mitral valve regurgitation.  4. The inferior vena cava is dilated in size with <50% respiratory variability, suggesting right atrial pressure of 15 mmHg. FINDINGS  Left Ventricle: Left ventricular ejection fraction, by estimation, is 20 to 25%. The left ventricle has severely decreased function. The left ventricle demonstrates global hypokinesis. Right Ventricle: The right ventricular size is mildly enlarged. Right ventricular systolic function is moderately reduced. There is moderately elevated pulmonary artery systolic pressure. The tricuspid regurgitant velocity is 3.08 m/s, and with an assumed right atrial pressure of 15 mmHg, the estimated right ventricular systolic pressure is 52.9 mmHg. Pericardium: There is no evidence of pericardial effusion. Mitral Valve: Moderate mitral valve regurgitation. Tricuspid Valve: Tricuspid valve regurgitation is mild. Venous: The inferior vena cava is dilated in size with less than 50% respiratory variability, suggesting right atrial pressure of 15 mmHg. RIGHT VENTRICLE RV S prime:  kg/m   Patient is feeling better, she had significant improvement in her dyspnea, along with edema, no chest pain.   Neurology awake and alert ENT with mild pallor Cardiovascular with S1 and S2 present and regular, positive systolic murmur at the apex. No JVD Trace non pitting lower extremity edema Respiratory with prolonged expiratory phase with scattered rhonchi with no rales or wheezing Abdomen with no distention   Condition at discharge: stable  The results of significant diagnostics from this hospitalization (including imaging, microbiology, ancillary and laboratory) are listed below for reference.   Imaging Studies: VAS [place] LOWER EXTREMITY VENOUS (DVT)  Result Date: 05/21/2023  Lower Venous DVT Study Patient Name:  TASHICA EYERLY  Date of Exam:   05/21/2023 Medical Rec #: [id number]  Accession #:    [id number] Date of Birth: 04/26/1941  Patient Gender: F Patient Age:   12 years Exam Location:  [facility] Procedure:      VAS [place] LOWER EXTREMITY VENOUS (DVT) Referring Phys: Elwyn Lade AGBATA --------------------------------------------------------------------------------  Indications: Edema, and pulmonary embolism. Other             Recent right hip fracture with repair 05/03/23. COPD on home Indications:      oxygen. Risk Factors: Chronic CHF with low EF.  Comparison Study: No prior study on file Performing Technologist: Sherren Kerns RVS  Examination Guidelines: A complete evaluation includes B-mode imaging, spectral Doppler, color Doppler, and power Doppler as needed of all accessible portions of each vessel. Bilateral testing is considered an integral part of a complete examination. Limited examinations for reoccurring indications may be performed as noted. The reflux portion of the exam is performed with the patient in reverse Trendelenburg.  +--------+---------------+---------+-----------+----------------+-------------+ RIGHT   CompressibilityPhasicitySpontaneityProperties      Thrombus                                                                 Aging         +--------+---------------+---------+-----------+----------------+-------------+ CFV     Full                               pulsatile                                                                waveforms                     +--------+---------------+---------+-----------+----------------+-------------+ SFJ     Full                                                             +--------+---------------+---------+-----------+----------------+-------------+ FV Prox Full                                                             +--------+---------------+---------+-----------+----------------+-------------+

## 2023-05-24 NOTE — Progress Notes (Signed)
Heart Failure Navigator Progress Note  Assessed for Heart & Vascular TOC clinic readiness.  Patient .  Going to SNF with Palliative care, EF 20-25%, has a [facility] appointment scheduled for 06/07/2023.   Navigator will sign off at this time.   Rhae Hammock, BSN, Scientist, clinical (histocompatibility and immunogenetics) Only

## 2023-05-24 NOTE — Plan of Care (Signed)
  Problem: Activity: Goal: Ability to ambulate and perform ADLs will improve Outcome: Progressing   Problem: Pain Management: Goal: Pain level will decrease Outcome: Progressing   Problem: Education: Goal: Ability to demonstrate management of disease process will improve Outcome: Progressing Goal: Ability to verbalize understanding of medication therapies will improve Outcome: Progressing Goal: Individualized Educational Video(s) Outcome: Progressing   Problem: Pain Managment: Goal: General experience of comfort will improve Outcome: Progressing

## 2023-05-24 NOTE — TOC Transition Note (Signed)
Transition of Care [facility]) - CM/SW Discharge Note   Patient Details  Name: Angela Lucero MRN: [id number] Date of Birth: 09/08/1940  Transition of Care [facility]) CM/SW Contact:  Leander Rams, LCSW Phone Number: 05/24/2023, 10:47 AM   Clinical Narrative:    Patient will DC to: [facility]  Anticipated DC date: 05/24/2023 Family notified: Trey Paula Transport by: Sharin Mons   Per MD patient ready for DC to [facility]. RN, patient, patient's family, and facility notified of DC. Discharge Summary and FL2 sent to facility. RN to call report prior to discharge [phone]. DC packet on chart. Ambulance transport requested for patient.   CSW will sign off for now as social work intervention is no longer needed. Please consult [place] again if new needs arise.    Final next level of care: Skilled Nursing Facility Barriers to Discharge: No Barriers Identified   Patient Goals and CMS Choice      Discharge Placement                Patient chooses bed at:  ([facility]) Patient to be transferred to facility by: [facility] Name of family member notified: Trey Paula Patient and family notified of of transfer: 05/24/23  Discharge Plan and Services Additional resources added to the After Visit Summary for                                       Social Determinants of Health (SDOH) Interventions SDOH Screenings   Food Insecurity: No Food Insecurity (05/20/2023)  Housing: Low Risk  (05/20/2023)  Transportation Needs: No Transportation Needs (05/20/2023)  Utilities: Not At Risk (05/20/2023)  Alcohol Screen: Low Risk  (02/10/2023)  Financial Resource Strain: Low Risk  (02/10/2023)  Tobacco Use: Medium Risk (05/20/2023)     Readmission Risk Interventions     No data to display          Oletta Lamas, MSW, LCSWA, LCASA Transitions of Care  Clinical Social Worker I

## 2023-05-24 NOTE — Progress Notes (Signed)
Cardiology Progress Note  Patient ID: Angela Lucero MRN: [id number] DOB: 1941-06-19 Date of Encounter: 05/24/2023  Primary Cardiologist: Gypsy Balsam, MD  Subjective   Chief Complaint: SOB  HPI: Euvolemic on exam.  Doing well on oral diuretics.  ROS:  All other ROS reviewed and negative. Pertinent positives noted in the HPI.     Inpatient Medications  Scheduled Meds:  apixaban  10 mg Oral BID   Followed by   Melene Muller ON 05/28/2023] apixaban  5 mg Oral BID   aspirin EC  81 mg Oral Daily   atorvastatin  80 mg Oral Daily   busPIRone  10 mg Oral TID   carvedilol  6.25 mg Oral BID WC   cholecalciferol  10,000 Units Oral Q0600   empagliflozin  10 mg Oral Daily   ferrous sulfate  325 mg Oral TID PC   hydrALAZINE  10 mg Oral Q8H   insulin aspart  0-15 Units Subcutaneous TID WC   isosorbide mononitrate  15 mg Oral Daily   melatonin  5 mg Oral QHS   multivitamin with minerals   Oral Daily   pantoprazole  40 mg Oral Daily   polyethylene glycol  34 g Oral Daily   sodium chloride flush  3 mL Intravenous Q12H   torsemide  40 mg Oral BID   umeclidinium-vilanterol  1 puff Inhalation Daily   Continuous Infusions:  sodium chloride     PRN Meds: sodium chloride, acetaminophen **OR** acetaminophen, albuterol, HYDROcodone-acetaminophen, liver oil-zinc oxide, nitroGLYCERIN, ondansetron **OR** ondansetron (ZOFRAN) IV, sodium chloride flush   Vital Signs   Vitals:   05/23/23 2008 05/23/23 2341 05/24/23 0453 05/24/23 0845  BP: 108/66 112/71 127/75   Pulse: 72 77 80 76  Resp: 17 19 19  (!) 22  Temp: 97.6 F (36.4 C) (!) 97.4 F (36.3 C) 97.7 F (36.5 C)   TempSrc: Oral Oral Oral   SpO2: 99% 99% 100%   Weight:   76.2 kg   Height:        Intake/Output Summary (Last 24 hours) at 05/24/2023 0904 Last data filed at 05/23/2023 2343 Gross per 24 hour  Intake 240 ml  Output 800 ml  Net -560 ml      05/24/2023    4:53 AM 05/23/2023    4:23 AM 05/22/2023    4:08 AM  Last 3 Weights   Weight (lbs) 167 lb 15.9 oz 166 lb 3.6 oz 166 lb 0.1 oz  Weight (kg) 76.2 kg 75.4 kg 75.3 kg      Telemetry  Overnight telemetry shows sinus rhythm 90s, which I personally reviewed.    Physical Exam   Vitals:   05/23/23 2008 05/23/23 2341 05/24/23 0453 05/24/23 0845  BP: 108/66 112/71 127/75   Pulse: 72 77 80 76  Resp: 17 19 19  (!) 22  Temp: 97.6 F (36.4 C) (!) 97.4 F (36.3 C) 97.7 F (36.5 C)   TempSrc: Oral Oral Oral   SpO2: 99% 99% 100%   Weight:   76.2 kg   Height:        Intake/Output Summary (Last 24 hours) at 05/24/2023 0904 Last data filed at 05/23/2023 2343 Gross per 24 hour  Intake 240 ml  Output 800 ml  Net -560 ml       05/24/2023    4:53 AM 05/23/2023    4:23 AM 05/22/2023    4:08 AM  Last 3 Weights  Weight (lbs) 167 lb 15.9 oz 166 lb 3.6 oz 166 lb 0.1 oz  Weight (  kg) 76.2 kg 75.4 kg 75.3 kg    Body mass index is 27.11 kg/m.  General: Well nourished, well developed, in no acute distress Head: Atraumatic, normal size  Eyes: PEERLA, EOMI  Neck: Supple, no JVD Endocrine: No thryomegaly Cardiac: Normal S1, S2; RRR; no murmurs, rubs, or gallops Lungs: Diminished breath sounds bilaterally Abd: Soft, nontender, no hepatomegaly  Ext: No edema, pulses 2+ Musculoskeletal: No deformities, BUE and BLE strength normal and equal Skin: Warm and dry, no rashes   Neuro: Alert and oriented to person, place, time, and situation, CNII-XII grossly intact, no focal deficits  Psych: Normal mood and affect   Labs  High Sensitivity Troponin:   Recent Labs  Lab 05/20/23 1230 05/20/23 1505  TROPONINIHS 26* 24*     Cardiac EnzymesNo results for input(s): "TROPONINI" in the last 168 hours. No results for input(s): "TROPIPOC" in the last 168 hours.  Chemistry Recent Labs  Lab 05/20/23 1230 05/20/23 1238 05/22/23 0929 05/23/23 0303 05/24/23 0237  NA 136   < > 131* 132* 133*  K 4.5   < > 4.1 4.9 4.5  CL 93*   < > 94* 92* 94*  CO2 29   < > 27 31 28   GLUCOSE 176*    < > 188* 127* 134*  BUN 70*   < > 62* 68* 71*  CREATININE 2.29*   < > 1.84* 1.96* 1.93*  CALCIUM 8.7*   < > 8.3* 8.8* 8.4*  PROT 6.3*  --   --   --   --   ALBUMIN 2.7*  --   --   --   --   AST 17  --   --   --   --   ALT 12  --   --   --   --   ALKPHOS 62  --   --   --   --   BILITOT 0.5  --   --   --   --   GFRNONAA 21*   < > 27* 25* 26*  ANIONGAP 14   < > 10 9 11    < > = values in this interval not displayed.    Hematology Recent Labs  Lab 05/20/23 1230 05/20/23 1238 05/21/23 0311  WBC 9.0  --  9.0  RBC 3.47*  --  3.47*  HGB 9.6* 10.5* 9.6*  HCT 31.9* 31.0* 31.9*  MCV 91.9  --  91.9  MCH 27.7  --  27.7  MCHC 30.1  --  30.1  RDW 19.5*  --  19.7*  PLT 335  --  290   BNP Recent Labs  Lab 05/20/23 1230  BNP 2,985.3*    DDimer No results for input(s): "DDIMER" in the last 168 hours.   Radiology  No results found.  Cardiac Studies  TTE 05/21/2023  1. Left ventricular ejection fraction, by estimation, is 20 to 25%. The  left ventricle has severely decreased function. The left ventricle  demonstrates global hypokinesis.   2. Right ventricular systolic function is moderately reduced. The right  ventricular size is mildly enlarged. There is moderately elevated  pulmonary artery systolic pressure. The estimated right ventricular  systolic pressure is 52.9 mmHg.   3. Moderate mitral valve regurgitation.   4. The inferior vena cava is dilated in size with <50% respiratory  variability, suggesting right atrial pressure of 15 mmHg.   Patient Profile  Angela Lucero is a 82 y.o. female with CAD, systolic heart failure/ischemic cardiomyopathy, stroke, diabetes, CKD stage  IIIb/IV, COPD on home oxygen admitted on 05/20/2023 for acute PE and acute on chronic systolic heart failure.   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  Doing well on torsemide 40 mg p.o. twice daily. -Blood pressure is stable on carvedilol 6.25 mg twice daily, Jardiance 10 mg daily.  Will titrate up hydralazine to 25 mg 3 times daily and Imdur 30 mg daily.  Not a candidate for ACE/ARB/ARNI/MRA given CKD stage IV. -Does have moderate to severe mitral valve regurgitation.  Could be considered as an outpatient for MitraClip procedure however multiple comorbidities may preclude this. -Also with wide QRS.  Could discuss possibility CRT but given her age medical therapy may be the best option.  # CAD, LAD infarct -Nonviable myocardium in the LAD distribution.  Continue aspirin and statin.  # CKD stage IIIb/IV -Holding Aldactone.  Close monitoring of renal function.  Kidney function really precludes aggressive cardiovascular care.  # Acute PE -Eliquis.  # Goals of care -Advanced cardiomyopathy with multiple comorbidities.  She is frail and requires chronic oxygen due to COPD.  She has had frequent heart failure admissions.  I have recommended palliative care consultation.  [facility] will sign off.   Medication Recommendations: Medications as above Other recommendations (labs, testing, etc):  none Follow up as an outpatient: We will arrange outpatient follow-up in [place] with Dr. Bing Matter in 2-4 weeks  For questions or updates, please contact [facility] Please consult [email or web address] for contact info under        Signed, Gerri Spore T. Flora Lipps, MD, [facility] [place]  [facility]  05/24/2023 9:04 AM

## 2023-05-26 DIAGNOSIS — J449 Chronic obstructive pulmonary disease, unspecified: Secondary | ICD-10-CM | POA: Diagnosis not present

## 2023-05-26 DIAGNOSIS — I5022 Chronic systolic (congestive) heart failure: Secondary | ICD-10-CM | POA: Diagnosis not present

## 2023-05-26 DIAGNOSIS — R262 Difficulty in walking, not elsewhere classified: Secondary | ICD-10-CM | POA: Diagnosis not present

## 2023-05-26 DIAGNOSIS — Z96641 Presence of right artificial hip joint: Secondary | ICD-10-CM | POA: Diagnosis not present

## 2023-05-30 DIAGNOSIS — Z96641 Presence of right artificial hip joint: Secondary | ICD-10-CM | POA: Diagnosis not present

## 2023-05-30 DIAGNOSIS — R262 Difficulty in walking, not elsewhere classified: Secondary | ICD-10-CM | POA: Diagnosis not present

## 2023-05-30 DIAGNOSIS — S72041D Displaced fracture of base of neck of right femur, subsequent encounter for closed fracture with routine healing: Secondary | ICD-10-CM | POA: Diagnosis not present

## 2023-05-30 DIAGNOSIS — E1122 Type 2 diabetes mellitus with diabetic chronic kidney disease: Secondary | ICD-10-CM | POA: Diagnosis not present

## 2023-05-30 DIAGNOSIS — I5022 Chronic systolic (congestive) heart failure: Secondary | ICD-10-CM | POA: Diagnosis not present

## 2023-06-01 DIAGNOSIS — Z7189 Other specified counseling: Secondary | ICD-10-CM | POA: Diagnosis not present

## 2023-06-01 DIAGNOSIS — F419 Anxiety disorder, unspecified: Secondary | ICD-10-CM | POA: Diagnosis not present

## 2023-06-01 DIAGNOSIS — I2699 Other pulmonary embolism without acute cor pulmonale: Secondary | ICD-10-CM | POA: Diagnosis not present

## 2023-06-01 DIAGNOSIS — I5022 Chronic systolic (congestive) heart failure: Secondary | ICD-10-CM | POA: Diagnosis not present

## 2023-06-03 DIAGNOSIS — I5022 Chronic systolic (congestive) heart failure: Secondary | ICD-10-CM | POA: Diagnosis not present

## 2023-06-03 DIAGNOSIS — R062 Wheezing: Secondary | ICD-10-CM | POA: Diagnosis not present

## 2023-06-05 ENCOUNTER — Inpatient Hospital Stay (HOSPITAL_COMMUNITY)
Admission: EM | Admit: 2023-06-05 | Discharge: 2023-06-07 | DRG: 377 | Disposition: A | Discharge status: Skilled Nursing Facility | Payer: Medicare Other | Source: Skilled Nursing Facility | Attending: Internal Medicine | Admitting: Internal Medicine

## 2023-06-05 ENCOUNTER — Observation Stay (HOSPITAL_COMMUNITY): Payer: Medicare Other

## 2023-06-05 ENCOUNTER — Encounter (HOSPITAL_COMMUNITY): Payer: Self-pay

## 2023-06-05 ENCOUNTER — Other Ambulatory Visit: Payer: Self-pay

## 2023-06-05 DIAGNOSIS — Z66 Do not resuscitate: Secondary | ICD-10-CM | POA: Diagnosis present

## 2023-06-05 DIAGNOSIS — J189 Pneumonia, unspecified organism: Secondary | ICD-10-CM | POA: Diagnosis present

## 2023-06-05 DIAGNOSIS — J9611 Chronic respiratory failure with hypoxia: Secondary | ICD-10-CM | POA: Diagnosis present

## 2023-06-05 DIAGNOSIS — Z7901 Long term (current) use of anticoagulants: Secondary | ICD-10-CM | POA: Diagnosis not present

## 2023-06-05 DIAGNOSIS — I272 Pulmonary hypertension, unspecified: Secondary | ICD-10-CM | POA: Diagnosis present

## 2023-06-05 DIAGNOSIS — Z79899 Other long term (current) drug therapy: Secondary | ICD-10-CM

## 2023-06-05 DIAGNOSIS — I340 Nonrheumatic mitral (valve) insufficiency: Secondary | ICD-10-CM | POA: Diagnosis present

## 2023-06-05 DIAGNOSIS — I2510 Atherosclerotic heart disease of native coronary artery without angina pectoris: Secondary | ICD-10-CM | POA: Diagnosis present

## 2023-06-05 DIAGNOSIS — K922 Gastrointestinal hemorrhage, unspecified: Secondary | ICD-10-CM | POA: Diagnosis not present

## 2023-06-05 DIAGNOSIS — K5731 Diverticulosis of large intestine without perforation or abscess with bleeding: Principal | ICD-10-CM | POA: Diagnosis present

## 2023-06-05 DIAGNOSIS — Z96641 Presence of right artificial hip joint: Secondary | ICD-10-CM | POA: Diagnosis present

## 2023-06-05 DIAGNOSIS — Z23 Encounter for immunization: Secondary | ICD-10-CM

## 2023-06-05 DIAGNOSIS — E785 Hyperlipidemia, unspecified: Secondary | ICD-10-CM | POA: Diagnosis present

## 2023-06-05 DIAGNOSIS — Z8673 Personal history of transient ischemic attack (TIA), and cerebral infarction without residual deficits: Secondary | ICD-10-CM

## 2023-06-05 DIAGNOSIS — I10 Essential (primary) hypertension: Secondary | ICD-10-CM | POA: Diagnosis not present

## 2023-06-05 DIAGNOSIS — Z7401 Bed confinement status: Secondary | ICD-10-CM | POA: Diagnosis not present

## 2023-06-05 DIAGNOSIS — E118 Type 2 diabetes mellitus with unspecified complications: Secondary | ICD-10-CM | POA: Diagnosis present

## 2023-06-05 DIAGNOSIS — D62 Acute posthemorrhagic anemia: Secondary | ICD-10-CM | POA: Diagnosis present

## 2023-06-05 DIAGNOSIS — F411 Generalized anxiety disorder: Secondary | ICD-10-CM | POA: Diagnosis present

## 2023-06-05 DIAGNOSIS — I5022 Chronic systolic (congestive) heart failure: Secondary | ICD-10-CM | POA: Diagnosis present

## 2023-06-05 DIAGNOSIS — Z602 Problems related to living alone: Secondary | ICD-10-CM | POA: Diagnosis present

## 2023-06-05 DIAGNOSIS — Z8249 Family history of ischemic heart disease and other diseases of the circulatory system: Secondary | ICD-10-CM

## 2023-06-05 DIAGNOSIS — Z7984 Long term (current) use of oral hypoglycemic drugs: Secondary | ICD-10-CM | POA: Diagnosis not present

## 2023-06-05 DIAGNOSIS — Z86711 Personal history of pulmonary embolism: Secondary | ICD-10-CM

## 2023-06-05 DIAGNOSIS — I517 Cardiomegaly: Secondary | ICD-10-CM | POA: Diagnosis not present

## 2023-06-05 DIAGNOSIS — E876 Hypokalemia: Secondary | ICD-10-CM | POA: Diagnosis present

## 2023-06-05 DIAGNOSIS — R195 Other fecal abnormalities: Secondary | ICD-10-CM | POA: Diagnosis not present

## 2023-06-05 DIAGNOSIS — K921 Melena: Secondary | ICD-10-CM | POA: Diagnosis not present

## 2023-06-05 DIAGNOSIS — I130 Hypertensive heart and chronic kidney disease with heart failure and stage 1 through stage 4 chronic kidney disease, or unspecified chronic kidney disease: Secondary | ICD-10-CM | POA: Diagnosis present

## 2023-06-05 DIAGNOSIS — R059 Cough, unspecified: Secondary | ICD-10-CM | POA: Diagnosis not present

## 2023-06-05 DIAGNOSIS — Z90711 Acquired absence of uterus with remaining cervical stump: Secondary | ICD-10-CM

## 2023-06-05 DIAGNOSIS — E1122 Type 2 diabetes mellitus with diabetic chronic kidney disease: Secondary | ICD-10-CM | POA: Diagnosis present

## 2023-06-05 DIAGNOSIS — K625 Hemorrhage of anus and rectum: Secondary | ICD-10-CM | POA: Diagnosis not present

## 2023-06-05 DIAGNOSIS — J449 Chronic obstructive pulmonary disease, unspecified: Secondary | ICD-10-CM | POA: Diagnosis present

## 2023-06-05 DIAGNOSIS — I2699 Other pulmonary embolism without acute cor pulmonale: Secondary | ICD-10-CM | POA: Diagnosis present

## 2023-06-05 DIAGNOSIS — E1169 Type 2 diabetes mellitus with other specified complication: Secondary | ICD-10-CM | POA: Diagnosis present

## 2023-06-05 DIAGNOSIS — Z7982 Long term (current) use of aspirin: Secondary | ICD-10-CM

## 2023-06-05 DIAGNOSIS — Z87891 Personal history of nicotine dependence: Secondary | ICD-10-CM

## 2023-06-05 DIAGNOSIS — Z9049 Acquired absence of other specified parts of digestive tract: Secondary | ICD-10-CM

## 2023-06-05 DIAGNOSIS — J440 Chronic obstructive pulmonary disease with acute lower respiratory infection: Secondary | ICD-10-CM | POA: Diagnosis present

## 2023-06-05 DIAGNOSIS — N1832 Chronic kidney disease, stage 3b: Secondary | ICD-10-CM | POA: Diagnosis present

## 2023-06-05 DIAGNOSIS — R58 Hemorrhage, not elsewhere classified: Secondary | ICD-10-CM | POA: Diagnosis not present

## 2023-06-05 DIAGNOSIS — Z8679 Personal history of other diseases of the circulatory system: Secondary | ICD-10-CM

## 2023-06-05 DIAGNOSIS — R531 Weakness: Secondary | ICD-10-CM | POA: Diagnosis not present

## 2023-06-05 LAB — I-STAT CHEM 8, ED
BUN: 57 mg/dL — ABNORMAL HIGH (ref 8–23)
Calcium, Ion: 1.17 mmol/L (ref 1.15–1.40)
Chloride: 93 mmol/L — ABNORMAL LOW (ref 98–111)
Creatinine, Ser: 1.9 mg/dL — ABNORMAL HIGH (ref 0.44–1.00)
Glucose, Bld: 212 mg/dL — ABNORMAL HIGH (ref 70–99)
HCT: 34 % — ABNORMAL LOW (ref 36.0–46.0)
Hemoglobin: 11.6 g/dL — ABNORMAL LOW (ref 12.0–15.0)
Potassium: 3.7 mmol/L (ref 3.5–5.1)
Sodium: 136 mmol/L (ref 135–145)
TCO2: 33 mmol/L — ABNORMAL HIGH (ref 22–32)

## 2023-06-05 LAB — POC OCCULT BLOOD, ED: Fecal Occult Bld: POSITIVE — AB

## 2023-06-05 LAB — CBC WITH DIFFERENTIAL/PLATELET
Abs Immature Granulocytes: 0.04 10*3/uL (ref 0.00–0.07)
Basophils Absolute: 0 10*3/uL (ref 0.0–0.1)
Basophils Relative: 0 %
Eosinophils Absolute: 0 10*3/uL (ref 0.0–0.5)
Eosinophils Relative: 0 %
HCT: 33.4 % — ABNORMAL LOW (ref 36.0–46.0)
Hemoglobin: 9.8 g/dL — ABNORMAL LOW (ref 12.0–15.0)
Immature Granulocytes: 0 %
Lymphocytes Relative: 7 %
Lymphs Abs: 0.7 10*3/uL (ref 0.7–4.0)
MCH: 27.1 pg (ref 26.0–34.0)
MCHC: 29.3 g/dL — ABNORMAL LOW (ref 30.0–36.0)
MCV: 92.5 fL (ref 80.0–100.0)
Monocytes Absolute: 0.9 10*3/uL (ref 0.1–1.0)
Monocytes Relative: 9 %
Neutro Abs: 7.7 10*3/uL (ref 1.7–7.7)
Neutrophils Relative %: 84 %
Platelets: 268 10*3/uL (ref 150–400)
RBC: 3.61 MIL/uL — ABNORMAL LOW (ref 3.87–5.11)
RDW: 19.4 % — ABNORMAL HIGH (ref 11.5–15.5)
WBC: 9.3 10*3/uL (ref 4.0–10.5)
nRBC: 0 % (ref 0.0–0.2)

## 2023-06-05 LAB — COMPREHENSIVE METABOLIC PANEL
ALT: 11 U/L (ref 0–44)
AST: 12 U/L — ABNORMAL LOW (ref 15–41)
Albumin: 3.2 g/dL — ABNORMAL LOW (ref 3.5–5.0)
Alkaline Phosphatase: 67 U/L (ref 38–126)
Anion gap: 10 (ref 5–15)
BUN: 62 mg/dL — ABNORMAL HIGH (ref 8–23)
CO2: 32 mmol/L (ref 22–32)
Calcium: 9 mg/dL (ref 8.9–10.3)
Chloride: 93 mmol/L — ABNORMAL LOW (ref 98–111)
Creatinine, Ser: 1.59 mg/dL — ABNORMAL HIGH (ref 0.44–1.00)
GFR, Estimated: 33 mL/min — ABNORMAL LOW (ref >60)
Glucose, Bld: 217 mg/dL — ABNORMAL HIGH (ref 70–99)
Potassium: 3.5 mmol/L (ref 3.5–5.1)
Sodium: 135 mmol/L (ref 135–145)
Total Bilirubin: 0.4 mg/dL (ref 0.3–1.2)
Total Protein: 7.3 g/dL (ref 6.5–8.1)

## 2023-06-05 LAB — TYPE AND SCREEN
ABO/RH(D): A POS
Antibody Screen: NEGATIVE

## 2023-06-05 MED ORDER — MORPHINE SULFATE (PF) 2 MG/ML IV SOLN: 2.0000 mg | INTRAVENOUS | Status: DC | PRN

## 2023-06-05 MED ORDER — ATORVASTATIN CALCIUM 40 MG PO TABS
80.0000 mg | ORAL_TABLET | Freq: Every day | ORAL | Status: DC
  Administered 2023-06-05 – 2023-06-07 (×3): 80 mg via ORAL
  Filled 2023-06-05 (×3): qty 2

## 2023-06-05 MED ORDER — UMECLIDINIUM-VILANTEROL 62.5-25 MCG/ACT IN AEPB
1.0000 | INHALATION_SPRAY | Freq: Every day | RESPIRATORY_TRACT | Status: DC
  Administered 2023-06-06 – 2023-06-07 (×2): 1 via RESPIRATORY_TRACT
  Filled 2023-06-05: qty 14

## 2023-06-05 MED ORDER — ONDANSETRON HCL 4 MG PO TABS
4.0000 mg | ORAL_TABLET | Freq: Four times a day (QID) | ORAL | Status: DC | PRN
  Administered 2023-06-07: 4 mg via ORAL
  Filled 2023-06-05: qty 1

## 2023-06-05 MED ORDER — FERROUS SULFATE 325 (65 FE) MG PO TABS
325.0000 mg | ORAL_TABLET | Freq: Three times a day (TID) | ORAL | Status: DC
  Administered 2023-06-05 – 2023-06-07 (×6): 325 mg via ORAL
  Filled 2023-06-05 (×6): qty 1

## 2023-06-05 MED ORDER — BUSPIRONE HCL 10 MG PO TABS
10.0000 mg | ORAL_TABLET | Freq: Three times a day (TID) | ORAL | Status: DC
  Administered 2023-06-05 – 2023-06-07 (×5): 10 mg via ORAL
  Filled 2023-06-05 (×2): qty 1
  Filled 2023-06-05 (×2): qty 2
  Filled 2023-06-05: qty 1

## 2023-06-05 MED ORDER — SODIUM CHLORIDE 0.9 % IV SOLN
2.0000 g | INTRAVENOUS | Status: DC
  Administered 2023-06-05 – 2023-06-06 (×2): 2 g via INTRAVENOUS
  Filled 2023-06-05 (×2): qty 20

## 2023-06-05 MED ORDER — DOCUSATE SODIUM 100 MG PO CAPS
100.0000 mg | ORAL_CAPSULE | Freq: Two times a day (BID) | ORAL | Status: DC
  Administered 2023-06-05 – 2023-06-07 (×4): 100 mg via ORAL
  Filled 2023-06-05 (×4): qty 1

## 2023-06-05 MED ORDER — MELATONIN 5 MG PO TABS
5.0000 mg | ORAL_TABLET | Freq: Every day | ORAL | Status: DC
  Administered 2023-06-05 – 2023-06-06 (×2): 5 mg via ORAL
  Filled 2023-06-05 (×2): qty 1

## 2023-06-05 MED ORDER — ONDANSETRON HCL 4 MG/2ML IJ SOLN: 4.0000 mg | Freq: Four times a day (QID) | INTRAMUSCULAR | Status: DC | PRN

## 2023-06-05 MED ORDER — LACTATED RINGERS IV SOLN: INTRAVENOUS | Status: AC

## 2023-06-05 MED ORDER — ACETAMINOPHEN 325 MG PO TABS: 650.0000 mg | ORAL_TABLET | Freq: Four times a day (QID) | ORAL | Status: DC | PRN

## 2023-06-05 MED ORDER — HYDROCODONE-ACETAMINOPHEN 5-325 MG PO TABS
1.0000 | ORAL_TABLET | ORAL | Status: DC | PRN
  Administered 2023-06-05 – 2023-06-07 (×7): 2 via ORAL
  Filled 2023-06-05 (×7): qty 2

## 2023-06-05 MED ORDER — FENTANYL CITRATE PF 50 MCG/ML IJ SOSY: 12.5000 ug | PREFILLED_SYRINGE | INTRAMUSCULAR | Status: DC | PRN

## 2023-06-05 MED ORDER — INFLUENZA VAC A&B SURF ANT ADJ 0.5 ML IM SUSY
0.5000 mL | PREFILLED_SYRINGE | INTRAMUSCULAR | Status: AC
  Administered 2023-06-06: 0.5 mL via INTRAMUSCULAR
  Filled 2023-06-05: qty 0.5

## 2023-06-05 MED ORDER — HYDRALAZINE HCL 25 MG PO TABS
25.0000 mg | ORAL_TABLET | Freq: Three times a day (TID) | ORAL | Status: DC
  Administered 2023-06-05 – 2023-06-07 (×4): 25 mg via ORAL
  Filled 2023-06-05 (×4): qty 1

## 2023-06-05 MED ORDER — POLYETHYLENE GLYCOL 3350 17 G PO PACK
34.0000 g | PACK | Freq: Every day | ORAL | Status: DC
  Administered 2023-06-07: 34 g via ORAL
  Filled 2023-06-05 (×2): qty 2

## 2023-06-05 MED ORDER — ACETAMINOPHEN 650 MG RE SUPP: 650.0000 mg | Freq: Four times a day (QID) | RECTAL | Status: DC | PRN

## 2023-06-05 MED ORDER — BOOST / RESOURCE BREEZE PO LIQD CUSTOM
1.0000 | Freq: Three times a day (TID) | ORAL | Status: DC
  Administered 2023-06-05 – 2023-06-07 (×4): 1 via ORAL

## 2023-06-05 MED ORDER — PANTOPRAZOLE SODIUM 40 MG PO TBEC
40.0000 mg | DELAYED_RELEASE_TABLET | Freq: Every day | ORAL | Status: DC
  Administered 2023-06-06 – 2023-06-07 (×2): 40 mg via ORAL
  Filled 2023-06-05 (×2): qty 1

## 2023-06-05 MED ORDER — CARVEDILOL 6.25 MG PO TABS
6.2500 mg | ORAL_TABLET | Freq: Two times a day (BID) | ORAL | Status: DC
  Administered 2023-06-05 – 2023-06-07 (×4): 6.25 mg via ORAL
  Filled 2023-06-05 (×2): qty 1
  Filled 2023-06-05: qty 2
  Filled 2023-06-05: qty 1

## 2023-06-05 MED ORDER — IPRATROPIUM-ALBUTEROL 0.5-2.5 (3) MG/3ML IN SOLN
3.0000 mL | Freq: Four times a day (QID) | RESPIRATORY_TRACT | Status: DC
  Administered 2023-06-05 – 2023-06-07 (×8): 3 mL via RESPIRATORY_TRACT
  Filled 2023-06-05 (×8): qty 3

## 2023-06-05 MED ORDER — HYDRALAZINE HCL 20 MG/ML IJ SOLN: 5.0000 mg | INTRAMUSCULAR | Status: DC | PRN

## 2023-06-05 MED ORDER — SODIUM CHLORIDE 0.9 % IV SOLN
500.0000 mg | INTRAVENOUS | Status: DC
  Administered 2023-06-05 – 2023-06-06 (×2): 500 mg via INTRAVENOUS
  Filled 2023-06-05 (×2): qty 5

## 2023-06-05 MED ORDER — CLONAZEPAM 0.5 MG PO TABS
0.5000 mg | ORAL_TABLET | Freq: Three times a day (TID) | ORAL | Status: DC | PRN
  Administered 2023-06-06 – 2023-06-07 (×5): 0.5 mg via ORAL
  Filled 2023-06-05 (×5): qty 1

## 2023-06-05 MED ORDER — SODIUM CHLORIDE 0.9 % IV BOLUS
500.0000 mL | Freq: Once | INTRAVENOUS | Status: AC
  Administered 2023-06-05: 500 mL via INTRAVENOUS

## 2023-06-05 MED ORDER — MAGNESIUM OXIDE -MG SUPPLEMENT 400 (240 MG) MG PO TABS
400.0000 mg | ORAL_TABLET | Freq: Every day | ORAL | Status: DC
  Administered 2023-06-06 – 2023-06-07 (×2): 400 mg via ORAL
  Filled 2023-06-05 (×2): qty 1

## 2023-06-05 MED ORDER — ISOSORBIDE MONONITRATE ER 30 MG PO TB24
30.0000 mg | ORAL_TABLET | Freq: Every day | ORAL | Status: DC
  Administered 2023-06-06 – 2023-06-07 (×2): 30 mg via ORAL
  Filled 2023-06-05 (×2): qty 1

## 2023-06-05 MED ORDER — ALBUTEROL SULFATE (2.5 MG/3ML) 0.083% IN NEBU
2.5000 mg | INHALATION_SOLUTION | RESPIRATORY_TRACT | Status: DC | PRN
  Administered 2023-06-06: 2.5 mg via RESPIRATORY_TRACT
  Filled 2023-06-05: qty 3

## 2023-06-05 MED ORDER — INSULIN ASPART 100 UNIT/ML IJ SOLN
0.0000 [IU] | Freq: Three times a day (TID) | INTRAMUSCULAR | Status: DC
  Administered 2023-06-06 (×2): 3 [IU] via SUBCUTANEOUS
  Administered 2023-06-06: 2 [IU] via SUBCUTANEOUS
  Administered 2023-06-07: 11 [IU] via SUBCUTANEOUS
  Administered 2023-06-07: 2 [IU] via SUBCUTANEOUS

## 2023-06-05 NOTE — ED Provider Notes (Signed)
Angela Lucero   CSN: [id number] Arrival date & time: 06/05/23  1304     History {Add pertinent medical, surgical, social history, OB history to HPI:1} Chief Complaint  Patient presents with   Rectal Bleeding    Angela Lucero is a 82 y.o. female.  Patient presents with rectal bleeding.  Patient has a history of coronary artery disease and congestive heart failure with a PE and recent hip surgery approximately a month ago.  Patient is at rehab center and started having some rectal bleeding   Rectal Bleeding      Home Medications Prior to Admission medications   Medication Sig Start Date End Date Taking? Authorizing Provider  acetaminophen (TYLENOL) 500 MG tablet Take 500 mg by mouth every 6 (six) hours as needed for mild pain.   Yes [provider]  albuterol (VENTOLIN HFA) 108 (90 Base) MCG/ACT inhaler TAKE 2 PUFFS BY MOUTH EVERY 6 HOURS AS NEEDED FOR WHEEZE OR SHORTNESS OF BREATH Patient taking differently: Inhale 2 puffs into the lungs every 6 (six) hours as needed for shortness of breath or wheezing. 05/24/23  Yes [name], MD  apixaban (ELIQUIS) 5 MG TABS tablet Take 2 tablets (10 mg total) by mouth 2 (two) times daily for 4 days, THEN 1 tablet (5 mg total) 2 (two) times daily. 05/24/23 06/27/23 Yes Arrien, York Ram, MD  aspirin EC 81 MG tablet Take 81 mg by mouth daily. Swallow whole.   Yes [provider]  atorvastatin (LIPITOR) 80 MG tablet TAKE 1 TABLET BY MOUTH EVERY DAY 05/19/23  Yes [name], MD  busPIRone (BUSPAR) 10 MG tablet TAKE 1 TABLET BY MOUTH THREE TIMES A DAY 05/19/23  Yes [name], MD  carvedilol (COREG) 6.25 MG tablet Take 1 tablet (6.25 mg total) by mouth 2 (two) times daily with a meal. 05/09/23 06/08/23 Yes Ghimire, Lyndel Safe, MD  Cholecalciferol (VITAMIN D3) 250 MCG (10000 UT) TABS Take 10,000 Units by mouth daily at 6 (six) AM. 05/09/23  Yes Ghimire, Lyndel Safe, MD  clonazePAM (KLONOPIN)  0.5 MG tablet Take 1 tablet (0.5 mg total) by mouth every 8 (eight) hours as needed for up to 5 days for anxiety (as needed for anxiety). 05/24/23 06/05/23 Yes Arrien, York Ram, MD  docusate sodium (COLACE) 100 MG capsule Take 1 capsule (100 mg total) by mouth 2 (two) times daily. 05/09/23  Yes Dorcas Carrow, MD  ferrous sulfate 325 (65 FE) MG tablet Take 1 tablet (325 mg total) by mouth 3 (three) times daily after meals. 05/09/23  Yes Dorcas Carrow, MD  hydrALAZINE (APRESOLINE) 25 MG tablet Take 1 tablet (25 mg total) by mouth every 8 (eight) hours. 05/24/23 06/23/23 Yes Arrien, York Ram, MD  HYDROcodone-acetaminophen [facility]) 5-325 MG tablet Take 1-2 tablets by mouth every 4 (four) hours as needed for severe pain or moderate pain. 1 tablets for moderate pain, 2 tablet for severe pain 05/24/23  Yes Arrien, York Ram, MD  ipratropium (ATROVENT HFA) 17 MCG/ACT inhaler Inhale 2 puffs into the lungs every 6 (six) hours as needed for wheezing.   Yes [provider]  isosorbide mononitrate (IMDUR) 30 MG 24 hr tablet Take 1 tablet (30 mg total) by mouth daily. 05/25/23 06/24/23 Yes Arrien, York Ram, MD  JARDIANCE 25 MG TABS tablet TAKE 1 TABLET (25 MG TOTAL) BY MOUTH DAILY. 05/24/23  Yes [name], MD  levofloxacin (LEVAQUIN) 750 MG tablet Take 750 mg by mouth daily.   Yes [provider]  magnesium oxide (MAG-OX) 400 (240 Mg) MG tablet Take 1 tablet (400 mg total) by mouth daily. 04/28/23  Yes [name], MD  melatonin 5 MG TABS Take 1 tablet (5 mg total) by mouth at bedtime. 02/24/23  Yes Angiulli, Mcarthur Rossetti, PA-C  Multiple Vitamin (MULTIVITAMIN ADULT PO) Take 1 tablet by mouth daily.   Yes [provider]  Omega 3 1000 MG CAPS Take 2 capsules by mouth in the morning and at bedtime.   Yes [provider]  pantoprazole (PROTONIX) 40 MG tablet Take 1 tablet (40 mg total) by mouth daily. 04/26/23  Yes [name], MD  polyethylene glycol (MIRALAX / GLYCOLAX)  17 g packet Take 34 g by mouth daily. 05/09/23  Yes Dorcas Carrow, MD  torsemide 40 MG TABS Take 40 mg by mouth 2 (two) times daily. 05/24/23 06/23/23 Yes Arrien, York Ram, MD  umeclidinium-vilanterol [facility] ELLIPTA) 62.5-25 MCG/ACT AEPB Inhale 1 puff into the lungs daily. 04/26/23  Yes [name], MD  albuterol (PROVENTIL) (2.5 MG/3ML) 0.083% nebulizer solution Take 2.5 mg by nebulization every 6 (six) hours as needed for wheezing or shortness of breath. Patient not taking: Reported on 06/05/2023    [provider]  Cetirizine-Pseudoephedrine (ALLERGY RELIEF D PO) Take 1 tablet by mouth daily. Patient not taking: Reported on 06/05/2023    [provider]  enoxaparin (LOVENOX) 40 MG/0.4ML injection Inject 0.3 mLs (30 mg total) into the skin daily for 25 days. For DVT prophylaxis after surgery Patient not taking: Reported on 06/05/2023 05/09/23 06/03/23  Dorcas Carrow, MD  nitroGLYCERIN (NITROSTAT) 0.4 MG SL tablet Place 1 tablet (0.4 mg total) under the tongue every 5 (five) minutes as needed for chest pain. Patient not taking: Reported on 06/05/2023 02/24/23   Angiulli, Mcarthur Rossetti, PA-C      Allergies    Patient has no known allergies.    Review of Systems   Review of Systems  Gastrointestinal:  Positive for hematochezia.    Physical Exam Updated Vital Signs BP 132/66   Pulse 93   Temp 97.9 F (36.6 C) (Oral)   Resp 17   Ht 5\' 6"  (1.676 m)   Wt 76 kg   SpO2 100%   BMI 27.04 kg/m  Physical Exam  ED Results / Procedures / Treatments   Labs (all labs ordered are listed, but only abnormal results are displayed) Labs Reviewed  CBC WITH DIFFERENTIAL/PLATELET - Abnormal; Notable for the following components:      Result Value   RBC 3.61 (*)    Hemoglobin 9.8 (*)    HCT 33.4 (*)    MCHC 29.3 (*)    RDW 19.4 (*)    All other components within normal limits  COMPREHENSIVE METABOLIC PANEL - Abnormal; Notable for the following components:   Chloride 93 (*)     Glucose, Bld 217 (*)    BUN 62 (*)    Creatinine, Ser 1.59 (*)    Albumin 3.2 (*)    AST 12 (*)    GFR, Estimated 33 (*)    All other components within normal limits  I-STAT CHEM 8, ED - Abnormal; Notable for the following components:   Chloride 93 (*)    BUN 57 (*)    Creatinine, Ser 1.90 (*)    Glucose, Bld 212 (*)    TCO2 33 (*)    Hemoglobin 11.6 (*)    HCT 34.0 (*)    All other components within normal limits  POC OCCULT BLOOD,  ED - Abnormal; Notable for the following components:   Fecal Occult Bld POSITIVE (*)    All other components within normal limits  TYPE AND SCREEN    EKG None  Radiology No results found.  Procedures Procedures  {Document cardiac monitor, telemetry assessment procedure when appropriate:1}  Medications Ordered in ED Medications  sodium chloride 0.9 % bolus 500 mL (0 mLs Intravenous Stopped 06/05/23 1457)    ED Course/ Medical Decision Making/ A&P  Patient with rectal bleeding and on Eliquis for PEs.  I spoke with pulmonary and they recommended stopping the Eliquis for a short period of time and have GI consult on the patient.  Patient may need a filter if she cannot start back the bleeding.  I spoke with [facility] Dr. Ewing Schlein and he will make sure someone sees the patient tomorrow.  Patient has been admitted to the hospitalist {   Click here for ABCD2, HEART and other calculatorsREFRESH Lucero before signing :1}                              Medical Decision Making Amount and/or Complexity of Data Reviewed Labs: ordered.  Risk Decision regarding hospitalization.   Patient with rectal bleeding on Eliquis for PE.  She is hemodynamically stable will be admitted to medicine and have the Eliquis stopped with GI consult  {Document critical care time when appropriate:1} {Document review of labs and clinical decision tools ie heart score, Chads2Vasc2 etc:1}  {Document your independent review of radiology images, and any outside records:1} {Document  your discussion with family members, caretakers, and with consultants:1} {Document social determinants of health affecting pt's care:1} {Document your decision making why or why not admission, treatments were needed:1} Final Clinical Impression(s) / ED Diagnoses Final diagnoses:  Rectal bleeding    Rx / DC Orders ED Discharge Orders     None

## 2023-06-05 NOTE — Discharge Instructions (Addendum)
Please continue to hold Eliquis and Aspirin but resume them on 10/10

## 2023-06-05 NOTE — ED Notes (Signed)
ED TO INPATIENT HANDOFF REPORT  Name/Age/Gender Angela Lucero 82 y.o. female  Code Status Code Status History     Date Active Date Inactive Code Status Order ID Comments User Context   05/20/2023 1640 05/24/2023 1609 Do not attempt resuscitation (DNR) - Comfort care [id number]  Lucile Shutters, MD ED   05/01/2023 [phone] 05/09/2023 1740 Limited: Do not attempt resuscitation (DNR) -DNR-LIMITED -Do Not Intubate/DNI  [id number]  Miguel Rota, MD Inpatient   05/01/2023 0408 05/01/2023 0938 Do not attempt resuscitation (DNR) - Comfort care [id number]  Tereasa Coop, MD Inpatient   02/10/2023 1305 02/25/2023 1517 DNR [id number]  Charlton Amor, PA-C Inpatient   02/10/2023 1305 02/10/2023 1305 Full Code [id number]  Charlton Amor, PA-C Inpatient   02/10/2023 1305 02/10/2023 1305 DNR [id number]  Charlton Amor, PA-C Inpatient   02/04/2023 1540 02/10/2023 1256 DNR [id number]  Modena Slater, DO ED   12/14/2021 1829 12/18/2021 1641 Full Code [id number]  End, Cristal Deer, MD Inpatient    Questions for Most Recent Historical Code Status (Order [id number])     Question Answer   If patient has no pulse and is not breathing Do Not Attempt Resuscitation   In Pre-Arrest Conditions (Patient Is Breathing and Has a Pulse) Provide comfort measures. Relieve any mechanical airway obstruction. Avoid transfer unless required for comfort.   Consent: Discussion documented in EHR or advanced directives reviewed            Home/SNF/Other Rehab  Chief Complaint Acute lower GI bleeding [K92.2]  Level of Care/Admitting Diagnosis ED Disposition     ED Disposition  Admit   Condition  --   Comment  Hospital Area: [facility] [[id number]]  Level of Care: Med-Surg [16]  May place patient in observation at [facility] or [facility] if equivalent level of care is available:: Yes  Covid Evaluation: Asymptomatic - no recent exposure (last 10 days) testing not required  Diagnosis: Acute lower GI bleeding [166063]   Admitting Physician: Jonah Blue [[id number]]  Attending Physician: Jonah Blue [[id number]]          Medical History Past Medical History:  Diagnosis Date   Anemia 02/13/2023   Anxiety disorder, unspecified 03/16/2023   Arthritis 2010   Lower back and neck, feet   Chronic hypoxic respiratory failure (HCC) 05/01/2023   Chronic kidney disease (CKD), stage III (moderate) (HCC) 05/01/2023   Cognitive communication deficit 03/16/2023   Congestive heart failure (CHF) (HCC) 12/10/2021   COPD (chronic obstructive pulmonary disease) (HCC)    Coronary artery disease PTCA and stenting in 1997 and 2005 in [place] 12/10/2021   CVA (cerebral vascular accident) (HCC)    1997, 2005 stents   Debility 02/10/2023   Diabetes mellitus, type II (HCC)    Displaced fracture of right femoral neck (HCC) 05/01/2023   Dyslipidemia associated with type 2 diabetes mellitus (HCC) 02/01/2018   Dysphagia, oropharyngeal phase 03/16/2023   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   Type 2 diabetes mellitus with complication, without long-term current use of insulin (HCC) 02/01/2018  Allergies No Known Allergies  IV Location/Drains/Wounds Patient Lines/Drains/Airways Status     Active Line/Drains/Airways     Name Placement date Placement time Site Days   External Urinary Catheter 06/05/23  1536  --  less than 1            Labs/Imaging Results for orders placed or performed during the hospital encounter of 06/05/23  (from the past 48 hour(s))  CBC with Differential     Status: Abnormal   Collection Time: 06/05/23  1:55 PM  Result Value Ref Range   WBC 9.3 4.0 - 10.5 K/uL   RBC 3.61 (L) 3.87 - 5.11 MIL/uL   Hemoglobin 9.8 (L) 12.0 - 15.0 g/dL   HCT 13.0 (L) 86.5 - 78.4 %   MCV 92.5 80.0 - 100.0 fL   MCH 27.1 26.0 - 34.0 pg   MCHC 29.3 (L) 30.0 - 36.0 g/dL   RDW 69.6 (H) 29.5 - 28.4 %   Platelets 268 150 - 400 K/uL   nRBC 0.0 0.0 - 0.2 %   Neutrophils Relative % 84 %   Neutro Abs 7.7 1.7 - 7.7 K/uL   Lymphocytes Relative 7 %   Lymphs Abs 0.7 0.7 - 4.0 K/uL   Monocytes Relative 9 %   Monocytes Absolute 0.9 0.1 - 1.0 K/uL   Eosinophils Relative 0 %   Eosinophils Absolute 0.0 0.0 - 0.5 K/uL   Basophils Relative 0 %   Basophils Absolute 0.0 0.0 - 0.1 K/uL   Immature Granulocytes 0 %   Abs Immature Granulocytes 0.04 0.00 - 0.07 K/uL    Comment: Performed at [facility], [address] [place], [place] [address]  Comprehensive metabolic panel     Status: Abnormal   Collection Time: 06/05/23  1:55 PM  Result Value Ref Range   Sodium 135 135 - 145 mmol/L   Potassium 3.5 3.5 - 5.1 mmol/L   Chloride 93 (L) 98 - 111 mmol/L   CO2 32 22 - 32 mmol/L   Glucose, Bld 217 (H) 70 - 99 mg/dL    Comment: Glucose reference range applies only to samples taken after fasting for at least 8 hours.   BUN 62 (H) 8 - 23 mg/dL   Creatinine, Ser 0.10 (H) 0.44 - 1.00 mg/dL   Calcium 9.0 8.9 - 27.2 mg/dL   Total Protein 7.3 6.5 - 8.1 g/dL   Albumin 3.2 (L) 3.5 - 5.0 g/dL   AST 12 (L) 15 - 41 U/L   ALT 11 0 - 44 U/L   Alkaline Phosphatase 67 38 - 126 U/L   Total Bilirubin 0.4 0.3 - 1.2 mg/dL   GFR, Estimated 33 (L) >60 mL/min    Comment: (NOTE) Calculated using the CKD-EPI Creatinine Equation (2021)    Anion gap 10 5 - 15    Comment: Performed at [facility], [address] [place], [place] [address]  Type and screen [facility]     Status: None    Collection Time: 06/05/23  1:55 PM  Result Value Ref Range   ABO/RH(D) A POS    Antibody Screen NEG    Sample Expiration      06/08/2023,2359 Performed at [facility], [address] [place], [place] [address]   I-stat chem 8, ED (not at [facility], [facility] or [facility])     Status: Abnormal   Collection Time: 06/05/23  2:06 PM  Result Value Ref Range   Sodium 136 135 - 145  mmol/L   Potassium 3.7 3.5 - 5.1 mmol/L   Chloride 93 (L) 98 - 111 mmol/L   BUN 57 (H) 8 - 23 mg/dL   Creatinine, Ser 3.24 (H) 0.44 - 1.00 mg/dL   Glucose, Bld 401 (H) 70 - 99 mg/dL    Comment: Glucose reference range applies only to samples taken after fasting for at least 8 hours.   Calcium, Ion 1.17 1.15 - 1.40 mmol/L   TCO2 33 (H) 22 - 32 mmol/L   Hemoglobin 11.6 (L) 12.0 - 15.0 g/dL   HCT 02.7 (L) 25.3 - 66.4 %  POC occult blood, ED Provider will collect     Status: Abnormal   Collection Time: 06/05/23  2:11 PM  Result Value Ref Range   Fecal Occult Bld POSITIVE (A) NEGATIVE   No results found.  Pending Labs Unresulted Labs (From admission, onward)    None       Vitals/Pain Today's Vitals   06/05/23 1318 06/05/23 1400 06/05/23 1420 06/05/23 1505  BP: 120/64 121/66  132/66  Pulse: 85 82  93  Resp: 18 18  17   Temp: 97.9 F (36.6 C)     TempSrc: Oral     SpO2: 100% 99%  100%  Weight:   76 kg   Height:   5\' 6"  (1.676 m)     Isolation Precautions No active isolations  Medications Medications  sodium chloride 0.9 % bolus 500 mL (0 mLs Intravenous Stopped 06/05/23 1457)    Mobility walks with device

## 2023-06-05 NOTE — ED Notes (Addendum)
From receiving RN Glenna Durand @ Unable to take this patient at this time. Have a escalation of care with a patient who is pending transfer to the ICU

## 2023-06-05 NOTE — H&P (Signed)
History and Physical    Patient: Angela Lucero NWG:[id number] DOB: 1940/12/21 DOA: 06/05/2023 DOS: the patient was seen and examined on 06/05/2023 PCP: [name], MD  Patient coming from: [facility] for rehab, usually lives alone; NOK: Nona Dell, [phone]   Chief Complaint: Bloody stools  HPI: Angela Lucero is a 82 y.o. 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 She was last admitted here from 9/20-24 with acute PT and acute on chronic systolic CHF with AKI.  She was discharged on Eliquis; EF was 20-25%.  She underwent R hip replacement on 9/3.   She reports that she has had all kinds of recent problems culminating with being diagnosed with PNA at her facility yesterday.  She has been coughing with wheezing and feeling more SOB and fatigued for the last few days.  Today, the staff noticed BRBPR with a BM and sent her in for evaluation.  She is not having rectal or abdominal pain and was unaware of the bleeding.    ER Course:  On Eliquis, at SNF, rectal bleeding.  Stable, rectal with BRBPR.  Pulm says stop Eliquis and have GI see her.  She may need a filter.  Dr. Ewing Schlein will see tomorrow.  If having lots of bleeding sooner, order a bleeding scan.     Review of Systems: As mentioned in the history of present illness. All other systems reviewed and are negative. Past Medical History:  Diagnosis Date   Anemia 02/13/2023   Anxiety disorder, unspecified 03/16/2023   Arthritis 2010   Lower back and neck, feet   Chronic hypoxic respiratory failure (HCC) 05/01/2023   Chronic kidney disease (CKD), stage III (moderate) (HCC) 05/01/2023   Cognitive communication deficit 03/16/2023   Congestive heart failure (CHF) (HCC) 12/10/2021   COPD (chronic obstructive pulmonary disease) (HCC)    Coronary artery disease PTCA and stenting in 1997 and 2005 in [place] 12/10/2021   CVA  (cerebral vascular accident) (HCC)    1997, 2005 stents   Debility 02/10/2023   Diabetes mellitus, type II (HCC)    Displaced fracture of right femoral neck (HCC) 05/01/2023   Dyslipidemia associated with type 2 diabetes mellitus (HCC) 02/01/2018   Dysphagia, oropharyngeal phase 03/16/2023   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   Type 2 diabetes mellitus with complication, without long-term current use of insulin (HCC) 02/01/2018   Past Surgical History:  Procedure Laterality Date   APPENDECTOMY  1971   CHOLECYSTECTOMY     LEFT HEART CATH AND CORONARY ANGIOGRAPHY N/A 12/14/2021   Procedure: LEFT HEART CATH AND CORONARY ANGIOGRAPHY;  Surgeon: Yvonne Kendall, MD;  Location: [facility];  Service: Cardiovascular;  Laterality: N/A;   Navel hernia repair  2007   PARTIAL HYSTERECTOMY  1976   Stent Heart     TOTAL HIP ARTHROPLASTY Right 05/03/2023   Procedure: POSTERIOR HEMI HIP;  Surgeon: Teryl Lucy, MD;  Location: [facility] OR;  Service: Orthopedics;  Laterality: Right;   TUBAL LIGATION  1974   Ulna nerve transpost L arm     Social History:  reports that she quit smoking about 4 months ago. Her smoking use included cigarettes. She has never used smokeless tobacco. She reports that she does not currently use alcohol. She reports that she does not use drugs.  No Known Allergies  Family History  Problem Relation Age of Onset   Heart disease Mother    Hypertension Mother    Heart disease Father     Hypertension Father    Cancer Brother     Prior to Admission medications   Medication Sig Start Date End Date Taking? Authorizing Provider  acetaminophen (TYLENOL) 500 MG tablet Take 500 mg by mouth every 6 (six) hours as needed for mild pain.   Yes [provider]  albuterol (VENTOLIN HFA) 108 (90 Base) MCG/ACT inhaler TAKE 2 PUFFS BY MOUTH EVERY 6 HOURS AS NEEDED FOR WHEEZE OR SHORTNESS OF BREATH Patient taking differently: Inhale 2 puffs into the lungs every 6 (six) hours as needed for shortness of breath or wheezing. 05/24/23  Yes [name], MD  apixaban (ELIQUIS) 5 MG TABS tablet Take 2 tablets (10 mg total) by mouth 2 (two) times daily for 4 days, THEN 1 tablet (5 mg total) 2 (two) times daily. 05/24/23 06/27/23 Yes Arrien, York Ram, MD  aspirin EC 81 MG tablet Take 81 mg by mouth daily. Swallow whole.   Yes [provider]  atorvastatin (LIPITOR) 80 MG tablet TAKE 1 TABLET BY MOUTH EVERY DAY 05/19/23  Yes [name], MD  busPIRone (BUSPAR) 10 MG tablet TAKE 1 TABLET BY MOUTH THREE TIMES A DAY 05/19/23  Yes [name], MD  carvedilol (COREG) 6.25 MG tablet Take 1 tablet (6.25 mg total) by mouth 2 (two) times daily with a meal. 05/09/23 06/08/23 Yes Ghimire, Lyndel Safe, MD  Cholecalciferol (VITAMIN D3) 250 MCG (10000 UT) TABS Take 10,000 Units by mouth daily at 6 (six) AM. 05/09/23  Yes Ghimire, Lyndel Safe, MD  clonazePAM (KLONOPIN) 0.5 MG tablet Take 1 tablet (0.5 mg total) by mouth every 8 (eight) hours as needed for up to 5 days for anxiety (as needed for anxiety). 05/24/23 06/05/23 Yes Arrien, York Ram, MD  docusate sodium (COLACE) 100 MG capsule Take 1 capsule (100 mg total) by mouth 2 (two) times daily. 05/09/23  Yes Dorcas Carrow, MD  ferrous sulfate 325 (65 FE) MG tablet Take 1 tablet (325 mg total) by mouth 3 (three) times daily after meals. 05/09/23  Yes Dorcas Carrow, MD  hydrALAZINE (APRESOLINE) 25 MG tablet Take 1 tablet (25 mg total) by mouth every 8 (eight) hours.  05/24/23 06/23/23 Yes Arrien, York Ram, MD  HYDROcodone-acetaminophen [facility]) 5-325 MG tablet Take 1-2 tablets by mouth every 4 (four) hours as needed for severe pain or moderate pain. 1 tablets for moderate pain, 2 tablet for severe pain 05/24/23  Yes Arrien, York Ram, MD  ipratropium (ATROVENT HFA) 17 MCG/ACT inhaler Inhale 2 puffs into the lungs every 6 (six) hours as needed for wheezing.   Yes [provider]  isosorbide mononitrate (IMDUR) 30 MG 24 hr tablet Take 1 tablet (30 mg total) by mouth daily. 05/25/23 06/24/23 Yes Arrien, York Ram, MD  JARDIANCE 25 MG TABS tablet TAKE 1 TABLET (25 MG TOTAL) BY MOUTH DAILY. 05/24/23  Yes [name], MD  levofloxacin (LEVAQUIN) 750 MG tablet Take 750 mg by mouth daily.   Yes [provider]  magnesium oxide (MAG-OX) 400 (  240 Mg) MG tablet Take 1 tablet (400 mg total) by mouth daily. 04/28/23  Yes [name], MD  melatonin 5 MG TABS Take 1 tablet (5 mg total) by mouth at bedtime. 02/24/23  Yes Angiulli, Mcarthur Rossetti, PA-C  Multiple Vitamin (MULTIVITAMIN ADULT PO) Take 1 tablet by mouth daily.   Yes [provider]  Omega 3 1000 MG CAPS Take 2 capsules by mouth in the morning and at bedtime.   Yes [provider]  pantoprazole (PROTONIX) 40 MG tablet Take 1 tablet (40 mg total) by mouth daily. 04/26/23  Yes [name], MD  polyethylene glycol (MIRALAX / GLYCOLAX) 17 g packet Take 34 g by mouth daily. 05/09/23  Yes Dorcas Carrow, MD  torsemide 40 MG TABS Take 40 mg by mouth 2 (two) times daily. 05/24/23 06/23/23 Yes Arrien, York Ram, MD  umeclidinium-vilanterol [facility] ELLIPTA) 62.5-25 MCG/ACT AEPB Inhale 1 puff into the lungs daily. 04/26/23  Yes [name], MD  albuterol (PROVENTIL) (2.5 MG/3ML) 0.083% nebulizer solution Take 2.5 mg by nebulization every 6 (six) hours as needed for wheezing or shortness of breath. Patient not taking: Reported on 06/05/2023    [provider]   Cetirizine-Pseudoephedrine (ALLERGY RELIEF D PO) Take 1 tablet by mouth daily. Patient not taking: Reported on 06/05/2023    [provider]  enoxaparin (LOVENOX) 40 MG/0.4ML injection Inject 0.3 mLs (30 mg total) into the skin daily for 25 days. For DVT prophylaxis after surgery Patient not taking: Reported on 06/05/2023 05/09/23 06/03/23  Dorcas Carrow, MD  nitroGLYCERIN (NITROSTAT) 0.4 MG SL tablet Place 1 tablet (0.4 mg total) under the tongue every 5 (five) minutes as needed for chest pain. Patient not taking: Reported on 06/05/2023 02/24/23   Charlton Amor, PA-C    Physical Exam: Vitals:   06/05/23 1632 06/05/23 1745 06/05/23 1937 06/05/23 2035  BP: 124/66 117/70 121/68   Pulse: 91 87 85   Resp: (!) 27 (!) 24 16   Temp: 97.9 F (36.6 C)  (!) 97.4 F (36.3 C)   TempSrc: Oral  Oral   SpO2: 100% 100% 100%   Weight:    76.8 kg  Height:    5\' 6"  (1.676 m)   General:  Appears calm and comfortable and is in NAD, chronically ill Eyes:   EOMI, normal lids, iris ENT:  grossly normal hearing, lips & tongue, mmm; artificial dentition Neck:  no LAD, masses or thyromegaly Cardiovascular:  RRR, no m/r/g. No LE edema.  Respiratory:   Diffuse rhonchi.  Mildly increased respiratory effort on 4L [place] O2. Abdomen:  soft, NT, ND Skin:  no rash or induration seen on limited exam Musculoskeletal:  grossly normal tone BUE/BLE, good ROM, no bony abnormality Psychiatric:  blunted mood and affect, speech fluent and appropriate, AOx3 Neurologic:  CN 2-12 grossly intact, moves all extremities in coordinated fashion   Radiological Exams on Admission: Independently reviewed - see discussion in A/P where applicable  DG CHEST PORT 1 VIEW  Result Date: 06/05/2023 CLINICAL DATA:  Cough EXAM: PORTABLE CHEST 1 VIEW COMPARISON:  05/20/2023 FINDINGS: Lungs are clear. No pneumothorax or pleural effusion. Stable mild cardiomegaly. Hilar enlargement bilaterally relates to central pulmonary arterial  enlargement better seen on CT examination 05/20/2023. No overt pulmonary edema. No acute bone abnormality. IMPRESSION: 1. No active disease. 2. Cardiomegaly and pulmonary arterial hypertension. Electronically Signed   By: Helyn Numbers M.D.   On: 06/05/2023 18:35    EKG: Independently reviewed.  NSR with rate 92; prolonged QTc  524; RBBB, LVH, and IVCD   Labs on Admission: I have personally reviewed the available labs and imaging studies at the time of the admission.  Pertinent labs:    Glucose 217 BUN 62/Creatinine 1.59/GFR 33 - stable WBC 9.3 Hgb 9.8 - stable Heme positive   Assessment and Plan: Principal Problem:   Acute lower GI bleeding Active Problems:   Acute pulmonary embolism (HCC)   Chronic systolic CHF (congestive heart failure) (HCC)   History of CAD (coronary artery disease)   History of hemiarthroplasty of right hip   COPD (chronic obstructive pulmonary disease) (HCC)   GAD (generalized anxiety disorder)   Hypertension   Type 2 diabetes mellitus with complication, without long-term current use of insulin (HCC)   Chronic kidney disease, stage 3b (HCC)   DNR (do not resuscitate)    Lower GI Bleeding -Differential to include bleeding hemorrhoids; bacterial infectious colitis with likely pathogen Campylobacter jejuni; colonic diverticula; and AVM - but most likely etiology is diverticular bleeding vs. Hemorrhoidal bleeding -She is afebrile at this time with tachycardia, and no leukocytosis; will not give antibiotics at this time.  -Will place in observation  -Continue to monitor for recurrent bleeding -If significant bleeding, a tagged RBC scan vs. CTA could be considered to try to determine the most likely source of the bleeding  -GI consult has been requested, they will see in AM -Hold Eliquis for now -If unable to resume Eliquis, will need to consider IVC filter -Hgb is stable at this time; recheck in AM (or sooner if more active bleeding occurs) -Clear liquids,  NPO after MN for now  Recent pulmonary embolism  -Admitted 9/20-24 for this issue -Hold Eliquis overnight -Consider IVC filter if unable to resume Eliquis soon  Chronic systolic CHF -05/21/23 echo with EF 20-25%, global hypokinesis, moderate MR, moderate pulm HTN -Volume overload has been a recurrent challenge for her -Appears to be compensated at this time clinically and also on CXR -02 saturation is 100% on 4 L/min per [place] - which appears to be at/near her baseline -Continue carvedilol plus hydralazine and isosorbide for afterload reduction.  -Hold Jardiance as inpatient -Hold diuresis with torsemide for now -Holding on RAAS inhibition for now due to unstable GFR.  -Patient likely not candidate for invasive procedures, for her mitral valve.    Stage 3b CKD -Appears to be stable at this time -Attempt to avoid nephrotoxic medications -Recheck BMP in AM    History of CAD (coronary artery disease) -Status post stent angioplasty -Hold ASA in the setting of GI bleeding -Continue carvedilol and atorvastatin  HTN -Continue hydralazine, carvedilol   History of hemiarthroplasty of right hip -Status post right hip hemiarthroplasty which was done on 05/03/23 for hip fracture -In SNF rehab - but her son reports that they haven't been getting her out of bed to go to the bathroom -PT and OT consults requested   COPD (chronic obstructive pulmonary disease) (HCC) -On 2-4 L home O2, currently 100% on 4L -She does have a coarse cough, was diagnosed with PNA at her facility -CXR shows no evidence of PNA at this time -Hold antibiotics -Will give standing Duonebs and PRN albuterol -Continue Anoro -RT consult   GAD (generalized anxiety disorder) -Continue buspirone, Klonopin  DM -Recent A1c was 6.6 -Not on home medications -Will order moderate-scale SSI  DNR -I have discussed code status with the patient and she would not desire resuscitation and would prefer to die a natural death  should that situation arise. -She  will need a gold out of facility DNR form at the time of discharge   -Palliative care consulted during her last hospitalization; she appears likely to benefit from ongoing palliative care discussions, will order      Advance Care Planning:   Code Status: Limited: Do not attempt resuscitation (DNR) -DNR-LIMITED -Do Not Intubate/DNI    Consults: GI; Palliative care; PT/OT; TOC team; RT  DVT Prophylaxis: SCDs  Family Communication: None present; I called and spoke with her son by telephone  Severity of Illness: The appropriate patient status for this patient is OBSERVATION. Observation status is judged to be reasonable and necessary in order to provide the required intensity of service to ensure the patient's safety. The patient's presenting symptoms, physical exam findings, and initial radiographic and laboratory data in the context of their medical condition is felt to place them at decreased risk for further clinical deterioration. Furthermore, it is anticipated that the patient will be medically stable for discharge from the hospital within 2 midnights of admission.   Author: Jonah Blue, MD 06/05/2023 9:21 PM  For on call review [email or web address].

## 2023-06-05 NOTE — ED Triage Notes (Signed)
BIBA from [facility] for blood in stool. Hx of COPD, PNE on 4 lpm, right hip replacement 3 weeks ago. 112/72 BP 93% 4 lpm 84 HR

## 2023-06-05 NOTE — ED Notes (Signed)
ED TO INPATIENT HANDOFF REPORT  Name/Age/Gender Angela Lucero 82 y.o. female  Code Status    Code Status Orders  (From admission, onward)           Start     Ordered   06/05/23 1612  Do not attempt resuscitation (DNR)- Limited -Do Not Intubate (DNI)  Continuous       Question Answer Comment  If pulseless and not breathing No CPR or chest compressions.   In Pre-Arrest Conditions (Patient Is Breathing and Has A Pulse) Do not intubate. Provide all appropriate non-invasive medical interventions. Avoid ICU transfer unless indicated or required.   Consent: Discussion documented in EHR or advanced directives reviewed      06/05/23 1613           Code Status History     Date Active Date Inactive Code Status Order ID Comments User Context   05/20/2023 1640 05/24/2023 1609 Do not attempt resuscitation (DNR) - Comfort care [id number]  Lucile Shutters, MD ED   05/01/2023 [phone] 05/09/2023 1740 Limited: Do not attempt resuscitation (DNR) -DNR-LIMITED -Do Not Intubate/DNI  [id number]  Miguel Rota, MD Inpatient   05/01/2023 0408 05/01/2023 0938 Do not attempt resuscitation (DNR) - Comfort care [id number]  Tereasa Coop, MD Inpatient   02/10/2023 1305 02/25/2023 1517 DNR [id number]  Charlton Amor, PA-C Inpatient   02/10/2023 1305 02/10/2023 1305 Full Code [id number]  Charlton Amor, PA-C Inpatient   02/10/2023 1305 02/10/2023 1305 DNR [id number]  Charlton Amor, PA-C Inpatient   02/04/2023 1540 02/10/2023 1256 DNR [id number]  Modena Slater, DO ED   12/14/2021 1829 12/18/2021 1641 Full Code [id number]  End, Cristal Deer, MD Inpatient       Home/SNF/Other Nursing Home  Chief Complaint Acute lower GI bleeding [K92.2]  Level of Care/Admitting Diagnosis ED Disposition     ED Disposition  Admit   Condition  --   Comment  Hospital Area: [facility] [place] [facility] [[id number]]  Level of Care: Med-Surg [16]  May place patient in observation at [facility] or [facility] if equivalent level of care is  available:: Yes  Covid Evaluation: Asymptomatic - no recent exposure (last 10 days) testing not required  Diagnosis: Acute lower GI bleeding [737106]  Admitting Physician: Jonah Blue [[id number]]  Attending Physician: Jonah Blue [[id number]]          Medical History Past Medical History:  Diagnosis Date   Anemia 02/13/2023   Anxiety disorder, unspecified 03/16/2023   Arthritis 2010   Lower back and neck, feet   Chronic hypoxic respiratory failure (HCC) 05/01/2023   Chronic kidney disease (CKD), stage III (moderate) (HCC) 05/01/2023   Cognitive communication deficit 03/16/2023   Congestive heart failure (CHF) (HCC) 12/10/2021   COPD (chronic obstructive pulmonary disease) (HCC)    Coronary artery disease PTCA and stenting in 1997 and 2005 in [place] 12/10/2021   CVA (cerebral vascular accident) (HCC)    1997, 2005 stents   Debility 02/10/2023   Diabetes mellitus, type II (HCC)    Displaced fracture of right femoral neck (HCC) 05/01/2023   Dyslipidemia associated with type 2 diabetes mellitus (HCC) 02/01/2018   Dysphagia, oropharyngeal phase 03/16/2023   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   Type 2 diabetes mellitus with complication, without long-term current use of insulin (HCC) 02/01/2018    Allergies No Known Allergies  IV Location/Drains/Wounds Patient Lines/Drains/Airways Status     Active Line/Drains/Airways     Name Placement date Placement time Site Days   External Urinary  Catheter 06/05/23  1536  --  less than 1            Labs/Imaging Results for orders placed or performed during the hospital encounter of 06/05/23 (from the past 48 hour(s))  CBC with Differential     Status: Abnormal   Collection Time: 06/05/23  1:55 PM  Result Value Ref Range   WBC 9.3 4.0 - 10.5 K/uL   RBC 3.61 (L) 3.87 - 5.11 MIL/uL   Hemoglobin 9.8 (L) 12.0 - 15.0 g/dL   HCT 54.0 (L) 98.1 - 19.1 %   MCV 92.5 80.0 - 100.0 fL   MCH 27.1 26.0 - 34.0 pg   MCHC 29.3 (L) 30.0 - 36.0 g/dL   RDW 47.8 (H) 29.5 - 62.1 %   Platelets 268 150 - 400 K/uL   nRBC 0.0 0.0 - 0.2 %   Neutrophils Relative % 84 %   Neutro Abs 7.7 1.7 - 7.7 K/uL   Lymphocytes Relative 7 %   Lymphs Abs 0.7 0.7 - 4.0 K/uL   Monocytes Relative 9 %   Monocytes Absolute 0.9 0.1 - 1.0 K/uL   Eosinophils Relative 0 %   Eosinophils Absolute 0.0 0.0 - 0.5 K/uL   Basophils Relative 0 %   Basophils Absolute 0.0 0.0 - 0.1 K/uL   Immature Granulocytes 0 %   Abs Immature Granulocytes 0.04 0.00 - 0.07 K/uL    Comment: Performed at [facility], [address], [place], [place] [address]  Comprehensive metabolic panel     Status: Abnormal   Collection Time: 06/05/23  1:55 PM  Result Value Ref Range   Sodium 135 135 - 145 mmol/L   Potassium 3.5 3.5 - 5.1 mmol/L   Chloride 93 (L) 98 - 111 mmol/L   CO2 32 22 - 32 mmol/L   Glucose, Bld 217 (H) 70 - 99 mg/dL    Comment: Glucose reference range applies only to samples taken after fasting for at least 8 hours.   BUN 62 (H) 8 - 23 mg/dL   Creatinine, Ser 7.84 (H) 0.44 - 1.00 mg/dL   Calcium 9.0 8.9 - 69.6 mg/dL   Total Protein 7.3 6.5 - 8.1 g/dL   Albumin 3.2 (L) 3.5 - 5.0 g/dL   AST 12 (L) 15 - 41 U/L   ALT 11 0 - 44 U/L   Alkaline Phosphatase 67 38 - 126 U/L   Total Bilirubin 0.4 0.3 - 1.2 mg/dL   GFR, Estimated 33 (L) >60 mL/min    Comment: (NOTE) Calculated using the CKD-EPI Creatinine Equation (2021)    Anion gap 10 5 - 15    Comment:  Performed at [facility], [address], [place], [place] [address]  Type and screen [facility]     Status: None   Collection Time: 06/05/23  1:55 PM  Result Value Ref Range   ABO/RH(D) A POS    Antibody Screen NEG    Sample Expiration      06/08/2023,2359 Performed at [facility],  [address], [place], [place] [address]   I-stat chem 8, ED (not at [facility], [facility] or [facility])     Status: Abnormal   Collection Time: 06/05/23  2:06 PM  Result Value Ref Range   Sodium 136 135 - 145 mmol/L   Potassium 3.7 3.5 - 5.1 mmol/L   Chloride 93 (L) 98 - 111 mmol/L   BUN 57 (H) 8 - 23 mg/dL   Creatinine, Ser 6.04 (H) 0.44 - 1.00 mg/dL   Glucose, Bld 540 (H) 70 - 99 mg/dL    Comment: Glucose reference range applies only to samples taken after fasting for at least 8 hours.   Calcium, Ion 1.17 1.15 - 1.40 mmol/L   TCO2 33 (H) 22 - 32 mmol/L   Hemoglobin 11.6 (L) 12.0 - 15.0 g/dL   HCT 98.1 (L) 19.1 - 47.8 %  POC occult blood, ED Provider will collect     Status: Abnormal   Collection Time: 06/05/23  2:11 PM  Result Value Ref Range   Fecal Occult Bld POSITIVE (A) NEGATIVE   No results found.  Pending Labs Unresulted Labs (From admission, onward)     Start     Ordered   06/06/23 0500  Basic metabolic panel  Tomorrow morning,   R        06/05/23 1613   06/06/23 0500  CBC  Tomorrow morning,   R        06/05/23 1613   06/05/23 1612  Expectorated Sputum Assessment w Gram Stain, Rflx to Resp Cult  (COPD / Pneumonia / Cellulitis / Lower Extremity Wound)  Once,   R        06/05/23 1613            Vitals/Pain Today's Vitals   06/05/23 1420 06/05/23 1505 06/05/23 1615 06/05/23 1632  BP:  132/66 127/67 124/66  Pulse:  93 88 91  Resp:  17 (!) 27 (!) 27  Temp:    97.9 F (36.6 C)  TempSrc:    Oral  SpO2:  100% 100% 100%  Weight: 76 kg     Height: 5\' 6"  (1.676 m)       Isolation Precautions No active  isolations  Medications Medications  HYDROcodone-acetaminophen (NORCO/VICODIN) 5-325 MG per tablet 1-2 tablet (has no administration in time range)  atorvastatin (LIPITOR) tablet 80 mg (has no administration in time range)  carvedilol (COREG) tablet 6.25 mg (has no administration in time range)  hydrALAZINE (APRESOLINE) tablet 25 mg (has no administration in time range)  isosorbide mononitrate (IMDUR) 24 hr tablet 30 mg (has no administration in time range)  busPIRone (BUSPAR) tablet 10 mg (has no administration in time range)  docusate sodium (COLACE) capsule 100 mg (has no administration in time range)  pantoprazole (PROTONIX) EC tablet 40 mg (has no administration in time range)  polyethylene glycol (MIRALAX / GLYCOLAX) packet 34 g (has no administration in time range)  ferrous sulfate tablet 325 mg (has no administration in time range)  melatonin tablet 5 mg (has no administration in time range)  clonazePAM (KLONOPIN) tablet 0.5 mg (has no administration in time range)  magnesium oxide (MAG-OX) tablet 400 mg (has no administration in time range)  umeclidinium-vilanterol (ANORO ELLIPTA) 62.5-25 MCG/ACT 1 puff (has no administration in time range)  lactated ringers infusion (has no administration in time range)  acetaminophen (TYLENOL) tablet 650 mg (has no administration in time range)    Or  acetaminophen (TYLENOL) suppository 650 mg (has no administration in time range)  morphine (PF) 2 MG/ML injection 2 mg (has no administration in time range)  ondansetron (ZOFRAN) tablet 4 mg (has no administration in time range)    Or  ondansetron (ZOFRAN) injection 4 mg (has no administration in time range)  hydrALAZINE (APRESOLINE) injection 5 mg (has no administration in time range)  cefTRIAXone (ROCEPHIN) 2 g in sodium chloride 0.9 % 100 mL IVPB (has no administration in time range)  azithromycin (ZITHROMAX) 500 mg in sodium chloride 0.9 % 250 mL IVPB (has no administration in time range)   ipratropium-albuterol (DUONEB) 0.5-2.5 (3) MG/3ML nebulizer solution 3 mL (has no administration in time range)  albuterol (PROVENTIL) (2.5 MG/3ML) 0.083% nebulizer solution 2.5 mg (has no administration in time range)  sodium chloride 0.9 % bolus 500 mL (0 mLs Intravenous Stopped 06/05/23 1457)    Mobility walks with device

## 2023-06-06 DIAGNOSIS — E1122 Type 2 diabetes mellitus with diabetic chronic kidney disease: Secondary | ICD-10-CM | POA: Diagnosis present

## 2023-06-06 DIAGNOSIS — E785 Hyperlipidemia, unspecified: Secondary | ICD-10-CM | POA: Diagnosis present

## 2023-06-06 DIAGNOSIS — Z7401 Bed confinement status: Secondary | ICD-10-CM | POA: Diagnosis not present

## 2023-06-06 DIAGNOSIS — N1832 Chronic kidney disease, stage 3b: Secondary | ICD-10-CM | POA: Diagnosis present

## 2023-06-06 DIAGNOSIS — J189 Pneumonia, unspecified organism: Secondary | ICD-10-CM | POA: Diagnosis present

## 2023-06-06 DIAGNOSIS — D62 Acute posthemorrhagic anemia: Secondary | ICD-10-CM | POA: Diagnosis present

## 2023-06-06 DIAGNOSIS — R58 Hemorrhage, not elsewhere classified: Secondary | ICD-10-CM | POA: Diagnosis not present

## 2023-06-06 DIAGNOSIS — K5731 Diverticulosis of large intestine without perforation or abscess with bleeding: Secondary | ICD-10-CM | POA: Diagnosis present

## 2023-06-06 DIAGNOSIS — Z7982 Long term (current) use of aspirin: Secondary | ICD-10-CM | POA: Diagnosis not present

## 2023-06-06 DIAGNOSIS — K922 Gastrointestinal hemorrhage, unspecified: Secondary | ICD-10-CM | POA: Diagnosis not present

## 2023-06-06 DIAGNOSIS — Z7984 Long term (current) use of oral hypoglycemic drugs: Secondary | ICD-10-CM | POA: Diagnosis not present

## 2023-06-06 DIAGNOSIS — E1169 Type 2 diabetes mellitus with other specified complication: Secondary | ICD-10-CM | POA: Diagnosis present

## 2023-06-06 DIAGNOSIS — K921 Melena: Secondary | ICD-10-CM | POA: Diagnosis not present

## 2023-06-06 DIAGNOSIS — I2699 Other pulmonary embolism without acute cor pulmonale: Secondary | ICD-10-CM | POA: Diagnosis present

## 2023-06-06 DIAGNOSIS — R531 Weakness: Secondary | ICD-10-CM | POA: Diagnosis not present

## 2023-06-06 DIAGNOSIS — Z602 Problems related to living alone: Secondary | ICD-10-CM | POA: Diagnosis present

## 2023-06-06 DIAGNOSIS — Z7901 Long term (current) use of anticoagulants: Secondary | ICD-10-CM | POA: Diagnosis not present

## 2023-06-06 DIAGNOSIS — E876 Hypokalemia: Secondary | ICD-10-CM | POA: Diagnosis present

## 2023-06-06 DIAGNOSIS — K625 Hemorrhage of anus and rectum: Secondary | ICD-10-CM | POA: Diagnosis present

## 2023-06-06 DIAGNOSIS — F411 Generalized anxiety disorder: Secondary | ICD-10-CM | POA: Diagnosis present

## 2023-06-06 DIAGNOSIS — R195 Other fecal abnormalities: Secondary | ICD-10-CM | POA: Diagnosis not present

## 2023-06-06 DIAGNOSIS — I340 Nonrheumatic mitral (valve) insufficiency: Secondary | ICD-10-CM | POA: Diagnosis present

## 2023-06-06 DIAGNOSIS — J440 Chronic obstructive pulmonary disease with acute lower respiratory infection: Secondary | ICD-10-CM | POA: Diagnosis present

## 2023-06-06 DIAGNOSIS — Z23 Encounter for immunization: Secondary | ICD-10-CM | POA: Diagnosis present

## 2023-06-06 DIAGNOSIS — Z79899 Other long term (current) drug therapy: Secondary | ICD-10-CM | POA: Diagnosis not present

## 2023-06-06 DIAGNOSIS — I272 Pulmonary hypertension, unspecified: Secondary | ICD-10-CM | POA: Diagnosis present

## 2023-06-06 DIAGNOSIS — I2510 Atherosclerotic heart disease of native coronary artery without angina pectoris: Secondary | ICD-10-CM | POA: Diagnosis present

## 2023-06-06 DIAGNOSIS — Z66 Do not resuscitate: Secondary | ICD-10-CM | POA: Diagnosis present

## 2023-06-06 DIAGNOSIS — I5022 Chronic systolic (congestive) heart failure: Secondary | ICD-10-CM | POA: Diagnosis present

## 2023-06-06 DIAGNOSIS — I130 Hypertensive heart and chronic kidney disease with heart failure and stage 1 through stage 4 chronic kidney disease, or unspecified chronic kidney disease: Secondary | ICD-10-CM | POA: Diagnosis present

## 2023-06-06 DIAGNOSIS — J9611 Chronic respiratory failure with hypoxia: Secondary | ICD-10-CM | POA: Diagnosis present

## 2023-06-06 LAB — GLUCOSE, CAPILLARY
Glucose-Capillary: 148 mg/dL — ABNORMAL HIGH (ref 70–99)
Glucose-Capillary: 152 mg/dL — ABNORMAL HIGH (ref 70–99)
Glucose-Capillary: 156 mg/dL — ABNORMAL HIGH (ref 70–99)
Glucose-Capillary: 168 mg/dL — ABNORMAL HIGH (ref 70–99)
Glucose-Capillary: 240 mg/dL — ABNORMAL HIGH (ref 70–99)
Glucose-Capillary: 270 mg/dL — ABNORMAL HIGH (ref 70–99)

## 2023-06-06 LAB — BASIC METABOLIC PANEL
Anion gap: 10 (ref 5–15)
BUN: 63 mg/dL — ABNORMAL HIGH (ref 8–23)
CO2: 29 mmol/L (ref 22–32)
Calcium: 8.5 mg/dL — ABNORMAL LOW (ref 8.9–10.3)
Chloride: 96 mmol/L — ABNORMAL LOW (ref 98–111)
Creatinine, Ser: 1.32 mg/dL — ABNORMAL HIGH (ref 0.44–1.00)
GFR, Estimated: 41 mL/min — ABNORMAL LOW (ref >60)
Glucose, Bld: 166 mg/dL — ABNORMAL HIGH (ref 70–99)
Potassium: 3 mmol/L — ABNORMAL LOW (ref 3.5–5.1)
Sodium: 135 mmol/L (ref 135–145)

## 2023-06-06 LAB — CBC
HCT: 29.3 % — ABNORMAL LOW (ref 36.0–46.0)
Hemoglobin: 8.6 g/dL — ABNORMAL LOW (ref 12.0–15.0)
MCH: 27.2 pg (ref 26.0–34.0)
MCHC: 29.4 g/dL — ABNORMAL LOW (ref 30.0–36.0)
MCV: 92.7 fL (ref 80.0–100.0)
Platelets: 211 10*3/uL (ref 150–400)
RBC: 3.16 MIL/uL — ABNORMAL LOW (ref 3.87–5.11)
RDW: 19.1 % — ABNORMAL HIGH (ref 11.5–15.5)
WBC: 7.4 10*3/uL (ref 4.0–10.5)
nRBC: 0 % (ref 0.0–0.2)

## 2023-06-06 LAB — HEPARIN LEVEL (UNFRACTIONATED): Heparin Unfractionated: >1.1 [IU]/mL — ABNORMAL HIGH (ref 0.30–0.70)

## 2023-06-06 LAB — APTT: aPTT: 110 s — ABNORMAL HIGH (ref 24–36)

## 2023-06-06 MED ORDER — POTASSIUM CHLORIDE CRYS ER 20 MEQ PO TBCR
60.0000 meq | EXTENDED_RELEASE_TABLET | ORAL | Status: AC
  Administered 2023-06-06 (×2): 60 meq via ORAL
  Filled 2023-06-06 (×2): qty 3

## 2023-06-06 MED ORDER — HEPARIN (PORCINE) 25000 UT/250ML-% IV SOLN
1100.0000 [IU]/h | INTRAVENOUS | Status: DC
  Administered 2023-06-06: 1200 [IU]/h via INTRAVENOUS
  Administered 2023-06-07: 1100 [IU]/h via INTRAVENOUS
  Filled 2023-06-06 (×2): qty 250

## 2023-06-06 MED ORDER — SALINE SPRAY 0.65 % NA SOLN
1.0000 | NASAL | Status: DC | PRN
  Filled 2023-06-06: qty 44

## 2023-06-06 NOTE — Consult Note (Signed)
[facility] Consult  Referring Provider: [facility] hospitalist/Dr. Kevan Ny Primary Care Physician:  [name], MD Primary Gastroenterologist: Gentry Fitz  Reason for Consultation: Blood in stool  HPI: Angela Lucero is a 82 y.o. female was transferred from [facility] with blood in stool. Patient cannot elaborate if blood in stool was bright red or black, small/moderate/large in amount. She states she did not have any abdominal pain, nausea or vomiting. Patient denies prior episode of rectal bleeding. She states she has had colonoscopies in [place], more than 10 years ago, is unclear about indication or findings.  She has history of COPD and is on at least 2 to 4 L oxygen, 24 hours a day. She recently had a hip fracture and needed total hip arthroplasty on 05/03/2023. She recently had small nonocclusive pulmonary embolism and bilateral distal segment and proximal segment noted on CT angio from 05/20/2023 and was started on Eliquis.   Patient denies heartburn, acid reflux, difficulty swallowing, pain on swallowing. She reports generalized loss of appetite although she is unclear about weight loss. She denies change in bowel habits. She reports limited mobility, requiring assistance with ambulation.  Past Medical History:  Diagnosis Date   Anemia 02/13/2023   Anxiety disorder, unspecified 03/16/2023   Arthritis 2010   Lower back and neck, feet   Chronic hypoxic respiratory failure (HCC) 05/01/2023   Chronic kidney disease (CKD), stage III (moderate) (HCC) 05/01/2023   Cognitive communication deficit 03/16/2023   Congestive heart failure (CHF) (HCC) 12/10/2021   COPD (chronic obstructive pulmonary disease) (HCC)    Coronary artery disease PTCA and stenting in 1997 and 2005 in [place] 12/10/2021   CVA (cerebral vascular accident) (HCC)    1997, 2005 stents   Debility 02/10/2023   Diabetes mellitus, type II (HCC)    Displaced fracture of right femoral neck  (HCC) 05/01/2023   Dyslipidemia associated with type 2 diabetes mellitus (HCC) 02/01/2018   Dysphagia, oropharyngeal phase 03/16/2023   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   Type 2 diabetes mellitus with complication, without long-term current use of insulin (HCC) 02/01/2018    Past Surgical History:  Procedure Laterality Date   APPENDECTOMY  1971   CHOLECYSTECTOMY     LEFT HEART CATH AND CORONARY ANGIOGRAPHY N/A 12/14/2021   Procedure: LEFT HEART CATH AND CORONARY ANGIOGRAPHY;  Surgeon: Yvonne Kendall, MD;  Location: [facility] INVASIVE [facility];  Service: Cardiovascular;  Laterality: N/A;   Navel hernia repair  2007   PARTIAL HYSTERECTOMY  1976   Stent Heart     TOTAL HIP ARTHROPLASTY Right 05/03/2023   Procedure: POSTERIOR HEMI HIP;  Surgeon: Teryl Lucy, MD;  Location: [facility] OR;  Service: Orthopedics;  Laterality: Right;   TUBAL LIGATION  1974   Ulna nerve transpost L arm      Prior to Admission medications   Medication Sig Start Date End Date Taking? Authorizing Provider  acetaminophen (TYLENOL) 500 MG tablet Take 500 mg by mouth every 6 (six) hours as needed for mild pain.   Yes [provider]  albuterol (VENTOLIN  HFA) 108 (90 Base) MCG/ACT inhaler TAKE 2 PUFFS BY MOUTH EVERY 6 HOURS AS NEEDED FOR WHEEZE OR SHORTNESS OF BREATH Patient taking differently: Inhale 2 puffs into the lungs every 6 (six) hours as needed for shortness of breath or wheezing. 05/24/23  Yes [name], MD  apixaban (ELIQUIS)  5 MG TABS tablet Take 2 tablets (10 mg total) by mouth 2 (two) times daily for 4 days, THEN 1 tablet (5 mg total) 2 (two) times daily. 05/24/23 06/27/23 Yes Arrien, York Ram, MD  aspirin EC 81 MG tablet Take 81 mg by mouth daily. Swallow whole.   Yes [provider]  atorvastatin (LIPITOR) 80 MG tablet TAKE 1 TABLET BY MOUTH EVERY DAY 05/19/23  Yes [name], MD  busPIRone (BUSPAR) 10 MG tablet TAKE 1 TABLET BY MOUTH THREE TIMES A DAY 05/19/23  Yes [name], MD  carvedilol (COREG) 6.25 MG tablet Take 1 tablet (6.25 mg total) by mouth 2 (two) times daily with a meal. 05/09/23 06/08/23 Yes Ghimire, Lyndel Safe, MD  Cholecalciferol (VITAMIN D3) 250 MCG (10000 UT) TABS Take 10,000 Units by mouth daily at 6 (six) AM. 05/09/23  Yes Ghimire, Lyndel Safe, MD  clonazePAM (KLONOPIN) 0.5 MG tablet Take 1 tablet (0.5 mg total) by mouth every 8 (eight) hours as needed for up to 5 days for anxiety (as needed for anxiety). 05/24/23 06/05/23 Yes Arrien, York Ram, MD  docusate sodium (COLACE) 100 MG capsule Take 1 capsule (100 mg total) by mouth 2 (two) times daily. 05/09/23  Yes Dorcas Carrow, MD  ferrous sulfate 325 (65 FE) MG tablet Take 1 tablet (325 mg total) by mouth 3 (three) times daily after meals. 05/09/23  Yes Dorcas Carrow, MD  hydrALAZINE (APRESOLINE) 25 MG tablet Take 1 tablet (25 mg total) by mouth every 8 (eight) hours. 05/24/23 06/23/23 Yes Arrien, York Ram, MD  HYDROcodone-acetaminophen [facility]) 5-325 MG tablet Take 1-2 tablets by mouth every 4 (four) hours as needed for severe pain or moderate pain. 1 tablets for moderate pain, 2 tablet for severe pain 05/24/23  Yes Arrien, York Ram, MD  ipratropium (ATROVENT HFA) 17 MCG/ACT inhaler Inhale 2 puffs into the lungs every 6 (six) hours as needed for wheezing.   Yes [provider]  isosorbide mononitrate (IMDUR) 30 MG 24 hr tablet Take 1 tablet (30 mg total) by mouth daily. 05/25/23 06/24/23 Yes Arrien, York Ram, MD  JARDIANCE  25 MG TABS tablet TAKE 1 TABLET (25 MG TOTAL) BY MOUTH DAILY. 05/24/23  Yes [name], MD  levofloxacin (LEVAQUIN) 750 MG tablet Take 750 mg by mouth daily.   Yes [provider]  magnesium oxide (MAG-OX) 400 (240 Mg) MG tablet Take 1 tablet (400 mg total) by mouth daily. 04/28/23  Yes [name], MD  melatonin 5 MG TABS Take 1 tablet (5 mg total) by mouth at bedtime. 02/24/23  Yes Angiulli, Mcarthur Rossetti, PA-C  Multiple Vitamin (MULTIVITAMIN ADULT PO) Take 1 tablet by mouth daily.   Yes [provider]  Omega 3 1000 MG CAPS Take 2 capsules by mouth in the morning and at bedtime.   Yes [provider]  pantoprazole (PROTONIX) 40 MG tablet Take 1 tablet (40 mg total) by mouth daily. 04/26/23  Yes [name], MD  polyethylene glycol (MIRALAX / GLYCOLAX) 17 g packet Take 34 g by mouth daily. 05/09/23  Yes Dorcas Carrow, MD  torsemide 40 MG TABS Take 40 mg by mouth 2 (two) times daily. 05/24/23 06/23/23 Yes Arrien, York Ram, MD  umeclidinium-vilanterol (ANORO ELLIPTA) 62.5-25 MCG/ACT AEPB Inhale 1 puff into the lungs daily. 04/26/23  Yes [name], MD  albuterol (PROVENTIL) (2.5 MG/3ML) 0.083% nebulizer solution Take 2.5 mg by nebulization every 6 (six) hours as needed for wheezing or shortness of breath. Patient not taking: Reported on 06/05/2023    [provider]  enoxaparin (LOVENOX) 40 MG/0.4ML injection Inject 0.3 mLs (30 mg total) into the skin daily for 25 days. For DVT prophylaxis after surgery Patient not taking: Reported on 06/05/2023 05/09/23 06/03/23  Dorcas Carrow, MD  nitroGLYCERIN (NITROSTAT) 0.4 MG SL tablet Place 1 tablet (0.4 mg total) under the tongue every 5 (five) minutes as needed for chest pain. Patient not taking: Reported on 06/05/2023 02/24/23   Angiulli, Mcarthur Rossetti, PA-C    Current Facility-Administered Medications  Medication Dose Route Frequency Provider Last Rate Last Admin   acetaminophen (TYLENOL) tablet 650 mg  650 mg Oral Q6H PRN Jonah Blue, MD       Or   acetaminophen (TYLENOL) suppository 650 mg  650 mg Rectal Q6H PRN Jonah Blue, MD       albuterol (PROVENTIL) (2.5 MG/3ML) 0.083% nebulizer solution 2.5 mg  2.5 mg Nebulization Q2H PRN Jonah Blue, MD       atorvastatin (LIPITOR) tablet 80 mg  80 mg Oral Daily Jonah Blue, MD   80 mg at 06/05/23 2047   azithromycin (ZITHROMAX) 500 mg in sodium chloride 0.9 % 250 mL IVPB  500 mg Intravenous Q24H Jonah Blue, MD 250 mL/hr at 06/05/23 2146 500 mg at 06/05/23 2146   busPIRone (BUSPAR) tablet 10 mg  10 mg Oral TID Jonah Blue, MD   10 mg at 06/05/23 2046   carvedilol (COREG) tablet 6.25 mg  6.25 mg Oral BID WC Jonah Blue, MD   6.25 mg at 06/06/23 0839   cefTRIAXone (ROCEPHIN) 2 g in sodium chloride 0.9 % 100 mL IVPB  2 g Intravenous Q24H Jonah Blue, MD 200 mL/hr at 06/05/23 2100 2 g at 06/05/23 2100   clonazePAM (KLONOPIN) tablet 0.5 mg  0.5 mg Oral Q8H PRN Jonah Blue, MD   0.5 mg at 06/06/23 0206   docusate sodium (COLACE) capsule 100 mg  100 mg Oral BID Jonah Blue, MD   100 mg at 06/05/23 2047   feeding supplement (BOOST / RESOURCE BREEZE) liquid 1 Container  1 Container Oral TID BM Jonah Blue, MD   1 Container at 06/05/23 2204   fentaNYL (SUBLIMAZE) injection 12.5-25 mcg  12.5-25 mcg Intravenous Q2H PRN Jonah Blue, MD       ferrous sulfate tablet 325 mg  325 mg Oral TID Jackelyn Knife, MD   325 mg at 06/06/23 0102   hydrALAZINE (APRESOLINE) injection 5 mg  5 mg Intravenous Q4H PRN Jonah Blue, MD       hydrALAZINE (APRESOLINE) tablet 25 mg  25 mg Oral Bobette Mo, MD   25 mg at 06/06/23 0557   HYDROcodone-acetaminophen (NORCO/VICODIN) 5-325 MG per tablet 1-2 tablet  1-2 tablet Oral Q4H PRN Jonah Blue, MD   2 tablet at 06/06/23 7253   influenza vaccine adjuvanted (FLUAD) injection 0.5 mL  0.5 mL Intramuscular Tomorrow-1000 Jonah Blue, MD       insulin aspart (novoLOG) injection 0-15 Units  0-15 Units  Subcutaneous TID WC Jonah Blue, MD       ipratropium-albuterol (DUONEB) 0.5-2.5 (3) MG/3ML nebulizer solution 3 mL  3 mL Nebulization Q6H Jonah Blue, MD   3 mL at 06/06/23 [phone]  isosorbide mononitrate (IMDUR) 24 hr tablet 30 mg  30 mg Oral Daily Jonah Blue, MD       magnesium oxide (MAG-OX) tablet 400 mg  400 mg Oral Daily Jonah Blue, MD       melatonin tablet 5 mg  5 mg Oral Noemi Chapel, MD   5 mg at 06/05/23 2201   ondansetron [facility]) tablet 4 mg  4 mg Oral Q6H PRN Jonah Blue, MD       Or   ondansetron [facility]) injection 4 mg  4 mg Intravenous Q6H PRN Jonah Blue, MD       pantoprazole (PROTONIX) EC tablet 40 mg  40 mg Oral Daily Jonah Blue, MD       polyethylene glycol (MIRALAX / GLYCOLAX) packet 34 g  34 g Oral Daily Jonah Blue, MD       sodium chloride (OCEAN) 0.65 % nasal spray 1 spray  1 spray Each Nare PRN Jerald Kief, MD       umeclidinium-vilanterol (ANORO ELLIPTA) 62.5-25 MCG/ACT 1 puff  1 puff Inhalation Daily Jonah Blue, MD   1 puff at 06/06/23 0746    Allergies as of 06/05/2023   (No Known Allergies)    Family History  Problem Relation Age of Onset   Heart disease Mother    Hypertension Mother    Heart disease Father    Hypertension Father    Cancer Brother     Social History   Socioeconomic History   Marital status: Widowed    Spouse name: Not on file   Number of children: Not on file   Years of education: Not on file   Highest education level: Not on file  Occupational History   Not on file  Tobacco Use   Smoking status: Former    Current packs/day: 0.00    Types: Cigarettes    Quit date: 01/2023    Years since quitting: 0.3   Smokeless tobacco: Never  Vaping Use   Vaping status: Never Used  Substance and Sexual Activity   Alcohol use: Not Currently   Drug use: Never   Sexual activity: Not Currently  Other Topics Concern   Not on file  Social History Narrative   Not on file   Social  Determinants of Health   Financial Resource Strain: Low Risk  (02/10/2023)   Overall Financial Resource Strain (CARDIA)    Difficulty of Paying Living Expenses: Not very hard  Food Insecurity: No Food Insecurity (06/05/2023)   Hunger Vital Sign    Worried About Running Out of Food in the Last Year: Never true    Ran Out of Food in the Last Year: Never true  Transportation Needs: No Transportation Needs (06/05/2023)   PRAPARE - Administrator, Civil Service (Medical): No    Lack of Transportation (Non-Medical): No  Physical Activity: Not on file  Stress: Not on file  Social Connections: Not on file  Intimate Partner Violence: Not At Risk (06/05/2023)   Humiliation, Afraid, Rape, and Kick questionnaire    Fear of Current or Ex-Partner: No    Emotionally Abused: No    Physically Abused: No    Sexually Abused: No    Review of Systems: As per HPI Physical Exam: Vital signs in last 24 hours: Temp:  [97.4 F (36.3 C)-97.9 F (36.6 C)] 97.9 F (36.6 C) (10/07 0825) Pulse Rate:  [74-93] 90 (10/07 0825) Resp:  [16-27] 20 (10/07 0825) BP: (110-132)/(60-70) 121/66 (10/07 0825) SpO2:  [98 %-  100 %] 98 % (10/07 0825) Weight:  [76 kg-76.8 kg] 76.8 kg (10/06 2035) Last BM Date : 06/05/23  General:   Alert,  Well-developed, overweight, pleasant and cooperative in NAD Head:  Normocephalic and atraumatic. Eyes:  Sclera clear, no icterus.  Mild pallor. Ears:  Normal auditory acuity. Nose:  No deformity, discharge,  or lesions. Mouth:  No deformity or lesions.  Oropharynx pink & moist. Neck:  Supple; no masses or thyromegaly. Lungs: On oxygen via nasal cannula. Heart:  Regular rate and rhythm; no murmurs, clicks, rubs,  or gallops. Extremities:  Without clubbing or edema. Neurologic:  Alert and  oriented x4;  grossly normal neurologically. Skin:  Intact without significant lesions or rashes. Psych:  Alert and cooperative. Normal mood and affect. Abdomen:  Soft, nontender and  nondistended. No masses, hepatosplenomegaly or hernias noted. Normal bowel sounds, without guarding, and without rebound.         Lab Results: Recent Labs    06/05/23 1355 06/05/23 1406 06/06/23 0413  WBC 9.3  --  7.4  HGB 9.8* 11.6* 8.6*  HCT 33.4* 34.0* 29.3*  PLT 268  --  211   BMET Recent Labs    06/05/23 1355 06/05/23 1406 06/06/23 0413  NA 135 136 135  K 3.5 3.7 3.0*  CL 93* 93* 96*  CO2 32  --  29  GLUCOSE 217* 212* 166*  BUN 62* 57* 63*  CREATININE 1.59* 1.90* 1.32*  CALCIUM 9.0  --  8.5*   LFT Recent Labs    06/05/23 1355  PROT 7.3  ALBUMIN 3.2*  AST 12*  ALT 11  ALKPHOS 67  BILITOT 0.4   PT/INR No results for input(s): "LABPROT", "INR" in the last 72 hours.  Studies/Results: DG CHEST PORT 1 VIEW  Result Date: 06/05/2023 CLINICAL DATA:  Cough EXAM: PORTABLE CHEST 1 VIEW COMPARISON:  05/20/2023 FINDINGS: Lungs are clear. No pneumothorax or pleural effusion. Stable mild cardiomegaly. Hilar enlargement bilaterally relates to central pulmonary arterial enlargement better seen on CT examination 05/20/2023. No overt pulmonary edema. No acute bone abnormality. IMPRESSION: 1. No active disease. 2. Cardiomegaly and pulmonary arterial hypertension. Electronically Signed   By: Helyn Numbers M.D.   On: 06/05/2023 18:35    Impression: Blood in stool, unclear if this is melena versus hematochezia BUN 63/creatinine 1.32 with GFR of 41 Hemoglobin 9.8 on admission subsequent hemoglobin 11.6 and 8.6, no blood transfusion given, baseline hemoglobin 9.6 on 05/21/2023 Recent bilateral pulmonary embolism, started on Eliquis from 05/20/2023 Recent right hip replacement on 05/03/2023   Multiple comorbidities: COPD on oxygen via nasal cannula 24 hours a day, stage III chronic kidney disease, chronic systolic CHF, coronary artery disease, hypertension, dyslipidemia, diabetes   Plan: Patient does not wish to undergo procedures such as colonoscopy. She is a DNR. CT abdomen  and pelvis with contrast 05/20/2023 showed 2 small adjacent diverticuli arising from distal duodenum, multiple diverticulosis in left colon. Clinical picture compatible with presumed diverticular bleeding. Recommend conservative management. Will start on clear liquid for a.m. and advance to full liquid for p.m.. Hold Eliquis for now. Start patient on IV heparin. If there is further evidence of bleeding, recommend NM GI blood loss scan to localize bleeding and possible IR guided management. If there is no further evidence of bleeding and hemoglobin remained stable, possibly resume Eliquis in 3 days. Discussed the same with the patient and her hospitalist Dr. Rhona Leavens.   LOS: 0 days   Kerin Salen, MD  06/06/2023, 11:00 AM

## 2023-06-06 NOTE — Progress Notes (Signed)
Progress Note   Patient: Angela Lucero ION:[id number] DOB: [birth year] DOA: 06/05/2023     0 DOS: the patient was seen and examined on 06/06/2023   Brief hospital course: 82 y.o. 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 She was last admitted here from 9/20-24 with acute PT and acute on chronic systolic CHF with AKI.  She was discharged on Eliquis; EF was 20-25%.  She underwent R hip replacement on 9/3.   She reports that she has had all kinds of recent problems culminating with being diagnosed with PNA at her facility [facility].  She has been coughing with wheezing and feeling more SOB and fatigued for the last few days.  Staff noticed BRBPR with a BM and sent her in for evaluation.   Assessment and Plan: Lower GI Bleeding -Afebrile, no leukocytosis -GI consulted. Recs to cont to hold eliquis and start trial of heparin  -If further bleed, rec for NM GI blood loss scan. If no bleed, then may resume eliquis in 3 days -recheck cbc in AM   Recent pulmonary embolism  -Admitted 9/20-24 for this issue -Hold Eliquis, now on heparin trial per GI -Consider IVC filter if unable to resume Eliquis   Chronic systolic CHF -05/21/23 echo with EF 20-25%, global hypokinesis, moderate MR, moderate pulm HTN -Volume overload has been a recurrent challenge for her -Appears to be compensated at this time clinically and also on CXR -02 saturation is 100% on 4 L/min per [place] - which appears to be at/near her baseline -Continue carvedilol plus hydralazine and isosorbide for afterload reduction.  -Hold Jardiance as inpatient -Hold diuresis with torsemide for now -Holding on RAAS inhibition for now due to unstable GFR.  -recheck bmet in AM   Stage 3b CKD -Appears to be stable at this time -Attempt to avoid nephrotoxic medications -Recheck BMP in AM    History of CAD (coronary artery disease) -Status post stent  angioplasty -Hold ASA in the setting of GI bleeding -Continue carvedilol and atorvastatin -denies chest pains   HTN -Continue hydralazine, carvedilol   History of hemiarthroplasty of right hip -Status post right hip hemiarthroplasty which was done on 05/03/23 for hip fracture -In SNF rehab - but her son reports that they haven't been getting her out of bed to go to the bathroom -PT/OT consulted   COPD (chronic obstructive pulmonary disease) (HCC) -On 2-4 L home O2, currently on 4-5L -She does have a coarse cough, was diagnosed with PNA at her facility -CXR at time of presentation reviewed. Clear with cardiomegaly and pulm HTN -Cont neb tx as needed   GAD (generalized anxiety disorder) -Continue buspirone, Klonopin   DM -Recent A1c was 6.6 -Not on home medications -Will order moderate-scale SSI  Hypokalemia -will replace -recheck bmet in AM   DNR -noted at time of presentation     Subjective: Without complaints this AM. Denies abd pain  Physical Exam: Vitals:   06/06/23 1225 06/06/23 1451 06/06/23 1532 06/06/23 1623  BP: (!) 109/56  122/65 103/61  Pulse: 71   77  Resp: 20   18  Temp: 97.6 F (36.4 C)   98 F (36.7 C)  TempSrc: Oral     SpO2: 99% 98%  99%  Weight:      Height:       General exam: Awake, laying in bed, in nad Respiratory system: Normal respiratory effort, no wheezing Cardiovascular  system: regular rate, s1, s2 Gastrointestinal system: Soft, nondistended, positive BS Central nervous system: CN2-12 grossly intact, strength intact Extremities: Perfused, no clubbing Skin: Normal skin turgor, no notable skin lesions seen Psychiatry: Mood normal // no visual hallucinations   Data Reviewed:  Labs reviewed: Na 135, K 3.0, Cr 1.32, WBC 7.4, Hgb 8.6, Plts 211  Family Communication: Pt in room, family not at bedside  Disposition: Status is: Observation The patient will require care spanning > 2 midnights and should be moved to inpatient because:  severity of illness  Planned Discharge Destination: Skilled nursing facility    Author: Rickey Barbara, MD 06/06/2023 6:11 PM  For on call review [email or web address].

## 2023-06-06 NOTE — Hospital Course (Signed)
82 y.o. 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 She was last admitted here from 9/20-24 with acute PT and acute on chronic systolic CHF with AKI.  She was discharged on Eliquis; EF was 20-25%.  She underwent R hip replacement on 9/3.   She reports that she has had all kinds of recent problems culminating with being diagnosed with PNA at her facility [facility].  She has been coughing with wheezing and feeling more SOB and fatigued for the last few days.  Staff noticed BRBPR with a BM and sent her in for evaluation.

## 2023-06-06 NOTE — Progress Notes (Signed)
OT Cancellation Note  Patient Details Name: Angela Lucero MRN: [id number] DOB: Jan 07, 1941   Cancelled Treatment:    Reason Eval/Treat Not Completed: Patient declined, no reason specified Reporting that she wanted to nap. OT to continue to follow and check back on 10/8. Rosalio Loud, MS Acute Rehabilitation Department Office# [phone] 06/06/2023, 11:20 AM

## 2023-06-06 NOTE — Progress Notes (Signed)
PHARMACY - ANTICOAGULATION CONSULT NOTE  Pharmacy Consult for heparin Indication:  Recent PE, apixaban on hold  No Known Allergies  Patient Measurements: Height: 5\' 6"  (167.6 cm) Weight: 76.8 kg (169 lb 5 oz) IBW/kg (Calculated) : 59.3 Heparin Dosing Weight: 75 kg  Vital Signs: Temp: 97.9 F (36.6 C) (10/07 0825) Temp Source: Oral (10/07 0825) BP: 121/66 (10/07 0825) Pulse Rate: 90 (10/07 0825)  Labs: Recent Labs    06/05/23 1355 06/05/23 1406 06/06/23 0413  HGB 9.8* 11.6* 8.6*  HCT 33.4* 34.0* 29.3*  PLT 268  --  211  CREATININE 1.59* 1.90* 1.32*    Estimated Creatinine Clearance: 35.6 mL/min (A) (by C-G formula based on SCr of 1.32 mg/dL (H)).   Medical History: Past Medical History:  Diagnosis Date   Anemia 02/13/2023   Anxiety disorder, unspecified 03/16/2023   Arthritis 2010   Lower back and neck, feet   Chronic hypoxic respiratory failure (HCC) 05/01/2023   Chronic kidney disease (CKD), stage III (moderate) (HCC) 05/01/2023   Cognitive communication deficit 03/16/2023   Congestive heart failure (CHF) (HCC) 12/10/2021   COPD (chronic obstructive pulmonary disease) (HCC)    Coronary artery disease PTCA and stenting in 1997 and 2005 in [place] 12/10/2021   CVA (cerebral vascular accident) (HCC)    1997, 2005 stents   Debility 02/10/2023   Diabetes mellitus, type II (HCC)    Displaced fracture of right femoral neck (HCC) 05/01/2023   Dyslipidemia associated with type 2 diabetes mellitus (HCC) 02/01/2018   Dysphagia, oropharyngeal phase 03/16/2023   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  rigid, progressive flat foot deformity of BOTH feet     GAD (generalized anxiety disorder) 03/16/2023   Gout 1998   Hypertension    Hyponatremia 02/04/2023   Melanotic stools 02/13/2023   Muscle wasting and atrophy, not elsewhere classified, multiple sites 03/16/2023   Nicotine dependence, cigarettes, uncomplicated  03/16/2023   Nonrheumatic mitral (valve) insufficiency 03/16/2023   Other nonthrombocytopenic purpura (HCC) 03/16/2023   Type 2 diabetes mellitus with complication, without long-term current use of insulin (HCC) 02/01/2018    Medications: Apixaban 5 mg PO BID -Last dose: 10/6 @ 09:00 prior to admission  Assessment: Pt is an 53 yoF presenting with bloody stools. PMH significant for recent admission in September 2024 when pt was found to have a PE and started on apixaban.   Apixaban held on admission, GI consulted. Planning to transition anticoagulation to heparin drip (with no bolus) and monitor for any additional signs of bleeding.   Today, 06/06/23 CBC: Hgb (8.6) low and decreased; Plt WNL SCr 1.32, CrCl ~35 mL/min  Baseline heparin level should be falsely elevated due to recent DOAC.  Goal of Therapy:  Heparin level 0.3-0.7 units/ml aPTT 66-102 seconds Monitor platelets by anticoagulation protocol: Yes   Plan:  No bolus Initiate heparin infusion at 1200 units/hr Check 8 hour APTT/heparin level. Once they correlate, can monitor using heparin level only CBC, aPTT/Heparin level daily Monitor for signs of bleeding  Cindi Carbon, PharmD 06/06/2023,11:54 AM

## 2023-06-06 NOTE — TOC Initial Note (Signed)
Transition of Care [facility]) - Initial/Assessment Note    Patient Details  Name: Angela Lucero MRN: [id number] Date of Birth: 20-Apr-1941  Transition [facility] CM/SW Contact:    Harriett Sine, RN Phone Number:[phone]  06/06/2023, 2:11 PM  Clinical Narrative:                 Pt from [facility] for Rehab. Pt has pcp. Pt had hip surgery in September 2024. Pt usually on 2-4L of O2 daily. Spoke with pt son Leotis Shames [phone] who states pt will be return to [facility] to complete rehab. No SDOH needs at this [facility] following  Expected Discharge Plan: Skilled Nursing Facility     Patient Goals and CMS Choice Patient states their goals for this hospitalization and ongoing recovery are:: none stated   Choice offered to / list presented to : Patient, Adult Children [place] ownership interest in [facility].provided to::  (none)    Expected Discharge Plan and Services In-house Referral: NA Discharge Planning Services: NA Post Acute Care Choice: Skilled Nursing Facility Living arrangements for the past 2 months: Skilled Nursing Facility                   DME Agency: NA Date DME Agency Contacted:  (NA)                Prior Living Arrangements/Services Living arrangements for the past 2 months: Skilled Nursing Facility Lives with:: Facility Resident Patient language and need for interpreter reviewed:: Yes Do you feel safe going back to the place where you live?: Yes      Need for Family Participation in Patient Care: Yes (Comment) Care giver support system in place?: Yes (comment)   Criminal Activity/Legal Involvement Pertinent to Current Situation/Hospitalization: No - Comment as needed  Activities of Daily Living   ADL Screening (condition at time of admission) Independently performs ADLs?: Yes (appropriate for developmental age) Is the patient deaf or have difficulty hearing?: Yes Does the patient have difficulty seeing, even when wearing glasses/contacts?:  No Does the patient have difficulty concentrating, remembering, or making decisions?: No  Permission Sought/Granted Permission sought to share information with : Facility Industrial/product designer granted to share information with : Yes, Verbal Permission Granted              Emotional Assessment Appearance:: Appears stated age Attitude/Demeanor/Rapport: Sedated Affect (typically observed): Calm Orientation: : Oriented to Self, Oriented to Place, Oriented to  Time, Oriented to Situation Alcohol / Substance Use: Other (comment) (quit smoking 4 months aog) Psych Involvement: No (comment)  Admission diagnosis:  Rectal bleeding [K62.5] Acute lower GI bleeding [K92.2] Patient Active Problem List   Diagnosis Date Noted   Acute lower GI bleeding 06/05/2023   Chronic kidney disease, stage 3b (HCC) 06/05/2023   DNR (do not resuscitate) 06/05/2023   Acute pulmonary embolism (HCC) 05/20/2023   UTI (urinary tract infection) 05/20/2023   History of hemiarthroplasty of right hip 05/20/2023   CAD (coronary artery disease)    CVA (cerebral vascular accident) (HCC)    Diabetes (HCC)    Diabetes mellitus, type II (HCC)    Heart attack (HCC)    Hypertension    Hypertriglyceridemia    Displaced fracture of right femoral neck (HCC) 05/01/2023   History of CAD (coronary artery disease) 05/01/2023   Chronic hypoxic respiratory failure (HCC) 05/01/2023   CKD stage 3 due to type 2 diabetes mellitus (HCC) 05/01/2023   Closed right hip fracture, initial encounter (  HCC) 05/01/2023   GAD (generalized anxiety disorder) 03/16/2023   Cognitive communication deficit 03/16/2023   Dysphagia, oropharyngeal phase 03/16/2023   Hyperglycemia, unspecified 03/16/2023   Hyperlipidemia 03/16/2023   Muscle wasting and atrophy, not elsewhere classified, multiple sites 03/16/2023   Nicotine dependence, cigarettes, uncomplicated 03/16/2023   Nonrheumatic mitral (valve) insufficiency 03/16/2023   Other  malaise 03/16/2023   Other nonthrombocytopenic purpura (HCC) 03/16/2023   Pneumonia, unspecified organism 03/16/2023   Anxiety disorder, unspecified 03/16/2023   Coping style affecting medical condition 02/23/2023   Melanotic stools 02/13/2023   Anemia 02/13/2023   Debility 02/10/2023   Acute kidney injury superimposed on chronic kidney disease (HCC) 02/06/2023   Acute on chronic hypoxic respiratory failure (HCC) 02/05/2023   COPD (chronic obstructive pulmonary disease) (HCC) 02/05/2023   Chronic systolic CHF (congestive heart failure) (HCC) 02/05/2023   COPD exacerbation (HCC) 02/05/2023   Acute hypoxic respiratory failure (HCC) 02/04/2023   Hyponatremia 02/04/2023   Elevated troponin 02/04/2023   Cardiomyopathy (HCC) 01/01/2022   Chronic edema 12/18/2021   Non-ST elevation (NSTEMI) myocardial infarction (HCC) 12/14/2021   Acute systolic heart failure (HCC) 12/14/2021   Coronary artery disease PTCA and stenting in 1997 and 2005 in [place] 12/10/2021   Acute exacerbation of CHF (congestive heart failure) (HCC) 12/10/2021   Smoking 12/10/2021   Dyslipidemia 12/10/2021   Preop cardiovascular exam 12/10/2021   Congestive heart failure (CHF) (HCC) 12/10/2021   Flat foot 02/01/2018   Dyslipidemia associated with type 2 diabetes mellitus (HCC) 02/01/2018   Arthritis of both feet 02/01/2018   Type 2 diabetes mellitus with complication, without long-term current use of insulin (HCC) 02/01/2018   Arthritis 2010   Gout 1998   PCP:  [name], MD Pharmacy:   [facility] [phone] - [place], [place] - [address] [place] [address] Phone: [phone] Fax: [phone]  [facility] - [place], [place] - [address] [facility] [address] [place] [address] [place] [address] Phone: [phone] Fax: [phone]  [facility] [address] [place] [address] Phone: [phone] Fax: [phone]     Social Determinants  of Health (SDOH) Social History: SDOH Screenings   Food Insecurity: No Food Insecurity (06/05/2023)  Housing: Low Risk  (06/05/2023)  Transportation Needs: No Transportation Needs (06/05/2023)  Utilities: Not At Risk (06/05/2023)  Alcohol Screen: Low Risk  (02/10/2023)  Financial Resource Strain: Low Risk  (02/10/2023)  Tobacco Use: Medium Risk (06/05/2023)   SDOH Interventions:     Readmission Risk Interventions     No data to display

## 2023-06-06 NOTE — Consult Note (Incomplete)
Consultation Note Date: 06/06/2023   Patient Name: Angela Lucero  DOB: September 06, 1940  MRN: [id number]  Age / Sex: 82 y.o., female  PCP: [name], MD Referring Physician: Jerald Kief, MD  Reason for Consultation: Establishing goals of care  HPI/Patient Profile: 82 y.o. female  with past medical history of COPD on home oxygen 2-4L, HFrEF, CAD, HTN, HLD, CKD stage 3b, DM, recent admission 9/20-9/24 with acute heart failure exacerbation, AKI, and PE admitted on 06/05/2023 with bloody stools.   Clinical Assessment and Goals of Care: ***  Primary Decision Maker {Primary Decision NWGNF:62130}    SUMMARY OF RECOMMENDATIONS   ***  Code Status/Advance Care Planning: {Palliative Code status:23503}   Symptom Management:  ***  Palliative Prophylaxis:  {Palliative Prophylaxis:21015}  Additional Recommendations (Limitations, Scope, Preferences): {Recommended Scope and Preferences:21019}  Psycho-social/Spiritual:  Desire for further Chaplaincy support:{YES NO:22349} Additional Recommendations: {PAL SOCIAL:21064}  Prognosis:  {Palliative Care Prognosis:23504}  Discharge Planning: {Palliative dispostion:23505}      Primary Diagnoses: Present on Admission:  Acute lower GI bleeding  Type 2 diabetes mellitus with complication, without long-term current use of insulin (HCC)  Hypertension  GAD (generalized anxiety disorder)  Acute pulmonary embolism (HCC)  COPD (chronic obstructive pulmonary disease) (HCC)  Chronic systolic CHF (congestive heart failure) (HCC)  Chronic kidney disease, stage 3b (HCC)  History of hemiarthroplasty of right hip  DNR (do not resuscitate)   I have reviewed the medical record, interviewed the patient and family, and examined the patient. The following aspects are pertinent.  Past Medical History:  Diagnosis Date   Anemia 02/13/2023   Anxiety disorder, unspecified  03/16/2023   Arthritis 2010   Lower back and neck, feet   Chronic hypoxic respiratory failure (HCC) 05/01/2023   Chronic kidney disease (CKD), stage III (moderate) (HCC) 05/01/2023   Cognitive communication deficit 03/16/2023   Congestive heart failure (CHF) (HCC) 12/10/2021   COPD (chronic obstructive pulmonary disease) (HCC)    Coronary artery disease PTCA and stenting in 1997 and 2005 in [place] 12/10/2021   CVA (cerebral vascular accident) (HCC)    1997, 2005 stents   Debility 02/10/2023   Diabetes mellitus, type II (HCC)    Displaced fracture of right femoral neck (HCC) 05/01/2023   Dyslipidemia associated with type 2 diabetes mellitus (HCC) 02/01/2018   Dysphagia, oropharyngeal phase 03/16/2023   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   Type 2 diabetes mellitus with complication, without long-term current use of insulin (HCC) 02/01/2018   Social History   Socioeconomic History   Marital status: Widowed    Spouse name: Not on  file   Number of children: Not on file   Years of education: Not on file   Highest education level: Not on file  Occupational History   Not on file  Tobacco Use   Smoking status: Former    Current packs/day: 0.00    Types: Cigarettes    Quit date: 01/2023    Years since quitting: 0.3   Smokeless tobacco: Never  Vaping Use   Vaping status: Never Used  Substance and Sexual Activity   Alcohol use: Not Currently   Drug use: Never   Sexual activity: Not Currently   Other Topics Concern   Not on file  Social History Narrative   Not on file   Social Determinants of Health   Financial Resource Strain: Low Risk  (02/10/2023)   Overall Financial Resource Strain (CARDIA)    Difficulty of Paying Living Expenses: Not very hard  Food Insecurity: No Food Insecurity (06/05/2023)   Hunger Vital Sign    Worried About Running Out of Food in the Last Year: Never true    Ran Out of Food in the Last Year: Never true  Transportation Needs: No Transportation Needs (06/05/2023)   PRAPARE - Administrator, Civil Service (Medical): No    Lack of Transportation (Non-Medical): No  Physical Activity: Not on file  Stress: Not on file  Social Connections: Not on file   Family History  Problem Relation Age of Onset   Heart disease Mother    Hypertension Mother    Heart disease Father    Hypertension Father    Cancer Brother    Scheduled Meds:  atorvastatin  80 mg Oral Daily   busPIRone  10 mg Oral TID   carvedilol  6.25 mg Oral BID WC   docusate sodium  100 mg Oral BID   feeding supplement  1 Container Oral TID BM   ferrous sulfate  325 mg Oral TID PC   hydrALAZINE  25 mg Oral Q8H   influenza vaccine adjuvanted  0.5 mL Intramuscular Tomorrow-1000   insulin aspart  0-15 Units Subcutaneous TID WC   ipratropium-albuterol  3 mL Nebulization Q6H   isosorbide mononitrate  30 mg Oral Daily   magnesium oxide  400 mg Oral Daily   melatonin  5 mg Oral QHS   pantoprazole  40 mg Oral Daily   polyethylene glycol  34 g Oral Daily   umeclidinium-vilanterol  1 puff Inhalation Daily   Continuous Infusions:  azithromycin 500 mg (06/05/23 2146)   cefTRIAXone (ROCEPHIN)  IV 2 g (06/05/23 2100)   PRN Meds:.acetaminophen **OR** acetaminophen, albuterol, clonazePAM, fentaNYL (SUBLIMAZE) injection, hydrALAZINE, HYDROcodone-acetaminophen, ondansetron **OR** ondansetron (ZOFRAN) IV, sodium chloride No Known Allergies Review of Systems  Physical Exam  Vital Signs:  BP 121/66   Pulse 90   Temp 97.9 F (36.6 C) (Oral)   Resp 20   Ht 5\' 6"  (1.676 m)   Wt 76.8 kg   SpO2 98%   BMI 27.33 kg/m  Pain Scale: 0-10   Pain Score: Asleep   SpO2: SpO2: 98 % O2 Device:SpO2: 98 % O2 Flow Rate: .O2 Flow Rate (L/min): 5 L/min  IO: Intake/output summary:  Intake/Output Summary (Last 24 hours) at 06/06/2023 1023 Last data filed at 06/06/2023 0600 Gross per 24 hour  Intake 850 ml  Output --  Net 850 ml    LBM: Last BM Date : 06/05/23 Baseline Weight: Weight: 76 kg Most recent weight: Weight: 76.8 kg     Palliative Assessment/Data:  Time In: *** Time Out: *** Time Total: *** Greater than 50%  of this time was spent counseling and coordinating care related to the above assessment and plan.  Signed by: Yong Channel, NP Palliative Medicine Team Pager # [phone] (M-F 8a-5p) Team Phone # [phone] (Nights/Weekends)

## 2023-06-06 NOTE — Progress Notes (Deleted)
Cardiology Office Note:  .   Date:  06/06/2023  ID:  Angela Lucero, DOB [birth year], MRN [id number] PCP: [name], MD  [facility] Providers Cardiologist:  Gypsy Balsam, MD    History of Present Illness: .   Angela Lucero is a 82 y.o. female with a past medical history of CAD S/P PTCA and PCI in 1999 & 2005, COPD oxygen dependent, CVA, DM 2, gout, hyperlipidemia, hypertension.  04/05/2023 stress evaluation revealed large fixed severe defect on the anterior apical wall with no definitive reversible changes 04/05/2023 echocardiogram EF of 20 to 25%, severe pulmonary hypertension with systolic pressure 63 mmHg, severe mitral regurgitation 02/25/23 echo EF 20-25%, global hypokinesis, moderately elevated PA pressures, severe MR, AV sclerosis 12/15/21 cMRI severe dilatation, severe diastolic dysfunction, subendocardial LGE consistent with prior infarct and basal atrial septum, mid anterior to/anteroseptum, apical anterior/septum and apex, EF 24% 12/14/2021 left heart cath severe single-vessel CAD with CTO of mid LAD with right to left and left to left collaterals, moderate left circumflex and RCA disease, patent distal RCA stent with mild in-stent restenosis  Hospital admission 02/04/2023 to 02/10/2023 for acute hypoxic respiratory failure, Upon presentation to the ED she was short of breath, acute sharp chest pain, BNP 3951, troponin 147-136, sodium 122, glucose 43. Eventually moved to inpatient rehab on 02/10/2023 to 02/25/2023 for debility, ultimately discharged home after PT OT services at rehab.    She was readmitted to [facility] on 04/01/23 for heart failure exacerbation, proBNP was 15,300.  Troponins trended 0.14> 3.42, consistent with non-STEMI, she was darted on heparin and placed on the Lasix drip.  Stress test was completed on 04/05/2023 showed large fixed severe defect of the anterior apical wall with no definitive reversible changes.  Echocardiogram revealed an EF of 20 to 25%, severe pulmonary  hypertension with systolic pressure 63 mmHg, severe mitral regurgitation.  She did have an episode of NSVT, felt to be exacerbated by severe hypomagnesemia.  LDL 35, labs on day of discharge revealed creatinine 1.40, potassium 3.9, sodium 135.  EP evaluation to consider CRT therapy after GDMT is maximized.  Admitted [facility] 04/15/2023, some confusion around how she was taking her medications and had not been taking her diuretics.  She required IV Lasix. Potassium 4.1, creatinine 1.20, proBNP 12,700, LDL 36.  Admitted to [facility] health on 05/01/2023 s/p closed right hip fracture, she underwent ORIF and was ultimately discharged to SNF.    ROS: Review of Systems  Constitutional:  Positive for malaise/fatigue.  HENT: Negative.    Eyes: Negative.   Respiratory:  Positive for shortness of breath.   Cardiovascular:  Positive for leg swelling.  Gastrointestinal: Negative.   Genitourinary: Negative.   Musculoskeletal: Negative.   Skin: Negative.   Neurological: Negative.   Endo/Heme/Allergies:  Bruises/bleeds easily.  Psychiatric/Behavioral: Negative.       Studies Reviewed: .          Risk Assessment/Calculations:             Physical Exam:   VS:  There were no vitals taken for this visit.   Wt Readings from Last 3 Encounters:  06/05/23 169 lb 5 oz (76.8 kg)  05/24/23 167 lb 15.9 oz (76.2 kg)  05/09/23 179 lb 7.3 oz (81.4 kg)    GEN: Well nourished, well developed in no acute distress NECK: No JVD; No carotid bruits CARDIAC: RRR, no murmurs, rubs, gallops RESPIRATORY:  Clear to auscultation without rales, wheezing or rhonchi  ABDOMEN: Soft, non-tender, non-distended EXTREMITIES: +2  pedal edema mid tibia; No deformity   ASSESSMENT AND PLAN: .   CAD-s/P DES x 2 it appears in 2021; stress evaluation on 04/05/2023 revealed large fixed severe defect on the anterior apical wall with no definitive reversible changes. Stable with no anginal symptoms. No indication for ischemic  evaluation.  Continue aspirin 81 mg daily, continue Imdur 90 mg daily, continue metoprolol 25 mg daily. 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  Continue Genia Del is on 25 mg daily for DM, continue Entresto 24-26 mg twice daily, continue metoprolol 25 mg daily.  Will start spironolactone 12.5 mg daily, GFR 43, potassium 3.8.  Will repeat BMET in 3 days, again in 10 days. Hyperlipidemia-recent LDL was well-controlled 35 on 04/05/2023, continue Lipitor 80 mg daily. Hypertension-blood pressure is actually low today, we will stop her hydralazine. COPD-currently oxygen dependent, followed by Dr. Winfred Leeds        Dispo: Labs per above, spironolactone per above  Signed, Flossie Dibble, NP

## 2023-06-06 NOTE — Progress Notes (Signed)
PHARMACY - ANTICOAGULATION CONSULT NOTE  Pharmacy Consult for heparin Indication:  Recent PE, apixaban on hold  No Known Allergies  Patient Measurements: Height: 5\' 6"  (167.6 cm) Weight: 76.8 kg (169 lb 5 oz) IBW/kg (Calculated) : 59.3 Heparin Dosing Weight: 75 kg  Vital Signs: Temp: 97.8 F (36.6 C) (10/07 2211) Temp Source: Oral (10/07 1225) BP: 92/55 (10/07 2211) Pulse Rate: 80 (10/07 2211)  Labs: Recent Labs    06/05/23 1355 06/05/23 1406 06/06/23 0413 06/06/23 2034  HGB 9.8* 11.6* 8.6*  --   HCT 33.4* 34.0* 29.3*  --   PLT 268  --  211  --   APTT  --   --   --  110*  HEPARINUNFRC  --   --   --  >1.10*  CREATININE 1.59* 1.90* 1.32*  --     Estimated Creatinine Clearance: 35.6 mL/min (A) (by C-G formula based on SCr of 1.32 mg/dL (H)).   Medical History: Past Medical History:  Diagnosis Date   Anemia 02/13/2023   Anxiety disorder, unspecified 03/16/2023   Arthritis 2010   Lower back and neck, feet   Chronic hypoxic respiratory failure (HCC) 05/01/2023   Chronic kidney disease (CKD), stage III (moderate) (HCC) 05/01/2023   Cognitive communication deficit 03/16/2023   Congestive heart failure (CHF) (HCC) 12/10/2021   COPD (chronic obstructive pulmonary disease) (HCC)    Coronary artery disease PTCA and stenting in 1997 and 2005 in [place] 12/10/2021   CVA (cerebral vascular accident) (HCC)    1997, 2005 stents   Debility 02/10/2023   Diabetes mellitus, type II (HCC)    Displaced fracture of right femoral neck (HCC) 05/01/2023   Dyslipidemia associated with type 2 diabetes mellitus (HCC) 02/01/2018   Dysphagia, oropharyngeal phase 03/16/2023   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   Type 2 diabetes mellitus with complication, without long-term current use of insulin (HCC) 02/01/2018    Medications: Apixaban 5 mg PO BID -Last dose: 10/6 @ 09:00 prior to admission  Assessment: Pt is an 26 yoF presenting with bloody stools. PMH significant for recent admission in September 2024 when pt was found to have a PE and started on apixaban.   Apixaban held on admission, GI consulted. Planning to transition anticoagulation to heparin drip (with no bolus) and monitor for any additional signs of bleeding.   Today, 06/06/23 Heparin level > 1.1 heparin level falsely elevated due to recent DOAC. aPTT = 110 sec (supratherapeutic) with heparin gtt @ 1200 units/hr No bleeding complications noted by RN.  Goal of Therapy:  Heparin level 0.3-0.7 units/ml aPTT 66-102 seconds Monitor platelets by anticoagulation protocol: Yes   Plan:  No bolus per MD Decrease heparin infusion to 1100 units/hr Check 8 hour APTT/heparin level after rate decrease. Once they correlate, can monitor using heparin level only CBC, aPTT/Heparin level daily Monitor for signs of bleeding  Angela Lucero, Joselyn Glassman, PharmD 06/06/2023,11:33 PM

## 2023-06-06 NOTE — Plan of Care (Signed)
  Problem: Education: Goal: Verbalization of understanding the information provided (i.e., activity precautions, restrictions, etc) will improve Outcome: Progressing Goal: Individualized Educational Video(s) Outcome: Progressing   Problem: Activity: Goal: Ability to ambulate and perform ADLs will improve Outcome: Progressing   Problem: Clinical Measurements: Goal: Postoperative complications will be avoided or minimized Outcome: Progressing   Problem: Pain Management: Goal: Pain level will decrease Outcome: Progressing   Problem: Activity: Goal: Ability to tolerate increased activity will improve Outcome: Progressing   Problem: Clinical Measurements: Goal: Ability to maintain a body temperature in the normal range will improve Outcome: Progressing

## 2023-06-06 NOTE — Progress Notes (Signed)
PT Cancellation Note  Patient Details Name: [name] MRN: [id number] DOB: April 01, 1941   Cancelled Treatment:    Reason Eval/Treat Not Completed: Patient declined, no reason specified. PT arrived and made pt aware PT would return in few minutes and retrieving a recliner. Pt verbalized understanding. PT returned and MD in room. PT returned 30 mins later and pt adamantly declined evaluation reporting she was tired. PT to return if schedule allows and continue to follow pt acutely.   [name], PT Acute Rehab    [name] 06/06/2023, 12:49 PM

## 2023-06-06 NOTE — Progress Notes (Signed)
PIV consult for second site. Ordered meds appear compatible. Message to RN.

## 2023-06-07 ENCOUNTER — Ambulatory Visit: Payer: Medicare Other | Admitting: Cardiology

## 2023-06-07 DIAGNOSIS — K625 Hemorrhage of anus and rectum: Secondary | ICD-10-CM | POA: Diagnosis not present

## 2023-06-07 DIAGNOSIS — I255 Ischemic cardiomyopathy: Secondary | ICD-10-CM

## 2023-06-07 DIAGNOSIS — E785 Hyperlipidemia, unspecified: Secondary | ICD-10-CM

## 2023-06-07 DIAGNOSIS — I2510 Atherosclerotic heart disease of native coronary artery without angina pectoris: Secondary | ICD-10-CM

## 2023-06-07 DIAGNOSIS — R0609 Other forms of dyspnea: Secondary | ICD-10-CM

## 2023-06-07 DIAGNOSIS — R609 Edema, unspecified: Secondary | ICD-10-CM

## 2023-06-07 DIAGNOSIS — I5042 Chronic combined systolic (congestive) and diastolic (congestive) heart failure: Secondary | ICD-10-CM

## 2023-06-07 LAB — COMPREHENSIVE METABOLIC PANEL
ALT: 15 U/L (ref 0–44)
AST: 16 U/L (ref 15–41)
Albumin: 2.9 g/dL — ABNORMAL LOW (ref 3.5–5.0)
Alkaline Phosphatase: 77 U/L (ref 38–126)
Anion gap: 8 (ref 5–15)
BUN: 66 mg/dL — ABNORMAL HIGH (ref 8–23)
CO2: 29 mmol/L (ref 22–32)
Calcium: 8.7 mg/dL — ABNORMAL LOW (ref 8.9–10.3)
Chloride: 98 mmol/L (ref 98–111)
Creatinine, Ser: 1.5 mg/dL — ABNORMAL HIGH (ref 0.44–1.00)
GFR, Estimated: 35 mL/min — ABNORMAL LOW (ref >60)
Glucose, Bld: 152 mg/dL — ABNORMAL HIGH (ref 70–99)
Potassium: 5.5 mmol/L — ABNORMAL HIGH (ref 3.5–5.1)
Sodium: 135 mmol/L (ref 135–145)
Total Bilirubin: 0.4 mg/dL (ref 0.3–1.2)
Total Protein: 6.6 g/dL (ref 6.5–8.1)

## 2023-06-07 LAB — CBC
HCT: 30.9 % — ABNORMAL LOW (ref 36.0–46.0)
Hemoglobin: 9.2 g/dL — ABNORMAL LOW (ref 12.0–15.0)
MCH: 28 pg (ref 26.0–34.0)
MCHC: 29.8 g/dL — ABNORMAL LOW (ref 30.0–36.0)
MCV: 93.9 fL (ref 80.0–100.0)
Platelets: 231 10*3/uL (ref 150–400)
RBC: 3.29 MIL/uL — ABNORMAL LOW (ref 3.87–5.11)
RDW: 19 % — ABNORMAL HIGH (ref 11.5–15.5)
WBC: 8.2 10*3/uL (ref 4.0–10.5)
nRBC: 0 % (ref 0.0–0.2)

## 2023-06-07 LAB — HEPARIN LEVEL (UNFRACTIONATED): Heparin Unfractionated: >1.1 [IU]/mL — ABNORMAL HIGH (ref 0.30–0.70)

## 2023-06-07 LAB — APTT: aPTT: 127 s — ABNORMAL HIGH (ref 24–36)

## 2023-06-07 LAB — GLUCOSE, CAPILLARY
Glucose-Capillary: 149 mg/dL — ABNORMAL HIGH (ref 70–99)
Glucose-Capillary: 315 mg/dL — ABNORMAL HIGH (ref 70–99)

## 2023-06-07 MED ORDER — HEPARIN (PORCINE) 25000 UT/250ML-% IV SOLN: 950.0000 [IU]/h | INTRAVENOUS | Status: DC

## 2023-06-07 MED ORDER — HYDROCODONE-ACETAMINOPHEN 5-325 MG PO TABS: 1.0000 | ORAL_TABLET | ORAL | 0 refills | Status: AC | PRN

## 2023-06-07 MED ORDER — AZITHROMYCIN 250 MG PO TABS: 500.0000 mg | ORAL_TABLET | ORAL | Status: DC

## 2023-06-07 MED ORDER — CLONAZEPAM 0.5 MG PO TABS: 0.5000 mg | ORAL_TABLET | Freq: Three times a day (TID) | ORAL | 0 refills | Status: AC | PRN

## 2023-06-07 NOTE — Evaluation (Signed)
Physical Therapy Evaluation Patient Details Name: Angela Lucero MRN: [id number] DOB: [birth year] Today's Date: 06/07/2023  History of Present Illness  Patient is a 82 year old female who presented from SNF with rectal bleeding. Patient recently had right hip hemiarthroplasy(posterior) on 9/3. Patient was admitted with lower GI bleed, PMH: pulmonary embolism 9/20-9/24, chronic systolic CHF, CKD III, CAD, COPD, generalized anxiety disorder,  Clinical Impression  Pt admitted with above diagnosis.  Pt currently with functional limitations due to the deficits listed below (see PT Problem List). Pt will benefit from acute skilled PT to increase their independence and safety with mobility to allow discharge.     The patient reports  mobility has been limited at [facility] due to admissions back to hospital. Patient did ambulate x 6' x 2 with Rw and   2 LPM . Patient with 3/4 dyspnea.  Patient will benefit from  return to SNF. Patient will benefit from continued inpatient follow up therapy, <3 hours/day.      If plan is discharge home, recommend the following: A little help with walking and/or transfers;A little help with bathing/dressing/bathroom;Assist for transportation   Can travel by private vehicle        Equipment Recommendations None recommended by PT  Recommendations for Other Services       Functional Status Assessment Patient has had a recent decline in their functional status and demonstrates the ability to make significant improvements in function in a reasonable and predictable amount of time.     Precautions / Restrictions Precautions Precautions: Posterior Hip;Fall Precaution Booklet Issued: No Precaution Comments: pt able to recall when asked Restrictions Weight Bearing Restrictions: No RLE Weight Bearing: Weight bearing as tolerated      Mobility  Bed Mobility                    Transfers                        Ambulation/Gait Ambulation/Gait assistance:  Min assist, +2 safety/equipment Gait Distance (Feet): 6 Feet (x 2) Assistive device: Rolling walker (2 wheels) Gait Pattern/deviations: Step-to pattern, Decreased step length - right, Decreased step length - left, Decreased stride length, Trunk flexed, Antalgic Gait velocity: decr     General Gait Details: ambulated to [facility], O2line limited  Stairs            Wheelchair Mobility     Tilt Bed    Modified Rankin (Stroke Patients Only)       Balance Overall balance assessment: Needs assistance                                           Pertinent Vitals/Pain Pain Assessment Faces Pain Scale: Hurts a little bit Pain Location: R hip pain Pain Descriptors / Indicators: Discomfort Pain Intervention(s): Patient requesting pain meds-RN notified, Monitored during session    Home Living Family/patient expects to be discharged to:: Skilled nursing facility Living Arrangements: Alone Available Help at Discharge: Family;Friend(s);Available PRN/intermittently Type of Home: Mobile home Home Access: Stairs to enter Entrance Stairs-Rails: Left Entrance Stairs-Number of Steps: 3   Home Layout: One level Home Equipment: Cane - single Librarian, academic (2 wheels);Shower seat;Grab bars - tub/shower Additional Comments: plans return to SNF for rehab    Prior Function Prior Level of Function : Needs assist  Mobility Comments: pt reports "I haven't walked yet." "I've only stood up." ADLs Comments: assist from staff at [facility]     Extremity/Trunk Assessment   Upper Extremity Assessment Upper Extremity Assessment: Overall WFL for tasks assessed    Lower Extremity Assessment Lower Extremity Assessment: Defer to PT evaluation    Cervical / Trunk Assessment Cervical / Trunk Assessment: Normal  Communication   Communication Communication: No apparent difficulties  Cognition Arousal: Alert Behavior During Therapy: WFL for tasks  assessed/performed Overall Cognitive Status: Within Functional Limits for tasks assessed                                          General Comments      Exercises     Assessment/Plan    PT Assessment Patient needs continued PT services  PT Problem List Decreased strength;Decreased range of motion;Decreased activity tolerance;Decreased balance;Decreased mobility       PT Treatment Interventions DME instruction;Therapeutic exercise;Gait training;Balance training;Stair training;Neuromuscular re-education;Functional mobility training;Therapeutic activities;Patient/family education    PT Goals (Current goals can be found in the Care Plan section)  Acute Rehab PT Goals Patient Stated Goal: return to rehab PT Goal Formulation: With patient Time For Goal Achievement: 06/21/23 Potential to Achieve Goals: Good    Frequency Min 1X/week     Co-evaluation PT/OT/SLP Co-Evaluation/Treatment: Yes Reason for Co-Treatment: To address functional/ADL transfers PT goals addressed during session: Mobility/safety with mobility OT goals addressed during session: ADL's and self-care       AM-PAC PT "6 Clicks" Mobility  Outcome Measure   Help needed moving from lying on your back to sitting on the side of a flat bed without using bedrails?: A Little Help needed moving to and from a bed to a chair (including a wheelchair)?: A Little Help needed standing up from a chair using your arms (e.g., wheelchair or bedside chair)?: A Little Help needed to walk in hospital room?: A Lot Help needed climbing 3-5 steps with a railing? : A Lot 6 Click Score: 13    End of Session Equipment Utilized During Treatment: Gait belt;Oxygen Activity Tolerance: Patient tolerated treatment well Patient left: with call bell/phone within reach;in chair;with chair alarm set Nurse Communication: Mobility status PT Visit Diagnosis: Muscle weakness (generalized) (M62.81);Difficulty in walking, not  elsewhere classified (R26.2) Pain - Right/Left: Right Pain - part of body: Leg    Time: 1102-      Charges:   PT Evaluation $PT Eval Low Complexity: 1 Low   PT General Charges $$ ACUTE PT VISIT: 1 Visit         Blanchard Kelch PT Acute Rehabilitation Services Office [phone] Weekend pager-[phone]   Angela Lucero, Angela Lucero 06/07/2023, 1:27 PM

## 2023-06-07 NOTE — Progress Notes (Signed)
Report called to [facility] at [phone]. Receiving nurse [place].

## 2023-06-07 NOTE — [place] FL2 (Signed)
[place] MEDICAID FL2 LEVEL OF CARE FORM     IDENTIFICATION  Patient Name: Angela Lucero Birthdate: 06/19/1941 Sex: female Admission Date (Current Location): 06/05/2023  [facility] and [place] Number:  Producer, television/film/video and Address:  [facility],  [address] [place], [place] [address]      Provider Number:    Attending Physician Name and Address:  Jerald Kief, MD  Relative Name and Phone Number:  [name] [phone]    Current Level of Care: Hospital Recommended Level of Care:   Prior Approval Number:    Date Approved/Denied:   PASRR Number: [id number]  Discharge Plan: SNF    Current Diagnoses: Patient Active Problem List   Diagnosis Date Noted   Acute blood loss anemia 06/06/2023   Acute lower GI bleeding 06/05/2023   Chronic kidney disease, stage 3b (HCC) 06/05/2023   DNR (do not resuscitate) 06/05/2023   Acute pulmonary embolism (HCC) 05/20/2023   UTI (urinary tract infection) 05/20/2023   History of hemiarthroplasty of right hip 05/20/2023   CAD (coronary artery disease)    CVA (cerebral vascular accident) (HCC)    Diabetes (HCC)    Diabetes mellitus, type II (HCC)    Heart attack (HCC)    Hypertension    Hypertriglyceridemia    Displaced fracture of right femoral neck (HCC) 05/01/2023   History of CAD (coronary artery disease) 05/01/2023   Chronic hypoxic respiratory failure (HCC) 05/01/2023   CKD stage 3 due to type 2 diabetes mellitus (HCC) 05/01/2023   Closed right hip fracture, initial encounter (HCC) 05/01/2023   GAD (generalized anxiety disorder) 03/16/2023   Cognitive communication deficit 03/16/2023   Dysphagia, oropharyngeal phase 03/16/2023   Hyperglycemia, unspecified 03/16/2023   Hyperlipidemia 03/16/2023   Muscle wasting and atrophy, not elsewhere classified, multiple sites 03/16/2023   Nicotine dependence, cigarettes, uncomplicated 03/16/2023   Nonrheumatic mitral (valve) insufficiency 03/16/2023   Other malaise  03/16/2023   Other nonthrombocytopenic purpura (HCC) 03/16/2023   Pneumonia, unspecified organism 03/16/2023   Anxiety disorder, unspecified 03/16/2023   Coping style affecting medical condition 02/23/2023   Melanotic stools 02/13/2023   Anemia 02/13/2023   Debility 02/10/2023   Acute kidney injury superimposed on chronic kidney disease (HCC) 02/06/2023   Acute on chronic hypoxic respiratory failure (HCC) 02/05/2023   COPD (chronic obstructive pulmonary disease) (HCC) 02/05/2023   Chronic systolic CHF (congestive heart failure) (HCC) 02/05/2023   COPD exacerbation (HCC) 02/05/2023   Acute hypoxic respiratory failure (HCC) 02/04/2023   Hyponatremia 02/04/2023   Elevated troponin 02/04/2023   Cardiomyopathy (HCC) 01/01/2022   Chronic edema 12/18/2021   Non-ST elevation (NSTEMI) myocardial infarction (HCC) 12/14/2021   Acute systolic heart failure (HCC) 12/14/2021   Coronary artery disease PTCA and stenting in 1997 and 2005 in [place] 12/10/2021   Acute exacerbation of CHF (congestive heart failure) (HCC) 12/10/2021   Smoking 12/10/2021   Dyslipidemia 12/10/2021   Preop cardiovascular exam 12/10/2021   Congestive heart failure (CHF) (HCC) 12/10/2021   Flat foot 02/01/2018   Dyslipidemia associated with type 2 diabetes mellitus (HCC) 02/01/2018   Arthritis of both feet 02/01/2018   Type 2 diabetes mellitus with complication, without long-term current use of insulin (HCC) 02/01/2018   Arthritis 2010   Gout 1998    Orientation RESPIRATION BLADDER Height & Weight     Self, Time, Situation   (4L) Continent Weight: 169 lb 5 oz (76.8 kg) Height:  5\' 6"  (167.6 cm)  BEHAVIORAL SYMPTOMS/MOOD NEUROLOGICAL BOWEL NUTRITION STATUS  Continent    AMBULATORY STATUS COMMUNICATION OF NEEDS Skin     Verbally Normal                       Personal Care Assistance Level of Assistance  Bathing, Feeding Bathing Assistance: Limited assistance Feeding assistance: Independent Dressing  Assistance: Limited assistance     Functional Limitations Info  Sight, Hearing, Speech Sight Info: Adequate Hearing Info: Adequate Speech Info: Adequate    SPECIAL CARE FACTORS FREQUENCY  PT (By licensed PT), OT (By licensed OT)     PT Frequency: 5x/wk OT Frequency: 5x/wk            Contractures Contractures Info: Not present    Additional Factors Info  Code Status, Allergies Code Status Info: DNR Allergies Info: No Known Allergies           Current Medications (06/07/2023):  This is the current hospital active medication list Current Facility-Administered Medications  Medication Dose Route Frequency Provider Last Rate Last Admin   acetaminophen (TYLENOL) tablet 650 mg  650 mg Oral Q6H PRN Jonah Blue, MD       Or   acetaminophen (TYLENOL) suppository 650 mg  650 mg Rectal Q6H PRN Jonah Blue, MD       albuterol (PROVENTIL) (2.5 MG/3ML) 0.083% nebulizer solution 2.5 mg  2.5 mg Nebulization Q2H PRN Jonah Blue, MD   2.5 mg at 06/06/23 2012   atorvastatin (LIPITOR) tablet 80 mg  80 mg Oral Daily Jonah Blue, MD   80 mg at 06/07/23 0905   azithromycin (ZITHROMAX) tablet 500 mg  500 mg Oral Q24H Jerald Kief, MD       busPIRone (BUSPAR) tablet 10 mg  10 mg Oral TID Jonah Blue, MD   10 mg at 06/07/23 0906   carvedilol (COREG) tablet 6.25 mg  6.25 mg Oral BID WC Jonah Blue, MD   6.25 mg at 06/07/23 0905   cefTRIAXone (ROCEPHIN) 2 g in sodium chloride 0.9 % 100 mL IVPB  2 g Intravenous Q24H Jerald Kief, MD   Stopped at 06/06/23 1724   clonazePAM (KLONOPIN) tablet 0.5 mg  0.5 mg Oral Q8H PRN Jonah Blue, MD   0.5 mg at 06/07/23 0630   docusate sodium (COLACE) capsule 100 mg  100 mg Oral BID Jonah Blue, MD   100 mg at 06/07/23 4098   feeding supplement (BOOST / RESOURCE BREEZE) liquid 1 Container  1 Container Oral TID BM Jonah Blue, MD   1 Container at 06/07/23 1191   fentaNYL (SUBLIMAZE) injection 12.5-25 mcg  12.5-25 mcg Intravenous  Q2H PRN Jonah Blue, MD       ferrous sulfate tablet 325 mg  325 mg Oral TID Jackelyn Knife, MD   325 mg at 06/07/23 0906   heparin ADULT infusion 100 units/mL (25000 units/210mL)  950 Units/hr Intravenous Continuous Cindi Carbon, RPH 9.5 mL/hr at 06/07/23 0945 950 Units/hr at 06/07/23 0945   hydrALAZINE (APRESOLINE) injection 5 mg  5 mg Intravenous Q4H PRN Jonah Blue, MD       hydrALAZINE (APRESOLINE) tablet 25 mg  25 mg Oral Bobette Mo, MD   25 mg at 06/06/23 1532   HYDROcodone-acetaminophen (NORCO/VICODIN) 5-325 MG per tablet 1-2 tablet  1-2 tablet Oral Q4H PRN Jonah Blue, MD   2 tablet at 06/07/23 0905   insulin aspart (novoLOG) injection 0-15 Units  0-15 Units Subcutaneous TID WC Jonah Blue, MD   2 Units at 06/07/23 [phone]  ipratropium-albuterol (DUONEB) 0.5-2.5 (3) MG/3ML nebulizer solution 3 mL  3 mL Nebulization Q6H Jonah Blue, MD   3 mL at 06/07/23 0931   isosorbide mononitrate (IMDUR) 24 hr tablet 30 mg  30 mg Oral Daily Jonah Blue, MD   30 mg at 06/07/23 0905   magnesium oxide (MAG-OX) tablet 400 mg  400 mg Oral Daily Jonah Blue, MD   400 mg at 06/07/23 2130   melatonin tablet 5 mg  5 mg Oral Noemi Chapel, MD   5 mg at 06/06/23 2108   ondansetron (ZOFRAN) tablet 4 mg  4 mg Oral Q6H PRN Jonah Blue, MD   4 mg at 06/07/23 0324   Or   ondansetron (ZOFRAN) injection 4 mg  4 mg Intravenous Q6H PRN Jonah Blue, MD       pantoprazole (PROTONIX) EC tablet 40 mg  40 mg Oral Daily Jonah Blue, MD   40 mg at 06/07/23 0905   polyethylene glycol (MIRALAX / GLYCOLAX) packet 34 g  34 g Oral Daily Jonah Blue, MD   34 g at 06/07/23 0905   sodium chloride (OCEAN) 0.65 % nasal spray 1 spray  1 spray Each Nare PRN Jerald Kief, MD       umeclidinium-vilanterol (ANORO ELLIPTA) 62.5-25 MCG/ACT 1 puff  1 puff Inhalation Daily Jonah Blue, MD   1 puff at 06/07/23 0905     Discharge Medications: Please see discharge summary for a  list of discharge medications.  Relevant Imaging Results:  Relevant Lab Results:   Additional Information SSN: [id number]  Otelia Santee, LCSW

## 2023-06-07 NOTE — TOC Transition Note (Signed)
Transition of Care [facility]) - CM/SW Discharge Note   Patient Details  Name: Angela Lucero MRN: [id number] Date of Birth: [birth year]  Transition of Care [facility] [place] [facility]) CM/SW Contact:  Otelia Santee, LCSW Phone Number: 06/07/2023, 2:04 PM   Clinical Narrative:    Pt is to return to [facility] in [place] for SNF placement. Pt will be going to room 101. RN to call report to [phone]. Spoke with pt's son to confirm discharge plans. PTAR called at 2:18pm. ETA for transport between 3-4pm.    Final next level of care: Skilled Nursing Facility     Patient Goals and CMS Choice   Choice offered to / list presented to : Patient, Adult Children  Discharge Placement                Patient chooses bed at: Other - please specify in the comment section below: ([facility]) Patient to be transferred to facility by: PTAR Name of family member notified: Trey Paula Patient and family notified of of transfer: 06/07/23  Discharge Plan and Services Additional resources added to the After Visit Summary for   In-house Referral: NA Discharge Planning Services: NA Post Acute Care Choice: Skilled Nursing Facility          DME Arranged: N/A DME Agency: NA Date DME Agency Contacted:  (NA)                Social Determinants of Health (SDOH) Interventions SDOH Screenings   Food Insecurity: No Food Insecurity (06/05/2023)  Housing: Low Risk  (06/05/2023)  Transportation Needs: No Transportation Needs (06/05/2023)  Utilities: Not At Risk (06/05/2023)  Alcohol Screen: Low Risk  (02/10/2023)  Financial Resource Strain: Low Risk  (02/10/2023)  Tobacco Use: Medium Risk (06/05/2023)     Readmission Risk Interventions    06/07/2023    2:03 PM  Readmission Risk Prevention Plan  Transportation Screening Complete  Medication Review (RN Care Manager) Complete  PCP or Specialist appointment within 3-5 days of discharge Complete  HRI or Home Care Consult Complete  SW Recovery Care/Counseling Consult Complete  Palliative  Care Screening Not Applicable  Skilled Nursing Facility Complete

## 2023-06-07 NOTE — Progress Notes (Signed)
Subjective: Patient states she has not had blood in her stool. Last documented bowel movement was at 2:30 AM, formed black stool reported.  Objective: Vital signs in last 24 hours: Temp:  [97.5 F (36.4 C)-98.1 F (36.7 C)] 97.6 F (36.4 C) (10/08 0626) Pulse Rate:  [71-90] 85 (10/08 0901) Resp:  [18-30] 20 (10/08 0626) BP: (92-122)/(55-68) 119/68 (10/08 0901) SpO2:  [88 %-100 %] 97 % (10/08 0932) Weight change:  Last BM Date : 06/06/23  PE: Pleasant, not in distress GENERAL: Mild pallor  ABDOMEN: Soft, nondistended, nontender, normoactive bowel sounds EXTREMITIES: No deformity  Lab Results: Results for orders placed or performed during the hospital encounter of 06/05/23 (from the past 48 hour(s))  CBC with Differential     Status: Abnormal   Collection Time: 06/05/23  1:55 PM  Result Value Ref Range   WBC 9.3 4.0 - 10.5 K/uL   RBC 3.61 (L) 3.87 - 5.11 MIL/uL   Hemoglobin 9.8 (L) 12.0 - 15.0 g/dL   HCT 16.1 (L) 09.6 - 04.5 %   MCV 92.5 80.0 - 100.0 fL   MCH 27.1 26.0 - 34.0 pg   MCHC 29.3 (L) 30.0 - 36.0 g/dL   RDW 40.9 (H) 81.1 - 91.4 %   Platelets 268 150 - 400 K/uL   nRBC 0.0 0.0 - 0.2 %   Neutrophils Relative % 84 %   Neutro Abs 7.7 1.7 - 7.7 K/uL   Lymphocytes Relative 7 %   Lymphs Abs 0.7 0.7 - 4.0 K/uL   Monocytes Relative 9 %   Monocytes Absolute 0.9 0.1 - 1.0 K/uL   Eosinophils Relative 0 %   Eosinophils Absolute 0.0 0.0 - 0.5 K/uL   Basophils Relative 0 %   Basophils Absolute 0.0 0.0 - 0.1 K/uL   Immature Granulocytes 0 %   Abs Immature Granulocytes 0.04 0.00 - 0.07 K/uL    Comment: Performed at [facility], [address], [place], [place] [address]  Comprehensive metabolic panel     Status: Abnormal   Collection Time: 06/05/23  1:55 PM  Result Value Ref Range   Sodium 135 135 - 145 mmol/L   Potassium 3.5 3.5 - 5.1 mmol/L   Chloride 93 (L) 98 - 111 mmol/L   CO2 32 22 - 32 mmol/L   Glucose, Bld 217 (H) 70 - 99 mg/dL    Comment:  Glucose reference range applies only to samples taken after fasting for at least 8 hours.   BUN 62 (H) 8 - 23 mg/dL   Creatinine, Ser 6.21 (H) 0.44 - 1.00 mg/dL   Calcium 9.0 8.9 - 30.8 mg/dL   Total Protein 7.3 6.5 - 8.1 g/dL   Albumin 3.2 (L) 3.5 - 5.0 g/dL   AST 12 (L) 15 - 41 U/L   ALT 11 0 - 44 U/L   Alkaline Phosphatase 67 38 - 126 U/L   Total Bilirubin 0.4 0.3 - 1.2 mg/dL   GFR, Estimated 33 (L) >60 mL/min    Comment: (NOTE) Calculated using the CKD-EPI Creatinine Equation (2021)    Anion gap 10 5 - 15    Comment: Performed at [facility], [address], [place], [place] [address]  Type and screen [facility]     Status: None   Collection Time: 06/05/23  1:55 PM  Result Value Ref Range   ABO/RH(D) A POS    Antibody Screen NEG    Sample Expiration      06/08/2023,2359 Performed  at [facility], [address], [place], [place] [address]   I-stat chem 8, ED (not at [facility], [facility] or [facility])     Status: Abnormal   Collection Time: 06/05/23  2:06 PM  Result Value Ref Range   Sodium 136 135 - 145 mmol/L   Potassium 3.7 3.5 - 5.1 mmol/L   Chloride 93 (L) 98 - 111 mmol/L   BUN 57 (H) 8 - 23 mg/dL   Creatinine, Ser 6.04 (H) 0.44 - 1.00 mg/dL   Glucose, Bld 540 (H) 70 - 99 mg/dL    Comment: Glucose reference range applies only to samples taken after fasting for at least 8 hours.   Calcium, Ion 1.17 1.15 - 1.40 mmol/L   TCO2 33 (H) 22 - 32 mmol/L   Hemoglobin 11.6 (L) 12.0 - 15.0 g/dL   HCT 98.1 (L) 19.1 - 47.8 %  POC occult blood, ED Provider will collect     Status: Abnormal   Collection Time: 06/05/23  2:11 PM  Result Value Ref Range   Fecal Occult Bld POSITIVE (A) NEGATIVE  Glucose, capillary     Status: Abnormal   Collection Time: 06/06/23 12:43 AM  Result Value Ref Range   Glucose-Capillary 240 (H) 70 - 99 mg/dL    Comment: Glucose reference range applies only to samples taken after fasting for at least 8 hours.   Basic metabolic panel     Status: Abnormal   Collection Time: 06/06/23  4:13 AM  Result Value Ref Range   Sodium 135 135 - 145 mmol/L   Potassium 3.0 (L) 3.5 - 5.1 mmol/L   Chloride 96 (L) 98 - 111 mmol/L   CO2 29 22 - 32 mmol/L   Glucose, Bld 166 (H) 70 - 99 mg/dL    Comment: Glucose reference range applies only to samples taken after fasting for at least 8 hours.   BUN 63 (H) 8 - 23 mg/dL   Creatinine, Ser 2.95 (H) 0.44 - 1.00 mg/dL   Calcium 8.5 (L) 8.9 - 10.3 mg/dL   GFR, Estimated 41 (L) >60 mL/min    Comment: (NOTE) Calculated using the CKD-EPI Creatinine Equation (2021)    Anion gap 10 5 - 15    Comment: Performed at [facility], [address], [place], [place] [address]  CBC     Status: Abnormal   Collection Time: 06/06/23  4:13 AM  Result Value Ref Range   WBC 7.4 4.0 - 10.5 K/uL   RBC 3.16 (L) 3.87 - 5.11 MIL/uL   Hemoglobin 8.6 (L) 12.0 - 15.0 g/dL   HCT 86.5 (L) 78.4 - 69.6 %   MCV 92.7 80.0 - 100.0 fL   MCH 27.2 26.0 - 34.0 pg   MCHC 29.4 (L) 30.0 - 36.0 g/dL   RDW 29.5 (H) 28.4 - 13.2 %   Platelets 211 150 - 400 K/uL   nRBC 0.0 0.0 - 0.2 %    Comment: Performed at [facility], [address], [place], [place] [address]  Glucose, capillary     Status: Abnormal   Collection Time: 06/06/23  4:52 AM  Result Value Ref Range   Glucose-Capillary 156 (H) 70 - 99 mg/dL    Comment: Glucose reference range applies only to samples taken after fasting for at least 8 hours.  Glucose, capillary     Status: Abnormal   Collection Time: 06/06/23  8:21 AM  Result Value Ref Range   Glucose-Capillary 152 (H) 70 - 99  mg/dL    Comment: Glucose reference range applies only to samples taken after fasting for at least 8 hours.  Glucose, capillary     Status: Abnormal   Collection Time: 06/06/23 12:33 PM  Result Value Ref Range   Glucose-Capillary 148 (H) 70 - 99 mg/dL    Comment: Glucose reference range applies only to samples taken after  fasting for at least 8 hours.  Glucose, capillary     Status: Abnormal   Collection Time: 06/06/23  4:25 PM  Result Value Ref Range   Glucose-Capillary 168 (H) 70 - 99 mg/dL    Comment: Glucose reference range applies only to samples taken after fasting for at least 8 hours.  Glucose, capillary     Status: Abnormal   Collection Time: 06/06/23  8:08 PM  Result Value Ref Range   Glucose-Capillary 270 (H) 70 - 99 mg/dL    Comment: Glucose reference range applies only to samples taken after fasting for at least 8 hours.  Heparin level (unfractionated)     Status: Abnormal   Collection Time: 06/06/23  8:34 PM  Result Value Ref Range   Heparin Unfractionated >1.10 (H) 0.30 - 0.70 IU/mL    Comment: (NOTE) The clinical reportable range upper limit is being lowered to >1.10 to align with the FDA approved guidance for the current laboratory assay.  If heparin results are below expected values, and patient dosage has  been confirmed, suggest follow up testing of antithrombin III levels. Performed at [facility], [address], [place], [place] [address]   APTT     Status: Abnormal   Collection Time: 06/06/23  8:34 PM  Result Value Ref Range   aPTT 110 (H) 24 - 36 seconds    Comment:        IF BASELINE aPTT IS ELEVATED, SUGGEST PATIENT RISK ASSESSMENT BE USED TO DETERMINE APPROPRIATE ANTICOAGULANT THERAPY. Performed at [facility], [address] [place], [place] [address]   CBC     Status: Abnormal   Collection Time: 06/07/23  8:17 AM  Result Value Ref Range   WBC 8.2 4.0 - 10.5 K/uL   RBC 3.29 (L) 3.87 - 5.11 MIL/uL   Hemoglobin 9.2 (L) 12.0 - 15.0 g/dL   HCT 10.2 (L) 72.5 - 36.6 %   MCV 93.9 80.0 - 100.0 fL   MCH 28.0 26.0 - 34.0 pg   MCHC 29.8 (L) 30.0 - 36.0 g/dL   RDW 44.0 (H) 34.7 - 42.5 %   Platelets 231 150 - 400 K/uL   nRBC 0.0 0.0 - 0.2 %    Comment: Performed at [facility], [address]  [place], [place] [address]  Comprehensive metabolic panel     Status: Abnormal   Collection Time: 06/07/23  8:17 AM  Result Value Ref Range   Sodium 135 135 - 145 mmol/L   Potassium 5.5 (H) 3.5 - 5.1 mmol/L   Chloride 98 98 - 111 mmol/L   CO2 29 22 - 32 mmol/L   Glucose, Bld 152 (H) 70 - 99 mg/dL    Comment: Glucose reference range applies only to samples taken after fasting for at least 8 hours.   BUN 66 (H) 8 - 23 mg/dL   Creatinine, Ser 7.56 (H) 0.44 - 1.00 mg/dL   Calcium 8.7 (L) 8.9 - 10.3 mg/dL   Total Protein 6.6 6.5 - 8.1 g/dL   Albumin 2.9 (L) 3.5 - 5.0 g/dL   AST 16 15 -  41 U/L   ALT 15 0 - 44 U/L   Alkaline Phosphatase 77 38 - 126 U/L   Total Bilirubin 0.4 0.3 - 1.2 mg/dL   GFR, Estimated 35 (L) >60 mL/min    Comment: (NOTE) Calculated using the CKD-EPI Creatinine Equation (2021)    Anion gap 8 5 - 15    Comment: Performed at [facility], [address], [place], [place] [address]  APTT     Status: Abnormal   Collection Time: 06/07/23  8:17 AM  Result Value Ref Range   aPTT 127 (H) 24 - 36 seconds    Comment:        IF BASELINE aPTT IS ELEVATED, SUGGEST PATIENT RISK ASSESSMENT BE USED TO DETERMINE APPROPRIATE ANTICOAGULANT THERAPY. Performed at [facility], [address] [place], [place] [address]   Heparin level (unfractionated)     Status: Abnormal   Collection Time: 06/07/23  8:17 AM  Result Value Ref Range   Heparin Unfractionated >1.10 (H) 0.30 - 0.70 IU/mL    Comment: (NOTE) The clinical reportable range upper limit is being lowered to >1.10 to align with the FDA approved guidance for the current laboratory assay.  If heparin results are below expected values, and patient dosage has  been confirmed, suggest follow up testing of antithrombin III levels. Performed at [facility], [address], [place], [place] [address]   Glucose, capillary     Status: Abnormal   Collection Time: 06/07/23  8:54  AM  Result Value Ref Range   Glucose-Capillary 149 (H) 70 - 99 mg/dL    Comment: Glucose reference range applies only to samples taken after fasting for at least 8 hours.    Studies/Results: DG CHEST PORT 1 VIEW  Result Date: 06/05/2023 CLINICAL DATA:  Cough EXAM: PORTABLE CHEST 1 VIEW COMPARISON:  05/20/2023 FINDINGS: Lungs are clear. No pneumothorax or pleural effusion. Stable mild cardiomegaly. Hilar enlargement bilaterally relates to central pulmonary arterial enlargement better seen on CT examination 05/20/2023. No overt pulmonary edema. No acute bone abnormality. IMPRESSION: 1. No active disease. 2. Cardiomegaly and pulmonary arterial hypertension. Electronically Signed   By: Helyn Numbers M.D.   On: 06/05/2023 18:35    Medications: I have reviewed the patient's current medications.  Assessment: Hemoglobin has been stable, 8.6 yesterday and 9.2 today Has not needed blood transfusion BUN/creatinine/GFR 66/1.5/35 today-fairly stable  Plan: I will start her on regular diet. She has been started on heparin from yesterday without evidence of GI blood loss. Okay to resume Eliquis in 2 days. Okay to DC home/nursing facility from GI standpoint. Please recall GI if needed.  Kerin Salen, MD 06/07/2023, 9:54 AM

## 2023-06-07 NOTE — Progress Notes (Signed)
PHARMACY - ANTICOAGULATION CONSULT NOTE  Pharmacy Consult for heparin Indication:  Recent PE, apixaban on hold  No Known Allergies  Patient Measurements: Height: 5\' 6"  (167.6 cm) Weight: 76.8 kg (169 lb 5 oz) IBW/kg (Calculated) : 59.3 Heparin Dosing Weight: 75 kg  Vital Signs: Temp: 97.6 F (36.4 C) (10/08 0626) Temp Source: Oral (10/08 0626) BP: 108/63 (10/08 0626) Pulse Rate: 81 (10/08 0626)  Labs: Recent Labs    06/05/23 1355 06/05/23 1406 06/06/23 0413 06/06/23 2034  HGB 9.8* 11.6* 8.6*  --   HCT 33.4* 34.0* 29.3*  --   PLT 268  --  211  --   APTT  --   --   --  110*  HEPARINUNFRC  --   --   --  >1.10*  CREATININE 1.59* 1.90* 1.32*  --     Estimated Creatinine Clearance: 35.6 mL/min (A) (by C-G formula based on SCr of 1.32 mg/dL (H)).   Medical History: Past Medical History:  Diagnosis Date   Anemia 02/13/2023   Anxiety disorder, unspecified 03/16/2023   Arthritis 2010   Lower back and neck, feet   Chronic hypoxic respiratory failure (HCC) 05/01/2023   Chronic kidney disease (CKD), stage III (moderate) (HCC) 05/01/2023   Cognitive communication deficit 03/16/2023   Congestive heart failure (CHF) (HCC) 12/10/2021   COPD (chronic obstructive pulmonary disease) (HCC)    Coronary artery disease PTCA and stenting in 1997 and 2005 in [place] 12/10/2021   CVA (cerebral vascular accident) (HCC)    1997, 2005 stents   Debility 02/10/2023   Diabetes mellitus, type II (HCC)    Displaced fracture of right femoral neck (HCC) 05/01/2023   Dyslipidemia associated with type 2 diabetes mellitus (HCC) 02/01/2018   Dysphagia, oropharyngeal phase 03/16/2023   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 PMH significant for recent admission in September 2024 when pt was found to have a PE and started on apixaban.   Apixaban held on admission, GI consulted. Planning to transition anticoagulation to heparin drip (with no bolus) and monitor for any additional signs of bleeding.   Today, 06/07/23 aPTT = 127 seconds is supratherapeutic despite decreasing rate of heparin infusion to 1100 units/hr Heparin level remains falsely elevated CBC: Hgb low but stable; Plt WNL Confirmed with RN - no signs of bleeding, no bloody BM  Goal of Therapy:  Heparin level 0.3-0.7 units/ml aPTT 66-102 seconds Monitor platelets by anticoagulation protocol: Yes   Plan:  Reduce heparin infusion to 950 units/hr Check 8 hour APTT CBC, aPTT/Heparin level daily. Once heparin level and aPTT correlate, can monitor using heparin level only Monitor for signs of bleeding  Cindi Carbon, PharmD 06/07/2023,7:13 AM

## 2023-06-07 NOTE — Discharge Summary (Signed)
Physician Discharge Summary   Patient: Angela Lucero MRN: [id number] DOB: 21-Jan-1941  Admit date:     06/05/2023  Discharge date: 06/07/23  Discharge Physician: Rickey Barbara   PCP: [name], MD   Recommendations at discharge:    Follow up with PCP in 1-2 weeks Please continue to hold Eliquis and Aspirin, but resume them on 10/10 per GI recs  Discharge Diagnoses: Principal Problem:   Acute lower GI bleeding Active Problems:   Acute pulmonary embolism (HCC)   Chronic systolic CHF (congestive heart failure) (HCC)   History of CAD (coronary artery disease)   History of hemiarthroplasty of right hip   COPD (chronic obstructive pulmonary disease) (HCC)   GAD (generalized anxiety disorder)   Hypertension   Type 2 diabetes mellitus with complication, without long-term current use of insulin (HCC)   Chronic kidney disease, stage 3b (HCC)   DNR (do not resuscitate)   Acute blood loss anemia  Resolved Problems:   * No resolved hospital problems. *  Hospital Course: 82 y.o. 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 She was last admitted here from 9/20-24 with acute PT and acute on chronic systolic CHF with AKI.  She was discharged on Eliquis; EF was 20-25%.  She underwent R hip replacement on 9/3.   She reports that she has had all kinds of recent problems culminating with being diagnosed with PNA at her facility [facility].  She has been coughing with wheezing and feeling more SOB and fatigued for the last few days.  Staff noticed BRBPR with a BM and sent her in for evaluation.   Assessment and Plan: Lower GI Bleeding -Afebrile, no leukocytosis -GI consulted. Recs to cont to hold eliquis and start trial of heparin  -No further evidence of bleed this visit -GI has since signed off and recommended resuming eliquis and ASA on 10/10   Recent pulmonary embolism  -Admitted 9/20-24 for this  issue -Hold Eliquis, resume on 10/10   Chronic systolic CHF -05/21/23 echo with EF 20-25%, global hypokinesis, moderate MR, moderate pulm HTN -Volume overload has been a recurrent challenge for her -Appears to be compensated at this time clinically and also on CXR -02 saturation is 100% on 4 L/min per [place] - which appears to be at/near her baseline -Continue carvedilol plus hydralazine and isosorbide for afterload reduction.  -resume home meds on d/c   Stage 3b CKD -Appears to be stable at this time   History of CAD (coronary artery disease) -Status post stent angioplasty -Hold ASA in the setting of GI bleeding, resume on 10/10 per GI -Continue carvedilol and atorvastatin -denies chest pains   HTN -Continue hydralazine, carvedilol   History of hemiarthroplasty of right hip -Status post right hip hemiarthroplasty which was done on 05/03/23 for hip fracture -In SNF rehab - but her son reports that they haven't been getting her out of bed to go to the bathroom   COPD (chronic obstructive pulmonary disease) (HCC) -On 2-4 L home O2, currently on 4-5L -She does have a coarse cough, was diagnosed with PNA at her facility -CXR at time of presentation reviewed. Clear with cardiomegaly and pulm HTN -Cont neb tx as needed   GAD (generalized anxiety disorder) -Continue buspirone, Klonopin   DM -Recent A1c was 6.6   Hypokalemia -replaced   DNR -noted at time of presentation    Consultants: GI Procedures performed:  mouth 2 (two) times daily.   umeclidinium-vilanterol 62.5-25 MCG/ACT Aepb Commonly known as: ANORO ELLIPTA Inhale 1 puff into the lungs daily.   Vitamin D3 250 MCG (10000 UT) Tabs Take 10,000 Units by mouth daily at 6 (six) AM.        Follow-up Information     [name], MD Follow up in 2 week(s).   Specialty: Internal Medicine Why: Hospital follow up Contact information: [address] [place] [address] [phone]                Discharge Exam: Ceasar Mons Weights   06/05/23 1420 06/05/23 2035  Weight: 76 kg 76.8 kg   General exam: Awake, laying in bed, in nad Respiratory system: Normal respiratory effort, no wheezing Cardiovascular system: regular rate, s1, s2 Gastrointestinal system: Soft, nondistended, positive BS Central nervous system: CN2-12 grossly intact, strength intact Extremities: Perfused, no clubbing Skin: Normal skin turgor, no notable skin lesions seen Psychiatry: Mood normal // no visual hallucinations   Condition at discharge: fair  The results of significant diagnostics from this hospitalization (including imaging, microbiology, ancillary and laboratory) are listed below for  reference.   Imaging Studies: DG CHEST PORT 1 VIEW  Result Date: 06/05/2023 CLINICAL DATA:  Cough EXAM: PORTABLE CHEST 1 VIEW COMPARISON:  05/20/2023 FINDINGS: Lungs are clear. No pneumothorax or pleural effusion. Stable mild cardiomegaly. Hilar enlargement bilaterally relates to central pulmonary arterial enlargement better seen on CT examination 05/20/2023. No overt pulmonary edema. No acute bone abnormality. IMPRESSION: 1. No active disease. 2. Cardiomegaly and pulmonary arterial hypertension. Electronically Signed   By: Helyn Numbers M.D.   On: 06/05/2023 18:35   VAS [place] LOWER EXTREMITY VENOUS (DVT)  Result Date: 05/21/2023  Lower Venous DVT Study Patient Name:  Angela Lucero  Date of Exam:   05/21/2023 Medical Rec #: [id number]  Accession #:    [id number] Date of Birth: Oct 17, 1940  Patient Gender: F Patient Age:   66 years Exam Location:  [facility] Procedure:      VAS [place] LOWER EXTREMITY VENOUS (DVT) Referring Phys: Elwyn Lade AGBATA --------------------------------------------------------------------------------  Indications: Edema, and pulmonary embolism. Other             Recent right hip fracture with repair 05/03/23. COPD on home Indications:      oxygen. Risk Factors: Chronic CHF with low EF. Comparison Study: No prior study on file Performing Technologist: Sherren Kerns RVS  Examination Guidelines: A complete evaluation includes B-mode imaging, spectral Doppler, color Doppler, and power Doppler as needed of all accessible portions of each vessel. Bilateral testing is considered an integral part of a complete examination. Limited examinations for reoccurring indications may be performed as noted. The reflux portion of the exam is performed with the patient in reverse Trendelenburg.  +--------+---------------+---------+-----------+----------------+-------------+ RIGHT   CompressibilityPhasicitySpontaneityProperties      Thrombus                                                                  Aging         +--------+---------------+---------+-----------+----------------+-------------+ CFV     Full                               pulsatile  pulsatile                                                                waveforms                     +--------+---------------+---------+-----------+----------------+-------------+ PTV     Full                                                             +--------+---------------+---------+-----------+----------------+-------------+ PERO    Full                                                             +--------+---------------+---------+-----------+----------------+-------------+     Summary: RIGHT: - There is no evidence of deep vein thrombosis in the lower extremity.  - No cystic structure  found in the popliteal fossa. pulsatile waveforms  LEFT: - There is no evidence of deep vein thrombosis in the lower extremity.  - No cystic structure found in the popliteal fossa. Pulsatile waveforms.  *See table(s) above for measurements and observations. Electronically signed by Coral Else MD on 05/21/2023 at 8:40:43 PM.    Final    ECHOCARDIOGRAM LIMITED  Result Date: 05/21/2023    ECHOCARDIOGRAM LIMITED REPORT   Patient Name:   Angela Lucero  Date of Exam: 05/21/2023 Medical Rec #:  [id number]  Height:       66.0 in Accession #:    [id number] Weight:       169.5 lb Date of Birth:  12/05/1940  BSA:          1.864 m Patient Age:    81 years   BP:           109/59 mmHg Patient Gender: F          HR:           86 bpm. Exam Location:  Inpatient Procedure: Limited Echo, Cardiac Doppler and Limited Color Doppler Indications:    Pulmonary Embolus I26.09  History:        Patient has prior history of Echocardiogram examinations, most                 recent 05/01/2023. CHF, CAD and Previous Myocardial Infarction,                 P.E. and COPD, Mitral Valve Disease, Signs/Symptoms:Edema; Risk                 Factors:Diabetes, Hypertension and Dyslipidemia.  Sonographer:    Aron Baba Referring Phys: [id number] Dorothe Pea BRANCH IMPRESSIONS  1. Left ventricular ejection fraction, by estimation, is 20 to 25%. The left ventricle has severely decreased function. The left ventricle demonstrates global hypokinesis.  2. Right ventricular systolic function is moderately reduced. The right ventricular size is mildly enlarged. There is moderately elevated pulmonary artery systolic pressure. The estimated right ventricular systolic pressure is 52.9  mouth 2 (two) times daily.   umeclidinium-vilanterol 62.5-25 MCG/ACT Aepb Commonly known as: ANORO ELLIPTA Inhale 1 puff into the lungs daily.   Vitamin D3 250 MCG (10000 UT) Tabs Take 10,000 Units by mouth daily at 6 (six) AM.        Follow-up Information     [name], MD Follow up in 2 week(s).   Specialty: Internal Medicine Why: Hospital follow up Contact information: [address] [place] [address] [phone]                Discharge Exam: Ceasar Mons Weights   06/05/23 1420 06/05/23 2035  Weight: 76 kg 76.8 kg   General exam: Awake, laying in bed, in nad Respiratory system: Normal respiratory effort, no wheezing Cardiovascular system: regular rate, s1, s2 Gastrointestinal system: Soft, nondistended, positive BS Central nervous system: CN2-12 grossly intact, strength intact Extremities: Perfused, no clubbing Skin: Normal skin turgor, no notable skin lesions seen Psychiatry: Mood normal // no visual hallucinations   Condition at discharge: fair  The results of significant diagnostics from this hospitalization (including imaging, microbiology, ancillary and laboratory) are listed below for  reference.   Imaging Studies: DG CHEST PORT 1 VIEW  Result Date: 06/05/2023 CLINICAL DATA:  Cough EXAM: PORTABLE CHEST 1 VIEW COMPARISON:  05/20/2023 FINDINGS: Lungs are clear. No pneumothorax or pleural effusion. Stable mild cardiomegaly. Hilar enlargement bilaterally relates to central pulmonary arterial enlargement better seen on CT examination 05/20/2023. No overt pulmonary edema. No acute bone abnormality. IMPRESSION: 1. No active disease. 2. Cardiomegaly and pulmonary arterial hypertension. Electronically Signed   By: Helyn Numbers M.D.   On: 06/05/2023 18:35   VAS [place] LOWER EXTREMITY VENOUS (DVT)  Result Date: 05/21/2023  Lower Venous DVT Study Patient Name:  Angela Lucero  Date of Exam:   05/21/2023 Medical Rec #: [id number]  Accession #:    [id number] Date of Birth: Oct 17, 1940  Patient Gender: F Patient Age:   66 years Exam Location:  [facility] Procedure:      VAS [place] LOWER EXTREMITY VENOUS (DVT) Referring Phys: Elwyn Lade AGBATA --------------------------------------------------------------------------------  Indications: Edema, and pulmonary embolism. Other             Recent right hip fracture with repair 05/03/23. COPD on home Indications:      oxygen. Risk Factors: Chronic CHF with low EF. Comparison Study: No prior study on file Performing Technologist: Sherren Kerns RVS  Examination Guidelines: A complete evaluation includes B-mode imaging, spectral Doppler, color Doppler, and power Doppler as needed of all accessible portions of each vessel. Bilateral testing is considered an integral part of a complete examination. Limited examinations for reoccurring indications may be performed as noted. The reflux portion of the exam is performed with the patient in reverse Trendelenburg.  +--------+---------------+---------+-----------+----------------+-------------+ RIGHT   CompressibilityPhasicitySpontaneityProperties      Thrombus                                                                  Aging         +--------+---------------+---------+-----------+----------------+-------------+ CFV     Full                               pulsatile  pulsatile                                                                waveforms                     +--------+---------------+---------+-----------+----------------+-------------+ PTV     Full                                                             +--------+---------------+---------+-----------+----------------+-------------+ PERO    Full                                                             +--------+---------------+---------+-----------+----------------+-------------+     Summary: RIGHT: - There is no evidence of deep vein thrombosis in the lower extremity.  - No cystic structure  found in the popliteal fossa. pulsatile waveforms  LEFT: - There is no evidence of deep vein thrombosis in the lower extremity.  - No cystic structure found in the popliteal fossa. Pulsatile waveforms.  *See table(s) above for measurements and observations. Electronically signed by Coral Else MD on 05/21/2023 at 8:40:43 PM.    Final    ECHOCARDIOGRAM LIMITED  Result Date: 05/21/2023    ECHOCARDIOGRAM LIMITED REPORT   Patient Name:   Angela Lucero  Date of Exam: 05/21/2023 Medical Rec #:  [id number]  Height:       66.0 in Accession #:    [id number] Weight:       169.5 lb Date of Birth:  12/05/1940  BSA:          1.864 m Patient Age:    81 years   BP:           109/59 mmHg Patient Gender: F          HR:           86 bpm. Exam Location:  Inpatient Procedure: Limited Echo, Cardiac Doppler and Limited Color Doppler Indications:    Pulmonary Embolus I26.09  History:        Patient has prior history of Echocardiogram examinations, most                 recent 05/01/2023. CHF, CAD and Previous Myocardial Infarction,                 P.E. and COPD, Mitral Valve Disease, Signs/Symptoms:Edema; Risk                 Factors:Diabetes, Hypertension and Dyslipidemia.  Sonographer:    Aron Baba Referring Phys: [id number] Dorothe Pea BRANCH IMPRESSIONS  1. Left ventricular ejection fraction, by estimation, is 20 to 25%. The left ventricle has severely decreased function. The left ventricle demonstrates global hypokinesis.  2. Right ventricular systolic function is moderately reduced. The right ventricular size is mildly enlarged. There is moderately elevated pulmonary artery systolic pressure. The estimated right ventricular systolic pressure is 52.9  Physician Discharge Summary   Patient: Angela Lucero MRN: [id number] DOB: 21-Jan-1941  Admit date:     06/05/2023  Discharge date: 06/07/23  Discharge Physician: Rickey Barbara   PCP: [name], MD   Recommendations at discharge:    Follow up with PCP in 1-2 weeks Please continue to hold Eliquis and Aspirin, but resume them on 10/10 per GI recs  Discharge Diagnoses: Principal Problem:   Acute lower GI bleeding Active Problems:   Acute pulmonary embolism (HCC)   Chronic systolic CHF (congestive heart failure) (HCC)   History of CAD (coronary artery disease)   History of hemiarthroplasty of right hip   COPD (chronic obstructive pulmonary disease) (HCC)   GAD (generalized anxiety disorder)   Hypertension   Type 2 diabetes mellitus with complication, without long-term current use of insulin (HCC)   Chronic kidney disease, stage 3b (HCC)   DNR (do not resuscitate)   Acute blood loss anemia  Resolved Problems:   * No resolved hospital problems. *  Hospital Course: 82 y.o. 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 She was last admitted here from 9/20-24 with acute PT and acute on chronic systolic CHF with AKI.  She was discharged on Eliquis; EF was 20-25%.  She underwent R hip replacement on 9/3.   She reports that she has had all kinds of recent problems culminating with being diagnosed with PNA at her facility [facility].  She has been coughing with wheezing and feeling more SOB and fatigued for the last few days.  Staff noticed BRBPR with a BM and sent her in for evaluation.   Assessment and Plan: Lower GI Bleeding -Afebrile, no leukocytosis -GI consulted. Recs to cont to hold eliquis and start trial of heparin  -No further evidence of bleed this visit -GI has since signed off and recommended resuming eliquis and ASA on 10/10   Recent pulmonary embolism  -Admitted 9/20-24 for this  issue -Hold Eliquis, resume on 10/10   Chronic systolic CHF -05/21/23 echo with EF 20-25%, global hypokinesis, moderate MR, moderate pulm HTN -Volume overload has been a recurrent challenge for her -Appears to be compensated at this time clinically and also on CXR -02 saturation is 100% on 4 L/min per [place] - which appears to be at/near her baseline -Continue carvedilol plus hydralazine and isosorbide for afterload reduction.  -resume home meds on d/c   Stage 3b CKD -Appears to be stable at this time   History of CAD (coronary artery disease) -Status post stent angioplasty -Hold ASA in the setting of GI bleeding, resume on 10/10 per GI -Continue carvedilol and atorvastatin -denies chest pains   HTN -Continue hydralazine, carvedilol   History of hemiarthroplasty of right hip -Status post right hip hemiarthroplasty which was done on 05/03/23 for hip fracture -In SNF rehab - but her son reports that they haven't been getting her out of bed to go to the bathroom   COPD (chronic obstructive pulmonary disease) (HCC) -On 2-4 L home O2, currently on 4-5L -She does have a coarse cough, was diagnosed with PNA at her facility -CXR at time of presentation reviewed. Clear with cardiomegaly and pulm HTN -Cont neb tx as needed   GAD (generalized anxiety disorder) -Continue buspirone, Klonopin   DM -Recent A1c was 6.6   Hypokalemia -replaced   DNR -noted at time of presentation    Consultants: GI Procedures performed:  mouth 2 (two) times daily.   umeclidinium-vilanterol 62.5-25 MCG/ACT Aepb Commonly known as: ANORO ELLIPTA Inhale 1 puff into the lungs daily.   Vitamin D3 250 MCG (10000 UT) Tabs Take 10,000 Units by mouth daily at 6 (six) AM.        Follow-up Information     [name], MD Follow up in 2 week(s).   Specialty: Internal Medicine Why: Hospital follow up Contact information: [address] [place] [address] [phone]                Discharge Exam: Ceasar Mons Weights   06/05/23 1420 06/05/23 2035  Weight: 76 kg 76.8 kg   General exam: Awake, laying in bed, in nad Respiratory system: Normal respiratory effort, no wheezing Cardiovascular system: regular rate, s1, s2 Gastrointestinal system: Soft, nondistended, positive BS Central nervous system: CN2-12 grossly intact, strength intact Extremities: Perfused, no clubbing Skin: Normal skin turgor, no notable skin lesions seen Psychiatry: Mood normal // no visual hallucinations   Condition at discharge: fair  The results of significant diagnostics from this hospitalization (including imaging, microbiology, ancillary and laboratory) are listed below for  reference.   Imaging Studies: DG CHEST PORT 1 VIEW  Result Date: 06/05/2023 CLINICAL DATA:  Cough EXAM: PORTABLE CHEST 1 VIEW COMPARISON:  05/20/2023 FINDINGS: Lungs are clear. No pneumothorax or pleural effusion. Stable mild cardiomegaly. Hilar enlargement bilaterally relates to central pulmonary arterial enlargement better seen on CT examination 05/20/2023. No overt pulmonary edema. No acute bone abnormality. IMPRESSION: 1. No active disease. 2. Cardiomegaly and pulmonary arterial hypertension. Electronically Signed   By: Helyn Numbers M.D.   On: 06/05/2023 18:35   VAS [place] LOWER EXTREMITY VENOUS (DVT)  Result Date: 05/21/2023  Lower Venous DVT Study Patient Name:  Angela Lucero  Date of Exam:   05/21/2023 Medical Rec #: [id number]  Accession #:    [id number] Date of Birth: Oct 17, 1940  Patient Gender: F Patient Age:   66 years Exam Location:  [facility] Procedure:      VAS [place] LOWER EXTREMITY VENOUS (DVT) Referring Phys: Elwyn Lade AGBATA --------------------------------------------------------------------------------  Indications: Edema, and pulmonary embolism. Other             Recent right hip fracture with repair 05/03/23. COPD on home Indications:      oxygen. Risk Factors: Chronic CHF with low EF. Comparison Study: No prior study on file Performing Technologist: Sherren Kerns RVS  Examination Guidelines: A complete evaluation includes B-mode imaging, spectral Doppler, color Doppler, and power Doppler as needed of all accessible portions of each vessel. Bilateral testing is considered an integral part of a complete examination. Limited examinations for reoccurring indications may be performed as noted. The reflux portion of the exam is performed with the patient in reverse Trendelenburg.  +--------+---------------+---------+-----------+----------------+-------------+ RIGHT   CompressibilityPhasicitySpontaneityProperties      Thrombus                                                                  Aging         +--------+---------------+---------+-----------+----------------+-------------+ CFV     Full                               pulsatile  mouth 2 (two) times daily.   umeclidinium-vilanterol 62.5-25 MCG/ACT Aepb Commonly known as: ANORO ELLIPTA Inhale 1 puff into the lungs daily.   Vitamin D3 250 MCG (10000 UT) Tabs Take 10,000 Units by mouth daily at 6 (six) AM.        Follow-up Information     [name], MD Follow up in 2 week(s).   Specialty: Internal Medicine Why: Hospital follow up Contact information: [address] [place] [address] [phone]                Discharge Exam: Ceasar Mons Weights   06/05/23 1420 06/05/23 2035  Weight: 76 kg 76.8 kg   General exam: Awake, laying in bed, in nad Respiratory system: Normal respiratory effort, no wheezing Cardiovascular system: regular rate, s1, s2 Gastrointestinal system: Soft, nondistended, positive BS Central nervous system: CN2-12 grossly intact, strength intact Extremities: Perfused, no clubbing Skin: Normal skin turgor, no notable skin lesions seen Psychiatry: Mood normal // no visual hallucinations   Condition at discharge: fair  The results of significant diagnostics from this hospitalization (including imaging, microbiology, ancillary and laboratory) are listed below for  reference.   Imaging Studies: DG CHEST PORT 1 VIEW  Result Date: 06/05/2023 CLINICAL DATA:  Cough EXAM: PORTABLE CHEST 1 VIEW COMPARISON:  05/20/2023 FINDINGS: Lungs are clear. No pneumothorax or pleural effusion. Stable mild cardiomegaly. Hilar enlargement bilaterally relates to central pulmonary arterial enlargement better seen on CT examination 05/20/2023. No overt pulmonary edema. No acute bone abnormality. IMPRESSION: 1. No active disease. 2. Cardiomegaly and pulmonary arterial hypertension. Electronically Signed   By: Helyn Numbers M.D.   On: 06/05/2023 18:35   VAS [place] LOWER EXTREMITY VENOUS (DVT)  Result Date: 05/21/2023  Lower Venous DVT Study Patient Name:  Angela Lucero  Date of Exam:   05/21/2023 Medical Rec #: [id number]  Accession #:    [id number] Date of Birth: Oct 17, 1940  Patient Gender: F Patient Age:   66 years Exam Location:  [facility] Procedure:      VAS [place] LOWER EXTREMITY VENOUS (DVT) Referring Phys: Elwyn Lade AGBATA --------------------------------------------------------------------------------  Indications: Edema, and pulmonary embolism. Other             Recent right hip fracture with repair 05/03/23. COPD on home Indications:      oxygen. Risk Factors: Chronic CHF with low EF. Comparison Study: No prior study on file Performing Technologist: Sherren Kerns RVS  Examination Guidelines: A complete evaluation includes B-mode imaging, spectral Doppler, color Doppler, and power Doppler as needed of all accessible portions of each vessel. Bilateral testing is considered an integral part of a complete examination. Limited examinations for reoccurring indications may be performed as noted. The reflux portion of the exam is performed with the patient in reverse Trendelenburg.  +--------+---------------+---------+-----------+----------------+-------------+ RIGHT   CompressibilityPhasicitySpontaneityProperties      Thrombus                                                                  Aging         +--------+---------------+---------+-----------+----------------+-------------+ CFV     Full                               pulsatile  pulsatile                                                                waveforms                     +--------+---------------+---------+-----------+----------------+-------------+ PTV     Full                                                             +--------+---------------+---------+-----------+----------------+-------------+ PERO    Full                                                             +--------+---------------+---------+-----------+----------------+-------------+     Summary: RIGHT: - There is no evidence of deep vein thrombosis in the lower extremity.  - No cystic structure  found in the popliteal fossa. pulsatile waveforms  LEFT: - There is no evidence of deep vein thrombosis in the lower extremity.  - No cystic structure found in the popliteal fossa. Pulsatile waveforms.  *See table(s) above for measurements and observations. Electronically signed by Coral Else MD on 05/21/2023 at 8:40:43 PM.    Final    ECHOCARDIOGRAM LIMITED  Result Date: 05/21/2023    ECHOCARDIOGRAM LIMITED REPORT   Patient Name:   Angela Lucero  Date of Exam: 05/21/2023 Medical Rec #:  [id number]  Height:       66.0 in Accession #:    [id number] Weight:       169.5 lb Date of Birth:  12/05/1940  BSA:          1.864 m Patient Age:    81 years   BP:           109/59 mmHg Patient Gender: F          HR:           86 bpm. Exam Location:  Inpatient Procedure: Limited Echo, Cardiac Doppler and Limited Color Doppler Indications:    Pulmonary Embolus I26.09  History:        Patient has prior history of Echocardiogram examinations, most                 recent 05/01/2023. CHF, CAD and Previous Myocardial Infarction,                 P.E. and COPD, Mitral Valve Disease, Signs/Symptoms:Edema; Risk                 Factors:Diabetes, Hypertension and Dyslipidemia.  Sonographer:    Aron Baba Referring Phys: [id number] Dorothe Pea BRANCH IMPRESSIONS  1. Left ventricular ejection fraction, by estimation, is 20 to 25%. The left ventricle has severely decreased function. The left ventricle demonstrates global hypokinesis.  2. Right ventricular systolic function is moderately reduced. The right ventricular size is mildly enlarged. There is moderately elevated pulmonary artery systolic pressure. The estimated right ventricular systolic pressure is 52.9  Physician Discharge Summary   Patient: Angela Lucero MRN: [id number] DOB: 21-Jan-1941  Admit date:     06/05/2023  Discharge date: 06/07/23  Discharge Physician: Rickey Barbara   PCP: [name], MD   Recommendations at discharge:    Follow up with PCP in 1-2 weeks Please continue to hold Eliquis and Aspirin, but resume them on 10/10 per GI recs  Discharge Diagnoses: Principal Problem:   Acute lower GI bleeding Active Problems:   Acute pulmonary embolism (HCC)   Chronic systolic CHF (congestive heart failure) (HCC)   History of CAD (coronary artery disease)   History of hemiarthroplasty of right hip   COPD (chronic obstructive pulmonary disease) (HCC)   GAD (generalized anxiety disorder)   Hypertension   Type 2 diabetes mellitus with complication, without long-term current use of insulin (HCC)   Chronic kidney disease, stage 3b (HCC)   DNR (do not resuscitate)   Acute blood loss anemia  Resolved Problems:   * No resolved hospital problems. *  Hospital Course: 82 y.o. 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 She was last admitted here from 9/20-24 with acute PT and acute on chronic systolic CHF with AKI.  She was discharged on Eliquis; EF was 20-25%.  She underwent R hip replacement on 9/3.   She reports that she has had all kinds of recent problems culminating with being diagnosed with PNA at her facility [facility].  She has been coughing with wheezing and feeling more SOB and fatigued for the last few days.  Staff noticed BRBPR with a BM and sent her in for evaluation.   Assessment and Plan: Lower GI Bleeding -Afebrile, no leukocytosis -GI consulted. Recs to cont to hold eliquis and start trial of heparin  -No further evidence of bleed this visit -GI has since signed off and recommended resuming eliquis and ASA on 10/10   Recent pulmonary embolism  -Admitted 9/20-24 for this  issue -Hold Eliquis, resume on 10/10   Chronic systolic CHF -05/21/23 echo with EF 20-25%, global hypokinesis, moderate MR, moderate pulm HTN -Volume overload has been a recurrent challenge for her -Appears to be compensated at this time clinically and also on CXR -02 saturation is 100% on 4 L/min per [place] - which appears to be at/near her baseline -Continue carvedilol plus hydralazine and isosorbide for afterload reduction.  -resume home meds on d/c   Stage 3b CKD -Appears to be stable at this time   History of CAD (coronary artery disease) -Status post stent angioplasty -Hold ASA in the setting of GI bleeding, resume on 10/10 per GI -Continue carvedilol and atorvastatin -denies chest pains   HTN -Continue hydralazine, carvedilol   History of hemiarthroplasty of right hip -Status post right hip hemiarthroplasty which was done on 05/03/23 for hip fracture -In SNF rehab - but her son reports that they haven't been getting her out of bed to go to the bathroom   COPD (chronic obstructive pulmonary disease) (HCC) -On 2-4 L home O2, currently on 4-5L -She does have a coarse cough, was diagnosed with PNA at her facility -CXR at time of presentation reviewed. Clear with cardiomegaly and pulm HTN -Cont neb tx as needed   GAD (generalized anxiety disorder) -Continue buspirone, Klonopin   DM -Recent A1c was 6.6   Hypokalemia -replaced   DNR -noted at time of presentation    Consultants: GI Procedures performed:  mouth 2 (two) times daily.   umeclidinium-vilanterol 62.5-25 MCG/ACT Aepb Commonly known as: ANORO ELLIPTA Inhale 1 puff into the lungs daily.   Vitamin D3 250 MCG (10000 UT) Tabs Take 10,000 Units by mouth daily at 6 (six) AM.        Follow-up Information     [name], MD Follow up in 2 week(s).   Specialty: Internal Medicine Why: Hospital follow up Contact information: [address] [place] [address] [phone]                Discharge Exam: Ceasar Mons Weights   06/05/23 1420 06/05/23 2035  Weight: 76 kg 76.8 kg   General exam: Awake, laying in bed, in nad Respiratory system: Normal respiratory effort, no wheezing Cardiovascular system: regular rate, s1, s2 Gastrointestinal system: Soft, nondistended, positive BS Central nervous system: CN2-12 grossly intact, strength intact Extremities: Perfused, no clubbing Skin: Normal skin turgor, no notable skin lesions seen Psychiatry: Mood normal // no visual hallucinations   Condition at discharge: fair  The results of significant diagnostics from this hospitalization (including imaging, microbiology, ancillary and laboratory) are listed below for  reference.   Imaging Studies: DG CHEST PORT 1 VIEW  Result Date: 06/05/2023 CLINICAL DATA:  Cough EXAM: PORTABLE CHEST 1 VIEW COMPARISON:  05/20/2023 FINDINGS: Lungs are clear. No pneumothorax or pleural effusion. Stable mild cardiomegaly. Hilar enlargement bilaterally relates to central pulmonary arterial enlargement better seen on CT examination 05/20/2023. No overt pulmonary edema. No acute bone abnormality. IMPRESSION: 1. No active disease. 2. Cardiomegaly and pulmonary arterial hypertension. Electronically Signed   By: Helyn Numbers M.D.   On: 06/05/2023 18:35   VAS [place] LOWER EXTREMITY VENOUS (DVT)  Result Date: 05/21/2023  Lower Venous DVT Study Patient Name:  Angela Lucero  Date of Exam:   05/21/2023 Medical Rec #: [id number]  Accession #:    [id number] Date of Birth: Oct 17, 1940  Patient Gender: F Patient Age:   66 years Exam Location:  [facility] Procedure:      VAS [place] LOWER EXTREMITY VENOUS (DVT) Referring Phys: Elwyn Lade AGBATA --------------------------------------------------------------------------------  Indications: Edema, and pulmonary embolism. Other             Recent right hip fracture with repair 05/03/23. COPD on home Indications:      oxygen. Risk Factors: Chronic CHF with low EF. Comparison Study: No prior study on file Performing Technologist: Sherren Kerns RVS  Examination Guidelines: A complete evaluation includes B-mode imaging, spectral Doppler, color Doppler, and power Doppler as needed of all accessible portions of each vessel. Bilateral testing is considered an integral part of a complete examination. Limited examinations for reoccurring indications may be performed as noted. The reflux portion of the exam is performed with the patient in reverse Trendelenburg.  +--------+---------------+---------+-----------+----------------+-------------+ RIGHT   CompressibilityPhasicitySpontaneityProperties      Thrombus                                                                  Aging         +--------+---------------+---------+-----------+----------------+-------------+ CFV     Full                               pulsatile

## 2023-06-07 NOTE — Progress Notes (Signed)
PHARMACIST - PHYSICIAN COMMUNICATION DR:   Rhona Leavens CONCERNING: Antibiotic IV to Oral Route Change Policy  RECOMMENDATION: This patient is receiving azithromycin by the intravenous route.  Based on criteria approved by the Pharmacy and Therapeutics Committee, the antibiotic(s) is/are being converted to the equivalent oral dose form(s).   DESCRIPTION: These criteria include: Patient being treated for a respiratory tract infection, urinary tract infection, cellulitis or clostridium difficile associated diarrhea if on metronidazole The patient is not neutropenic and does not exhibit a GI malabsorption state The patient is eating (either orally or via tube) and/or has been taking other orally administered medications for a least 24 hours The patient is improving clinically and has a Tmax < 100.5  If you have questions about this conversion, please contact the Pharmacy Department.  Cindi Carbon, PharmD 06/07/23 7:55 AM

## 2023-06-07 NOTE — Evaluation (Signed)
Occupational Therapy Evaluation Patient Details Name: Angela Lucero MRN: [id number] DOB: 06-21-1941 Today's Date: 06/07/2023   History of Present Illness Patient is a 82 year old female who presented from SNF with rectal bleeding. Patient recently had right hip replacement on 9/3. Patient was admitted with lower GI bleed, PMH: pulmonary embolism 9/20-9/24, chronic systolic CHF, CKD III, CAD, COPD, generalized anxiety disorder,   Clinical Impression   Patient is a 82 year old female who was admitted for above. Patient reported having has multiple hospitalizations since June 6th. Patient was living at home independently prior level. Currently, patient has decreased standing balance, decreased awareness of posterior hip precautions/how to maintain them during ADLs and functional activity tolerance impacting participation in Adls.  Patient would continue to benefit from skilled OT services at this time while admitted and after d/c to address noted deficits in order to improve overall safety and independence in ADLs. Patient will benefit from continued inpatient follow up therapy, <3 hours/day.        If plan is discharge home, recommend the following: A little help with walking and/or transfers;A lot of help with bathing/dressing/bathroom;Assist for transportation;Assistance with cooking/housework;Help with stairs or ramp for entrance    Functional Status Assessment  Patient has had a recent decline in their functional status and demonstrates the ability to make significant improvements in function in a reasonable and predictable amount of time.  Equipment Recommendations  None recommended by OT       Precautions / Restrictions Precautions Precautions: Posterior Hip;Fall Precaution Booklet Issued: No Precaution Comments: pt able to recall when asked Restrictions Weight Bearing Restrictions: No RLE Weight Bearing: Weight bearing as tolerated      Mobility Bed Mobility Overal bed mobility:  Needs Assistance Bed Mobility: Supine to Sit     Supine to sit: Contact guard     General bed mobility comments: with cues to maintain hip precautions            Balance Overall balance assessment: Needs assistance Sitting-balance support: Bilateral upper extremity supported, Feet supported Sitting balance-Leahy Scale: Good     Standing balance support: Bilateral upper extremity supported, During functional activity, Reliant on assistive device for balance Standing balance-Leahy Scale: Poor         ADL either performed or assessed with clinical judgement   ADL Overall ADL's : Needs assistance/impaired Eating/Feeding: Independent;Bed level   Grooming: Wash/dry hands;Wash/dry face;Set up;Sitting   Upper Body Bathing: Contact guard assist;Sitting   Lower Body Bathing: Moderate assistance;Sitting/lateral leans   Upper Body Dressing : Minimal assistance;Sitting   Lower Body Dressing: Sit to/from stand;Maximal assistance   Toilet Transfer: Minimal assistance;Stand-pivot;Ambulation;Rolling walker (2 wheels) Toilet Transfer Details (indicate cue type and reason): with short O2 cord limiting participation. patient was quick to fatigue. Toileting- Clothing Manipulation and Hygiene: Maximal assistance;Sit to/from stand               Vision Baseline Vision/History: 1 Wears glasses Patient Visual Report: No change from baseline              Pertinent Vitals/Pain Pain Assessment Pain Assessment: Faces Faces Pain Scale: Hurts a little bit Pain Location: R hip pain Pain Descriptors / Indicators: Discomfort Pain Intervention(s): Repositioned, Premedicated before session     Extremity/Trunk Assessment Upper Extremity Assessment Upper Extremity Assessment: Overall WFL for tasks assessed   Lower Extremity Assessment Lower Extremity Assessment: Defer to PT evaluation   Cervical / Trunk Assessment Cervical / Trunk Assessment: Normal   Communication  Communication Communication: No  apparent difficulties   Cognition Arousal: Alert Behavior During Therapy: WFL for tasks assessed/performed Overall Cognitive Status: Within Functional Limits for tasks assessed                    Home Living Family/patient expects to be discharged to:: Private residence Living Arrangements: Alone Available Help at Discharge: Family;Friend(s);Available PRN/intermittently Type of Home: Mobile home Home Access: Stairs to enter Entrance Stairs-Number of Steps: 3 Entrance Stairs-Rails: Left Home Layout: One level     Bathroom Shower/Tub: Tub/shower unit;Walk-in shower   Bathroom Toilet: Standard Bathroom Accessibility: Yes How Accessible: Accessible via walker Home Equipment: Cane - single point;Rolling Walker (2 wheels);Shower seat;Grab bars - tub/shower   Additional Comments: pt was previously living alone but was d/c'd to [facility] on 9/9 following hospitalization from undergoing R THA      Prior Functioning/Environment Prior Level of Function : Needs assist             Mobility Comments: pt reports "I haven't walked yet." "I've only stood up." ADLs Comments: assist from staff at SNF        OT Problem List: Decreased activity tolerance;Impaired balance (sitting and/or standing)      OT Treatment/Interventions: Self-care/ADL training;Therapeutic activities;Patient/family education;Balance training;DME and/or AE instruction    OT Goals(Current goals can be found in the care plan section) Acute Rehab OT Goals Patient Stated Goal: to start therapy OT Goal Formulation: With patient Time For Goal Achievement: 06/21/23 Potential to Achieve Goals: Fair  OT Frequency: Min 1X/week    Co-evaluation   Reason for Co-Treatment: To address functional/ADL transfers PT goals addressed during session: Mobility/safety with mobility OT goals addressed during session: ADL's and self-care      AM-PAC OT "6 Clicks" Daily Activity      Outcome Measure Help from another person eating meals?: None Help from another person taking care of personal grooming?: A Little Help from another person toileting, which includes using toliet, bedpan, or urinal?: A Lot Help from another person bathing (including washing, rinsing, drying)?: A Lot Help from another person to put on and taking off regular upper body clothing?: A Little Help from another person to put on and taking off regular lower body clothing?: A Lot 6 Click Score: 16   End of Session Equipment Utilized During Treatment: Rolling walker (2 wheels);Oxygen;Gait belt Nurse Communication: Mobility status  Activity Tolerance: Patient limited by fatigue Patient left: with call bell/phone within reach;in chair;with chair alarm set  OT Visit Diagnosis: Unsteadiness on feet (R26.81);Muscle weakness (generalized) (M62.81)                Time: 7253-6644 OT Time Calculation (min): 21 min Charges:  OT General Charges $OT Visit: 1 Visit OT Evaluation $OT Eval Moderate Complexity: 1 Mod  Rorey Hodges OTR/L, MS Acute Rehabilitation Department Office# [phone]   Selinda Flavin 06/07/2023, 12:49 PM

## 2023-06-07 NOTE — Plan of Care (Addendum)
Patient a/ox4. BMx1 over night. No bright red blood seen. Stool soft, form and dark black. Patient c/o pain, anxiety and difficulty breathing. PRN given and nebs given as scheduled. Patient on 4L and 02 sats 95-100%.   Problem: Education: Goal: Knowledge of General Education information will improve Description: Including pain rating scale, medication(s)/side effects and non-pharmacologic comfort measures Outcome: Progressing   Problem: Health Behavior/Discharge Planning: Goal: Ability to manage health-related needs will improve Outcome: Progressing   Problem: Clinical Measurements: Goal: Ability to maintain clinical measurements within normal limits will improve Outcome: Progressing Goal: Will remain free from infection Outcome: Progressing Goal: Diagnostic test results will improve Outcome: Progressing Goal: Respiratory complications will improve Outcome: Progressing Goal: Cardiovascular complication will be avoided Outcome: Progressing   Problem: Activity: Goal: Risk for activity intolerance will decrease Outcome: Progressing   Problem: Pain Managment: Goal: General experience of comfort will improve Outcome: Progressing   Problem: Safety: Goal: Ability to remain free from injury will improve Outcome: Progressing

## 2023-06-08 DIAGNOSIS — K922 Gastrointestinal hemorrhage, unspecified: Secondary | ICD-10-CM | POA: Diagnosis not present

## 2023-06-08 DIAGNOSIS — Z96641 Presence of right artificial hip joint: Secondary | ICD-10-CM | POA: Diagnosis not present

## 2023-06-08 DIAGNOSIS — R262 Difficulty in walking, not elsewhere classified: Secondary | ICD-10-CM | POA: Diagnosis not present

## 2023-06-08 DIAGNOSIS — I5022 Chronic systolic (congestive) heart failure: Secondary | ICD-10-CM | POA: Diagnosis not present

## 2023-06-13 DIAGNOSIS — K922 Gastrointestinal hemorrhage, unspecified: Secondary | ICD-10-CM | POA: Diagnosis not present

## 2023-06-13 DIAGNOSIS — I2699 Other pulmonary embolism without acute cor pulmonale: Secondary | ICD-10-CM | POA: Diagnosis not present

## 2023-06-13 DIAGNOSIS — R5381 Other malaise: Secondary | ICD-10-CM | POA: Diagnosis not present

## 2023-06-13 DIAGNOSIS — I5022 Chronic systolic (congestive) heart failure: Secondary | ICD-10-CM | POA: Diagnosis not present

## 2023-06-15 DIAGNOSIS — R5381 Other malaise: Secondary | ICD-10-CM | POA: Diagnosis not present

## 2023-06-15 DIAGNOSIS — F419 Anxiety disorder, unspecified: Secondary | ICD-10-CM | POA: Diagnosis not present

## 2023-06-15 DIAGNOSIS — I5022 Chronic systolic (congestive) heart failure: Secondary | ICD-10-CM | POA: Diagnosis not present

## 2023-06-22 DIAGNOSIS — I5022 Chronic systolic (congestive) heart failure: Secondary | ICD-10-CM | POA: Diagnosis not present

## 2023-06-27 DIAGNOSIS — I5023 Acute on chronic systolic (congestive) heart failure: Secondary | ICD-10-CM | POA: Diagnosis not present

## 2023-06-27 DIAGNOSIS — R0689 Other abnormalities of breathing: Secondary | ICD-10-CM | POA: Diagnosis not present

## 2023-06-29 DIAGNOSIS — I5022 Chronic systolic (congestive) heart failure: Secondary | ICD-10-CM | POA: Diagnosis not present

## 2023-07-01 ENCOUNTER — Other Ambulatory Visit: Payer: Self-pay | Admitting: Internal Medicine

## 2023-07-01 DIAGNOSIS — Z96641 Presence of right artificial hip joint: Secondary | ICD-10-CM | POA: Diagnosis not present

## 2023-07-01 DIAGNOSIS — J449 Chronic obstructive pulmonary disease, unspecified: Secondary | ICD-10-CM | POA: Diagnosis not present

## 2023-07-01 DIAGNOSIS — R5381 Other malaise: Secondary | ICD-10-CM | POA: Diagnosis not present

## 2023-07-01 DIAGNOSIS — I5022 Chronic systolic (congestive) heart failure: Secondary | ICD-10-CM | POA: Diagnosis not present

## 2023-07-05 DIAGNOSIS — F4320 Adjustment disorder, unspecified: Secondary | ICD-10-CM | POA: Diagnosis not present

## 2023-07-05 DIAGNOSIS — F419 Anxiety disorder, unspecified: Secondary | ICD-10-CM | POA: Diagnosis not present

## 2023-07-06 DIAGNOSIS — I340 Nonrheumatic mitral (valve) insufficiency: Secondary | ICD-10-CM | POA: Diagnosis not present

## 2023-07-06 DIAGNOSIS — F419 Anxiety disorder, unspecified: Secondary | ICD-10-CM | POA: Diagnosis not present

## 2023-07-06 DIAGNOSIS — I2510 Atherosclerotic heart disease of native coronary artery without angina pectoris: Secondary | ICD-10-CM | POA: Diagnosis not present

## 2023-07-06 DIAGNOSIS — R1310 Dysphagia, unspecified: Secondary | ICD-10-CM | POA: Diagnosis not present

## 2023-07-06 DIAGNOSIS — E119 Type 2 diabetes mellitus without complications: Secondary | ICD-10-CM | POA: Diagnosis not present

## 2023-07-06 DIAGNOSIS — J449 Chronic obstructive pulmonary disease, unspecified: Secondary | ICD-10-CM | POA: Diagnosis not present

## 2023-07-06 DIAGNOSIS — I10 Essential (primary) hypertension: Secondary | ICD-10-CM | POA: Diagnosis not present

## 2023-07-06 DIAGNOSIS — I509 Heart failure, unspecified: Secondary | ICD-10-CM | POA: Diagnosis not present

## 2023-07-06 DIAGNOSIS — R339 Retention of urine, unspecified: Secondary | ICD-10-CM | POA: Diagnosis not present

## 2023-07-06 DIAGNOSIS — N1832 Chronic kidney disease, stage 3b: Secondary | ICD-10-CM | POA: Diagnosis not present

## 2023-07-06 DIAGNOSIS — F4320 Adjustment disorder, unspecified: Secondary | ICD-10-CM | POA: Diagnosis not present

## 2023-07-07 DIAGNOSIS — Z7901 Long term (current) use of anticoagulants: Secondary | ICD-10-CM | POA: Diagnosis not present

## 2023-07-07 DIAGNOSIS — I5023 Acute on chronic systolic (congestive) heart failure: Secondary | ICD-10-CM | POA: Diagnosis not present

## 2023-07-07 DIAGNOSIS — Z7951 Long term (current) use of inhaled steroids: Secondary | ICD-10-CM | POA: Diagnosis not present

## 2023-07-07 DIAGNOSIS — K922 Gastrointestinal hemorrhage, unspecified: Secondary | ICD-10-CM | POA: Diagnosis not present

## 2023-07-07 DIAGNOSIS — J441 Chronic obstructive pulmonary disease with (acute) exacerbation: Secondary | ICD-10-CM | POA: Diagnosis not present

## 2023-07-07 DIAGNOSIS — J440 Chronic obstructive pulmonary disease with acute lower respiratory infection: Secondary | ICD-10-CM | POA: Diagnosis not present

## 2023-07-07 DIAGNOSIS — R32 Unspecified urinary incontinence: Secondary | ICD-10-CM | POA: Diagnosis not present

## 2023-07-07 DIAGNOSIS — I2510 Atherosclerotic heart disease of native coronary artery without angina pectoris: Secondary | ICD-10-CM | POA: Diagnosis not present

## 2023-07-07 DIAGNOSIS — I340 Nonrheumatic mitral (valve) insufficiency: Secondary | ICD-10-CM | POA: Diagnosis not present

## 2023-07-07 DIAGNOSIS — I2699 Other pulmonary embolism without acute cor pulmonale: Secondary | ICD-10-CM | POA: Diagnosis not present

## 2023-07-07 DIAGNOSIS — J9610 Chronic respiratory failure, unspecified whether with hypoxia or hypercapnia: Secondary | ICD-10-CM | POA: Diagnosis not present

## 2023-07-07 DIAGNOSIS — J189 Pneumonia, unspecified organism: Secondary | ICD-10-CM | POA: Diagnosis not present

## 2023-07-07 DIAGNOSIS — Z556 Problems related to health literacy: Secondary | ICD-10-CM | POA: Diagnosis not present

## 2023-07-07 DIAGNOSIS — F419 Anxiety disorder, unspecified: Secondary | ICD-10-CM | POA: Diagnosis not present

## 2023-07-07 DIAGNOSIS — E1122 Type 2 diabetes mellitus with diabetic chronic kidney disease: Secondary | ICD-10-CM | POA: Diagnosis not present

## 2023-07-07 DIAGNOSIS — Z7982 Long term (current) use of aspirin: Secondary | ICD-10-CM | POA: Diagnosis not present

## 2023-07-07 DIAGNOSIS — Z9181 History of falling: Secondary | ICD-10-CM | POA: Diagnosis not present

## 2023-07-07 DIAGNOSIS — K589 Irritable bowel syndrome without diarrhea: Secondary | ICD-10-CM | POA: Diagnosis not present

## 2023-07-07 DIAGNOSIS — Z9981 Dependence on supplemental oxygen: Secondary | ICD-10-CM | POA: Diagnosis not present

## 2023-07-07 DIAGNOSIS — I110 Hypertensive heart disease with heart failure: Secondary | ICD-10-CM | POA: Diagnosis not present

## 2023-07-07 DIAGNOSIS — N1832 Chronic kidney disease, stage 3b: Secondary | ICD-10-CM | POA: Diagnosis not present

## 2023-07-08 DIAGNOSIS — I110 Hypertensive heart disease with heart failure: Secondary | ICD-10-CM | POA: Diagnosis not present

## 2023-07-08 DIAGNOSIS — I5023 Acute on chronic systolic (congestive) heart failure: Secondary | ICD-10-CM | POA: Diagnosis not present

## 2023-07-08 DIAGNOSIS — J189 Pneumonia, unspecified organism: Secondary | ICD-10-CM | POA: Diagnosis not present

## 2023-07-08 DIAGNOSIS — E1122 Type 2 diabetes mellitus with diabetic chronic kidney disease: Secondary | ICD-10-CM | POA: Diagnosis not present

## 2023-07-08 DIAGNOSIS — J441 Chronic obstructive pulmonary disease with (acute) exacerbation: Secondary | ICD-10-CM | POA: Diagnosis not present

## 2023-07-08 DIAGNOSIS — N1832 Chronic kidney disease, stage 3b: Secondary | ICD-10-CM | POA: Diagnosis not present

## 2023-07-10 DIAGNOSIS — N390 Urinary tract infection, site not specified: Secondary | ICD-10-CM | POA: Diagnosis not present

## 2023-07-11 DIAGNOSIS — I110 Hypertensive heart disease with heart failure: Secondary | ICD-10-CM | POA: Diagnosis not present

## 2023-07-11 DIAGNOSIS — I5023 Acute on chronic systolic (congestive) heart failure: Secondary | ICD-10-CM | POA: Diagnosis not present

## 2023-07-11 DIAGNOSIS — J189 Pneumonia, unspecified organism: Secondary | ICD-10-CM | POA: Diagnosis not present

## 2023-07-11 DIAGNOSIS — N1832 Chronic kidney disease, stage 3b: Secondary | ICD-10-CM | POA: Diagnosis not present

## 2023-07-11 DIAGNOSIS — E1122 Type 2 diabetes mellitus with diabetic chronic kidney disease: Secondary | ICD-10-CM | POA: Diagnosis not present

## 2023-07-11 DIAGNOSIS — J441 Chronic obstructive pulmonary disease with (acute) exacerbation: Secondary | ICD-10-CM | POA: Diagnosis not present

## 2023-07-12 DIAGNOSIS — E559 Vitamin D deficiency, unspecified: Secondary | ICD-10-CM | POA: Diagnosis not present

## 2023-07-12 DIAGNOSIS — Z111 Encounter for screening for respiratory tuberculosis: Secondary | ICD-10-CM | POA: Diagnosis not present

## 2023-07-12 DIAGNOSIS — D519 Vitamin B12 deficiency anemia, unspecified: Secondary | ICD-10-CM | POA: Diagnosis not present

## 2023-07-12 DIAGNOSIS — E782 Mixed hyperlipidemia: Secondary | ICD-10-CM | POA: Diagnosis not present

## 2023-07-12 DIAGNOSIS — E119 Type 2 diabetes mellitus without complications: Secondary | ICD-10-CM | POA: Diagnosis not present

## 2023-07-12 DIAGNOSIS — E038 Other specified hypothyroidism: Secondary | ICD-10-CM | POA: Diagnosis not present

## 2023-07-12 DIAGNOSIS — Z79899 Other long term (current) drug therapy: Secondary | ICD-10-CM | POA: Diagnosis not present

## 2023-07-13 DIAGNOSIS — N1832 Chronic kidney disease, stage 3b: Secondary | ICD-10-CM | POA: Diagnosis not present

## 2023-07-13 DIAGNOSIS — Z7982 Long term (current) use of aspirin: Secondary | ICD-10-CM | POA: Diagnosis not present

## 2023-07-13 DIAGNOSIS — I110 Hypertensive heart disease with heart failure: Secondary | ICD-10-CM | POA: Diagnosis not present

## 2023-07-13 DIAGNOSIS — J441 Chronic obstructive pulmonary disease with (acute) exacerbation: Secondary | ICD-10-CM | POA: Diagnosis not present

## 2023-07-13 DIAGNOSIS — I5023 Acute on chronic systolic (congestive) heart failure: Secondary | ICD-10-CM | POA: Diagnosis not present

## 2023-07-13 DIAGNOSIS — R591 Generalized enlarged lymph nodes: Secondary | ICD-10-CM | POA: Diagnosis not present

## 2023-07-13 DIAGNOSIS — I868 Varicose veins of other specified sites: Secondary | ICD-10-CM | POA: Diagnosis not present

## 2023-07-13 DIAGNOSIS — I272 Pulmonary hypertension, unspecified: Secondary | ICD-10-CM | POA: Diagnosis not present

## 2023-07-13 DIAGNOSIS — I959 Hypotension, unspecified: Secondary | ICD-10-CM | POA: Diagnosis not present

## 2023-07-13 DIAGNOSIS — Z743 Need for continuous supervision: Secondary | ICD-10-CM | POA: Diagnosis not present

## 2023-07-13 DIAGNOSIS — F411 Generalized anxiety disorder: Secondary | ICD-10-CM | POA: Diagnosis not present

## 2023-07-13 DIAGNOSIS — K922 Gastrointestinal hemorrhage, unspecified: Secondary | ICD-10-CM | POA: Diagnosis not present

## 2023-07-13 DIAGNOSIS — E119 Type 2 diabetes mellitus without complications: Secondary | ICD-10-CM | POA: Diagnosis not present

## 2023-07-13 DIAGNOSIS — Z515 Encounter for palliative care: Secondary | ICD-10-CM | POA: Diagnosis not present

## 2023-07-13 DIAGNOSIS — E1122 Type 2 diabetes mellitus with diabetic chronic kidney disease: Secondary | ICD-10-CM | POA: Diagnosis not present

## 2023-07-13 DIAGNOSIS — E785 Hyperlipidemia, unspecified: Secondary | ICD-10-CM | POA: Diagnosis not present

## 2023-07-13 DIAGNOSIS — R0989 Other specified symptoms and signs involving the circulatory and respiratory systems: Secondary | ICD-10-CM | POA: Diagnosis not present

## 2023-07-13 DIAGNOSIS — Z86711 Personal history of pulmonary embolism: Secondary | ICD-10-CM | POA: Diagnosis not present

## 2023-07-13 DIAGNOSIS — Z7901 Long term (current) use of anticoagulants: Secondary | ICD-10-CM | POA: Diagnosis not present

## 2023-07-13 DIAGNOSIS — I4510 Unspecified right bundle-branch block: Secondary | ICD-10-CM | POA: Diagnosis not present

## 2023-07-13 DIAGNOSIS — R079 Chest pain, unspecified: Secondary | ICD-10-CM | POA: Diagnosis not present

## 2023-07-13 DIAGNOSIS — J189 Pneumonia, unspecified organism: Secondary | ICD-10-CM | POA: Diagnosis not present

## 2023-07-13 DIAGNOSIS — N179 Acute kidney failure, unspecified: Secondary | ICD-10-CM | POA: Diagnosis not present

## 2023-07-13 DIAGNOSIS — K5730 Diverticulosis of large intestine without perforation or abscess without bleeding: Secondary | ICD-10-CM | POA: Diagnosis not present

## 2023-07-13 DIAGNOSIS — Z7984 Long term (current) use of oral hypoglycemic drugs: Secondary | ICD-10-CM | POA: Diagnosis not present

## 2023-07-13 DIAGNOSIS — I252 Old myocardial infarction: Secondary | ICD-10-CM | POA: Diagnosis not present

## 2023-07-13 DIAGNOSIS — I130 Hypertensive heart and chronic kidney disease with heart failure and stage 1 through stage 4 chronic kidney disease, or unspecified chronic kidney disease: Secondary | ICD-10-CM | POA: Diagnosis not present

## 2023-07-13 DIAGNOSIS — I4430 Unspecified atrioventricular block: Secondary | ICD-10-CM | POA: Diagnosis not present

## 2023-07-13 DIAGNOSIS — I25111 Atherosclerotic heart disease of native coronary artery with angina pectoris with documented spasm: Secondary | ICD-10-CM | POA: Diagnosis not present

## 2023-07-13 DIAGNOSIS — R918 Other nonspecific abnormal finding of lung field: Secondary | ICD-10-CM | POA: Diagnosis not present

## 2023-07-13 DIAGNOSIS — I509 Heart failure, unspecified: Secondary | ICD-10-CM | POA: Diagnosis not present

## 2023-07-13 DIAGNOSIS — J9611 Chronic respiratory failure with hypoxia: Secondary | ICD-10-CM | POA: Diagnosis not present

## 2023-07-13 DIAGNOSIS — I440 Atrioventricular block, first degree: Secondary | ICD-10-CM | POA: Diagnosis not present

## 2023-07-13 DIAGNOSIS — J449 Chronic obstructive pulmonary disease, unspecified: Secondary | ICD-10-CM | POA: Diagnosis not present

## 2023-07-13 DIAGNOSIS — I5032 Chronic diastolic (congestive) heart failure: Secondary | ICD-10-CM | POA: Diagnosis not present

## 2023-07-13 DIAGNOSIS — I2510 Atherosclerotic heart disease of native coronary artery without angina pectoris: Secondary | ICD-10-CM | POA: Diagnosis not present

## 2023-07-13 DIAGNOSIS — R935 Abnormal findings on diagnostic imaging of other abdominal regions, including retroperitoneum: Secondary | ICD-10-CM | POA: Diagnosis not present

## 2023-07-13 DIAGNOSIS — Z96641 Presence of right artificial hip joint: Secondary | ICD-10-CM | POA: Diagnosis not present

## 2023-07-13 DIAGNOSIS — D62 Acute posthemorrhagic anemia: Secondary | ICD-10-CM | POA: Diagnosis not present

## 2023-07-13 DIAGNOSIS — R0789 Other chest pain: Secondary | ICD-10-CM | POA: Diagnosis not present

## 2023-07-13 DIAGNOSIS — Z66 Do not resuscitate: Secondary | ICD-10-CM | POA: Diagnosis not present

## 2023-07-13 DIAGNOSIS — I081 Rheumatic disorders of both mitral and tricuspid valves: Secondary | ICD-10-CM | POA: Diagnosis not present

## 2023-07-13 DIAGNOSIS — Z794 Long term (current) use of insulin: Secondary | ICD-10-CM | POA: Diagnosis not present

## 2023-07-13 DIAGNOSIS — D631 Anemia in chronic kidney disease: Secondary | ICD-10-CM | POA: Diagnosis not present

## 2023-07-13 DIAGNOSIS — M1380 Other specified arthritis, unspecified site: Secondary | ICD-10-CM | POA: Diagnosis not present

## 2023-07-13 DIAGNOSIS — M109 Gout, unspecified: Secondary | ICD-10-CM | POA: Diagnosis not present

## 2023-07-13 DIAGNOSIS — Q272 Other congenital malformations of renal artery: Secondary | ICD-10-CM | POA: Diagnosis not present

## 2023-07-13 DIAGNOSIS — F17200 Nicotine dependence, unspecified, uncomplicated: Secondary | ICD-10-CM | POA: Diagnosis not present

## 2023-07-13 DIAGNOSIS — I498 Other specified cardiac arrhythmias: Secondary | ICD-10-CM | POA: Diagnosis not present

## 2023-07-13 DIAGNOSIS — I452 Bifascicular block: Secondary | ICD-10-CM | POA: Diagnosis not present

## 2023-07-13 DIAGNOSIS — I499 Cardiac arrhythmia, unspecified: Secondary | ICD-10-CM | POA: Diagnosis not present

## 2023-07-14 DIAGNOSIS — I440 Atrioventricular block, first degree: Secondary | ICD-10-CM | POA: Diagnosis not present

## 2023-07-14 DIAGNOSIS — I498 Other specified cardiac arrhythmias: Secondary | ICD-10-CM | POA: Diagnosis not present

## 2023-07-14 DIAGNOSIS — I452 Bifascicular block: Secondary | ICD-10-CM | POA: Diagnosis not present

## 2023-07-19 DIAGNOSIS — F4320 Adjustment disorder, unspecified: Secondary | ICD-10-CM | POA: Diagnosis not present

## 2023-07-19 DIAGNOSIS — Z79899 Other long term (current) drug therapy: Secondary | ICD-10-CM | POA: Diagnosis not present

## 2023-07-19 DIAGNOSIS — D519 Vitamin B12 deficiency anemia, unspecified: Secondary | ICD-10-CM | POA: Diagnosis not present

## 2023-07-19 DIAGNOSIS — E119 Type 2 diabetes mellitus without complications: Secondary | ICD-10-CM | POA: Diagnosis not present

## 2023-07-19 DIAGNOSIS — F419 Anxiety disorder, unspecified: Secondary | ICD-10-CM | POA: Diagnosis not present

## 2023-07-19 DIAGNOSIS — E782 Mixed hyperlipidemia: Secondary | ICD-10-CM | POA: Diagnosis not present

## 2023-07-20 DIAGNOSIS — D508 Other iron deficiency anemias: Secondary | ICD-10-CM | POA: Diagnosis not present

## 2023-07-20 DIAGNOSIS — I10 Essential (primary) hypertension: Secondary | ICD-10-CM | POA: Diagnosis not present

## 2023-07-20 DIAGNOSIS — R1310 Dysphagia, unspecified: Secondary | ICD-10-CM | POA: Diagnosis not present

## 2023-07-20 DIAGNOSIS — F419 Anxiety disorder, unspecified: Secondary | ICD-10-CM | POA: Diagnosis not present

## 2023-07-20 DIAGNOSIS — N1832 Chronic kidney disease, stage 3b: Secondary | ICD-10-CM | POA: Diagnosis not present

## 2023-07-20 DIAGNOSIS — E785 Hyperlipidemia, unspecified: Secondary | ICD-10-CM | POA: Diagnosis not present

## 2023-07-20 DIAGNOSIS — J449 Chronic obstructive pulmonary disease, unspecified: Secondary | ICD-10-CM | POA: Diagnosis not present

## 2023-07-20 DIAGNOSIS — E119 Type 2 diabetes mellitus without complications: Secondary | ICD-10-CM | POA: Diagnosis not present

## 2023-07-20 DIAGNOSIS — I2510 Atherosclerotic heart disease of native coronary artery without angina pectoris: Secondary | ICD-10-CM | POA: Diagnosis not present

## 2023-07-20 DIAGNOSIS — I509 Heart failure, unspecified: Secondary | ICD-10-CM | POA: Diagnosis not present

## 2023-07-22 ENCOUNTER — Other Ambulatory Visit: Payer: Self-pay | Admitting: Internal Medicine

## 2023-07-25 DIAGNOSIS — E119 Type 2 diabetes mellitus without complications: Secondary | ICD-10-CM | POA: Diagnosis not present

## 2023-07-25 DIAGNOSIS — E782 Mixed hyperlipidemia: Secondary | ICD-10-CM | POA: Diagnosis not present

## 2023-07-25 DIAGNOSIS — E038 Other specified hypothyroidism: Secondary | ICD-10-CM | POA: Diagnosis not present

## 2023-07-25 DIAGNOSIS — D508 Other iron deficiency anemias: Secondary | ICD-10-CM | POA: Diagnosis not present

## 2023-07-25 DIAGNOSIS — D519 Vitamin B12 deficiency anemia, unspecified: Secondary | ICD-10-CM | POA: Diagnosis not present

## 2023-07-25 DIAGNOSIS — I10 Essential (primary) hypertension: Secondary | ICD-10-CM | POA: Diagnosis not present

## 2023-07-25 DIAGNOSIS — I509 Heart failure, unspecified: Secondary | ICD-10-CM | POA: Diagnosis not present

## 2023-07-25 DIAGNOSIS — J449 Chronic obstructive pulmonary disease, unspecified: Secondary | ICD-10-CM | POA: Diagnosis not present

## 2023-07-25 DIAGNOSIS — I2510 Atherosclerotic heart disease of native coronary artery without angina pectoris: Secondary | ICD-10-CM | POA: Diagnosis not present

## 2023-07-25 DIAGNOSIS — E559 Vitamin D deficiency, unspecified: Secondary | ICD-10-CM | POA: Diagnosis not present

## 2023-07-31 DIAGNOSIS — M1380 Other specified arthritis, unspecified site: Secondary | ICD-10-CM | POA: Diagnosis not present

## 2023-07-31 DIAGNOSIS — Z86711 Personal history of pulmonary embolism: Secondary | ICD-10-CM | POA: Diagnosis not present

## 2023-07-31 DIAGNOSIS — E119 Type 2 diabetes mellitus without complications: Secondary | ICD-10-CM | POA: Diagnosis not present

## 2023-07-31 DIAGNOSIS — M109 Gout, unspecified: Secondary | ICD-10-CM | POA: Diagnosis not present

## 2023-07-31 DIAGNOSIS — N1832 Chronic kidney disease, stage 3b: Secondary | ICD-10-CM | POA: Diagnosis not present

## 2023-07-31 DIAGNOSIS — Z66 Do not resuscitate: Secondary | ICD-10-CM | POA: Diagnosis not present

## 2023-07-31 DIAGNOSIS — I130 Hypertensive heart and chronic kidney disease with heart failure and stage 1 through stage 4 chronic kidney disease, or unspecified chronic kidney disease: Secondary | ICD-10-CM | POA: Diagnosis not present

## 2023-07-31 DIAGNOSIS — Z515 Encounter for palliative care: Secondary | ICD-10-CM | POA: Diagnosis not present

## 2023-07-31 DIAGNOSIS — J449 Chronic obstructive pulmonary disease, unspecified: Secondary | ICD-10-CM | POA: Diagnosis not present

## 2023-07-31 DIAGNOSIS — I5032 Chronic diastolic (congestive) heart failure: Secondary | ICD-10-CM | POA: Diagnosis not present

## 2023-07-31 DIAGNOSIS — I25111 Atherosclerotic heart disease of native coronary artery with angina pectoris with documented spasm: Secondary | ICD-10-CM | POA: Diagnosis not present

## 2023-08-02 DIAGNOSIS — Z111 Encounter for screening for respiratory tuberculosis: Secondary | ICD-10-CM | POA: Diagnosis not present
